# Patient Record
Sex: Male | Born: 1935 | Race: White | Hispanic: No | Marital: Married | Smoking: Never smoker
Health system: Southern US, Community
[De-identification: ages and names within clinical notes are randomized; demographics above are authoritative.]

## PROBLEM LIST (undated history)

## (undated) DIAGNOSIS — F329 Major depressive disorder, single episode, unspecified: Secondary | ICD-10-CM

## (undated) DIAGNOSIS — R81 Glycosuria: Secondary | ICD-10-CM

## (undated) DIAGNOSIS — G250 Essential tremor: Secondary | ICD-10-CM

## (undated) DIAGNOSIS — R413 Other amnesia: Secondary | ICD-10-CM

## (undated) DIAGNOSIS — I619 Nontraumatic intracerebral hemorrhage, unspecified: Secondary | ICD-10-CM

## (undated) DIAGNOSIS — F32A Depression, unspecified: Secondary | ICD-10-CM

## (undated) DIAGNOSIS — M5416 Radiculopathy, lumbar region: Secondary | ICD-10-CM

## (undated) DIAGNOSIS — R8290 Unspecified abnormal findings in urine: Secondary | ICD-10-CM

## (undated) DIAGNOSIS — R269 Unspecified abnormalities of gait and mobility: Secondary | ICD-10-CM

## (undated) DIAGNOSIS — M1990 Unspecified osteoarthritis, unspecified site: Secondary | ICD-10-CM

## (undated) DIAGNOSIS — F0390 Unspecified dementia without behavioral disturbance: Secondary | ICD-10-CM

## (undated) DIAGNOSIS — I739 Peripheral vascular disease, unspecified: Secondary | ICD-10-CM

## (undated) DIAGNOSIS — E785 Hyperlipidemia, unspecified: Secondary | ICD-10-CM

## (undated) DIAGNOSIS — H4920 Sixth [abducent] nerve palsy, unspecified eye: Secondary | ICD-10-CM

## (undated) DIAGNOSIS — H532 Diplopia: Secondary | ICD-10-CM

## (undated) DIAGNOSIS — M47812 Spondylosis without myelopathy or radiculopathy, cervical region: Secondary | ICD-10-CM

## (undated) DIAGNOSIS — E119 Type 2 diabetes mellitus without complications: Secondary | ICD-10-CM

## (undated) DIAGNOSIS — I10 Essential (primary) hypertension: Secondary | ICD-10-CM

## (undated) DIAGNOSIS — G459 Transient cerebral ischemic attack, unspecified: Secondary | ICD-10-CM

## (undated) DIAGNOSIS — H9190 Unspecified hearing loss, unspecified ear: Secondary | ICD-10-CM

## (undated) DIAGNOSIS — K219 Gastro-esophageal reflux disease without esophagitis: Secondary | ICD-10-CM

## (undated) DIAGNOSIS — M47816 Spondylosis without myelopathy or radiculopathy, lumbar region: Secondary | ICD-10-CM

## (undated) DIAGNOSIS — R296 Repeated falls: Secondary | ICD-10-CM

## (undated) DIAGNOSIS — E0590 Thyrotoxicosis, unspecified without thyrotoxic crisis or storm: Secondary | ICD-10-CM

## (undated) HISTORY — DX: Nontraumatic intracerebral hemorrhage, unspecified: I61.9

## (undated) HISTORY — DX: Unspecified abnormalities of gait and mobility: R26.9

## (undated) HISTORY — DX: Thyrotoxicosis, unspecified without thyrotoxic crisis or storm: E05.90

## (undated) HISTORY — DX: Radiculopathy, lumbar region: M54.16

## (undated) HISTORY — DX: Peripheral vascular disease, unspecified: I73.9

## (undated) HISTORY — DX: Diplopia: H53.2

## (undated) HISTORY — DX: Hyperlipidemia, unspecified: E78.5

## (undated) HISTORY — PX: APPENDECTOMY: SHX54

## (undated) HISTORY — DX: Transient cerebral ischemic attack, unspecified: G45.9

## (undated) HISTORY — DX: Depression, unspecified: F32.A

## (undated) HISTORY — DX: Unspecified osteoarthritis, unspecified site: M19.90

## (undated) HISTORY — DX: Gastro-esophageal reflux disease without esophagitis: K21.9

## (undated) HISTORY — DX: Sixth (abducent) nerve palsy, unspecified eye: H49.20

## (undated) HISTORY — DX: Other amnesia: R41.3

## (undated) HISTORY — DX: Spondylosis without myelopathy or radiculopathy, cervical region: M47.812

## (undated) HISTORY — DX: Major depressive disorder, single episode, unspecified: F32.9

## (undated) HISTORY — DX: Essential tremor: G25.0

## (undated) HISTORY — DX: Glycosuria: R81

## (undated) HISTORY — PX: OTHER SURGICAL HISTORY: SHX169

## (undated) HISTORY — DX: Repeated falls: R29.6

## (undated) HISTORY — PX: KNEE ARTHROSCOPY: SUR90

## (undated) HISTORY — DX: Essential (primary) hypertension: I10

## (undated) HISTORY — DX: Spondylosis without myelopathy or radiculopathy, lumbar region: M47.816

## (undated) HISTORY — DX: Unspecified hearing loss, unspecified ear: H91.90

## (undated) HISTORY — PX: TONSILLECTOMY: SHX5217

## (undated) HISTORY — DX: Unspecified abnormal findings in urine: R82.90

## (undated) NOTE — *Deleted (*Deleted)
Pt sent from SNF to have foley placed. 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

---
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
## 1998-06-09 ENCOUNTER — Ambulatory Visit (HOSPITAL_COMMUNITY): Admission: RE | Admit: 1998-06-09 | Discharge: 1998-06-09 | Payer: Self-pay | Admitting: Neurology

## 1998-07-20 ENCOUNTER — Ambulatory Visit (HOSPITAL_COMMUNITY): Admission: RE | Admit: 1998-07-20 | Discharge: 1998-07-20 | Payer: Self-pay | Admitting: Neurosurgery

## 1998-07-20 ENCOUNTER — Encounter: Payer: Self-pay | Admitting: Neurology

## 1998-12-30 ENCOUNTER — Ambulatory Visit (HOSPITAL_COMMUNITY): Admission: RE | Admit: 1998-12-30 | Discharge: 1998-12-30 | Payer: Self-pay | Admitting: Gastroenterology

## 2000-02-02 ENCOUNTER — Ambulatory Visit (HOSPITAL_COMMUNITY): Admission: RE | Admit: 2000-02-02 | Discharge: 2000-02-02 | Payer: Self-pay | Admitting: Neurology

## 2000-02-02 ENCOUNTER — Encounter: Payer: Self-pay | Admitting: Neurology

## 2000-06-13 ENCOUNTER — Encounter: Payer: Self-pay | Admitting: Neurology

## 2000-06-13 ENCOUNTER — Ambulatory Visit (HOSPITAL_COMMUNITY): Admission: RE | Admit: 2000-06-13 | Discharge: 2000-06-13 | Payer: Self-pay | Admitting: Neurology

## 2000-06-28 ENCOUNTER — Encounter: Admission: RE | Admit: 2000-06-28 | Discharge: 2000-07-12 | Payer: Self-pay | Admitting: Neurology

## 2000-07-16 ENCOUNTER — Encounter: Admission: RE | Admit: 2000-07-16 | Discharge: 2000-10-14 | Payer: Self-pay | Admitting: Neurology

## 2000-10-17 ENCOUNTER — Encounter: Admission: RE | Admit: 2000-10-17 | Discharge: 2000-12-10 | Payer: Self-pay | Admitting: Neurology

## 2000-11-05 ENCOUNTER — Encounter: Admission: RE | Admit: 2000-11-05 | Discharge: 2000-12-10 | Payer: Self-pay | Admitting: Neurology

## 2000-12-19 ENCOUNTER — Other Ambulatory Visit: Admission: RE | Admit: 2000-12-19 | Discharge: 2000-12-19 | Payer: Self-pay | Admitting: Urology

## 2001-10-30 ENCOUNTER — Encounter: Payer: Self-pay | Admitting: Internal Medicine

## 2001-10-30 ENCOUNTER — Encounter: Admission: RE | Admit: 2001-10-30 | Discharge: 2001-10-30 | Payer: Self-pay | Admitting: Internal Medicine

## 2001-11-28 ENCOUNTER — Observation Stay (HOSPITAL_COMMUNITY): Admission: RE | Admit: 2001-11-28 | Discharge: 2001-11-29 | Payer: Self-pay | Admitting: Surgery

## 2002-09-23 ENCOUNTER — Ambulatory Visit (HOSPITAL_COMMUNITY): Admission: RE | Admit: 2002-09-23 | Discharge: 2002-09-23 | Payer: Self-pay | Admitting: Neurology

## 2002-09-23 ENCOUNTER — Encounter: Payer: Self-pay | Admitting: Neurology

## 2006-02-14 ENCOUNTER — Encounter: Payer: Self-pay | Admitting: Neurology

## 2006-10-16 HISTORY — PX: GALLBLADDER SURGERY: SHX652

## 2006-11-21 ENCOUNTER — Ambulatory Visit: Payer: Self-pay | Admitting: Cardiology

## 2006-11-21 ENCOUNTER — Inpatient Hospital Stay (HOSPITAL_COMMUNITY): Admission: EM | Admit: 2006-11-21 | Discharge: 2006-11-23 | Payer: Self-pay | Admitting: Emergency Medicine

## 2006-11-21 ENCOUNTER — Ambulatory Visit: Payer: Self-pay | Admitting: *Deleted

## 2007-01-15 ENCOUNTER — Inpatient Hospital Stay (HOSPITAL_COMMUNITY): Admission: EM | Admit: 2007-01-15 | Discharge: 2007-01-16 | Payer: Self-pay | Admitting: Emergency Medicine

## 2013-04-06 ENCOUNTER — Other Ambulatory Visit: Payer: Self-pay

## 2013-04-06 MED ORDER — MIRTAZAPINE 30 MG PO TABS: 30.0000 mg | ORAL_TABLET | Freq: Every day | ORAL | Status: DC

## 2013-04-06 NOTE — Telephone Encounter (Signed)
Former [facility] patient assigned to Dr Athar.  

## 2013-04-25 ENCOUNTER — Telehealth: Payer: Self-pay | Admitting: Neurology

## 2013-04-29 ENCOUNTER — Ambulatory Visit: Payer: Self-pay | Admitting: Neurology

## 2013-05-16 ENCOUNTER — Ambulatory Visit: Payer: Self-pay | Admitting: Neurology

## 2013-08-06 ENCOUNTER — Other Ambulatory Visit: Payer: Self-pay | Admitting: Dermatology

## 2013-11-13 ENCOUNTER — Telehealth: Payer: Self-pay | Admitting: Neurology

## 2013-11-13 NOTE — Telephone Encounter (Signed)
i answered the patient questions

## 2013-11-24 ENCOUNTER — Encounter: Payer: Self-pay | Admitting: Neurology

## 2013-11-27 ENCOUNTER — Encounter: Payer: Self-pay | Admitting: Neurology

## 2013-11-27 ENCOUNTER — Ambulatory Visit (INDEPENDENT_AMBULATORY_CARE_PROVIDER_SITE_OTHER): Payer: Medicare Other | Admitting: Neurology

## 2013-11-27 VITALS — BP 187/77 | HR 71 | Wt 180.0 lb

## 2013-11-27 DIAGNOSIS — R269 Unspecified abnormalities of gait and mobility: Secondary | ICD-10-CM | POA: Insufficient documentation

## 2013-11-27 MED ORDER — CLOPIDOGREL BISULFATE 75 MG PO TABS: 75.0000 mg | ORAL_TABLET | Freq: Every day | ORAL | Status: DC

## 2013-11-27 MED ORDER — MIRTAZAPINE 30 MG PO TABS: 30.0000 mg | ORAL_TABLET | Freq: Every day | ORAL | Status: DC

## 2013-11-27 NOTE — Progress Notes (Signed)
Reason for visit: Gait disorder  Kenneth Stark is an 80 y.o. male  History of present illness:  Kenneth Stark is an 80 year old right-handed white male with a history of cerebrovascular disease, history of TIA, gait disorder, cervical and lumbosacral spondylosis. The patient has done quite well over the last year, last seen by Dr. Erling Cruz. The patient is on Plavix, and he takes mirtazapine at night. The patient has been given some prednisone, cyclobenzaprine, and diclofenac to take if his neck or back flares up, but he has not had to use these medications in over 2 or 3 years. The patient has had some knee arthritis, and he walks with a cane. The patient feels at times that he might veer to the left side. The patient has not had any significant issues with balance until 6 weeks ago when he was carrying a large box with both hands, and then fell backwards. The patient fortunately did not sustain significant injury. The patient returns to this office for an evaluation.  Past Medical History  Diagnosis Date  . Sixth nerve palsy     last  left brain 11/2006  02/1998 08/2002  . Cervical spondylosis   . Degenerative joint disease (DJD) of lumbar spine   . Small vessel disease   . Essential tremor   . Arthritis   . Diplopia   . Abnormality of gait   . Hearing difficulty     hearing aids  . Hypertension   . Dyslipidemia     Past Surgical History  Procedure Laterality Date  . Knee arthroscopy Left     Dr. Hart Robinsons 2002  . Knee injections Right     Dr. Adriana Mccallum    Family History  Problem Relation Age of Onset  . Stroke Mother   . Stroke Father   . Heart disease Father   . Dementia Brother   . Renal Disease Brother   . Renal Disease Daughter     Social history:  reports that he has never smoked. He has never used smokeless tobacco. He reports that he drinks alcohol. He reports that he does not use illicit drugs.   No Known Allergies  Medications:  No current outpatient  prescriptions on file prior to visit.   No current facility-administered medications on file prior to visit.    ROS:  Out of a complete 14 system review of symptoms, the patient complains only of the following symptoms, and all other reviewed systems are negative.  Neck pain, hearing loss Headache Low back pain, walking difficulties Daytime drowsiness, snoring  Blood pressure 187/77, pulse 71, weight 180 lb (81.647 kg).  Physical Exam  General: The patient is alert and cooperative at the time of the examination.  Skin: No significant peripheral edema is noted.   Neurologic Exam  Mental status: The patient is oriented x 3.  Cranial nerves: Facial symmetry is present. Speech is normal, no aphasia or dysarthria is noted. Extraocular movements are full. Visual fields are full.  Motor: The patient has good strength in all 4 extremities.  Sensory examination: Soft touch sensation is symmetric on the face, arms, and legs.  Coordination: The patient has good finger-nose-finger and heel-to-shin bilaterally.  Gait and station: The patient has a slightly wide-based, unsteady gait. The patient uses a cane for ambulation. Tandem gait is unsteady. Romberg is negative. No drift is seen.  Reflexes: Deep tendon reflexes are symmetric.   Assessment/Plan:  1. Gait disorder  2. Cerebrovascular disease  3.  Lumbosacral and cervical spondylosis  The patient will continue on the Plavix and mirtazapine, and prescriptions were given for this. The patient will need to continue to use his cane. The patient will followup in one year. The patient will contact our office if needed.  Jill Alexanders MD 11/27/2013 7:37 PM  [facility] [address] [place], [place] [address]  Phone [phone] Fax [phone]

## 2013-11-27 NOTE — Patient Instructions (Signed)

## 2014-05-14 ENCOUNTER — Telehealth: Payer: Self-pay | Admitting: *Deleted

## 2014-05-14 NOTE — Telephone Encounter (Signed)
Patient returning call, rescheduled appointment to be with [facility]. Patient verbalized understanding.

## 2014-05-14 NOTE — Telephone Encounter (Signed)
Called patient to r/s appointment time on 11/27/14 with NP CM, patient should be r/s with NP MM due to NP CM has never seen patient, left message for patient to return the call to r/s appointment.

## 2014-09-02 ENCOUNTER — Encounter: Payer: Self-pay | Admitting: Neurology

## 2014-09-08 ENCOUNTER — Encounter: Payer: Self-pay | Admitting: Neurology

## 2014-09-22 ENCOUNTER — Other Ambulatory Visit: Payer: Self-pay | Admitting: Internal Medicine

## 2014-09-22 DIAGNOSIS — R634 Abnormal weight loss: Secondary | ICD-10-CM

## 2014-09-22 DIAGNOSIS — R413 Other amnesia: Secondary | ICD-10-CM

## 2014-09-30 ENCOUNTER — Ambulatory Visit
Admission: RE | Admit: 2014-09-30 | Discharge: 2014-09-30 | Disposition: A | Discharge status: Home / Self Care | Payer: Medicare Other | Source: Ambulatory Visit | Attending: Internal Medicine | Admitting: Internal Medicine

## 2014-09-30 DIAGNOSIS — R634 Abnormal weight loss: Secondary | ICD-10-CM

## 2014-09-30 DIAGNOSIS — R413 Other amnesia: Secondary | ICD-10-CM

## 2014-09-30 MED ORDER — GADOBENATE DIMEGLUMINE 529 MG/ML IV SOLN
15.0000 mL | Freq: Once | INTRAVENOUS | Status: AC | PRN
  Administered 2014-09-30: 15 mL via INTRAVENOUS

## 2014-11-27 ENCOUNTER — Ambulatory Visit: Payer: Medicare Other | Admitting: Nurse Practitioner

## 2014-11-27 ENCOUNTER — Ambulatory Visit: Payer: Medicare Other | Admitting: Adult Health

## 2014-11-28 ENCOUNTER — Other Ambulatory Visit: Payer: Self-pay | Admitting: Neurology

## 2014-12-19 ENCOUNTER — Other Ambulatory Visit: Payer: Self-pay | Admitting: Neurology

## 2014-12-23 NOTE — Telephone Encounter (Signed)
The clinic was supposed to be doing refills, however this one did not get completed by them.

## 2015-03-18 LAB — LIPID PANEL
CHOLESTEROL: 148 mg/dL (ref 0–200)
HDL: 48 mg/dL (ref 35–70)
LDL CALC: 76 mg/dL
TRIGLYCERIDES: 121 mg/dL (ref 40–160)

## 2015-03-18 LAB — BASIC METABOLIC PANEL
BUN: 21 mg/dL (ref 4–21)
Creatinine: 0.8 mg/dL (ref 0.6–1.3)
Glucose: 122 mg/dL
Potassium: 3.6 mmol/L (ref 3.4–5.3)
Sodium: 142 mmol/L (ref 137–147)

## 2015-03-18 LAB — HEPATIC FUNCTION PANEL
ALK PHOS: 47 U/L (ref 25–125)
ALT: 20 U/L (ref 10–40)
AST: 22 U/L (ref 14–40)
BILIRUBIN, TOTAL: 0.6 mg/dL

## 2015-03-18 LAB — CBC AND DIFFERENTIAL
HCT: 41 % (ref 41–53)
Hemoglobin: 13.6 g/dL (ref 13.5–17.5)
PLATELETS: 325 10*3/uL (ref 150–399)
WBC: 5.8 10*3/mL

## 2015-03-18 LAB — HEMOGLOBIN A1C: Hemoglobin A1C: 6.4

## 2015-03-18 LAB — TSH: TSH: 2.23 u[IU]/mL (ref 0.41–5.90)

## 2015-03-26 LAB — IFOBT (OCCULT BLOOD): IMMUNOLOGICAL FECAL OCCULT BLOOD TEST: NEGATIVE

## 2015-08-11 NOTE — Telephone Encounter (Signed)
Error

## 2015-12-27 LAB — BASIC METABOLIC PANEL
BUN: 22 mg/dL — AB (ref 4–21)
CREATININE: 1 mg/dL (ref 0.6–1.3)
GLUCOSE: 132 mg/dL
POTASSIUM: 3.4 mmol/L (ref 3.4–5.3)
SODIUM: 137 mmol/L (ref 137–147)

## 2015-12-27 LAB — CBC AND DIFFERENTIAL
HCT: 39 % — AB (ref 41–53)
HEMOGLOBIN: 13.1 g/dL — AB (ref 13.5–17.5)
Platelets: 228 10*3/uL (ref 150–399)
WBC: 7 10^3/mL

## 2016-03-10 LAB — HEPATIC FUNCTION PANEL
ALT: 22 U/L (ref 10–40)
AST: 18 U/L (ref 14–40)
Alkaline Phosphatase: 65 U/L (ref 25–125)
BILIRUBIN, TOTAL: 0.6 mg/dL

## 2016-03-10 LAB — TSH: TSH: 1.57 u[IU]/mL (ref 0.41–5.90)

## 2016-03-10 LAB — BASIC METABOLIC PANEL
BUN: 20 mg/dL (ref 4–21)
Creatinine: 0.9 mg/dL (ref 0.6–1.3)
GLUCOSE: 124 mg/dL
POTASSIUM: 3.5 mmol/L (ref 3.4–5.3)
SODIUM: 142 mmol/L (ref 137–147)

## 2016-03-10 LAB — LIPID PANEL
Cholesterol: 124 mg/dL (ref 0–200)
HDL: 45 mg/dL (ref 35–70)
LDL CALC: 64 mg/dL
Triglycerides: 76 mg/dL (ref 40–160)

## 2016-03-10 LAB — CBC AND DIFFERENTIAL
HEMATOCRIT: 40 % — AB (ref 41–53)
Hemoglobin: 13.3 g/dL — AB (ref 13.5–17.5)
Platelets: 325 10*3/uL (ref 150–399)
WBC: 6.6 10*3/mL

## 2016-03-10 LAB — HEMOGLOBIN A1C: Hemoglobin A1C: 6.8

## 2016-03-22 LAB — IFOBT (OCCULT BLOOD): IFOBT: NEGATIVE

## 2016-04-14 ENCOUNTER — Other Ambulatory Visit: Payer: Self-pay

## 2016-04-14 NOTE — Telephone Encounter (Signed)
erogeneous encounter

## 2016-09-01 ENCOUNTER — Emergency Department (HOSPITAL_COMMUNITY): Payer: Medicare Other

## 2016-09-01 ENCOUNTER — Inpatient Hospital Stay (HOSPITAL_COMMUNITY)
Admission: EM | Admit: 2016-09-01 | Discharge: 2016-09-03 | DRG: 087 | Disposition: A | Discharge status: Skilled Nursing Facility | Payer: Medicare Other | Attending: Internal Medicine | Admitting: Internal Medicine

## 2016-09-01 ENCOUNTER — Encounter (HOSPITAL_COMMUNITY): Payer: Self-pay | Admitting: *Deleted

## 2016-09-01 DIAGNOSIS — M5136 Other intervertebral disc degeneration, lumbar region: Secondary | ICD-10-CM | POA: Diagnosis present

## 2016-09-01 DIAGNOSIS — Z7902 Long term (current) use of antithrombotics/antiplatelets: Secondary | ICD-10-CM

## 2016-09-01 DIAGNOSIS — I679 Cerebrovascular disease, unspecified: Secondary | ICD-10-CM | POA: Diagnosis not present

## 2016-09-01 DIAGNOSIS — E785 Hyperlipidemia, unspecified: Secondary | ICD-10-CM | POA: Diagnosis present

## 2016-09-01 DIAGNOSIS — E876 Hypokalemia: Secondary | ICD-10-CM | POA: Diagnosis present

## 2016-09-01 DIAGNOSIS — S065X9A Traumatic subdural hemorrhage with loss of consciousness of unspecified duration, initial encounter: Secondary | ICD-10-CM

## 2016-09-01 DIAGNOSIS — M47812 Spondylosis without myelopathy or radiculopathy, cervical region: Secondary | ICD-10-CM | POA: Diagnosis present

## 2016-09-01 DIAGNOSIS — S01322A Laceration with foreign body of left ear, initial encounter: Secondary | ICD-10-CM | POA: Diagnosis not present

## 2016-09-01 DIAGNOSIS — W010XXA Fall on same level from slipping, tripping and stumbling without subsequent striking against object, initial encounter: Secondary | ICD-10-CM | POA: Diagnosis present

## 2016-09-01 DIAGNOSIS — I6200 Nontraumatic subdural hemorrhage, unspecified: Secondary | ICD-10-CM | POA: Diagnosis not present

## 2016-09-01 DIAGNOSIS — M1990 Unspecified osteoarthritis, unspecified site: Secondary | ICD-10-CM | POA: Diagnosis not present

## 2016-09-01 DIAGNOSIS — G250 Essential tremor: Secondary | ICD-10-CM | POA: Diagnosis present

## 2016-09-01 DIAGNOSIS — H9190 Unspecified hearing loss, unspecified ear: Secondary | ICD-10-CM | POA: Diagnosis present

## 2016-09-01 DIAGNOSIS — D649 Anemia, unspecified: Secondary | ICD-10-CM | POA: Diagnosis present

## 2016-09-01 DIAGNOSIS — H4920 Sixth [abducent] nerve palsy, unspecified eye: Secondary | ICD-10-CM | POA: Diagnosis present

## 2016-09-01 DIAGNOSIS — Z7982 Long term (current) use of aspirin: Secondary | ICD-10-CM

## 2016-09-01 DIAGNOSIS — Z841 Family history of disorders of kidney and ureter: Secondary | ICD-10-CM

## 2016-09-01 DIAGNOSIS — I609 Nontraumatic subarachnoid hemorrhage, unspecified: Secondary | ICD-10-CM

## 2016-09-01 DIAGNOSIS — S065X0A Traumatic subdural hemorrhage without loss of consciousness, initial encounter: Principal | ICD-10-CM | POA: Diagnosis present

## 2016-09-01 DIAGNOSIS — S01312A Laceration without foreign body of left ear, initial encounter: Secondary | ICD-10-CM | POA: Diagnosis not present

## 2016-09-01 DIAGNOSIS — Z823 Family history of stroke: Secondary | ICD-10-CM

## 2016-09-01 DIAGNOSIS — F0390 Unspecified dementia without behavioral disturbance: Secondary | ICD-10-CM | POA: Diagnosis present

## 2016-09-01 DIAGNOSIS — I2510 Atherosclerotic heart disease of native coronary artery without angina pectoris: Secondary | ICD-10-CM | POA: Diagnosis present

## 2016-09-01 DIAGNOSIS — S066X0A Traumatic subarachnoid hemorrhage without loss of consciousness, initial encounter: Secondary | ICD-10-CM | POA: Diagnosis present

## 2016-09-01 DIAGNOSIS — S065XAA Traumatic subdural hemorrhage with loss of consciousness status unknown, initial encounter: Secondary | ICD-10-CM | POA: Diagnosis present

## 2016-09-01 DIAGNOSIS — I619 Nontraumatic intracerebral hemorrhage, unspecified: Secondary | ICD-10-CM

## 2016-09-01 DIAGNOSIS — Z23 Encounter for immunization: Secondary | ICD-10-CM

## 2016-09-01 DIAGNOSIS — Z8249 Family history of ischemic heart disease and other diseases of the circulatory system: Secondary | ICD-10-CM

## 2016-09-01 DIAGNOSIS — M47816 Spondylosis without myelopathy or radiculopathy, lumbar region: Secondary | ICD-10-CM | POA: Insufficient documentation

## 2016-09-01 DIAGNOSIS — Z8673 Personal history of transient ischemic attack (TIA), and cerebral infarction without residual deficits: Secondary | ICD-10-CM

## 2016-09-01 DIAGNOSIS — I10 Essential (primary) hypertension: Secondary | ICD-10-CM | POA: Diagnosis present

## 2016-09-01 HISTORY — DX: Nontraumatic intracerebral hemorrhage, unspecified: I61.9

## 2016-09-01 LAB — CBC WITH DIFFERENTIAL/PLATELET
Basophils Absolute: 0 10*3/uL (ref 0.0–0.1)
Basophils Relative: 0 %
EOS PCT: 0 %
Eosinophils Absolute: 0 10*3/uL (ref 0.0–0.7)
HCT: 35.8 % — ABNORMAL LOW (ref 39.0–52.0)
Hemoglobin: 12 g/dL — ABNORMAL LOW (ref 13.0–17.0)
LYMPHS ABS: 1 10*3/uL (ref 0.7–4.0)
Lymphocytes Relative: 6 %
MCH: 30.8 pg (ref 26.0–34.0)
MCHC: 33.5 g/dL (ref 30.0–36.0)
MCV: 92 fL (ref 78.0–100.0)
MONO ABS: 1.2 10*3/uL — AB (ref 0.1–1.0)
Monocytes Relative: 7 %
Neutro Abs: 15.2 10*3/uL — ABNORMAL HIGH (ref 1.7–7.7)
Neutrophils Relative %: 87 %
PLATELETS: 220 10*3/uL (ref 150–400)
RBC: 3.89 MIL/uL — AB (ref 4.22–5.81)
RDW: 14.5 % (ref 11.5–15.5)
WBC: 17.4 10*3/uL — AB (ref 4.0–10.5)

## 2016-09-01 LAB — CBC
HCT: 29.5 % — ABNORMAL LOW (ref 39.0–52.0)
Hemoglobin: 9.9 g/dL — ABNORMAL LOW (ref 13.0–17.0)
MCH: 30.7 pg (ref 26.0–34.0)
MCHC: 33.6 g/dL (ref 30.0–36.0)
MCV: 91.3 fL (ref 78.0–100.0)
PLATELETS: 247 10*3/uL (ref 150–400)
RBC: 3.23 MIL/uL — ABNORMAL LOW (ref 4.22–5.81)
RDW: 14.3 % (ref 11.5–15.5)
WBC: 9.7 10*3/uL (ref 4.0–10.5)

## 2016-09-01 LAB — BASIC METABOLIC PANEL
Anion gap: 10 (ref 5–15)
BUN: 21 mg/dL — AB (ref 6–20)
CALCIUM: 9.7 mg/dL (ref 8.9–10.3)
CO2: 24 mmol/L (ref 22–32)
CREATININE: 0.94 mg/dL (ref 0.61–1.24)
Chloride: 104 mmol/L (ref 101–111)
GFR calc Af Amer: >60 mL/min (ref >60)
GFR calc non Af Amer: >60 mL/min (ref >60)
Glucose, Bld: 210 mg/dL — ABNORMAL HIGH (ref 65–99)
Potassium: 3.1 mmol/L — ABNORMAL LOW (ref 3.5–5.1)
SODIUM: 138 mmol/L (ref 135–145)

## 2016-09-01 LAB — PROTIME-INR
INR: 1.07
Prothrombin Time: 14 seconds (ref 11.4–15.2)

## 2016-09-01 LAB — MRSA PCR SCREENING: MRSA by PCR: NEGATIVE

## 2016-09-01 LAB — APTT: APTT: 26 s (ref 24–36)

## 2016-09-01 MED ORDER — OMEGA-3-ACID ETHYL ESTERS 1 G PO CAPS
1.0000 g | ORAL_CAPSULE | Freq: Two times a day (BID) | ORAL | Status: DC
  Administered 2016-09-01 – 2016-09-03 (×4): 1 g via ORAL
  Filled 2016-09-01 (×4): qty 1

## 2016-09-01 MED ORDER — ONDANSETRON HCL 4 MG PO TABS: 4.0000 mg | ORAL_TABLET | Freq: Four times a day (QID) | ORAL | Status: DC | PRN

## 2016-09-01 MED ORDER — HYDROCHLOROTHIAZIDE 25 MG PO TABS
25.0000 mg | ORAL_TABLET | Freq: Every day | ORAL | Status: DC
  Administered 2016-09-02 – 2016-09-03 (×2): 25 mg via ORAL
  Filled 2016-09-01 (×2): qty 1

## 2016-09-01 MED ORDER — ONDANSETRON HCL 4 MG/2ML IJ SOLN: 4.0000 mg | Freq: Four times a day (QID) | INTRAMUSCULAR | Status: DC | PRN

## 2016-09-01 MED ORDER — MEMANTINE HCL-DONEPEZIL HCL ER 28-10 MG PO CP24: 1.0000 | ORAL_CAPSULE | Freq: Every evening | ORAL | Status: DC

## 2016-09-01 MED ORDER — AMLODIPINE BESYLATE 10 MG PO TABS
10.0000 mg | ORAL_TABLET | Freq: Every day | ORAL | Status: DC
  Administered 2016-09-02 – 2016-09-03 (×2): 10 mg via ORAL
  Filled 2016-09-01 (×2): qty 1

## 2016-09-01 MED ORDER — SAW PALMETTO (SERENOA REPENS) 500 MG PO CAPS: 500.0000 mg | ORAL_CAPSULE | Freq: Every day | ORAL | Status: DC

## 2016-09-01 MED ORDER — ESCITALOPRAM OXALATE 10 MG PO TABS
10.0000 mg | ORAL_TABLET | Freq: Every day | ORAL | Status: DC
  Administered 2016-09-01 – 2016-09-03 (×3): 10 mg via ORAL
  Filled 2016-09-01 (×3): qty 1

## 2016-09-01 MED ORDER — DONEPEZIL HCL 10 MG PO TABS
10.0000 mg | ORAL_TABLET | Freq: Every day | ORAL | Status: DC
  Administered 2016-09-01 – 2016-09-02 (×2): 10 mg via ORAL
  Filled 2016-09-01 (×2): qty 1

## 2016-09-01 MED ORDER — POTASSIUM CHLORIDE ER 10 MEQ PO TBCR
10.0000 meq | EXTENDED_RELEASE_TABLET | Freq: Every day | ORAL | Status: DC
  Administered 2016-09-02 – 2016-09-03 (×2): 10 meq via ORAL
  Filled 2016-09-01 (×4): qty 1

## 2016-09-01 MED ORDER — POLYVINYL ALCOHOL 1.4 % OP SOLN
1.0000 [drp] | OPHTHALMIC | Status: DC | PRN
  Filled 2016-09-01: qty 15

## 2016-09-01 MED ORDER — SODIUM CHLORIDE 0.9 % IV SOLN
10.0000 mL/h | Freq: Once | INTRAVENOUS | Status: AC
  Administered 2016-09-01: 10 mL/h via INTRAVENOUS

## 2016-09-01 MED ORDER — ACETAMINOPHEN 650 MG RE SUPP
650.0000 mg | Freq: Four times a day (QID) | RECTAL | Status: DC | PRN
Start: 2016-09-01 — End: 2016-09-03

## 2016-09-01 MED ORDER — OMEGA 3 1000 MG PO CAPS: 1.0000 | ORAL_CAPSULE | Freq: Two times a day (BID) | ORAL | Status: DC

## 2016-09-01 MED ORDER — SODIUM CHLORIDE 0.9 % IV SOLN: Freq: Once | INTRAVENOUS | Status: DC

## 2016-09-01 MED ORDER — EYE LUBRICANT OP OINT: 1.0000 [drp] | TOPICAL_OINTMENT | Freq: Every day | OPHTHALMIC | Status: DC

## 2016-09-01 MED ORDER — MEMANTINE HCL ER 28 MG PO CP24
28.0000 mg | ORAL_CAPSULE | Freq: Every day | ORAL | Status: DC
  Administered 2016-09-01 – 2016-09-02 (×2): 28 mg via ORAL
  Filled 2016-09-01 (×2): qty 1

## 2016-09-01 MED ORDER — TETANUS-DIPHTH-ACELL PERTUSSIS 5-2.5-18.5 LF-MCG/0.5 IM SUSP
0.5000 mL | Freq: Once | INTRAMUSCULAR | Status: AC
  Administered 2016-09-01: 0.5 mL via INTRAMUSCULAR
  Filled 2016-09-01: qty 0.5

## 2016-09-01 MED ORDER — POLYETHYLENE GLYCOL 3350 17 G PO PACK
17.0000 g | PACK | Freq: Every day | ORAL | Status: DC | PRN
  Administered 2016-09-03: 17 g via ORAL
  Filled 2016-09-01: qty 1

## 2016-09-01 MED ORDER — IBUPROFEN 200 MG PO TABS
600.0000 mg | ORAL_TABLET | Freq: Four times a day (QID) | ORAL | Status: DC | PRN
  Administered 2016-09-03: 600 mg via ORAL
  Filled 2016-09-01: qty 3

## 2016-09-01 MED ORDER — ACETAMINOPHEN 325 MG PO TABS
650.0000 mg | ORAL_TABLET | Freq: Four times a day (QID) | ORAL | Status: DC | PRN
  Administered 2016-09-02 – 2016-09-03 (×2): 650 mg via ORAL
  Filled 2016-09-01 (×2): qty 2

## 2016-09-01 MED ORDER — IRBESARTAN 300 MG PO TABS
300.0000 mg | ORAL_TABLET | Freq: Every day | ORAL | Status: DC
  Administered 2016-09-02 – 2016-09-03 (×2): 300 mg via ORAL
  Filled 2016-09-01 (×2): qty 1

## 2016-09-01 MED ORDER — TRIAMCINOLONE ACETONIDE 55 MCG/ACT NA AERO
2.0000 | INHALATION_SPRAY | Freq: Every day | NASAL | Status: DC
  Administered 2016-09-01 – 2016-09-02 (×2): 2 via NASAL
  Filled 2016-09-01: qty 21.6

## 2016-09-01 MED ORDER — PRAVASTATIN SODIUM 20 MG PO TABS
20.0000 mg | ORAL_TABLET | Freq: Every day | ORAL | Status: DC
  Administered 2016-09-01 – 2016-09-02 (×2): 20 mg via ORAL
  Filled 2016-09-01 (×2): qty 1

## 2016-09-01 MED ORDER — MIRTAZAPINE 15 MG PO TABS
30.0000 mg | ORAL_TABLET | Freq: Every day | ORAL | Status: DC
  Administered 2016-09-01 – 2016-09-02 (×2): 30 mg via ORAL
  Filled 2016-09-01 (×2): qty 2

## 2016-09-01 MED ORDER — LIDOCAINE-EPINEPHRINE 1 %-1:100000 IJ SOLN
20.0000 mL | Freq: Once | INTRAMUSCULAR | Status: DC
  Filled 2016-09-01: qty 1

## 2016-09-01 MED ORDER — TAMSULOSIN HCL 0.4 MG PO CAPS
0.4000 mg | ORAL_CAPSULE | Freq: Every day | ORAL | Status: DC
  Administered 2016-09-02: 0.4 mg via ORAL
  Filled 2016-09-01: qty 1

## 2016-09-01 NOTE — Consult Note (Signed)
Discussed case and reviewed films. Unlikely to require surgery Admit to hospitalist, preferably stepdown unit Give two units of platelets Repeat CT in AM Full note to follow

## 2016-09-01 NOTE — ED Provider Notes (Signed)
[name] DEPT Provider Note   CSN: [id number] Arrival date & time: 09/01/16  1012     History   Chief Complaint Chief Complaint  Patient presents with  . Fall    HPI Kenneth Stark is a 82 y.o. male.  The history is provided by the patient and medical records. No language interpreter was used.  Fall  Pertinent negatives include no abdominal pain, no headaches and no shortness of breath.   Kenneth Stark is a 82 y.o. male  with a PMH of 6th nerve palsy, HTN, HLD, CAD on Plavix who presents to the Emergency Department for evaluation of fall which occurred just prior to arrival. Patient states that he was going to get into his car when he tripped and fell, hitting the left aspect of his head. No loss of consciousness. Complaining of left ear pain and mild neck pain. Denies headache, back pain, abdominal pain, upper or lower extremity pain, numbness or weakness. + wound to left ear which was wrapped by prior to arrival. No medications prior to arrival for symptoms. Unsure of tetanus status.  Past Medical History:  Diagnosis Date  . Abnormality of gait   . Arthritis   . Cervical spondylosis   . Degenerative joint disease (DJD) of lumbar spine   . Diplopia   . Dyslipidemia   . Essential tremor   . Hearing difficulty    hearing aids  . Hypertension   . 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 Small vessel disease     Patient Active Problem List   Diagnosis Date Noted  . Subdural hematoma ([place]) 09/01/2016  . Complex laceration of left ear 09/01/2016  . Cerebrovascular disease 09/01/2016  . History of TIA (transient ischemic attack) 09/01/2016  . Essential hypertension   . Sixth nerve palsy   . Cervical spondylosis   . Degenerative joint disease (DJD) of lumbar spine   . Arthritis   . Dyslipidemia   . Essential tremor   . Abnormality of gait 11/27/2013    Past Surgical History:  Procedure Laterality Date  . KNEE ARTHROSCOPY Left     Dr. Hart Robinsons 2002  . knee injections Right    Dr. Adriana Mccallum       Home Medications    Prior to Admission medications   Medication Sig Start Date End Date Taking? Authorizing Provider  amLODipine (NORVASC) 10 MG tablet Take 10 mg by mouth daily.   Yes Historical Provider, MD  Artificial Tear Ointment (EYE LUBRICANT OP) Place 1 drop into both eyes daily.   Yes Historical Provider, MD  Ascorbic Acid (VITAMIN C) 1000 MG tablet Take 1,000 mg by mouth 3 (three) times daily.    Yes Historical Provider, MD  aspirin 81 MG tablet Take 81 mg by mouth daily.   Yes Historical Provider, MD  Calcium Carbonate-Vitamin D (CALTRATE 600+D PO) Take 600 mg by mouth 2 (two) times daily.    Yes Historical Provider, MD  clopidogrel (PLAVIX) 75 MG tablet TAKE ONE TABLET BY MOUTH ONCE DAILY Patient taking differently: TAKE 75 MG BY MOUTH ONCE DAILY 12/23/14  Yes [name], MD  Co-Enzyme Q-10 100 MG CAPS Take 100 mg by mouth daily.   Yes Historical Provider, MD  escitalopram (LEXAPRO) 10 MG tablet Take 10 mg by mouth daily.   Yes Historical Provider, MD  hydrochlorothiazide (HYDRODIURIL) 25 MG tablet Take 25 mg by mouth daily.   Yes Historical Provider, MD  Lutein-Zeaxanthin 25-5 MG CAPS Take 1 capsule by mouth daily.   Yes Historical Provider, MD  Memantine HCl-Donepezil HCl (NAMZARIC) 28-10 MG CP24 Take 1 capsule by mouth every evening.   Yes Historical Provider, MD  mirtazapine (REMERON) 30 MG tablet TAKE ONE TABLET AT BEDTIME Patient taking differently: TAKE 30 MG BY MOUTH AT BEDTIME 12/23/14  Yes [name], MD  Misc Natural Products (GLUCOSAMINE CHOND COMPLEX/MSM PO) Take 1 tablet by mouth 3 (three) times daily.    Yes Historical Provider, MD  Multiple Vitamin (MULTIVITAMIN) tablet Take 1 tablet by mouth daily.   Yes Historical Provider, MD  Omega-3 Fatty Acids (OMEGA 3 PO) Take 1 capsule by mouth 2 (two) times daily.    Yes Historical Provider, MD  potassium chloride (K-DUR) 10 MEQ tablet  Take 10 mEq by mouth daily. 11/05/13  Yes Historical Provider, MD  pravastatin (PRAVACHOL) 20 MG tablet Take 20 mg by mouth daily. 11/05/13  Yes Historical Provider, MD  RAPAFLO 8 MG CAPS capsule Take 8 mg by mouth daily. 11/05/13  Yes Historical Provider, MD  saw palmetto 500 MG capsule Take 500 mg by mouth daily.   Yes Historical Provider, MD  telmisartan (MICARDIS) 80 MG tablet Take 80 mg by mouth daily. 11/05/13  Yes Historical Provider, MD  triamcinolone (NASACORT) 55 MCG/ACT AERO nasal inhaler Place 2 sprays into the nose at bedtime.   Yes Historical Provider, MD    Family History Family History  Problem Relation Age of Onset  . Stroke Mother   . Stroke Father   . Heart disease Father   . Dementia Brother   . Renal Disease Brother   . Renal Disease Daughter     Social History Social History  Substance Use Topics  . Smoking status: Never Smoker  . Smokeless tobacco: Never Used  . Alcohol use Yes     Allergies   Patient has no known allergies.   Review of Systems Review of Systems  Constitutional: Negative for fever.  HENT: Negative for trouble swallowing.   Eyes: Negative for visual disturbance.  Respiratory: Negative for shortness of breath.   Cardiovascular: Negative.   Gastrointestinal: Negative for abdominal pain, nausea and vomiting.  Genitourinary: Negative for dysuria.  Musculoskeletal: Positive for neck pain. Negative for arthralgias.  Skin: Positive for wound.  Neurological: Negative for syncope and headaches.     Physical Exam Updated Vital Signs BP 134/74   Pulse 75   Temp 98.4 F (36.9 C) (Oral)   Resp 14   SpO2 98%   Physical Exam  Constitutional: He is oriented to person, place, and time. He appears well-developed and well-nourished. No distress.  HENT:  Head: Normocephalic.  Laceration to left ear: see image below.   Neck:  Mild tenderness to palpation at midline.  Cardiovascular: Normal rate, regular rhythm and normal heart sounds.   No  murmur heard. Pulmonary/Chest: Effort normal and breath sounds normal. No respiratory distress.  Abdominal: Soft. He exhibits no distension. There is no tenderness.  Musculoskeletal:  5/5 muscle strength in all four extremities including grip strength. No tenderness to palpation of upper or lower extremities. No T or L spine tenderness.  Neurological: He is alert and oriented to person, place, and time.  Speech clear and goal oriented. Hard of hearing, but otherwise CN 2-12 grossly intact. All four extremities are neurovascularly intact.  Skin: Skin is warm and dry.  Nursing note and vitals reviewed.       ED Treatments / Results  Labs (  all labs ordered are listed, but only abnormal results are displayed) Labs Reviewed  BASIC METABOLIC PANEL - Abnormal; Notable for the following:       Result Value   Potassium 3.1 (*)    Glucose, Bld 210 (*)    BUN 21 (*)    All other components within normal limits  CBC WITH DIFFERENTIAL/PLATELET - Abnormal; Notable for the following:    WBC 17.4 (*)    RBC 3.89 (*)    Hemoglobin 12.0 (*)    HCT 35.8 (*)    Neutro Abs 15.2 (*)    Monocytes Absolute 1.2 (*)    All other components within normal limits  PROTIME-INR  APTT  TYPE AND SCREEN  PREPARE PLATELET PHERESIS  ABO/RH    EKG  EKG Interpretation None       Radiology Ct Head Wo Contrast  Result Date: 09/01/2016 CLINICAL DATA:  Fall, complex left ear laceration EXAM: CT HEAD WITHOUT CONTRAST CT CERVICAL SPINE WITHOUT CONTRAST TECHNIQUE: Multidetector CT imaging of the head and cervical spine was performed following the standard protocol without intravenous contrast. Multiplanar CT image reconstructions of the cervical spine were also generated. COMPARISON:  MRI brain dated 09/30/2014 FINDINGS: CT HEAD FINDINGS Brain: Subdural hematoma along the left frontal convexity measuring up to 8 mm in thickness (series 2/image 21). Underlying subarachnoid hemorrhage along the left  frontoparietal region and extending into the sylvian fissure (series 2/image 16). Associated subdural/subarachnoid hemorrhage extending along the anterior left temporal lobe in the middle cranial fossa (series 2/image 10). Mild subdural hematoma along the left tentorium (series 2/image 16). No evidence of acute infarction, hydrocephalus, or mass lesion/mass effect. No intraventricular hemorrhage. Basal cisterns remain patent. Global cortical atrophy.  Secondary ventricular prominence. Mild subcortical white matter and periventricular small vessel ischemic changes. Intracranial atherosclerosis. Vascular: No hyperdense vessel or unexpected calcification. Skull: Normal. Negative for fracture or focal lesion. Sinuses/Orbits: The visualized paranasal sinuses are essentially clear. The mastoid air cells are unopacified. Other: Soft tissue laceration involving the left ear (series 2/ image 10). Overlying dressing. CT CERVICAL SPINE FINDINGS Alignment: Normal. Skull base and vertebrae: No acute fracture. No primary bone lesion or focal pathologic process. Soft tissues and spinal canal: No prevertebral fluid or swelling. No visible canal hematoma. Disc levels: Mild degenerative changes of the mid cervical spine. Spinal canal remains patent. Upper chest: Visualized lung apices are clear. Other: Visualized thyroid is unremarkable. IMPRESSION: Left frontal subdural hematoma measuring up to 8 mm. Additional subdural/subarachnoid hemorrhage on the left, as described above. No midline shift. Basal cisterns remain patent. No intraventricular hemorrhage. No evidence of traumatic injury to the cervical spine. Critical Value/emergent results were called by telephone at the time of interpretation on 09/01/2016 at 12:03 pm to [facility], who verbally acknowledged these results. Electronically Signed   By: Julian Hy M.D.   On: 09/01/2016 12:04   Ct Cervical Spine Wo Contrast  Result Date: 09/01/2016 CLINICAL DATA:  Fall,  complex left ear laceration EXAM: CT HEAD WITHOUT CONTRAST CT CERVICAL SPINE WITHOUT CONTRAST TECHNIQUE: Multidetector CT imaging of the head and cervical spine was performed following the standard protocol without intravenous contrast. Multiplanar CT image reconstructions of the cervical spine were also generated. COMPARISON:  MRI brain dated 09/30/2014 FINDINGS: CT HEAD FINDINGS Brain: Subdural hematoma along the left frontal convexity measuring up to 8 mm in thickness (series 2/image 21). Underlying subarachnoid hemorrhage along the left frontoparietal region and extending into the sylvian fissure (series 2/image 16). Associated subdural/subarachnoid hemorrhage extending  along the anterior left temporal lobe in the middle cranial fossa (series 2/image 10). Mild subdural hematoma along the left tentorium (series 2/image 16). No evidence of acute infarction, hydrocephalus, or mass lesion/mass effect. No intraventricular hemorrhage. Basal cisterns remain patent. Global cortical atrophy.  Secondary ventricular prominence. Mild subcortical white matter and periventricular small vessel ischemic changes. Intracranial atherosclerosis. Vascular: No hyperdense vessel or unexpected calcification. Skull: Normal. Negative for fracture or focal lesion. Sinuses/Orbits: The visualized paranasal sinuses are essentially clear. The mastoid air cells are unopacified. Other: Soft tissue laceration involving the left ear (series 2/ image 10). Overlying dressing. CT CERVICAL SPINE FINDINGS Alignment: Normal. Skull base and vertebrae: No acute fracture. No primary bone lesion or focal pathologic process. Soft tissues and spinal canal: No prevertebral fluid or swelling. No visible canal hematoma. Disc levels: Mild degenerative changes of the mid cervical spine. Spinal canal remains patent. Upper chest: Visualized lung apices are clear. Other: Visualized thyroid is unremarkable. IMPRESSION: Left frontal subdural hematoma measuring up to 8  mm. Additional subdural/subarachnoid hemorrhage on the left, as described above. No midline shift. Basal cisterns remain patent. No intraventricular hemorrhage. No evidence of traumatic injury to the cervical spine. Critical Value/emergent results were called by telephone at the time of interpretation on 09/01/2016 at 12:03 pm to [facility], who verbally acknowledged these results. Electronically Signed   By: Julian Hy M.D.   On: 09/01/2016 12:04    Procedures Procedures (including critical care time)  CRITICAL CARE Performed by: Ozella Almond Ward   Total critical care time: 35 minutes  Critical care time was exclusive of separately billable procedures and treating other patients.  Critical care was necessary to treat or prevent imminent or life-threatening deterioration.  Critical care was time spent personally by me on the following activities: development of treatment plan with patient and/or surrogate as well as nursing, discussions with consultants, evaluation of patient's response to treatment, examination of patient, obtaining history from patient or surrogate, ordering and performing treatments and interventions, ordering and review of laboratory studies, ordering and review of radiographic studies, pulse oximetry and re-evaluation of patient's condition.   Medications Ordered in ED Medications  0.9 %  sodium chloride infusion ( Intravenous Not Given 09/01/16 1337)  lidocaine-EPINEPHrine (XYLOCAINE W/EPI) 1 %-1:100000 (with pres) injection 20 mL (not administered)  Tdap (BOOSTRIX) injection 0.5 mL (0.5 mLs Intramuscular Given 09/01/16 1122)  0.9 %  sodium chloride infusion (10 mL/hr Intravenous New Bag/Given 09/01/16 1337)     Initial Impression / Assessment and Plan / ED Course  I have reviewed the triage vital signs and the nursing notes.  Pertinent labs & imaging results that were available during my care of the patient were reviewed by me and considered in my  medical decision making (see chart for details).  Clinical Course    MATVEY SANTILLANA is a 82 y.o. male who presents to ED for mechanical fall leading to head injury. On exam, patient with no focal neuro deficits. Does have an extensive ear laceration. Pressure dressing applied and will consult with ENT. Tetanus updated. CT head and c-spine ordered given patient on blood thinners.   Discussed ear laceration with ENT, Dr. Benjamine Mola, who will evaluate patient when he is finished in the operating room.   Ear repaired at bedside by ENT.   CT cervical spine with no acute abnormalities.   CT head: IMPRESSION: Left frontal subdural hematoma measuring up to 8 mm. Additional subdural/subarachnoid hemorrhage on the left, as described above. No midline shift.  Basal cisterns remain patent. No intraventricular hemorrhage.  Consulted neurosurgery, Dr. Cyndy Freeze. Surgical intervention unlikely. Admit to medicine for overnight observation and repeat head CT in the morning.   Consulted medicine who will admit.   Patient seen by and discussed with Dr. Wilson Singer who agrees with treatment plan.    Final Clinical Impressions(s) / ED Diagnoses   Final diagnoses:  Subdural hematoma (HCC)  Subarachnoid bleed (HCC)  Complex laceration of left ear, initial encounter    New Prescriptions New Prescriptions   No medications on file     [place], PA-C 09/01/16 1742    Virgel Manifold, MD 09/11/16 1131

## 2016-09-01 NOTE — ED Triage Notes (Signed)
Pt arrives from [facility] after having a fall attempting to get in his car this morning. Pt hit his head, no loc, is on blood thinners.

## 2016-09-01 NOTE — ED Notes (Signed)
ENT at bedside

## 2016-09-01 NOTE — ED Provider Notes (Addendum)
Medical screening examination/treatment/procedure(s) were conducted as a shared visit with non-physician practitioner(s) and myself.  I personally evaluated the patient during the encounter.   EKG Interpretation None      89yM s/p fall. Tripped while taking out trash. Complex L ear laceration. Through helix, antihelix and into cymba. Small arteriole bleed. Controllable with pressure. Laceration is beyond my comfort level in closing. Denies HA or neck pain, but distracting injury and is on plavix. Will CT head/cervical spine. ENT consultation. NPO. Update tetanus.     Imaging with [facility]. Neuro exam nonfocal. No neuro complaints. Discussed with Dr Cyndy Freeze, neurosurgery. Request platelet transfusion with plavix usage. Will follow-in consultation.   CRITICAL CARE Performed by: Virgel Manifold Total critical care time: 35 minutes Critical care time was exclusive of separately billable procedures and treating other patients. Critical care was necessary to treat or prevent imminent or life-threatening deterioration. Critical care was time spent personally by me on the following activities: development of treatment plan with patient and/or surrogate as well as nursing, discussions with consultants, evaluation of patient's response to treatment, examination of patient, obtaining history from patient or surrogate, ordering and performing treatments and interventions, ordering and review of laboratory studies, ordering and review of radiographic studies, pulse oximetry and re-evaluation of patient's condition.     Virgel Manifold, MD 09/01/16 1341

## 2016-09-01 NOTE — Consult Note (Signed)
Reason for Consult: Complex left ear lacerations Referring Physician: Virgel Manifold, MD  HPI:  Kenneth Stark is an 82 y.o. male who presents to the ER today after a fall. Patient states that he was going to get into his car when he tripped and fell, hitting the left aspect of his head. No loss of consciousness. He has a large laceration on his left ear, with profuse bleeding. He also c/o left ear pain. Denies headache, back pain, abdominal pain, upper or lower extremity pain, numbness or weakness. He has a PMH of 6th nerve palsy, HTN, HLD, and CAD on Plavix.  Past Medical History:  Diagnosis Date  . Abnormality of gait   . Arthritis   . Cervical spondylosis   . Degenerative joint disease (DJD) of lumbar spine   . Diplopia   . Dyslipidemia   . Essential tremor   . Hearing difficulty    hearing aids  . Hypertension   . 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 Small vessel disease     Past Surgical History:  Procedure Laterality Date  . KNEE ARTHROSCOPY Left    Dr. Hart Robinsons 2002  . knee injections Right    Dr. Adriana Mccallum    Family History  Problem Relation Age of Onset  . Stroke Mother   . Stroke Father   . Heart disease Father   . Dementia Brother   . Renal Disease Brother   . Renal Disease Daughter     Social History:  reports that he has never smoked. He has never used smokeless tobacco. He reports that he drinks alcohol. He reports that he does not use drugs.  Allergies: No Known Allergies  Prior to Admission medications   Medication Sig Start Date End Date Taking? Authorizing Provider  amLODipine (NORVASC) 10 MG tablet Take 10 mg by mouth daily.   Yes Historical Provider, MD  Artificial Tear Ointment (EYE LUBRICANT OP) Place 1 drop into both eyes daily.   Yes Historical Provider, MD  Ascorbic Acid (VITAMIN C) 1000 MG tablet Take 1,000 mg by mouth 3 (three) times daily.    Yes Historical Provider, MD  aspirin 81 MG tablet Take 81 mg  by mouth daily.   Yes Historical Provider, MD  Calcium Carbonate-Vitamin D (CALTRATE 600+D PO) Take 600 mg by mouth 2 (two) times daily.    Yes Historical Provider, MD  clopidogrel (PLAVIX) 75 MG tablet TAKE ONE TABLET BY MOUTH ONCE DAILY Patient taking differently: TAKE 75 MG BY MOUTH ONCE DAILY 12/23/14  Yes [name], MD  Co-Enzyme Q-10 100 MG CAPS Take 100 mg by mouth daily.   Yes Historical Provider, MD  escitalopram (LEXAPRO) 10 MG tablet Take 10 mg by mouth daily.   Yes Historical Provider, MD  hydrochlorothiazide (HYDRODIURIL) 25 MG tablet Take 25 mg by mouth daily.   Yes Historical Provider, MD  Lutein-Zeaxanthin 25-5 MG CAPS Take 1 capsule by mouth daily.   Yes Historical Provider, MD  Memantine HCl-Donepezil HCl (NAMZARIC) 28-10 MG CP24 Take 1 capsule by mouth every evening.   Yes Historical Provider, MD  mirtazapine (REMERON) 30 MG tablet TAKE ONE TABLET AT BEDTIME Patient taking differently: TAKE 30 MG BY MOUTH AT BEDTIME 12/23/14  Yes [name], MD  Misc Natural Products (GLUCOSAMINE CHOND COMPLEX/MSM PO) Take 1 tablet by mouth 3 (three) times daily.    Yes Historical Provider, MD  Multiple Vitamin (MULTIVITAMIN) tablet Take 1 tablet  by mouth daily.   Yes Historical Provider, MD  Omega-3 Fatty Acids (OMEGA 3 PO) Take 1 capsule by mouth 2 (two) times daily.    Yes Historical Provider, MD  potassium chloride (K-DUR) 10 MEQ tablet Take 10 mEq by mouth daily. 11/05/13  Yes Historical Provider, MD  pravastatin (PRAVACHOL) 20 MG tablet Take 20 mg by mouth daily. 11/05/13  Yes Historical Provider, MD  RAPAFLO 8 MG CAPS capsule Take 8 mg by mouth daily. 11/05/13  Yes Historical Provider, MD  saw palmetto 500 MG capsule Take 500 mg by mouth daily.   Yes Historical Provider, MD  telmisartan (MICARDIS) 80 MG tablet Take 80 mg by mouth daily. 11/05/13  Yes Historical Provider, MD  triamcinolone (NASACORT) 55 MCG/ACT AERO nasal inhaler Place 2 sprays into the nose at bedtime.   Yes  Historical Provider, MD    Medications:  I have reviewed the patient's current medications. Scheduled: . lidocaine-EPINEPHrine  20 mL Other Once   PRN:  Results for orders placed or performed during the hospital encounter of 09/01/16 (from the past 48 hour(s))  Basic metabolic panel     Status: Abnormal   Collection Time: 09/01/16 12:01 PM  Result Value Ref Range   Sodium 138 135 - 145 mmol/L   Potassium 3.1 (L) 3.5 - 5.1 mmol/L   Chloride 104 101 - 111 mmol/L   CO2 24 22 - 32 mmol/L   Glucose, Bld 210 (H) 65 - 99 mg/dL   BUN 21 (H) 6 - 20 mg/dL   Creatinine, Ser 0.94 0.61 - 1.24 mg/dL   Calcium 9.7 8.9 - 10.3 mg/dL   GFR calc non Af Amer >60 >60 mL/min   GFR calc Af Amer >60 >60 mL/min    Comment: (NOTE) The eGFR has been calculated using the CKD EPI equation. This calculation has not been validated in all clinical situations. eGFR's persistently <60 mL/min signify possible Chronic Kidney Disease.    Anion gap 10 5 - 15  CBC with Differential     Status: Abnormal   Collection Time: 09/01/16 12:01 PM  Result Value Ref Range   WBC 17.4 (H) 4.0 - 10.5 K/uL   RBC 3.89 (L) 4.22 - 5.81 MIL/uL   Hemoglobin 12.0 (L) 13.0 - 17.0 g/dL   HCT 35.8 (L) 39.0 - 52.0 %   MCV 92.0 78.0 - 100.0 fL   MCH 30.8 26.0 - 34.0 pg   MCHC 33.5 30.0 - 36.0 g/dL   RDW 14.5 11.5 - 15.5 %   Platelets 220 150 - 400 K/uL   Neutrophils Relative % 87 %   Lymphocytes Relative 6 %   Monocytes Relative 7 %   Eosinophils Relative 0 %   Basophils Relative 0 %   Neutro Abs 15.2 (H) 1.7 - 7.7 K/uL   Lymphs Abs 1.0 0.7 - 4.0 K/uL   Monocytes Absolute 1.2 (H) 0.1 - 1.0 K/uL   Eosinophils Absolute 0.0 0.0 - 0.7 K/uL   Basophils Absolute 0.0 0.0 - 0.1 K/uL   Smear Review MORPHOLOGY UNREMARKABLE   Protime-INR     Status: None   Collection Time: 09/01/16 12:01 PM  Result Value Ref Range   Prothrombin Time 14.0 11.4 - 15.2 seconds   INR 1.07   APTT     Status: None   Collection Time: 09/01/16 12:01 PM    Result Value Ref Range   aPTT 26 24 - 36 seconds  ABO/Rh     Status: None   Collection Time: 09/01/16  12:01 PM  Result Value Ref Range   ABO/RH(D) O NEG   Prepare Pheresed Platelets     Status: None (Preliminary result)   Collection Time: 09/01/16 12:44 PM  Result Value Ref Range   Unit Number [id number]    Blood Component Type PLTP LR2 PAS    Unit division 00    Status of Unit ISSUED    Transfusion Status OK TO TRANSFUSE   Type and screen [place]     Status: None   Collection Time: 09/01/16  1:10 PM  Result Value Ref Range   ABO/RH(D) O NEG    Antibody Screen NEG    Sample Expiration 09/04/2016     Ct Head Wo Contrast  Result Date: 09/01/2016 CLINICAL DATA:  Fall, complex left ear laceration EXAM: CT HEAD WITHOUT CONTRAST CT CERVICAL SPINE WITHOUT CONTRAST TECHNIQUE: Multidetector CT imaging of the head and cervical spine was performed following the standard protocol without intravenous contrast. Multiplanar CT image reconstructions of the cervical spine were also generated. COMPARISON:  MRI brain dated 09/30/2014 FINDINGS: CT HEAD FINDINGS Brain: Subdural hematoma along the left frontal convexity measuring up to 8 mm in thickness (series 2/image 21). Underlying subarachnoid hemorrhage along the left frontoparietal region and extending into the sylvian fissure (series 2/image 16). Associated subdural/subarachnoid hemorrhage extending along the anterior left temporal lobe in the middle cranial fossa (series 2/image 10). Mild subdural hematoma along the left tentorium (series 2/image 16). No evidence of acute infarction, hydrocephalus, or mass lesion/mass effect. No intraventricular hemorrhage. Basal cisterns remain patent. Global cortical atrophy.  Secondary ventricular prominence. Mild subcortical white matter and periventricular small vessel ischemic changes. Intracranial atherosclerosis. Vascular: No hyperdense vessel or unexpected calcification. Skull: Normal.  Negative for fracture or focal lesion. Sinuses/Orbits: The visualized paranasal sinuses are essentially clear. The mastoid air cells are unopacified. Other: Soft tissue laceration involving the left ear (series 2/ image 10). Overlying dressing. CT CERVICAL SPINE FINDINGS Alignment: Normal. Skull base and vertebrae: No acute fracture. No primary bone lesion or focal pathologic process. Soft tissues and spinal canal: No prevertebral fluid or swelling. No visible canal hematoma. Disc levels: Mild degenerative changes of the mid cervical spine. Spinal canal remains patent. Upper chest: Visualized lung apices are clear. Other: Visualized thyroid is unremarkable. IMPRESSION: Left frontal subdural hematoma measuring up to 8 mm. Additional subdural/subarachnoid hemorrhage on the left, as described above. No midline shift. Basal cisterns remain patent. No intraventricular hemorrhage. No evidence of traumatic injury to the cervical spine. Critical Value/emergent results were called by telephone at the time of interpretation on 09/01/2016 at 12:03 pm to [facility], who verbally acknowledged these results. Electronically Signed   By: Julian Hy M.D.   On: 09/01/2016 12:04   Ct Cervical Spine Wo Contrast  Result Date: 09/01/2016 CLINICAL DATA:  Fall, complex left ear laceration EXAM: CT HEAD WITHOUT CONTRAST CT CERVICAL SPINE WITHOUT CONTRAST TECHNIQUE: Multidetector CT imaging of the head and cervical spine was performed following the standard protocol without intravenous contrast. Multiplanar CT image reconstructions of the cervical spine were also generated. COMPARISON:  MRI brain dated 09/30/2014 FINDINGS: CT HEAD FINDINGS Brain: Subdural hematoma along the left frontal convexity measuring up to 8 mm in thickness (series 2/image 21). Underlying subarachnoid hemorrhage along the left frontoparietal region and extending into the sylvian fissure (series 2/image 16). Associated subdural/subarachnoid hemorrhage  extending along the anterior left temporal lobe in the middle cranial fossa (series 2/image 10). Mild subdural hematoma along the left tentorium (series 2/image  16). No evidence of acute infarction, hydrocephalus, or mass lesion/mass effect. No intraventricular hemorrhage. Basal cisterns remain patent. Global cortical atrophy.  Secondary ventricular prominence. Mild subcortical white matter and periventricular small vessel ischemic changes. Intracranial atherosclerosis. Vascular: No hyperdense vessel or unexpected calcification. Skull: Normal. Negative for fracture or focal lesion. Sinuses/Orbits: The visualized paranasal sinuses are essentially clear. The mastoid air cells are unopacified. Other: Soft tissue laceration involving the left ear (series 2/ image 10). Overlying dressing. CT CERVICAL SPINE FINDINGS Alignment: Normal. Skull base and vertebrae: No acute fracture. No primary bone lesion or focal pathologic process. Soft tissues and spinal canal: No prevertebral fluid or swelling. No visible canal hematoma. Disc levels: Mild degenerative changes of the mid cervical spine. Spinal canal remains patent. Upper chest: Visualized lung apices are clear. Other: Visualized thyroid is unremarkable. IMPRESSION: Left frontal subdural hematoma measuring up to 8 mm. Additional subdural/subarachnoid hemorrhage on the left, as described above. No midline shift. Basal cisterns remain patent. No intraventricular hemorrhage. No evidence of traumatic injury to the cervical spine. Critical Value/emergent results were called by telephone at the time of interpretation on 09/01/2016 at 12:03 pm to [facility], who verbally acknowledged these results. Electronically Signed   By: Julian Hy M.D.   On: 09/01/2016 12:04   Review of Systems  Constitutional: Negative for fever.  HENT: Negative for trouble swallowing.   Eyes: Negative for visual disturbance.  Respiratory: Negative for shortness of breath.   Cardiovascular:  Negative.   Gastrointestinal: Negative for abdominal pain, nausea and vomiting.  Genitourinary: Negative for dysuria.  Musculoskeletal: Positive for neck pain. Negative for arthralgias.  Skin: Positive for wound.  Neurological: Negative for syncope and headaches.   Blood pressure 134/74, pulse 75, temperature 98.4 F (36.9 C), temperature source Oral, resp. rate 14, SpO2 98 %. Physical Exam  Constitutional: He is oriented to person, place, and time. He appears well-developed and well-nourished. No distress.  Head: Normocephalic.  Ears: Large laceration to left ear: see image below.   Nose: Normal mucosa. No bleeding. Mouth: Normal mucosa. No bleeding or injury. Neck: Mild tenderness to palpation at midline. No LAD or mass. Cardiovascular: Normal rate, regular rhythm and normal heart sounds.   Pulmonary/Chest: Effort normal and breath sounds normal. No respiratory distress.  Abdominal: Soft. He exhibits no distension. There is no tenderness.  Musculoskeletal:  5/5 muscle strength in all four extremities including grip strength. No tenderness to palpation of upper or lower extremities. No T or L spine tenderness.  Neurological: He is alert and oriented to person, place, and time.  Speech clear and goal oriented. Hard of hearing, but otherwise CN 2-12 grossly intact. All four extremities are neurovascularly intact.  Skin: Skin is warm and dry.  Nursing note and vitals reviewed.    Procedure: Complex repair of left ear laceration (7cm) Anesthesia: Local anesthesia with 1% lidocaine with 1:100,000 epinephrine Description: The patient is placed supine on the hospital table. The left ear is prepped and draped in a sterile fashion.  After adequate local anesthesia is achieved, the laceration site is carefully debrided.  Extensive soft tissue undermining is performed to release the skin tension. The laceration is closed in layers with interrupted sutures. Both the skin and the cartilage are  reapproximated  The patient tolerated the procedure well.    Assessment/Plan: Complex through-and-through left ear laceration (7cm). Repaired in the ER. Avoid further trauma to the left ear. Will remove sutures in 7-10 days.  Devontre Siedschlag,SUI W 09/01/2016, 5:54 PM

## 2016-09-01 NOTE — Progress Notes (Signed)
PHARMACIST - PHYSICIAN ORDER COMMUNICATION  CONCERNING: P&T Medication Policy on Herbal Medications  DESCRIPTION:  This patient's order for:  Saw Palmetto  has been noted.  This product(s) is classified as an "herbal" or natural product. Due to a lack of definitive safety studies or FDA approval, nonstandard manufacturing practices, plus the potential risk of unknown drug-drug interactions while on inpatient medications, the Pharmacy and Therapeutics Committee does not permit the use of "herbal" or natural products of this type within [facility].   ACTION TAKEN: The pharmacy department is unable to verify this order at this time and your patient has been informed of this safety policy. Please reevaluate patient's clinical condition at discharge and address if the herbal or natural product(s) should be resumed at that time.  Thank you for allowing pharmacy to be a part of this patient's care.  Alycia Rossetti, PharmD, BCPS Clinical Pharmacist Pager: [phone] 09/01/2016 8:41 PM

## 2016-09-01 NOTE — H&P (Signed)
History and Physical  Kenneth Stark [id number] DOB: Jul 01, 1925 DOA: 09/01/2016  Referring physician: Pearlie Oyster, PA-C, ED provider PCP: Geoffery Lyons, MD  Outpatient Specialists:   Dr. Jannifer Franklin (neurology)  Chief Complaint: Fall  HPI: Kenneth Stark is a 82 y.o. male with a history of cerebrovascular disease on Plavix, TIA, degenerative joint disease, hypertension, essential tremor, arthritis, hyperlipidemia. Patient presents to the emergency department following a fall shortly prior to arrival in the emergency department. Patient hit the left aspect of his head and sustained a left ear laceration. Denies loss of consciousness, back pain, abdominal pain, extremity pain, numbness, or weakness. Does have pain to his left ear and mild neck pain.  Emergency Department Course: Patient had a CT of the head and neck which showed a 95mm left frontal subdural hematoma, left mild subdural hematoma along the left tentorium, with underlying subarachnoid hemorrhage. Neurosurgery consulted on the patient, who ruled that patient should be managed medically. Neurosurgery did ask that the patient be transfused 2 units of plate limits due to the patient being on Plavix. ENT was consulted for the laceration repair. Patient has remained stable neurologically and hemodynamically.  Review of Systems:   Pt denies any fevers, chills, nausea, vomiting, diarrhea, constipation, abdominal pain, shortness of breath, dyspnea on exertion, orthopnea, cough, wheezing, palpitations, headache, vision changes, lightheadedness, dizziness, melena, rectal bleeding.  Review of systems are otherwise negative  Past Medical History:  Diagnosis Date  . Abnormality of gait   . Arthritis   . Cervical spondylosis   . Degenerative joint disease (DJD) of lumbar spine   . Diplopia   . Dyslipidemia   . Essential tremor   . Hearing difficulty    hearing aids  . Hypertension   . 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 Small vessel disease    Past Surgical History:  Procedure Laterality Date  . KNEE ARTHROSCOPY Left    Dr. Hart Robinsons 2002  . knee injections Right    Dr. Adriana Mccallum   Social History:  reports that he has never smoked. He has never used smokeless tobacco. He reports that he drinks alcohol. He reports that he does not use drugs. Patient lives at home  No Known Allergies  Family History  Problem Relation Age of Onset  . Stroke Mother   . Stroke Father   . Heart disease Father   . Dementia Brother   . Renal Disease Brother   . Renal Disease Daughter      Prior to Admission medications   Medication Sig Start Date End Date Taking? Authorizing Provider  amLODipine (NORVASC) 10 MG tablet Take 10 mg by mouth daily.   Yes Historical Provider, MD  Artificial Tear Ointment (EYE LUBRICANT OP) Place 1 drop into both eyes daily.   Yes Historical Provider, MD  Ascorbic Acid (VITAMIN C) 1000 MG tablet Take 1,000 mg by mouth 3 (three) times daily.    Yes Historical Provider, MD  aspirin 81 MG tablet Take 81 mg by mouth daily.   Yes Historical Provider, MD  Calcium Carbonate-Vitamin D (CALTRATE 600+D PO) Take 600 mg by mouth 2 (two) times daily.    Yes Historical Provider, MD  clopidogrel (PLAVIX) 75 MG tablet TAKE ONE TABLET BY MOUTH ONCE DAILY Patient taking differently: TAKE 75 MG BY MOUTH ONCE DAILY 12/23/14  Yes [name], MD  Co-Enzyme Q-10 100 MG CAPS Take 100 mg by mouth daily.   Yes  Historical Provider, MD  escitalopram (LEXAPRO) 10 MG tablet Take 10 mg by mouth daily.   Yes Historical Provider, MD  hydrochlorothiazide (HYDRODIURIL) 25 MG tablet Take 25 mg by mouth daily.   Yes Historical Provider, MD  Lutein-Zeaxanthin 25-5 MG CAPS Take 1 capsule by mouth daily.   Yes Historical Provider, MD  Memantine HCl-Donepezil HCl (NAMZARIC) 28-10 MG CP24 Take 1 capsule by mouth every evening.   Yes Historical Provider, MD  mirtazapine (REMERON) 30 MG tablet TAKE  ONE TABLET AT BEDTIME Patient taking differently: TAKE 30 MG BY MOUTH AT BEDTIME 12/23/14  Yes [name], MD  Misc Natural Products (GLUCOSAMINE CHOND COMPLEX/MSM PO) Take 1 tablet by mouth 3 (three) times daily.    Yes Historical Provider, MD  Multiple Vitamin (MULTIVITAMIN) tablet Take 1 tablet by mouth daily.   Yes Historical Provider, MD  Omega-3 Fatty Acids (OMEGA 3 PO) Take 1 capsule by mouth 2 (two) times daily.    Yes Historical Provider, MD  potassium chloride (K-DUR) 10 MEQ tablet Take 10 mEq by mouth daily. 11/05/13  Yes Historical Provider, MD  pravastatin (PRAVACHOL) 20 MG tablet Take 20 mg by mouth daily. 11/05/13  Yes Historical Provider, MD  RAPAFLO 8 MG CAPS capsule Take 8 mg by mouth daily. 11/05/13  Yes Historical Provider, MD  saw palmetto 500 MG capsule Take 500 mg by mouth daily.   Yes Historical Provider, MD  telmisartan (MICARDIS) 80 MG tablet Take 80 mg by mouth daily. 11/05/13  Yes Historical Provider, MD  triamcinolone (NASACORT) 55 MCG/ACT AERO nasal inhaler Place 2 sprays into the nose at bedtime.   Yes Historical Provider, MD    Physical Exam: BP 141/58   Pulse 69   Temp 98.4 F (36.9 C) (Oral)   Resp 14   SpO2 97%   General: Elderly Caucasian gentleman. Awake and alert and oriented x3. No acute cardiopulmonary distress.  HEENT: Normocephalic atraumatic.  Right ear normal in appearance.  Left ear status post repair. Pupils equal, round, reactive to light. Extraocular muscles are intact. Sclerae anicteric and noninjected.  Moist mucosal membranes. No mucosal lesions.  Neck: Neck supple without lymphadenopathy. No carotid bruits. No masses palpated.  Cardiovascular: Regular rate with normal S1-S2 sounds. No murmurs, rubs, gallops auscultated. No JVD.  Respiratory: Good respiratory effort with no wheezes, rales, rhonchi. Lungs clear to auscultation bilaterally.  No accessory muscle use. Abdomen: Soft, nontender, nondistended. Active bowel sounds. No masses or  hepatosplenomegaly  Skin: No rashes, lesions, or ulcerations.  Dry, warm to touch. 2+ dorsalis pedis and radial pulses. Musculoskeletal: No calf or leg pain. All major joints not erythematous nontender.  No upper or lower joint deformation.  Good ROM.  No contractures  Psychiatric: Intact judgment and insight. Pleasant and cooperative. Neurologic: No focal neurological deficits. Strength is 5/5 and symmetric in upper and lower extremities.  Cranial nerves II through XII are grossly intact.           Labs on Admission: I have personally reviewed following labs and imaging studies  CBC:  Recent Labs Lab 09/01/16 1201  WBC 17.4*  NEUTROABS 15.2*  HGB 12.0*  HCT 35.8*  MCV 92.0  PLT [id number]   Basic Metabolic Panel:  Recent Labs Lab 09/01/16 1201  NA 138  K 3.1*  CL 104  CO2 24  GLUCOSE 210*  BUN 21*  CREATININE 0.94  CALCIUM 9.7   GFR: CrCl cannot be calculated (Unknown ideal weight.). Liver Function Tests: No results for input(s): AST, ALT,  ALKPHOS, BILITOT, PROT, ALBUMIN in the last 168 hours. No results for input(s): LIPASE, AMYLASE in the last 168 hours. No results for input(s): AMMONIA in the last 168 hours. Coagulation Profile:  Recent Labs Lab 09/01/16 1201  INR 1.07   Cardiac Enzymes: No results for input(s): CKTOTAL, CKMB, CKMBINDEX, TROPONINI in the last 168 hours. BNP (last 3 results) No results for input(s): PROBNP in the last 8760 hours. HbA1C: No results for input(s): HGBA1C in the last 72 hours. CBG: No results for input(s): GLUCAP in the last 168 hours. Lipid Profile: No results for input(s): CHOL, HDL, LDLCALC, TRIG, CHOLHDL, LDLDIRECT in the last 72 hours. Thyroid Function Tests: No results for input(s): TSH, T4TOTAL, FREET4, T3FREE, THYROIDAB in the last 72 hours. Anemia Panel: No results for input(s): VITAMINB12, FOLATE, FERRITIN, TIBC, IRON, RETICCTPCT in the last 72 hours. Urine analysis: No results found for: COLORURINE, APPEARANCEUR,  LABSPEC, PHURINE, GLUCOSEU, HGBUR, BILIRUBINUR, KETONESUR, PROTEINUR, UROBILINOGEN, NITRITE, LEUKOCYTESUR Sepsis Labs: @LABRCNTIP (procalcitonin:4,lacticidven:4) )No results found for this or any previous visit (from the past 240 hour(s)).   Radiological Exams on Admission: Ct Head Wo Contrast  Result Date: 09/01/2016 CLINICAL DATA:  Fall, complex left ear laceration EXAM: CT HEAD WITHOUT CONTRAST CT CERVICAL SPINE WITHOUT CONTRAST TECHNIQUE: Multidetector CT imaging of the head and cervical spine was performed following the standard protocol without intravenous contrast. Multiplanar CT image reconstructions of the cervical spine were also generated. COMPARISON:  MRI brain dated 09/30/2014 FINDINGS: CT HEAD FINDINGS Brain: Subdural hematoma along the left frontal convexity measuring up to 8 mm in thickness (series 2/image 21). Underlying subarachnoid hemorrhage along the left frontoparietal region and extending into the sylvian fissure (series 2/image 16). Associated subdural/subarachnoid hemorrhage extending along the anterior left temporal lobe in the middle cranial fossa (series 2/image 10). Mild subdural hematoma along the left tentorium (series 2/image 16). No evidence of acute infarction, hydrocephalus, or mass lesion/mass effect. No intraventricular hemorrhage. Basal cisterns remain patent. Global cortical atrophy.  Secondary ventricular prominence. Mild subcortical white matter and periventricular small vessel ischemic changes. Intracranial atherosclerosis. Vascular: No hyperdense vessel or unexpected calcification. Skull: Normal. Negative for fracture or focal lesion. Sinuses/Orbits: The visualized paranasal sinuses are essentially clear. The mastoid air cells are unopacified. Other: Soft tissue laceration involving the left ear (series 2/ image 10). Overlying dressing. CT CERVICAL SPINE FINDINGS Alignment: Normal. Skull base and vertebrae: No acute fracture. No primary bone lesion or focal  pathologic process. Soft tissues and spinal canal: No prevertebral fluid or swelling. No visible canal hematoma. Disc levels: Mild degenerative changes of the mid cervical spine. Spinal canal remains patent. Upper chest: Visualized lung apices are clear. Other: Visualized thyroid is unremarkable. IMPRESSION: Left frontal subdural hematoma measuring up to 8 mm. Additional subdural/subarachnoid hemorrhage on the left, as described above. No midline shift. Basal cisterns remain patent. No intraventricular hemorrhage. No evidence of traumatic injury to the cervical spine. Critical Value/emergent results were called by telephone at the time of interpretation on 09/01/2016 at 12:03 pm to [facility], who verbally acknowledged these results. Electronically Signed   By: Julian Hy M.D.   On: 09/01/2016 12:04   Ct Cervical Spine Wo Contrast  Result Date: 09/01/2016 CLINICAL DATA:  Fall, complex left ear laceration EXAM: CT HEAD WITHOUT CONTRAST CT CERVICAL SPINE WITHOUT CONTRAST TECHNIQUE: Multidetector CT imaging of the head and cervical spine was performed following the standard protocol without intravenous contrast. Multiplanar CT image reconstructions of the cervical spine were also generated. COMPARISON:  MRI brain dated 09/30/2014 FINDINGS:  CT HEAD FINDINGS Brain: Subdural hematoma along the left frontal convexity measuring up to 8 mm in thickness (series 2/image 21). Underlying subarachnoid hemorrhage along the left frontoparietal region and extending into the sylvian fissure (series 2/image 16). Associated subdural/subarachnoid hemorrhage extending along the anterior left temporal lobe in the middle cranial fossa (series 2/image 10). Mild subdural hematoma along the left tentorium (series 2/image 16). No evidence of acute infarction, hydrocephalus, or mass lesion/mass effect. No intraventricular hemorrhage. Basal cisterns remain patent. Global cortical atrophy.  Secondary ventricular prominence. Mild  subcortical white matter and periventricular small vessel ischemic changes. Intracranial atherosclerosis. Vascular: No hyperdense vessel or unexpected calcification. Skull: Normal. Negative for fracture or focal lesion. Sinuses/Orbits: The visualized paranasal sinuses are essentially clear. The mastoid air cells are unopacified. Other: Soft tissue laceration involving the left ear (series 2/ image 10). Overlying dressing. CT CERVICAL SPINE FINDINGS Alignment: Normal. Skull base and vertebrae: No acute fracture. No primary bone lesion or focal pathologic process. Soft tissues and spinal canal: No prevertebral fluid or swelling. No visible canal hematoma. Disc levels: Mild degenerative changes of the mid cervical spine. Spinal canal remains patent. Upper chest: Visualized lung apices are clear. Other: Visualized thyroid is unremarkable. IMPRESSION: Left frontal subdural hematoma measuring up to 8 mm. Additional subdural/subarachnoid hemorrhage on the left, as described above. No midline shift. Basal cisterns remain patent. No intraventricular hemorrhage. No evidence of traumatic injury to the cervical spine. Critical Value/emergent results were called by telephone at the time of interpretation on 09/01/2016 at 12:03 pm to [facility], who verbally acknowledged these results. Electronically Signed   By: Julian Hy M.D.   On: 09/01/2016 12:04    Assessment/Plan: Principal Problem:   Subdural hematoma (HCC) Active Problems:   Complex laceration of left ear   Essential hypertension   Cerebrovascular disease   History of TIA (transient ischemic attack)    This patient was discussed with the ED physician, including pertinent vitals, physical exam findings, labs, and imaging.  We also discussed care given by the ED provider.  #1 subdural hematoma  Observation in stepdown on telemetry  2 units of platelets  Hold Plavix  CBC tonight and tomorrow morning  CT and the morning  Appreciate  neurosurgery following this patient with [place] #2 complex laceration of left ear  Status post repair by ENT #3 essential hypertension   continue concurrent regimen #4 cerebrovascular disease #5 history of TIA  Hold Plavix for now   DVT prophylaxis: SCDs Consultants: Neurosurgery and ENT Code Status: Full Family Communication: None  Disposition Plan: Observation with likely discharge tomorrow   Truett Mainland, DO [facility] Pager [phone]  If 7PM-7AM, please contact night-coverage [email or web address] Password [email or web address]

## 2016-09-01 NOTE — Consult Note (Signed)
CC:  Chief Complaint  Patient presents with  . Fall    HPI: Kenneth Stark is a 82 y.o. male s/p ground level fall with head injury.  He did not lose consciousness.  He has minimal headache.  Has left ear pain and laceration.  Denies new focal weakness or numbness.  PMH: Past Medical History:  Diagnosis Date  . Abnormality of gait   . Arthritis   . Cervical spondylosis   . Degenerative joint disease (DJD) of lumbar spine   . Diplopia   . Dyslipidemia   . Essential tremor   . Hearing difficulty    hearing aids  . Hypertension   . 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 Small vessel disease     PSH: Past Surgical History:  Procedure Laterality Date  . KNEE ARTHROSCOPY Left    Dr. Hart Robinsons 2002  . knee injections Right    Dr. Adriana Mccallum    SH: Social History  Substance Use Topics  . Smoking status: Never Smoker  . Smokeless tobacco: Never Used  . Alcohol use Yes    MEDS: Prior to Admission medications   Medication Sig Start Date End Date Taking? Authorizing Provider  amLODipine (NORVASC) 10 MG tablet Take 10 mg by mouth daily.   Yes Historical Provider, MD  Artificial Tear Ointment (EYE LUBRICANT OP) Place 1 drop into both eyes daily.   Yes Historical Provider, MD  Ascorbic Acid (VITAMIN C) 1000 MG tablet Take 1,000 mg by mouth 3 (three) times daily.    Yes Historical Provider, MD  aspirin 81 MG tablet Take 81 mg by mouth daily.   Yes Historical Provider, MD  Calcium Carbonate-Vitamin D (CALTRATE 600+D PO) Take 600 mg by mouth 2 (two) times daily.    Yes Historical Provider, MD  clopidogrel (PLAVIX) 75 MG tablet TAKE ONE TABLET BY MOUTH ONCE DAILY Patient taking differently: TAKE 75 MG BY MOUTH ONCE DAILY 12/23/14  Yes [name], MD  Co-Enzyme Q-10 100 MG CAPS Take 100 mg by mouth daily.   Yes Historical Provider, MD  escitalopram (LEXAPRO) 10 MG tablet Take 10 mg by mouth daily.   Yes Historical Provider, MD   hydrochlorothiazide (HYDRODIURIL) 25 MG tablet Take 25 mg by mouth daily.   Yes Historical Provider, MD  Lutein-Zeaxanthin 25-5 MG CAPS Take 1 capsule by mouth daily.   Yes Historical Provider, MD  Memantine HCl-Donepezil HCl (NAMZARIC) 28-10 MG CP24 Take 1 capsule by mouth every evening.   Yes Historical Provider, MD  mirtazapine (REMERON) 30 MG tablet TAKE ONE TABLET AT BEDTIME Patient taking differently: TAKE 30 MG BY MOUTH AT BEDTIME 12/23/14  Yes [name], MD  Misc Natural Products (GLUCOSAMINE CHOND COMPLEX/MSM PO) Take 1 tablet by mouth 3 (three) times daily.    Yes Historical Provider, MD  Multiple Vitamin (MULTIVITAMIN) tablet Take 1 tablet by mouth daily.   Yes Historical Provider, MD  Omega-3 Fatty Acids (OMEGA 3 PO) Take 1 capsule by mouth 2 (two) times daily.    Yes Historical Provider, MD  potassium chloride (K-DUR) 10 MEQ tablet Take 10 mEq by mouth daily. 11/05/13  Yes Historical Provider, MD  pravastatin (PRAVACHOL) 20 MG tablet Take 20 mg by mouth daily. 11/05/13  Yes Historical Provider, MD  RAPAFLO 8 MG CAPS capsule Take 8 mg by mouth daily. 11/05/13  Yes Historical Provider, MD  saw palmetto 500 MG capsule Take 500 mg by mouth daily.  Yes Historical Provider, MD  telmisartan (MICARDIS) 80 MG tablet Take 80 mg by mouth daily. 11/05/13  Yes Historical Provider, MD  triamcinolone (NASACORT) 55 MCG/ACT AERO nasal inhaler Place 2 sprays into the nose at bedtime.   Yes Historical Provider, MD    ALLERGY: No Known Allergies  ROS: Review of Systems  Constitutional: Negative.   HENT: Positive for ear pain.   Eyes: Negative.   Respiratory: Negative.   Cardiovascular: Negative.   Gastrointestinal: Negative.   Genitourinary: Negative.   Musculoskeletal: Negative.   Skin: Negative.   Neurological: Positive for headaches.  Endo/Heme/Allergies: Bruises/bleeds easily.  All other systems reviewed and are negative.   NEUROLOGIC EXAM: Awake, alert, oriented Memory and  concentration grossly intact Speech fluent, appropriate CN grossly intact Motor exam: Upper Extremities Deltoid Bicep Tricep Grip  Right 5/5 5/5 5/5 5/5  Left 5/5 5/5 5/5 5/5   Lower Extremity IP Quad PF DF EHL  Right 5/5 5/5 5/5 5/5 5/5  Left 5/5 5/5 5/5 5/5 5/5   Sensation grossly intact to LT  IMAGING: Small left subdural hematoma without mass effect.  Left sylvian and convexity Subarachnoid hemorrhage.  IMPRESSION: - 82 y.o. male with mild TBI, SDH and SAH.  Neuro baseline.  PLAN: - 2units platelets - Observe overnight - Repeat CT in AM

## 2016-09-02 ENCOUNTER — Observation Stay (HOSPITAL_COMMUNITY): Payer: Medicare Other

## 2016-09-02 DIAGNOSIS — Z8673 Personal history of transient ischemic attack (TIA), and cerebral infarction without residual deficits: Secondary | ICD-10-CM

## 2016-09-02 DIAGNOSIS — Z8249 Family history of ischemic heart disease and other diseases of the circulatory system: Secondary | ICD-10-CM | POA: Diagnosis not present

## 2016-09-02 DIAGNOSIS — I10 Essential (primary) hypertension: Secondary | ICD-10-CM

## 2016-09-02 DIAGNOSIS — Z823 Family history of stroke: Secondary | ICD-10-CM | POA: Diagnosis not present

## 2016-09-02 DIAGNOSIS — Z7902 Long term (current) use of antithrombotics/antiplatelets: Secondary | ICD-10-CM | POA: Diagnosis not present

## 2016-09-02 DIAGNOSIS — H4920 Sixth [abducent] nerve palsy, unspecified eye: Secondary | ICD-10-CM | POA: Diagnosis present

## 2016-09-02 DIAGNOSIS — I679 Cerebrovascular disease, unspecified: Secondary | ICD-10-CM | POA: Diagnosis not present

## 2016-09-02 DIAGNOSIS — M1990 Unspecified osteoarthritis, unspecified site: Secondary | ICD-10-CM | POA: Diagnosis present

## 2016-09-02 DIAGNOSIS — Z841 Family history of disorders of kidney and ureter: Secondary | ICD-10-CM | POA: Diagnosis not present

## 2016-09-02 DIAGNOSIS — E785 Hyperlipidemia, unspecified: Secondary | ICD-10-CM | POA: Diagnosis present

## 2016-09-02 DIAGNOSIS — S01312A Laceration without foreign body of left ear, initial encounter: Secondary | ICD-10-CM

## 2016-09-02 DIAGNOSIS — F0390 Unspecified dementia without behavioral disturbance: Secondary | ICD-10-CM | POA: Diagnosis present

## 2016-09-02 DIAGNOSIS — Z23 Encounter for immunization: Secondary | ICD-10-CM | POA: Diagnosis present

## 2016-09-02 DIAGNOSIS — G250 Essential tremor: Secondary | ICD-10-CM | POA: Diagnosis present

## 2016-09-02 DIAGNOSIS — S065X0A Traumatic subdural hemorrhage without loss of consciousness, initial encounter: Secondary | ICD-10-CM | POA: Diagnosis present

## 2016-09-02 DIAGNOSIS — I6200 Nontraumatic subdural hemorrhage, unspecified: Secondary | ICD-10-CM | POA: Diagnosis not present

## 2016-09-02 DIAGNOSIS — H9190 Unspecified hearing loss, unspecified ear: Secondary | ICD-10-CM | POA: Diagnosis present

## 2016-09-02 DIAGNOSIS — M5136 Other intervertebral disc degeneration, lumbar region: Secondary | ICD-10-CM | POA: Diagnosis present

## 2016-09-02 DIAGNOSIS — W010XXA Fall on same level from slipping, tripping and stumbling without subsequent striking against object, initial encounter: Secondary | ICD-10-CM | POA: Diagnosis not present

## 2016-09-02 DIAGNOSIS — M47812 Spondylosis without myelopathy or radiculopathy, cervical region: Secondary | ICD-10-CM | POA: Diagnosis present

## 2016-09-02 DIAGNOSIS — S066X0A Traumatic subarachnoid hemorrhage without loss of consciousness, initial encounter: Secondary | ICD-10-CM | POA: Diagnosis present

## 2016-09-02 DIAGNOSIS — Z7982 Long term (current) use of aspirin: Secondary | ICD-10-CM | POA: Diagnosis not present

## 2016-09-02 DIAGNOSIS — I2510 Atherosclerotic heart disease of native coronary artery without angina pectoris: Secondary | ICD-10-CM | POA: Diagnosis present

## 2016-09-02 DIAGNOSIS — D649 Anemia, unspecified: Secondary | ICD-10-CM | POA: Diagnosis present

## 2016-09-02 DIAGNOSIS — E876 Hypokalemia: Secondary | ICD-10-CM | POA: Diagnosis present

## 2016-09-02 LAB — PREPARE PLATELET PHERESIS: Unit division: 0

## 2016-09-02 LAB — TYPE AND SCREEN
ABO/RH(D): O NEG
Antibody Screen: NEGATIVE

## 2016-09-02 LAB — ABO/RH: ABO/RH(D): O NEG

## 2016-09-02 LAB — CBC
HEMATOCRIT: 30.1 % — AB (ref 39.0–52.0)
HEMOGLOBIN: 9.9 g/dL — AB (ref 13.0–17.0)
MCH: 30.3 pg (ref 26.0–34.0)
MCHC: 32.9 g/dL (ref 30.0–36.0)
MCV: 92 fL (ref 78.0–100.0)
Platelets: 253 10*3/uL (ref 150–400)
RBC: 3.27 MIL/uL — ABNORMAL LOW (ref 4.22–5.81)
RDW: 14.4 % (ref 11.5–15.5)
WBC: 10.1 10*3/uL (ref 4.0–10.5)

## 2016-09-02 MED ORDER — POTASSIUM CHLORIDE CRYS ER 20 MEQ PO TBCR
40.0000 meq | EXTENDED_RELEASE_TABLET | Freq: Once | ORAL | Status: AC
  Administered 2016-09-02: 40 meq via ORAL
  Filled 2016-09-02: qty 2

## 2016-09-02 NOTE — Progress Notes (Addendum)
PROGRESS NOTE    Kenneth Stark  [id number] DOB: 1925/08/14 DOA: 09/01/2016 PCP: Geoffery Lyons, MD   Chief Complaint  Patient presents with  . Fall    Brief Narrative:  HPI on 09/01/2016 by Dr. Loma Boston Kenneth Stark is a 82 y.o. male with a history of cerebrovascular disease on Plavix, TIA, degenerative joint disease, hypertension, essential tremor, arthritis, hyperlipidemia. Patient presents to the emergency department following a fall shortly prior to arrival in the emergency department. Patient hit the left aspect of his head and sustained a left ear laceration. Denies loss of consciousness, back pain, abdominal pain, extremity pain, numbness, or weakness. Does have pain to his left ear and mild neck pain. Assessment & Plan   Subdural hematoma -Secondary to fall -CT head upon admission showed left subdural hematoma, 8 mm -Repeat head CT on 09/02/2016 showed unchanged appearance of hematoma, no midline shift -Patient given 2 units of platelets -Neurosurgery, Dr. Cyndy Freeze, consulted and appreciated recommended holding Plavix for an additional week . Will need follow up in 4 weeks with follow up head CT -PT and OT consulted, PT recommended SNF  Laceration of the left ear -ENT, Dr. Benjamine Mola, consulted and appreciated. -Status post repair, follow-up within 7-10 days for suture removal  Anemia -Hemoglobin dropped from 12 upon admission to 9.9 -Continue to monitor CBC Essential hypertension  History of TIA -[name] currently held  Hypokalemia -Continue to replace and monitor BMP  ?Dementia -Continue Aricept, Namenda  Essential hypertension -Continue amlodipine, HCTZ, Avapro  DVT Prophylaxis  SCDs  Code Status: Full  Family Communication: None at bedside  Disposition Plan: Observation  Consultants Neurosurgery ENT  Procedures  None  Antibiotics   Anti-infectives    None      Subjective:   Kenneth Stark seen and examined today.  Patient  states he still feels weak but wishes to go home. Currently denies any chest pain, shortness breath, abdominal pain, nausea or vomiting, diarrhea constipation.    Objective:   Vitals:   09/01/16 2345 09/02/16 0323 09/02/16 0720 09/02/16 1100  BP: (!) 129/50  (!) 146/51 (!) 135/55  Pulse: 76  69 66  Resp: 16  17 (!) 22  Temp: 99 F (37.2 C) 99.7 F (37.6 C) 98.4 F (36.9 C) 98.5 F (36.9 C)  TempSrc: Oral Oral Oral Oral  SpO2: 96%  96% 97%  Weight:      Height:        Intake/Output Summary (Last 24 hours) at 09/02/16 1139 Last data filed at 09/02/16 0720  Gross per 24 hour  Intake              240 ml  Output              350 ml  Net             -110 ml   Filed Weights   09/01/16 2045  Weight: 79.4 kg (175 lb 0.7 oz)    Exam  General: Well developed, well nourished, NAD, appears stated age  HEENT: NCAT, PERRLA, EOMI, Anicteic Sclera, mucous membranes moist. Left ear status post repair, sutures in place  Cardiovascular: S1 S2 auscultated, 1/6 SEM, RRR  Respiratory: Clear to auscultation bilaterally with equal chest rise  Abdomen: Soft, nontender, nondistended, + bowel sounds  Extremities: warm dry without cyanosis clubbing or edema  Neuro: AAOx3, nonfocal, generalized weakness  Skin: Without rashes exudates or nodules, multiple bruises  Psych: Normal affect and demeanor with intact judgement and insight  Data Reviewed: I have personally reviewed following labs and imaging studies  CBC:  Recent Labs Lab 09/01/16 1201 09/01/16 2058 09/02/16 0414  WBC 17.4* 9.7 10.1  NEUTROABS 15.2*  --   --   HGB 12.0* 9.9* 9.9*  HCT 35.8* 29.5* 30.1*  MCV 92.0 91.3 92.0  PLT 220 [id number]   Basic Metabolic Panel:  Recent Labs Lab 09/01/16 1201  NA 138  K 3.1*  CL 104  CO2 24  GLUCOSE 210*  BUN 21*  CREATININE 0.94  CALCIUM 9.7   GFR: Estimated Creatinine Clearance: 52.6 mL/min (by C-G formula based on SCr of 0.94 mg/dL). Liver Function Tests: No results  for input(s): AST, ALT, ALKPHOS, BILITOT, PROT, ALBUMIN in the last 168 hours. No results for input(s): LIPASE, AMYLASE in the last 168 hours. No results for input(s): AMMONIA in the last 168 hours. Coagulation Profile:  Recent Labs Lab 09/01/16 1201  INR 1.07   Cardiac Enzymes: No results for input(s): CKTOTAL, CKMB, CKMBINDEX, TROPONINI in the last 168 hours. BNP (last 3 results) No results for input(s): PROBNP in the last 8760 hours. HbA1C: No results for input(s): HGBA1C in the last 72 hours. CBG: No results for input(s): GLUCAP in the last 168 hours. Lipid Profile: No results for input(s): CHOL, HDL, LDLCALC, TRIG, CHOLHDL, LDLDIRECT in the last 72 hours. Thyroid Function Tests: No results for input(s): TSH, T4TOTAL, FREET4, T3FREE, THYROIDAB in the last 72 hours. Anemia Panel: No results for input(s): VITAMINB12, FOLATE, FERRITIN, TIBC, IRON, RETICCTPCT in the last 72 hours. Urine analysis: No results found for: COLORURINE, APPEARANCEUR, LABSPEC, PHURINE, GLUCOSEU, HGBUR, BILIRUBINUR, KETONESUR, PROTEINUR, UROBILINOGEN, NITRITE, LEUKOCYTESUR Sepsis Labs: @LABRCNTIP (procalcitonin:4,lacticidven:4)  ) Recent Results (from the past 240 hour(s))  MRSA PCR Screening     Status: None   Collection Time: 09/01/16  8:58 PM  Result Value Ref Range Status   MRSA by PCR NEGATIVE NEGATIVE Final    Comment:        The GeneXpert MRSA Assay (FDA approved for NASAL specimens only), is one component of a comprehensive MRSA colonization surveillance program. It is not intended to diagnose MRSA infection nor to guide or monitor treatment for MRSA infections.       Radiology Studies: Ct Head Wo Contrast  Result Date: 09/02/2016 CLINICAL DATA:  Subdural hemorrhage after fall EXAM: CT HEAD WITHOUT CONTRAST TECHNIQUE: Contiguous axial images were obtained from the base of the skull through the vertex without intravenous contrast. COMPARISON:  Head CT 09/01/2016 FINDINGS: Brain:  Subdural and subarachnoid blood over the left convexity is unchanged, with the subdural component measuring 7 mm in greatest thickness. There is persistent blood layering along the left tentorial leaflet. There is now intraventricular extension of hemorrhage without hydrocephalus. No new mass effect or midline shift. Basal cisterns remain patent. There is no new area of hemorrhage. Vascular: No hyperdense vessel or unexpected calcification. Skull: Normal visualized skull base and calvarium. Decreased left scalp soft tissue swelling. Sinuses/Orbits: No sinus fluid levels or advanced mucosal thickening. No mastoid effusion. Normal orbits. IMPRESSION: 1. Unchanged appearance of left convexity subdural and subarachnoid blood. 2. Development of intraventricular extension of hemorrhage with blood layering in the occipital horns of the lateral ventricles. No hydrocephalus. 3. No new area of hemorrhage, new mass effect or midline shift. Electronically Signed   By: Ulyses Jarred M.D.   On: 09/02/2016 05:57   Ct Head Wo Contrast  Result Date: 09/01/2016 CLINICAL DATA:  Fall, complex left ear laceration EXAM: CT HEAD WITHOUT CONTRAST CT  CERVICAL SPINE WITHOUT CONTRAST TECHNIQUE: Multidetector CT imaging of the head and cervical spine was performed following the standard protocol without intravenous contrast. Multiplanar CT image reconstructions of the cervical spine were also generated. COMPARISON:  MRI brain dated 09/30/2014 FINDINGS: CT HEAD FINDINGS Brain: Subdural hematoma along the left frontal convexity measuring up to 8 mm in thickness (series 2/image 21). Underlying subarachnoid hemorrhage along the left frontoparietal region and extending into the sylvian fissure (series 2/image 16). Associated subdural/subarachnoid hemorrhage extending along the anterior left temporal lobe in the middle cranial fossa (series 2/image 10). Mild subdural hematoma along the left tentorium (series 2/image 16). No evidence of acute  infarction, hydrocephalus, or mass lesion/mass effect. No intraventricular hemorrhage. Basal cisterns remain patent. Global cortical atrophy.  Secondary ventricular prominence. Mild subcortical white matter and periventricular small vessel ischemic changes. Intracranial atherosclerosis. Vascular: No hyperdense vessel or unexpected calcification. Skull: Normal. Negative for fracture or focal lesion. Sinuses/Orbits: The visualized paranasal sinuses are essentially clear. The mastoid air cells are unopacified. Other: Soft tissue laceration involving the left ear (series 2/ image 10). Overlying dressing. CT CERVICAL SPINE FINDINGS Alignment: Normal. Skull base and vertebrae: No acute fracture. No primary bone lesion or focal pathologic process. Soft tissues and spinal canal: No prevertebral fluid or swelling. No visible canal hematoma. Disc levels: Mild degenerative changes of the mid cervical spine. Spinal canal remains patent. Upper chest: Visualized lung apices are clear. Other: Visualized thyroid is unremarkable. IMPRESSION: Left frontal subdural hematoma measuring up to 8 mm. Additional subdural/subarachnoid hemorrhage on the left, as described above. No midline shift. Basal cisterns remain patent. No intraventricular hemorrhage. No evidence of traumatic injury to the cervical spine. Critical Value/emergent results were called by telephone at the time of interpretation on 09/01/2016 at 12:03 pm to [facility], who verbally acknowledged these results. Electronically Signed   By: Julian Hy M.D.   On: 09/01/2016 12:04   Ct Cervical Spine Wo Contrast  Result Date: 09/01/2016 CLINICAL DATA:  Fall, complex left ear laceration EXAM: CT HEAD WITHOUT CONTRAST CT CERVICAL SPINE WITHOUT CONTRAST TECHNIQUE: Multidetector CT imaging of the head and cervical spine was performed following the standard protocol without intravenous contrast. Multiplanar CT image reconstructions of the cervical spine were also generated.  COMPARISON:  MRI brain dated 09/30/2014 FINDINGS: CT HEAD FINDINGS Brain: Subdural hematoma along the left frontal convexity measuring up to 8 mm in thickness (series 2/image 21). Underlying subarachnoid hemorrhage along the left frontoparietal region and extending into the sylvian fissure (series 2/image 16). Associated subdural/subarachnoid hemorrhage extending along the anterior left temporal lobe in the middle cranial fossa (series 2/image 10). Mild subdural hematoma along the left tentorium (series 2/image 16). No evidence of acute infarction, hydrocephalus, or mass lesion/mass effect. No intraventricular hemorrhage. Basal cisterns remain patent. Global cortical atrophy.  Secondary ventricular prominence. Mild subcortical white matter and periventricular small vessel ischemic changes. Intracranial atherosclerosis. Vascular: No hyperdense vessel or unexpected calcification. Skull: Normal. Negative for fracture or focal lesion. Sinuses/Orbits: The visualized paranasal sinuses are essentially clear. The mastoid air cells are unopacified. Other: Soft tissue laceration involving the left ear (series 2/ image 10). Overlying dressing. CT CERVICAL SPINE FINDINGS Alignment: Normal. Skull base and vertebrae: No acute fracture. No primary bone lesion or focal pathologic process. Soft tissues and spinal canal: No prevertebral fluid or swelling. No visible canal hematoma. Disc levels: Mild degenerative changes of the mid cervical spine. Spinal canal remains patent. Upper chest: Visualized lung apices are clear. Other: Visualized thyroid is unremarkable. IMPRESSION: Left  frontal subdural hematoma measuring up to 8 mm. Additional subdural/subarachnoid hemorrhage on the left, as described above. No midline shift. Basal cisterns remain patent. No intraventricular hemorrhage. No evidence of traumatic injury to the cervical spine. Critical Value/emergent results were called by telephone at the time of interpretation on 09/01/2016  at 12:03 pm to [facility], who verbally acknowledged these results. Electronically Signed   By: Julian Hy M.D.   On: 09/01/2016 12:04     Scheduled Meds: . sodium chloride   Intravenous Once  . amLODipine  10 mg Oral Daily  . donepezil  10 mg Oral QHS  . escitalopram  10 mg Oral Daily  . hydrochlorothiazide  25 mg Oral Daily  . irbesartan  300 mg Oral Daily  . memantine  28 mg Oral QHS  . mirtazapine  30 mg Oral QHS  . omega-3 acid ethyl esters  1 g Oral BID  . potassium chloride  10 mEq Oral Daily  . pravastatin  20 mg Oral QHS  . tamsulosin  0.4 mg Oral QPC supper  . triamcinolone  2 spray Nasal QHS   Continuous Infusions:   LOS: 0 days   Time Spent in minutes   30 minutes  Ellison Rieth D.O. on 09/02/2016 at 11:39 AM  Between 7am to 7pm - Pager - [phone]  After 7pm go to [email or web address] - password [email or web address]  And look for the night coverage person covering for me after hours  [facility] Office  [phone]

## 2016-09-02 NOTE — Progress Notes (Signed)
No acute overnight events Awake and alert Oriented Moves all extremities well, no focal weakness CT shows stable subdural and subarachnoid hemorrhage Ok for D/c from my perspective Resume antiplatelet agents in one week Follow up with me in 4 weeks with a head CT

## 2016-09-02 NOTE — Evaluation (Signed)
Physical Therapy Evaluation Patient Details Name: Kenneth Stark MRN: [id number] DOB: 1924-10-25 Today's Date: 09/02/2016   History of Present Illness  Patient is a 82 y/o male with hx of HTN, essential tremor, dyslipidemia presents s/p fall and found to have left SDH and SAH managing conservatively.  Clinical Impression  Patient presents with pain, generalized weakness, deconditioning, impaired balance, speech difficulties and impaired mobility s/p above. 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 Pt very unsteady on feet and requires hands on assist for transfers and safety. HIGH FALL RISK. Pt not safe to return home without support. If pt declines SNF, will need to maximize [place], OT, aide. Would benefit from SNF to maximize independence and mobility prior to return home. Will follow.     Follow Up Recommendations SNF    Equipment Recommendations  3in1 (PT)    Recommendations for Other Services OT consult     Precautions / Restrictions Precautions Precautions: Fall Restrictions Weight Bearing Restrictions: No      Mobility  Bed Mobility Overal bed mobility: Needs Assistance Bed Mobility: Supine to Sit     Supine to sit: Mod assist;HOB elevated     General bed mobility comments: Assist to elevate trunk and scoot bottom to EOB despite cues for technique.  Transfers Overall transfer level: Needs assistance Equipment used: Rolling walker (2 wheeled) Transfers: Sit to/from Stand Sit to Stand: Mod assist         General transfer comment: Assist to boost to standing with cues for hand placement/technique. Transferred to chair post ambulation bout.  Ambulation/Gait Ambulation/Gait assistance: Min assist Ambulation Distance (Feet): 50 Feet Assistive device: Rolling walker (2 wheeled) Gait Pattern/deviations: Step-through pattern;Decreased stride length;Decreased step length - right;Decreased step length - left;Shuffle;Trunk  flexed;Narrow base of support Gait velocity: decreased   General Gait Details: Slow, unsteady shuffling like gait pattern with Min A for balance, varying speeds noted. Fatigues. VSS.  Stairs            Wheelchair Mobility    Modified Rankin (Stroke Patients Only)       Balance Overall balance assessment: Needs assistance;History of Falls Sitting-balance support: Feet supported;No upper extremity supported Sitting balance-Leahy Scale: Fair     Standing balance support: During functional activity Standing balance-Leahy Scale: Poor Standing balance comment: Reliant on BUEs for support in standing.                             Pertinent Vitals/Pain Pain Assessment: Faces Faces Pain Scale: Hurts a little bit Pain Location: headache Pain Descriptors / Indicators: Headache Pain Intervention(s): Monitored during session;Repositioned;Limited activity within patient's tolerance    Home Living Family/patient expects to be discharged to:: Private residence Living Arrangements: Spouse/significant other Available Help at Discharge: Family (spouse helps care for wife) Type of Home: House Home Access: Level entry     Home Layout: One level Home Equipment: Environmental consultant - 2 wheels;Cane - single point      Prior Function Level of Independence: Independent with assistive device(s)         Comments: Pt uses [facility] PTA, drives. helps care for wife. Reports some falls at home.     Hand Dominance        Extremity/Trunk Assessment   Upper Extremity Assessment: Defer to OT evaluation           Lower Extremity Assessment: Generalized weakness         Communication  Communication: HOH  Cognition Arousal/Alertness: Awake/alert Behavior During Therapy: WFL for tasks assessed/performed Overall Cognitive Status: Impaired/Different from baseline Area of Impairment: Safety/judgement;Problem solving         Safety/Judgement: Decreased awareness of  safety;Decreased awareness of deficits   Problem Solving: Decreased initiation;Slow processing;Difficulty sequencing;Requires verbal cues General Comments: Reports he feels weak and wobbly in his legs but still wants to return home. Some speech difficulties noted- word finding and mixing up words. MD and RN notified.    General Comments      Exercises     Assessment/Plan    PT Assessment Patient needs continued PT services  PT Problem List Decreased strength;Decreased mobility;Decreased safety awareness;Decreased activity tolerance;Decreased balance;Pain;Decreased cognition;Decreased skin integrity          PT Treatment Interventions DME instruction;Therapeutic activities;Gait training;Therapeutic exercise;Patient/family education;Balance training;Functional mobility training    PT Goals (Current goals can be found in the Care Plan section)  Acute Rehab PT Goals Patient Stated Goal: to go home PT Goal Formulation: With patient Time For Goal Achievement: 09/16/16 Potential to Achieve Goals: Fair    Frequency Min 3X/week   Barriers to discharge Decreased caregiver support cares for wife at home and does not have support    Co-evaluation               End of Session Equipment Utilized During Treatment: Gait belt Activity Tolerance: Patient limited by fatigue Patient left: in chair;with call bell/phone within reach;with chair alarm set Nurse Communication: Mobility status    Functional Assessment Tool Used: clinical judgment Functional Limitation: Mobility: Walking and moving around Mobility: Walking and Moving Around Current Status [phone]): At least 40 percent but less than 60 percent impaired, limited or restricted Mobility: Walking and Moving Around Goal Status [phone]): At least 20 percent but less than 40 percent impaired, limited or restricted    Time: 0751-0815 PT Time Calculation (min) (ACUTE ONLY): 24 min   Charges:   PT Evaluation $PT Eval Moderate  Complexity: 1 Procedure PT Treatments $Gait Training: 8-22 mins   PT G Codes:   PT G-Codes **NOT FOR INPATIENT CLASS** Functional Assessment Tool Used: clinical judgment Functional Limitation: Mobility: Walking and moving around Mobility: Walking and Moving Around Current Status [phone]): At least 40 percent but less than 60 percent impaired, limited or restricted Mobility: Walking and Moving Around Goal Status [phone]): At least 20 percent but less than 40 percent impaired, limited or restricted    Kenneth Stark A Stein Windhorst 09/02/2016, 8:43 AM  Kenneth Stark, PT, DPT [phone]

## 2016-09-03 DIAGNOSIS — E876 Hypokalemia: Secondary | ICD-10-CM

## 2016-09-03 LAB — CBC
HCT: 31.8 % — ABNORMAL LOW (ref 39.0–52.0)
Hemoglobin: 10.7 g/dL — ABNORMAL LOW (ref 13.0–17.0)
MCH: 30.9 pg (ref 26.0–34.0)
MCHC: 33.6 g/dL (ref 30.0–36.0)
MCV: 91.9 fL (ref 78.0–100.0)
PLATELETS: 216 10*3/uL (ref 150–400)
RBC: 3.46 MIL/uL — ABNORMAL LOW (ref 4.22–5.81)
RDW: 14.3 % (ref 11.5–15.5)
WBC: 12.7 10*3/uL — AB (ref 4.0–10.5)

## 2016-09-03 LAB — MAGNESIUM: Magnesium: 1.9 mg/dL (ref 1.7–2.4)

## 2016-09-03 LAB — BASIC METABOLIC PANEL
Anion gap: 9 (ref 5–15)
Anion gap: 8 (ref 5–15)
BUN: 19 mg/dL (ref 6–20)
BUN: 19 mg/dL (ref 6–20)
CALCIUM: 9.2 mg/dL (ref 8.9–10.3)
CALCIUM: 8.9 mg/dL (ref 8.9–10.3)
CHLORIDE: 103 mmol/L (ref 101–111)
CO2: 26 mmol/L (ref 22–32)
CO2: 27 mmol/L (ref 22–32)
CREATININE: 1.01 mg/dL (ref 0.61–1.24)
CREATININE: 0.89 mg/dL (ref 0.61–1.24)
Chloride: 103 mmol/L (ref 101–111)
GFR calc Af Amer: >60 mL/min (ref >60)
GFR calc Af Amer: >60 mL/min (ref >60)
GFR calc non Af Amer: >60 mL/min (ref >60)
GFR calc non Af Amer: >60 mL/min (ref >60)
GLUCOSE: 234 mg/dL — AB (ref 65–99)
Glucose, Bld: 195 mg/dL — ABNORMAL HIGH (ref 65–99)
Potassium: 2.8 mmol/L — ABNORMAL LOW (ref 3.5–5.1)
Potassium: 3.3 mmol/L — ABNORMAL LOW (ref 3.5–5.1)
SODIUM: 138 mmol/L (ref 135–145)
Sodium: 138 mmol/L (ref 135–145)

## 2016-09-03 MED ORDER — ACETAMINOPHEN 325 MG PO TABS: 650.0000 mg | ORAL_TABLET | Freq: Four times a day (QID) | ORAL | Status: DC | PRN

## 2016-09-03 MED ORDER — POTASSIUM CHLORIDE CRYS ER 20 MEQ PO TBCR
40.0000 meq | EXTENDED_RELEASE_TABLET | Freq: Two times a day (BID) | ORAL | Status: DC
  Administered 2016-09-03: 40 meq via ORAL
  Filled 2016-09-03: qty 2

## 2016-09-03 NOTE — Discharge Instructions (Signed)
Subdural Hematoma A subdural hematoma is a collection of blood between the brain and its tough outermost membrane covering (the dura). Blood clots that form in this area push down on the brain and cause irritation. A subdural hematoma may cause parts of the brain to stop working and eventually cause death.  CAUSES A subdural hematoma is caused by bleeding from a ruptured blood vessel (hemorrhage). The bleeding results from trauma to the head, such as from a fall or motor vehicle accident. There are two types of subdural hemorrhages:  Acute. This type develops shortly after a serious blow to the head and causes blood to collect very quickly. If not diagnosed and treated promptly, severe brain injury or death can occur.  Chronic. This is when bleeding develops more slowly, over weeks or months. RISK FACTORS People at risk for subdural hematoma include older persons, infants, and alcoholics. SYMPTOMS An acute subdural hemorrhage develops over minutes to hours. Symptoms can include:  Temporary loss of consciousness.  Weakness of arms or legs on one side of the body.  Changes in vision or speech.  A severe headache.  Seizures.  Nausea and vomiting.  Increased sleepiness. A chronic subdural hemorrhage develops over weeks to months. Symptoms may develop slowly and produce less noticeable problems or changes. Symptoms include:  A mild headache.  A change in personality.  Loss of balance or difficulty walking.  Weakness, numbness, or tingling in the arms or legs.  Nausea or vomiting.  Memory loss.  Double vision.  Increased sleepiness. DIAGNOSIS Your health care provider will perform a thorough physical and neurological exam. A CT scan or MRI may also be done. If there is blood on the scan, its color will help your health care provider determine how long the hemorrhage has been there. TREATMENT If the cause is an acute subdural hemorrhage, immediate treatment is needed. In many  cases an emergency surgery is performed to drain accumulated blood or to remove the blood clot. Sometimes steroid or diuretic medicines or controlled breathing through a ventilator is needed to decrease pressure in the brain. This is especially true if there is any swelling of the brain. If the cause is a chronic subdural hemorrhage, treatment depends on a variety of factors. Sometimes no treatment is needed. If the subdural hematoma is small and causes minimal or no symptoms, you may be treated with bed rest, medicines, and observation. If the hemorrhage is large or if you have neurological symptoms, an emergency surgery is usually needed to remove the blood clot. People who develop a subdural hemorrhage are at risk of developing seizures, even after the subdural hematoma has been treated. You may be prescribed an anti-seizure (anticonvulsant) medicine for a year or longer. HOME CARE INSTRUCTIONS  Only take medicines as directed by your health care provider.  Rest if directed by your health care provider.  Keep all follow-up appointments with your health care provider.  If you play a contact sport such as football, hockey or soccer and you experienced a significant head injury, allow enough time for healing (up to 15 days) before you start playing again. A repeated injury that occurs during this fragile repair period is likely to result in hemorrhage. This is called the second impact syndrome. SEEK IMMEDIATE MEDICAL CARE IF:  You fall or experience minor trauma to your head and you are taking blood thinners. If you are on any blood thinners even a very small injury can cause a subdural hematoma. You should not hesitate to seek   medical attention regardless of how minor you think your symptoms are.  You experience a head injury and have:  Drowsiness or a decrease in alertness.  Confusion or forgetfulness.  Slurred speech.  Irrational or aggressive behavior.  Numbness or paralysis in any part  of the body.  A feeling of being sick to your stomach (nauseous) or you throw up (vomit).  Difficulty walking or poor coordination.  Double vision.  Seizures.  A bleeding disorder.  A history of heavy alcohol use.  Clear fluid draining from your nose or ears.  Personality changes.  Difficulty thinking.  Worsening symptoms. MAKE SURE YOU:  Understand these instructions.  Will watch your condition.  Will get help right away if you are not doing well or get worse. FOR MORE INFORMATION National Institute of Neurological Disorders and Stroke: www.ninds.nih.gov American Association of Neurological Surgeons: www.neurosurgerytoday.org American Academy of Neurology (AAN): www.aan.com Brain Injury Association of America: www.biausa.org This information is not intended to replace advice given to you by your health care provider. Make sure you discuss any questions you have with your health care provider. Document Released: 08/19/2004 Document Revised: 07/23/2013 Document Reviewed: 04/04/2013 Elsevier Interactive Patient Education  2017 [employer]vier Inc[employer]  

## 2016-09-03 NOTE — Progress Notes (Signed)
PROGRESS NOTE    Kenneth Stark  [id number] DOB: January 29, 1925 DOA: 09/01/2016 PCP: Geoffery Lyons, MD   Chief Complaint  Patient presents with  . Fall    Brief Narrative:  HPI on 09/01/2016 by Dr. Loma Boston Kenneth Stark is a 82 y.o. male with a history of cerebrovascular disease on Plavix, TIA, degenerative joint disease, hypertension, essential tremor, arthritis, hyperlipidemia. Patient presents to the emergency department following a fall shortly prior to arrival in the emergency department. Patient hit the left aspect of his head and sustained a left ear laceration. Denies loss of consciousness, back pain, abdominal pain, extremity pain, numbness, or weakness. Does have pain to his left ear and mild neck pain. Assessment & Plan   Subdural hematoma -Secondary to fall -CT head upon admission showed left subdural hematoma, 8 mm -Repeat head CT on 09/02/2016 showed unchanged appearance of hematoma, no midline shift -Patient given 2 units of platelets -Neurosurgery, Dr. Cyndy Freeze, consulted and appreciated recommended holding Plavix for an additional week . Will need follow up in 4 weeks with follow up head CT -PT and OT consulted, PT recommended SNF  Laceration of the left ear -ENT, Dr. Benjamine Mola, consulted and appreciated. -Status post repair, follow-up within 7-10 days for suture removal  Anemia -Hemoglobin dropped from 12 upon admission to 10.7 -Continue to monitor CBC  History of TIA -[name] currently held  Hypokalemia -Continue to replace and monitor BMP -Will check magnesium level  ?Dementia -Continue Aricept, Namenda  Essential hypertension -Continue amlodipine, HCTZ, Avapro  DVT Prophylaxis  SCDs  Code Status: Full  Family Communication: None at bedside  Disposition Plan: Admitted. Plan for D/c to [facility]   Consultants Neurosurgery ENT  Procedures  None  Antibiotics   Anti-infectives    None      Subjective:   Kenneth Stark seen and  examined today.  Patient states he still feels weak.  Understands that he may need rehab. Would like some tylenol for his headache and heat for his neck pain. Currently denies any chest pain, shortness breath, abdominal pain, nausea or vomiting, diarrhea constipation.    Objective:   Vitals:   09/02/16 1945 09/03/16 0018 09/03/16 0319 09/03/16 0830  BP: (!) 131/95 (!) 142/64 (!) 134/55 134/60  Pulse: 71 69 70 69  Resp: (!) 25 17 20 15   Temp: 98.3 F (36.8 C) 98.4 F (36.9 C) 98.4 F (36.9 C) 97.3 F (36.3 C)  TempSrc: Oral Oral Oral Oral  SpO2: 93% 93% 99% 94%  Weight:      Height:        Intake/Output Summary (Last 24 hours) at 09/03/16 1116 Last data filed at 09/03/16 1000  Gross per 24 hour  Intake              240 ml  Output              700 ml  Net             -460 ml   Filed Weights   09/01/16 2045  Weight: 79.4 kg (175 lb 0.7 oz)    Exam  General: Well developed, well nourished, no apparent distress  HEENT: NCAT, mucous membranes moist. Left ear status post repair, sutures in place  Cardiovascular: S1 S2 auscultated, 1/6 SEM, RRR  Respiratory: Clear to auscultation bilaterally with equal chest rise  Abdomen: Soft, nontender, nondistended, + bowel sounds  Extremities: warm dry without cyanosis clubbing or edema  Neuro: AAOx3, nonfocal, generalized weakness  Skin: Without rashes  exudates or nodules, multiple bruises  Psych: Normal affect and demeanor    Data Reviewed: I have personally reviewed following labs and imaging studies  CBC:  Recent Labs Lab 09/01/16 1201 09/01/16 2058 09/02/16 0414 09/03/16 0235  WBC 17.4* 9.7 10.1 12.7*  NEUTROABS 15.2*  --   --   --   HGB 12.0* 9.9* 9.9* 10.7*  HCT 35.8* 29.5* 30.1* 31.8*  MCV 92.0 91.3 92.0 91.9  PLT 220 247 253 [id number]   Basic Metabolic Panel:  Recent Labs Lab 09/01/16 1201 09/03/16 0235  NA 138 138  K 3.1* 2.8*  CL 104 103  CO2 24 26  GLUCOSE 210* 195*  BUN 21* 19  CREATININE 0.94 1.01   CALCIUM 9.7 9.2  MG  --  1.9   GFR: Estimated Creatinine Clearance: 48.9 mL/min (by C-G formula based on SCr of 1.01 mg/dL). Liver Function Tests: No results for input(s): AST, ALT, ALKPHOS, BILITOT, PROT, ALBUMIN in the last 168 hours. No results for input(s): LIPASE, AMYLASE in the last 168 hours. No results for input(s): AMMONIA in the last 168 hours. Coagulation Profile:  Recent Labs Lab 09/01/16 1201  INR 1.07   Cardiac Enzymes: No results for input(s): CKTOTAL, CKMB, CKMBINDEX, TROPONINI in the last 168 hours. BNP (last 3 results) No results for input(s): PROBNP in the last 8760 hours. HbA1C: No results for input(s): HGBA1C in the last 72 hours. CBG: No results for input(s): GLUCAP in the last 168 hours. Lipid Profile: No results for input(s): CHOL, HDL, LDLCALC, TRIG, CHOLHDL, LDLDIRECT in the last 72 hours. Thyroid Function Tests: No results for input(s): TSH, T4TOTAL, FREET4, T3FREE, THYROIDAB in the last 72 hours. Anemia Panel: No results for input(s): VITAMINB12, FOLATE, FERRITIN, TIBC, IRON, RETICCTPCT in the last 72 hours. Urine analysis: No results found for: COLORURINE, APPEARANCEUR, LABSPEC, [place], GLUCOSEU, HGBUR, BILIRUBINUR, KETONESUR, PROTEINUR, UROBILINOGEN, NITRITE, LEUKOCYTESUR Sepsis Labs: @LABRCNTIP (procalcitonin:4,lacticidven:4)  ) Recent Results (from the past 240 hour(s))  MRSA PCR Screening     Status: None   Collection Time: 09/01/16  8:58 PM  Result Value Ref Range Status   MRSA by PCR NEGATIVE NEGATIVE Final    Comment:        The GeneXpert MRSA Assay (FDA approved for NASAL specimens only), is one component of a comprehensive MRSA colonization surveillance program. It is not intended to diagnose MRSA infection nor to guide or monitor treatment for MRSA infections.       Radiology Studies: Ct Head Wo Contrast  Result Date: 09/02/2016 CLINICAL DATA:  Subdural hemorrhage after fall EXAM: CT HEAD WITHOUT CONTRAST TECHNIQUE:  Contiguous axial images were obtained from the base of the skull through the vertex without intravenous contrast. COMPARISON:  Head CT 09/01/2016 FINDINGS: Brain: Subdural and subarachnoid blood over the left convexity is unchanged, with the subdural component measuring 7 mm in greatest thickness. There is persistent blood layering along the left tentorial leaflet. There is now intraventricular extension of hemorrhage without hydrocephalus. No new mass effect or midline shift. Basal cisterns remain patent. There is no new area of hemorrhage. Vascular: No hyperdense vessel or unexpected calcification. Skull: Normal visualized skull base and calvarium. Decreased left scalp soft tissue swelling. Sinuses/Orbits: No sinus fluid levels or advanced mucosal thickening. No mastoid effusion. Normal orbits. IMPRESSION: 1. Unchanged appearance of left convexity subdural and subarachnoid blood. 2. Development of intraventricular extension of hemorrhage with blood layering in the occipital horns of the lateral ventricles. No hydrocephalus. 3. No new area of hemorrhage, new mass effect or  midline shift. Electronically Signed   By: Ulyses Jarred M.D.   On: 09/02/2016 05:57   Ct Head Wo Contrast  Result Date: 09/01/2016 CLINICAL DATA:  Fall, complex left ear laceration EXAM: CT HEAD WITHOUT CONTRAST CT CERVICAL SPINE WITHOUT CONTRAST TECHNIQUE: Multidetector CT imaging of the head and cervical spine was performed following the standard protocol without intravenous contrast. Multiplanar CT image reconstructions of the cervical spine were also generated. COMPARISON:  MRI brain dated 09/30/2014 FINDINGS: CT HEAD FINDINGS Brain: Subdural hematoma along the left frontal convexity measuring up to 8 mm in thickness (series 2/image 21). Underlying subarachnoid hemorrhage along the left frontoparietal region and extending into the sylvian fissure (series 2/image 16). Associated subdural/subarachnoid hemorrhage extending along the  anterior left temporal lobe in the middle cranial fossa (series 2/image 10). Mild subdural hematoma along the left tentorium (series 2/image 16). No evidence of acute infarction, hydrocephalus, or mass lesion/mass effect. No intraventricular hemorrhage. Basal cisterns remain patent. Global cortical atrophy.  Secondary ventricular prominence. Mild subcortical white matter and periventricular small vessel ischemic changes. Intracranial atherosclerosis. Vascular: No hyperdense vessel or unexpected calcification. Skull: Normal. Negative for fracture or focal lesion. Sinuses/Orbits: The visualized paranasal sinuses are essentially clear. The mastoid air cells are unopacified. Other: Soft tissue laceration involving the left ear (series 2/ image 10). Overlying dressing. CT CERVICAL SPINE FINDINGS Alignment: Normal. Skull base and vertebrae: No acute fracture. No primary bone lesion or focal pathologic process. Soft tissues and spinal canal: No prevertebral fluid or swelling. No visible canal hematoma. Disc levels: Mild degenerative changes of the mid cervical spine. Spinal canal remains patent. Upper chest: Visualized lung apices are clear. Other: Visualized thyroid is unremarkable. IMPRESSION: Left frontal subdural hematoma measuring up to 8 mm. Additional subdural/subarachnoid hemorrhage on the left, as described above. No midline shift. Basal cisterns remain patent. No intraventricular hemorrhage. No evidence of traumatic injury to the cervical spine. Critical Value/emergent results were called by telephone at the time of interpretation on 09/01/2016 at 12:03 pm to [facility], who verbally acknowledged these results. Electronically Signed   By: Julian Hy M.D.   On: 09/01/2016 12:04   Ct Cervical Spine Wo Contrast  Result Date: 09/01/2016 CLINICAL DATA:  Fall, complex left ear laceration EXAM: CT HEAD WITHOUT CONTRAST CT CERVICAL SPINE WITHOUT CONTRAST TECHNIQUE: Multidetector CT imaging of the head and  cervical spine was performed following the standard protocol without intravenous contrast. Multiplanar CT image reconstructions of the cervical spine were also generated. COMPARISON:  MRI brain dated 09/30/2014 FINDINGS: CT HEAD FINDINGS Brain: Subdural hematoma along the left frontal convexity measuring up to 8 mm in thickness (series 2/image 21). Underlying subarachnoid hemorrhage along the left frontoparietal region and extending into the sylvian fissure (series 2/image 16). Associated subdural/subarachnoid hemorrhage extending along the anterior left temporal lobe in the middle cranial fossa (series 2/image 10). Mild subdural hematoma along the left tentorium (series 2/image 16). No evidence of acute infarction, hydrocephalus, or mass lesion/mass effect. No intraventricular hemorrhage. Basal cisterns remain patent. Global cortical atrophy.  Secondary ventricular prominence. Mild subcortical white matter and periventricular small vessel ischemic changes. Intracranial atherosclerosis. Vascular: No hyperdense vessel or unexpected calcification. Skull: Normal. Negative for fracture or focal lesion. Sinuses/Orbits: The visualized paranasal sinuses are essentially clear. The mastoid air cells are unopacified. Other: Soft tissue laceration involving the left ear (series 2/ image 10). Overlying dressing. CT CERVICAL SPINE FINDINGS Alignment: Normal. Skull base and vertebrae: No acute fracture. No primary bone lesion or focal pathologic process. Soft tissues  and spinal canal: No prevertebral fluid or swelling. No visible canal hematoma. Disc levels: Mild degenerative changes of the mid cervical spine. Spinal canal remains patent. Upper chest: Visualized lung apices are clear. Other: Visualized thyroid is unremarkable. IMPRESSION: Left frontal subdural hematoma measuring up to 8 mm. Additional subdural/subarachnoid hemorrhage on the left, as described above. No midline shift. Basal cisterns remain patent. No  intraventricular hemorrhage. No evidence of traumatic injury to the cervical spine. Critical Value/emergent results were called by telephone at the time of interpretation on 09/01/2016 at 12:03 pm to [facility], who verbally acknowledged these results. Electronically Signed   By: Julian Hy M.D.   On: 09/01/2016 12:04     Scheduled Meds: . sodium chloride   Intravenous Once  . amLODipine  10 mg Oral Daily  . donepezil  10 mg Oral QHS  . escitalopram  10 mg Oral Daily  . hydrochlorothiazide  25 mg Oral Daily  . irbesartan  300 mg Oral Daily  . memantine  28 mg Oral QHS  . mirtazapine  30 mg Oral QHS  . omega-3 acid ethyl esters  1 g Oral BID  . potassium chloride  10 mEq Oral Daily  . potassium chloride  40 mEq Oral BID  . pravastatin  20 mg Oral QHS  . tamsulosin  0.4 mg Oral QPC supper  . triamcinolone  2 spray Nasal QHS   Continuous Infusions:   LOS: 1 day   Time Spent in minutes   30 minutes  Navarre Diana D.O. on 09/03/2016 at 11:16 AM  Between 7am to 7pm - Pager - [phone]  After 7pm go to [email or web address] - password [email or web address]  And look for the night coverage person covering for me after hours  [facility] Office  [phone]

## 2016-09-03 NOTE — Discharge Summary (Signed)
Physician Discharge Summary  Kenneth Stark [id number] DOB: Mar 01, 1977 DOA: 09/01/2016  PCP: Geoffery Lyons, MD  Admit date: 09/01/2016 Discharge date: 09/03/2016  Time spent: 45 minutes  Recommendations for Outpatient Follow-up:  Patient will be discharged to [facility] skilled nursing facility.  Patient will need to follow up with primary care provider within one week of discharge.  Restart plavix and aspirin in one week. Follow up with Dr. Cyndy Freeze in 4 weeks and have repeat CT head.  Follow up with Dr. Benjamine Mola, ENT, in 7-10 days for suture removal.  Patient should continue medications as prescribed.  Patient should follow a heart healthy diet.   Discharge Diagnoses:  Principal Problem:   Subdural hematoma (HCC) Active Problems:   Complex laceration of left ear   Essential hypertension   Cerebrovascular disease   History of TIA (transient ischemic attack)   Discharge Condition: Stable  Diet recommendation: heart healthy  Filed Weights   09/01/16 2045  Weight: 79.4 kg (175 lb 0.7 oz)    History of present illness:  on 09/01/2016 by Dr. Loralee Pacas Collinsis a 82 y.o.malewith a history of cerebrovascular disease on Plavix, TIA, degenerative joint disease, hypertension, essential tremor, arthritis, hyperlipidemia. Patient presents to the emergency department following a fall shortly prior to arrival in the emergency department. Patient hit the left aspect of his head and sustained a left ear laceration. Denies loss of consciousness, back pain, abdominal pain, extremity pain, numbness, or weakness. Does have pain to his left ear and mild neck pain.  Hospital Course:  Subdural hematoma -Secondary to fall -CT head upon admission showed left subdural hematoma, 8 mm -Repeat head CT on 09/02/2016 showed unchanged appearance of hematoma, no midline shift -Patient given 2 units of platelets -Neurosurgery, Dr. Cyndy Freeze, consulted and appreciated recommended holding  Plavix for an additional week . Will need follow up in 4 weeks with follow up head CT -PT and OT consulted, PT recommended SNF  Laceration of the left ear -ENT, Dr. Benjamine Mola, consulted and appreciated. -Status post repair, follow-up within 7-10 days for suture removal  Anemia -Hemoglobin dropped from 12 upon admission to 10.7 -Continue to monitor CBC  History of TIA -[name] currently held  Hypokalemia -Continue to replace and monitor BMP -Will check magnesium level  ?Dementia -Continue Aricept, Namenda  Essential hypertension -Continue amlodipine, HCTZ, Avapro  Consultants Neurosurgery ENT  Procedures  None  Discharge Exam: Vitals:   09/03/16 0830 09/03/16 1100  BP: 134/60 (!) 132/52  Pulse: 69 73  Resp: 15 20  Temp: 97.3 F (36.3 C) 97.9 F (36.6 C)   Currently denies any chest pain, shortness breath, abdominal pain, nausea or vomiting, diarrhea constipation.    Exam  General: Well developed, well nourished, no apparent distress  HEENT: NCAT, mucous membranes moist. Left ear status post repair, sutures in place  Cardiovascular: S1 S2 auscultated, 1/6 SEM, RRR  Respiratory: Clear to auscultation bilaterally with equal chest rise  Abdomen: Soft, nontender, nondistended, + bowel sounds  Extremities: warm dry without cyanosis clubbing or edema  Neuro: AAOx3, nonfocal, generalized weakness  Skin: Without rashes exudates or nodules, multiple bruises  Psych: Normal affect and demeanor  Discharge Instructions Discharge Instructions    Discharge instructions    Complete by:  As directed    Patient will be discharged to skilled nursing facility.  Patient will need to follow up with primary care provider within one week of discharge.  Restart plavix in one week. Follow up with Dr. Cyndy Freeze in 4 weeks  and have repeat CT head.  Follow up with Dr. Benjamine Mola, ENT, in 7-10 days for suture removal.  Patient should continue medications as prescribed.  Patient should follow a  heart healthy diet.     Current Discharge Medication List    START taking these medications   Details  acetaminophen (TYLENOL) 325 MG tablet Take 2 tablets (650 mg total) by mouth every 6 (six) hours as needed for mild pain (or Fever >/= 101).      CONTINUE these medications which have NOT CHANGED   Details  amLODipine (NORVASC) 10 MG tablet Take 10 mg by mouth daily.    Artificial Tear Ointment (EYE LUBRICANT OP) Place 1 drop into both eyes daily.    Ascorbic Acid (VITAMIN C) 1000 MG tablet Take 1,000 mg by mouth 3 (three) times daily.     Calcium Carbonate-Vitamin D (CALTRATE 600+D PO) Take 600 mg by mouth 2 (two) times daily.     Co-Enzyme Q-10 100 MG CAPS Take 100 mg by mouth daily.    escitalopram (LEXAPRO) 10 MG tablet Take 10 mg by mouth daily.    hydrochlorothiazide (HYDRODIURIL) 25 MG tablet Take 25 mg by mouth daily.    Lutein-Zeaxanthin 25-5 MG CAPS Take 1 capsule by mouth daily.    Memantine HCl-Donepezil HCl (NAMZARIC) 28-10 MG CP24 Take 1 capsule by mouth every evening.    mirtazapine (REMERON) 30 MG tablet TAKE ONE TABLET AT BEDTIME Qty: 15 tablet, Refills: 0    Misc Natural Products (GLUCOSAMINE CHOND COMPLEX/MSM PO) Take 1 tablet by mouth 3 (three) times daily.     Multiple Vitamin (MULTIVITAMIN) tablet Take 1 tablet by mouth daily.    Omega-3 Fatty Acids (OMEGA 3 PO) Take 1 capsule by mouth 2 (two) times daily.     potassium chloride (K-DUR) 10 MEQ tablet Take 10 mEq by mouth daily.    pravastatin (PRAVACHOL) 20 MG tablet Take 20 mg by mouth daily.    RAPAFLO 8 MG CAPS capsule Take 8 mg by mouth daily.    saw palmetto 500 MG capsule Take 500 mg by mouth daily.    telmisartan (MICARDIS) 80 MG tablet Take 80 mg by mouth daily.    triamcinolone (NASACORT) 55 MCG/ACT AERO nasal inhaler Place 2 sprays into the nose at bedtime.      STOP taking these medications     aspirin 81 MG tablet      clopidogrel (PLAVIX) 75 MG tablet        No Known  Allergies Follow-up Information    Kevan Ny Ditty, MD Follow up in 4 week(s).   Specialty:  Neurosurgery Why:  Call on Monday 09/04/16 to make appointment and referral for CT scan Contact information: [address] [place] [address] [phone]        Ascencion Dike, MD. Schedule an appointment as soon as possible for a visit.   Specialty:  Otolaryngology Why:  In 7-10 days for suture removal Contact information: [address] [place] [address] [phone]        ARONSON,RICHARD A, MD. Schedule an appointment as soon as possible for a visit in 1 week(s).   Specialty:  Internal Medicine Why:  Hospital follow up Contact information: [address] [place] [address] [phone]            The results of significant diagnostics from this hospitalization (including imaging, microbiology, ancillary and laboratory) are listed below for reference.    Significant Diagnostic Studies: Ct  Head Wo Contrast  Result Date: 09/02/2016 CLINICAL DATA:  Subdural hemorrhage after fall EXAM: CT HEAD WITHOUT CONTRAST TECHNIQUE: Contiguous axial images were obtained from the base of the skull through the vertex without intravenous contrast. COMPARISON:  Head CT 09/01/2016 FINDINGS: Brain: Subdural and subarachnoid blood over the left convexity is unchanged, with the subdural component measuring 7 mm in greatest thickness. There is persistent blood layering along the left tentorial leaflet. There is now intraventricular extension of hemorrhage without hydrocephalus. No new mass effect or midline shift. Basal cisterns remain patent. There is no new area of hemorrhage. Vascular: No hyperdense vessel or unexpected calcification. Skull: Normal visualized skull base and calvarium. Decreased left scalp soft tissue swelling. Sinuses/Orbits: No sinus fluid levels or advanced mucosal thickening. No mastoid effusion. Normal orbits. IMPRESSION: 1. Unchanged appearance of  left convexity subdural and subarachnoid blood. 2. Development of intraventricular extension of hemorrhage with blood layering in the occipital horns of the lateral ventricles. No hydrocephalus. 3. No new area of hemorrhage, new mass effect or midline shift. Electronically Signed   By: Ulyses Jarred M.D.   On: 09/02/2016 05:57   Ct Head Wo Contrast  Result Date: 09/01/2016 CLINICAL DATA:  Fall, complex left ear laceration EXAM: CT HEAD WITHOUT CONTRAST CT CERVICAL SPINE WITHOUT CONTRAST TECHNIQUE: Multidetector CT imaging of the head and cervical spine was performed following the standard protocol without intravenous contrast. Multiplanar CT image reconstructions of the cervical spine were also generated. COMPARISON:  MRI brain dated 09/30/2014 FINDINGS: CT HEAD FINDINGS Brain: Subdural hematoma along the left frontal convexity measuring up to 8 mm in thickness (series 2/image 21). Underlying subarachnoid hemorrhage along the left frontoparietal region and extending into the sylvian fissure (series 2/image 16). Associated subdural/subarachnoid hemorrhage extending along the anterior left temporal lobe in the middle cranial fossa (series 2/image 10). Mild subdural hematoma along the left tentorium (series 2/image 16). No evidence of acute infarction, hydrocephalus, or mass lesion/mass effect. No intraventricular hemorrhage. Basal cisterns remain patent. Global cortical atrophy.  Secondary ventricular prominence. Mild subcortical white matter and periventricular small vessel ischemic changes. Intracranial atherosclerosis. Vascular: No hyperdense vessel or unexpected calcification. Skull: Normal. Negative for fracture or focal lesion. Sinuses/Orbits: The visualized paranasal sinuses are essentially clear. The mastoid air cells are unopacified. Other: Soft tissue laceration involving the left ear (series 2/ image 10). Overlying dressing. CT CERVICAL SPINE FINDINGS Alignment: Normal. Skull base and vertebrae: No  acute fracture. No primary bone lesion or focal pathologic process. Soft tissues and spinal canal: No prevertebral fluid or swelling. No visible canal hematoma. Disc levels: Mild degenerative changes of the mid cervical spine. Spinal canal remains patent. Upper chest: Visualized lung apices are clear. Other: Visualized thyroid is unremarkable. IMPRESSION: Left frontal subdural hematoma measuring up to 8 mm. Additional subdural/subarachnoid hemorrhage on the left, as described above. No midline shift. Basal cisterns remain patent. No intraventricular hemorrhage. No evidence of traumatic injury to the cervical spine. Critical Value/emergent results were called by telephone at the time of interpretation on 09/01/2016 at 12:03 pm to [facility], who verbally acknowledged these results. Electronically Signed   By: Julian Hy M.D.   On: 09/01/2016 12:04   Ct Cervical Spine Wo Contrast  Result Date: 09/01/2016 CLINICAL DATA:  Fall, complex left ear laceration EXAM: CT HEAD WITHOUT CONTRAST CT CERVICAL SPINE WITHOUT CONTRAST TECHNIQUE: Multidetector CT imaging of the head and cervical spine was performed following the standard protocol without intravenous contrast. Multiplanar CT image reconstructions of the cervical spine were also  generated. COMPARISON:  MRI brain dated 09/30/2014 FINDINGS: CT HEAD FINDINGS Brain: Subdural hematoma along the left frontal convexity measuring up to 8 mm in thickness (series 2/image 21). Underlying subarachnoid hemorrhage along the left frontoparietal region and extending into the sylvian fissure (series 2/image 16). Associated subdural/subarachnoid hemorrhage extending along the anterior left temporal lobe in the middle cranial fossa (series 2/image 10). Mild subdural hematoma along the left tentorium (series 2/image 16). No evidence of acute infarction, hydrocephalus, or mass lesion/mass effect. No intraventricular hemorrhage. Basal cisterns remain patent. Global cortical  atrophy.  Secondary ventricular prominence. Mild subcortical white matter and periventricular small vessel ischemic changes. Intracranial atherosclerosis. Vascular: No hyperdense vessel or unexpected calcification. Skull: Normal. Negative for fracture or focal lesion. Sinuses/Orbits: The visualized paranasal sinuses are essentially clear. The mastoid air cells are unopacified. Other: Soft tissue laceration involving the left ear (series 2/ image 10). Overlying dressing. CT CERVICAL SPINE FINDINGS Alignment: Normal. Skull base and vertebrae: No acute fracture. No primary bone lesion or focal pathologic process. Soft tissues and spinal canal: No prevertebral fluid or swelling. No visible canal hematoma. Disc levels: Mild degenerative changes of the mid cervical spine. Spinal canal remains patent. Upper chest: Visualized lung apices are clear. Other: Visualized thyroid is unremarkable. IMPRESSION: Left frontal subdural hematoma measuring up to 8 mm. Additional subdural/subarachnoid hemorrhage on the left, as described above. No midline shift. Basal cisterns remain patent. No intraventricular hemorrhage. No evidence of traumatic injury to the cervical spine. Critical Value/emergent results were called by telephone at the time of interpretation on 09/01/2016 at 12:03 pm to [facility], who verbally acknowledged these results. Electronically Signed   By: Julian Hy M.D.   On: 09/01/2016 12:04    Microbiology: Recent Results (from the past 240 hour(s))  MRSA PCR Screening     Status: None   Collection Time: 09/01/16  8:58 PM  Result Value Ref Range Status   MRSA by PCR NEGATIVE NEGATIVE Final    Comment:        The GeneXpert MRSA Assay (FDA approved for NASAL specimens only), is one component of a comprehensive MRSA colonization surveillance program. It is not intended to diagnose MRSA infection nor to guide or monitor treatment for MRSA infections.      Labs: Basic Metabolic Panel:  Recent  Labs Lab 09/01/16 1201 09/03/16 0235  NA 138 138  K 3.1* 2.8*  CL 104 103  CO2 24 26  GLUCOSE 210* 195*  BUN 21* 19  CREATININE 0.94 1.01  CALCIUM 9.7 9.2  MG  --  1.9   Liver Function Tests: No results for input(s): AST, ALT, ALKPHOS, BILITOT, PROT, ALBUMIN in the last 168 hours. No results for input(s): LIPASE, AMYLASE in the last 168 hours. No results for input(s): AMMONIA in the last 168 hours. CBC:  Recent Labs Lab 09/01/16 1201 09/01/16 2058 09/02/16 0414 09/03/16 0235  WBC 17.4* 9.7 10.1 12.7*  NEUTROABS 15.2*  --   --   --   HGB 12.0* 9.9* 9.9* 10.7*  HCT 35.8* 29.5* 30.1* 31.8*  MCV 92.0 91.3 92.0 91.9  PLT 220 247 253 216   Cardiac Enzymes: No results for input(s): CKTOTAL, CKMB, CKMBINDEX, TROPONINI in the last 168 hours. BNP: BNP (last 3 results) No results for input(s): BNP in the last 8760 hours.  ProBNP (last 3 results) No results for input(s): PROBNP in the last 8760 hours.  CBG: No results for input(s): GLUCAP in the last 168 hours.     Signed:  Cristal Ford  [facility] 09/03/2016, 1:13 PM

## 2016-09-03 NOTE — NC FL2 (Addendum)
[place] LEVEL OF CARE SCREENING TOOL     IDENTIFICATION  Patient Name: Kenneth Stark Birthdate: 1925-03-10 Sex: male Admission Date (Current Location): 09/01/2016  [facility] and [place] Number:  Herbalist and Address:  [facility] [place], [place] [address] [place], [place] [address]      Provider Number: [id number]  Attending Physician Name and Address:  Cristal Ford, DO  Relative Name and Phone Number:       Current Level of Care: Hospital Recommended Level of Care: [place] Prior Approval Number:    Date Approved/Denied:   PASRR Number:  [id number]  Discharge Plan: SNF    Current Diagnoses: Patient Active Problem List   Diagnosis Date Noted  . Subdural hematoma ([place]) 09/01/2016  . Complex laceration of left ear 09/01/2016  . Cerebrovascular disease 09/01/2016  . History of TIA (transient ischemic attack) 09/01/2016  . Essential hypertension   . Sixth nerve palsy   . Cervical spondylosis   . Degenerative joint disease (DJD) of lumbar spine   . Arthritis   . Dyslipidemia   . Essential tremor   . Abnormality of gait 11/27/2013    Orientation RESPIRATION BLADDER Height & Weight     Self, Situation, Place  Normal Continent Weight: 175 lb 0.7 oz (79.4 kg) Height:  5\' 8"  (172.7 cm)  BEHAVIORAL SYMPTOMS/MOOD NEUROLOGICAL BOWEL NUTRITION STATUS   (NONE )  (NONE ) Continent Diet (Heart Healthy )  AMBULATORY STATUS COMMUNICATION OF NEEDS Skin   Limited Assist Verbally Skin abrasions (L. Ear )                       Personal Care Assistance Level of Assistance  Bathing, Feeding, Dressing Bathing Assistance: Limited assistance Feeding assistance: Limited assistance Dressing Assistance: Limited assistance     Functional Limitations Info  Speech, Hearing, Sight Sight Info: Adequate Hearing Info: Adequate Speech Info: Adequate    SPECIAL CARE FACTORS FREQUENCY  PT (By licensed PT)     PT  Frequency: 3              Contractures      Additional Factors Info  Code Status, Allergies Code Status Info: FULL CODE  Allergies Info: N/A           Current Medications (09/03/2016):  This is the current hospital active medication list Current Facility-Administered Medications  Medication Dose Route Frequency Provider Last Rate Last Dose  . 0.9 %  sodium chloride infusion   Intravenous Once Virgel Manifold, MD      . acetaminophen (TYLENOL) tablet 650 mg  650 mg Oral Q6H PRN Tanna Savoy Stinson, DO   650 mg at 09/02/16 1458   Or  . acetaminophen (TYLENOL) suppository 650 mg  650 mg Rectal Q6H PRN Tanna Savoy Stinson, DO      . amLODipine (NORVASC) tablet 10 mg  10 mg Oral Daily Tanna Savoy Stinson, DO   10 mg at 09/03/16 0947  . donepezil (ARICEPT) tablet 10 mg  10 mg Oral QHS Tanna Savoy Stinson, DO   10 mg at 09/02/16 2220  . escitalopram (LEXAPRO) tablet 10 mg  10 mg Oral Daily Tanna Savoy Stinson, DO   10 mg at 09/03/16 0946  . hydrochlorothiazide (HYDRODIURIL) tablet 25 mg  25 mg Oral Daily Tanna Savoy Stinson, DO   25 mg at 09/03/16 0946  . ibuprofen (ADVIL,MOTRIN) tablet 600 mg  600 mg Oral Q6H PRN Truett Mainland, DO  600 mg at 09/03/16 0946  . irbesartan (AVAPRO) tablet 300 mg  300 mg Oral Daily Tanna Savoy Stinson, DO   300 mg at 09/03/16 0947  . memantine (NAMENDA XR) 24 hr capsule 28 mg  28 mg Oral QHS Tanna Savoy Stinson, DO   28 mg at 09/02/16 2220  . mirtazapine (REMERON) tablet 30 mg  30 mg Oral QHS Tanna Savoy Stinson, DO   30 mg at 09/02/16 2220  . omega-3 acid ethyl esters (LOVAZA) capsule 1 g  1 g Oral BID Tanna Savoy Stinson, DO   1 g at 09/03/16 [id number]  . ondansetron (ZOFRAN) tablet 4 mg  4 mg Oral Q6H PRN Tanna Savoy Stinson, DO       Or  . ondansetron [facility]) injection 4 mg  4 mg Intravenous Q6H PRN Tanna Savoy Stinson, DO      . polyethylene glycol (MIRALAX / GLYCOLAX) packet 17 g  17 g Oral Daily PRN Tanna Savoy Stinson, DO   17 g at 09/03/16 0948  . polyvinyl alcohol (LIQUIFILM TEARS) 1.4 % ophthalmic  solution 1 drop  1 drop Both Eyes PRN Tanna Savoy Stinson, DO      . potassium chloride (K-DUR) CR tablet 10 mEq  10 mEq Oral Daily Tanna Savoy Stinson, DO   10 mEq at 09/03/16 0947  . potassium chloride SA (K-DUR,KLOR-CON) CR tablet 40 mEq  40 mEq Oral BID Maryann Mikhail, DO   40 mEq at 09/03/16 0947  . pravastatin (PRAVACHOL) tablet 20 mg  20 mg Oral QHS Tanna Savoy Stinson, DO   20 mg at 09/02/16 2220  . tamsulosin (FLOMAX) capsule 0.4 mg  0.4 mg Oral QPC supper Tanna Savoy Stinson, DO   0.4 mg at 09/02/16 1753  . triamcinolone (NASACORT) nasal inhaler 2 spray  2 spray Nasal QHS Truett Mainland, DO   2 spray at 09/02/16 2220     Discharge Medications: Please see discharge summary for a list of discharge medications.  Relevant Imaging Results:  Relevant Lab Results:   Additional Information SSN [id number]  Glendon Axe A

## 2016-09-03 NOTE — Clinical Social Work Placement (Signed)
Patient admitted from SNF, [facility] and will return to SNF for STR. Patient and family agreeable to SNF placement.   Medical Social Worker facilitated patient discharge including contacting patient family and facility to confirm patient discharge plans.  Clinical information faxed to facility and family agreeable with plan.  MSW arranged ambulance transport via [facility] to [facility].  RN to call report prior to discharge.  Medical Social Worker will sign off for now as social work intervention is no longer needed. Please consult [place] again if new need arises.  CLINICAL SOCIAL WORK PLACEMENT  NOTE  Date:  09/03/2016  Patient Details  Name: Kenneth Stark MRN: [id number] Date of Birth: 1925-01-04  Clinical Social Work is seeking post-discharge placement for this patient at the [place] level of care (*CSW will initial, date and re-position this form in  chart as items are completed):  Yes   Patient/family provided with [place] Work Department's list of facilities offering this level of care within the geographic area requested by the patient (or if unable, by the patient's family).  Yes   Patient/family informed of their freedom to choose among providers that offer the needed level of care, that participate in Medicare, Medicaid or managed care program needed by the patient, have an available bed and are willing to accept the patient.  Yes   Patient/family informed of [place]'s ownership interest in [facility] and [facility], as well as of the fact that they are under no obligation to receive care at these facilities.  PASRR submitted to EDS on 09/03/16     PASRR number received on 09/03/16     Existing PASRR number confirmed on       FL2 transmitted to all facilities in geographic area requested by pt/family on 09/03/16     FL2 transmitted to all facilities within larger geographic area on       Patient informed that his/her  managed care company has contracts with or will negotiate with certain facilities, including the following:        Yes   Patient/family informed of bed offers received.  Patient chooses bed at  ([facility] )     Physician recommends and patient chooses bed at      Patient to be transferred to  ([facility] ) on 09/03/16.  Patient to be transferred to facility by  Corey Harold )     Patient family notified on 09/03/16 of transfer.  Name of family member notified:   (Pt's dtr, Mickel Baas and spouse, [name] )     PHYSICIAN Please prepare priority discharge summary, including medications, Please sign FL2     Additional Comment:    _______________________________________________ Glendon Axe A 09/03/2016, 3:04 PM

## 2016-09-04 LAB — BASIC METABOLIC PANEL
BUN: 22 mg/dL — AB (ref 4–21)
Creatinine: 0.9 mg/dL (ref 0.6–1.3)
Glucose: 181 mg/dL
Potassium: 3.6 mmol/L (ref 3.4–5.3)
Sodium: 141 mmol/L (ref 137–147)

## 2016-09-04 LAB — CBC AND DIFFERENTIAL
HCT: 29 % — AB (ref 41–53)
Hemoglobin: 10.1 g/dL — AB (ref 13.5–17.5)
Platelets: 231 10*3/uL (ref 150–399)
WBC: 9.9 10^3/mL

## 2016-09-05 ENCOUNTER — Non-Acute Institutional Stay (SKILLED_NURSING_FACILITY): Payer: Medicare Other | Admitting: Internal Medicine

## 2016-09-05 ENCOUNTER — Encounter: Payer: Self-pay | Admitting: Internal Medicine

## 2016-09-05 DIAGNOSIS — I10 Essential (primary) hypertension: Secondary | ICD-10-CM

## 2016-09-05 DIAGNOSIS — E876 Hypokalemia: Secondary | ICD-10-CM | POA: Diagnosis not present

## 2016-09-05 DIAGNOSIS — R410 Disorientation, unspecified: Secondary | ICD-10-CM

## 2016-09-05 DIAGNOSIS — S01312A Laceration without foreign body of left ear, initial encounter: Secondary | ICD-10-CM

## 2016-09-05 DIAGNOSIS — F0150 Vascular dementia without behavioral disturbance: Secondary | ICD-10-CM

## 2016-09-05 DIAGNOSIS — I6200 Nontraumatic subdural hemorrhage, unspecified: Secondary | ICD-10-CM | POA: Diagnosis not present

## 2016-09-05 DIAGNOSIS — F05 Delirium due to known physiological condition: Secondary | ICD-10-CM | POA: Diagnosis not present

## 2016-09-05 DIAGNOSIS — I679 Cerebrovascular disease, unspecified: Secondary | ICD-10-CM | POA: Diagnosis not present

## 2016-09-05 DIAGNOSIS — S065X9A Traumatic subdural hemorrhage with loss of consciousness of unspecified duration, initial encounter: Secondary | ICD-10-CM

## 2016-09-05 DIAGNOSIS — G250 Essential tremor: Secondary | ICD-10-CM

## 2016-09-05 DIAGNOSIS — S065XAA Traumatic subdural hemorrhage with loss of consciousness status unknown, initial encounter: Secondary | ICD-10-CM

## 2016-09-05 NOTE — Progress Notes (Signed)
Patient ID: Kenneth Stark, male   DOB: 1925-05-15, 82 y.o.   MRN: [id number]  Provider:  Rexene Edison. Mariea Clonts, D.O., C.M.D. Location:  [place] Room Number: [place] of Service:  SNF (31)  PCP: Geoffery Lyons, MD Patient Care Team: Burnard Bunting, MD as PCP - General (Internal Medicine)  Extended Emergency Contact Information Primary Emergency Contact: Grunert,[name] Address: [id number]          [place] [address] [place] Phone: [phone] Relation: Spouse Secondary Emergency Contact: [name] [place] Mobile Phone: [phone] Relation: Daughter  Code Status: full code Goals of Care: Advanced Directive information Advanced Directives 09/05/2016  Does Patient Have a Medical Advance Directive? Yes  Type of Paramedic of [place];Living will  Does patient want to make changes to medical advance directive? No - Patient declined  Copy of [place] in Chart? Yes  Would patient like information on creating a medical advance directive? No - Patient declined   Chief Complaint  Patient presents with  . New Admit To SNF    Rehab Admission    HPI: Patient is a 82 y.o. male with h/o dementia (likely vascular with known cerebrovascular disease) on aricept and namenda (his wife reported he was still going to work and actively participating), htn, prior TIA, DJD, essential tremor, HL seen today for admission to [facility] rehab s/p hospitalization for fall 11/17-11/19/17.  He was walking in from the car at home in the [place] area, fell striking his left side of his head and lacerating his left ear.  His wife found him and called 911.  He was transported to the ED where he underwent CT head that showed an 65mm left subdural hematoma which remained stable thru 11/18 w/o midline shift.  Dr. Benjamine Mola from ENT sutured his left ear laceration and he is to f/u with him in  7-10 days for suture removal per d/c summary.    He was transfused 2 units of platelets.  He became anemic with hgb dropping from 12 to 10.7 which requires monitoring.  Dr.  Cyndy Freeze recommended holding plavix for an additional week after discharge (was on this due to prior TIA), then f/u in 4 wks for repeat CT head.  PT and OT were consulted and recommended SNF for rehab.  He had hypokalemia and bmp and mag were being monitored.    Staff reported extreme confusion here.  He was trying to get out of the bed when not able to safely ambulate independently.  His coordination was poor and he had difficulty feeding himself--movements were very slow during my visit.  His wife was in the room and asking him to demonstrate his ability to pick up the shrimp with a fork which appeared to be very difficult for him.  He spoke minimally to me. He answered some yes and no questions, but not always appropriately.  Affect was flat and he was drowsy and fell asleep multiple times while trying to eat his small lunch. This was reported to be the same has he'd been since arrival 11/19 here.  He was able to follow some commands, but instructions had to be repeated several times.    Past Medical History:  Diagnosis Date  . Abnormality of gait   . Arthritis   . Cervical spondylosis   . Degenerative joint disease (DJD) of lumbar spine   . Diplopia   . Dyslipidemia   . Essential tremor   .  Hearing difficulty    hearing aids  . Hypertension   . 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 Small vessel disease    Past Surgical History:  Procedure Laterality Date  . KNEE ARTHROSCOPY Left    Dr. Hart Robinsons 2002  . knee injections Right    Dr. Adriana Mccallum    reports that he has never smoked. He has never used smokeless tobacco. He reports that he drinks alcohol. He reports that he does not use drugs. Social History   Social History  . Marital status: Married    Spouse name: N/A  . Number of  children: 4  . Years of education: N/A   Occupational History  . Retired     [employer]   Social History Main Topics  . Smoking status: Never Smoker  . Smokeless tobacco: Never Used  . Alcohol use Yes  . Drug use: No  . Sexual activity: Not on file   Other Topics Concern  . Not on file   Social History Narrative  . No narrative on file    Functional Status Survey:  presently dependent in all adls--needing help even to feed himself and cuing When his wife reports he was going to work some days per week (but on aricept and namenda when doing this?)  Family History  Problem Relation Age of Onset  . Stroke Mother   . Stroke Father   . Heart disease Father   . Dementia Brother   . Renal Disease Brother   . Renal Disease Daughter     Health Maintenance  Topic Date Due  . ZOSTAVAX  10/06/1987  . PNA vac Low Risk Adult (1 of 2 - PCV13) 10/05/1992  . INFLUENZA VACCINE  05/16/2016  . TETANUS/TDAP  09/01/2026    No Known Allergies    Medication List       Accurate as of 09/05/16 10:00 AM. Always use your most recent med list.          acetaminophen 325 MG tablet Commonly known as:  TYLENOL Take 2 tablets (650 mg total) by mouth every 6 (six) hours as needed for mild pain (or Fever >/= 101).   amLODipine 10 MG tablet Commonly known as:  NORVASC Take 10 mg by mouth daily.   aspirin 81 MG tablet RESTART IN ONE WEEK FROM 09/04/16   CALTRATE 600+D PO Take 600 mg by mouth 3 (three) times daily.   clopidogrel 75 MG tablet Commonly known as:  PLAVIX RESTART IN ONE WEEK FROM 09/04/16   Co-Enzyme Q-10 100 MG Caps Take 100 mg by mouth daily.   escitalopram 10 MG tablet Commonly known as:  LEXAPRO Take 10 mg by mouth daily.   EYE LUBRICANT OP Place 1 drop into both eyes daily.   GLUCOSAMINE CHOND COMPLEX/MSM PO Take 1 tablet by mouth 3 (three) times daily.   hydrochlorothiazide 25 MG tablet Commonly known as:  HYDRODIURIL Take 25 mg by mouth  daily.   Lutein-Zeaxanthin 25-5 MG Caps Take 1 capsule by mouth daily.   mirtazapine 30 MG tablet Commonly known as:  REMERON Take 30 mg by mouth at bedtime.   multivitamin tablet Take 1 tablet by mouth daily.   NAMZARIC 28-10 MG Cp24 Generic drug:  Memantine HCl-Donepezil HCl Take 1 capsule by mouth every evening.   OMEGA 3 PO Take 1 capsule by mouth 2 (two) times daily.   polyethylene glycol packet Commonly known as:  MIRALAX / GLYCOLAX Take 17 g by mouth daily. With 8oz of water   potassium chloride 10 MEQ tablet Commonly known as:  K-DUR Take 10 mEq by mouth daily.   pravastatin 20 MG tablet Commonly known as:  PRAVACHOL Take 20 mg by mouth daily.   RAPAFLO 8 MG Caps capsule Generic drug:  silodosin Take 8 mg by mouth daily.   saw palmetto 500 MG capsule Take 500 mg by mouth daily.   telmisartan 80 MG tablet Commonly known as:  MICARDIS Take 80 mg by mouth daily.   triamcinolone 55 MCG/ACT Aero nasal inhaler Commonly known as:  NASACORT Place 2 sprays into the nose at bedtime.   vitamin C 1000 MG tablet Take 1,000 mg by mouth 3 (three) times daily.       Review of Systems  Unable to perform ROS: Mental status change (also minimally verbal at present, lethargic, and has baseline dementia)  see hpi  Vitals:   09/05/16 0945  BP: 139/61  Pulse: 75  Resp: 19  Temp: 99.9 F (37.7 C)  TempSrc: Oral  SpO2: 92%  Weight: 175 lb (79.4 kg)   Body mass index is 26.61 kg/m. Physical Exam  Constitutional: He appears well-developed and well-nourished. No distress.  Sitting up in wheelchair nodding off with food in front of him  HENT:  Right Ear: External ear normal.  Mouth/Throat: Oropharynx is clear and moist. No oropharyngeal exudate.  Left ear remains ecchymotic, swollen and tender, sutures in place  Eyes: Conjunctivae and EOM are normal. Pupils are equal, round, and reactive to light.  Neck: Neck supple. No JVD present.  Cardiovascular: Normal  rate, regular rhythm and intact distal pulses.   Murmur heard. Pulmonary/Chest: Effort normal and breath sounds normal. No respiratory distress.  Abdominal: Soft. Bowel sounds are normal. He exhibits no distension and no mass. There is no tenderness. There is no rebound and no guarding.  Musculoskeletal:  Was able to move all 4 extremities equally; action tremor noted  Lymphadenopathy:    He has no cervical adenopathy.  Neurological: Coordination abnormal.  Falling asleep repeatedly during visit (see hpi); seems to have some dysarthria and perhaps an aphasia (speech so minimal it's hard to tell today)  Skin: Skin is warm.  Psychiatric:  Flat affect    Labs reviewed: Basic Metabolic Panel:  Recent Labs  09/01/16 1201 09/03/16 0235 09/03/16 1230 09/04/16 0124  NA 138 138 138 141  K 3.1* 2.8* 3.3* 3.6  CL 104 103 103  --   CO2 24 26 27   --   GLUCOSE 210* 195* 234*  --   BUN 21* 19 19 22*  CREATININE 0.94 1.01 0.89 0.9  CALCIUM 9.7 9.2 8.9  --   MG  --  1.9  --   --    Liver Function Tests: No results for input(s): AST, ALT, ALKPHOS, BILITOT, PROT, ALBUMIN in the last 8760 hours. No results for input(s): LIPASE, AMYLASE in the last 8760 hours. No results for input(s): AMMONIA in the last 8760 hours. CBC:  Recent Labs  09/01/16 1201 09/01/16 2058 09/02/16 0414 09/03/16 0235 09/04/16 0124  WBC 17.4* 9.7 10.1 12.7* 9.9  NEUTROABS 15.2*  --   --   --   --   HGB 12.0* 9.9* 9.9* 10.7* 10.1*  HCT 35.8* 29.5* 30.1* 31.8* 29*  MCV 92.0 91.3 92.0 91.9  --   PLT 220 247 253 216 231   Cardiac Enzymes: No results for input(s): CKTOTAL, CKMB, CKMBINDEX, TROPONINI in  the last 8760 hours. BNP: Invalid input(s): POCBNP No results found for: HGBA1C No results found for: TSH No results found for: VITAMINB12 No results found for: FOLATE No results found for: IRON, TIBC, FERRITIN  Imaging and Procedures obtained prior to SNF admission: Ct Head Wo Contrast  Result Date:  09/02/2016 CLINICAL DATA:  Subdural hemorrhage after fall EXAM: CT HEAD WITHOUT CONTRAST TECHNIQUE: Contiguous axial images were obtained from the base of the skull through the vertex without intravenous contrast. COMPARISON:  Head CT 09/01/2016 FINDINGS: Brain: Subdural and subarachnoid blood over the left convexity is unchanged, with the subdural component measuring 7 mm in greatest thickness. There is persistent blood layering along the left tentorial leaflet. There is now intraventricular extension of hemorrhage without hydrocephalus. No new mass effect or midline shift. Basal cisterns remain patent. There is no new area of hemorrhage. Vascular: No hyperdense vessel or unexpected calcification. Skull: Normal visualized skull base and calvarium. Decreased left scalp soft tissue swelling. Sinuses/Orbits: No sinus fluid levels or advanced mucosal thickening. No mastoid effusion. Normal orbits. IMPRESSION: 1. Unchanged appearance of left convexity subdural and subarachnoid blood. 2. Development of intraventricular extension of hemorrhage with blood layering in the occipital horns of the lateral ventricles. No hydrocephalus. 3. No new area of hemorrhage, new mass effect or midline shift. Electronically Signed   By: Ulyses Jarred M.D.   On: 09/02/2016 05:57   Ct Head Wo Contrast  Result Date: 09/01/2016 CLINICAL DATA:  Fall, complex left ear laceration EXAM: CT HEAD WITHOUT CONTRAST CT CERVICAL SPINE WITHOUT CONTRAST TECHNIQUE: Multidetector CT imaging of the head and cervical spine was performed following the standard protocol without intravenous contrast. Multiplanar CT image reconstructions of the cervical spine were also generated. COMPARISON:  MRI brain dated 09/30/2014 FINDINGS: CT HEAD FINDINGS Brain: Subdural hematoma along the left frontal convexity measuring up to 8 mm in thickness (series 2/image 21). Underlying subarachnoid hemorrhage along the left frontoparietal region and extending into the  sylvian fissure (series 2/image 16). Associated subdural/subarachnoid hemorrhage extending along the anterior left temporal lobe in the middle cranial fossa (series 2/image 10). Mild subdural hematoma along the left tentorium (series 2/image 16). No evidence of acute infarction, hydrocephalus, or mass lesion/mass effect. No intraventricular hemorrhage. Basal cisterns remain patent. Global cortical atrophy.  Secondary ventricular prominence. Mild subcortical white matter and periventricular small vessel ischemic changes. Intracranial atherosclerosis. Vascular: No hyperdense vessel or unexpected calcification. Skull: Normal. Negative for fracture or focal lesion. Sinuses/Orbits: The visualized paranasal sinuses are essentially clear. The mastoid air cells are unopacified. Other: Soft tissue laceration involving the left ear (series 2/ image 10). Overlying dressing. CT CERVICAL SPINE FINDINGS Alignment: Normal. Skull base and vertebrae: No acute fracture. No primary bone lesion or focal pathologic process. Soft tissues and spinal canal: No prevertebral fluid or swelling. No visible canal hematoma. Disc levels: Mild degenerative changes of the mid cervical spine. Spinal canal remains patent. Upper chest: Visualized lung apices are clear. Other: Visualized thyroid is unremarkable. IMPRESSION: Left frontal subdural hematoma measuring up to 8 mm. Additional subdural/subarachnoid hemorrhage on the left, as described above. No midline shift. Basal cisterns remain patent. No intraventricular hemorrhage. No evidence of traumatic injury to the cervical spine. Critical Value/emergent results were called by telephone at the time of interpretation on 09/01/2016 at 12:03 pm to [facility], who verbally acknowledged these results. Electronically Signed   By: Julian Hy M.D.   On: 09/01/2016 12:04   Ct Cervical Spine Wo Contrast  Result Date: 09/01/2016 CLINICAL DATA:  Fall, complex left ear laceration EXAM: CT HEAD  WITHOUT CONTRAST CT CERVICAL SPINE WITHOUT CONTRAST TECHNIQUE: Multidetector CT imaging of the head and cervical spine was performed following the standard protocol without intravenous contrast. Multiplanar CT image reconstructions of the cervical spine were also generated. COMPARISON:  MRI brain dated 09/30/2014 FINDINGS: CT HEAD FINDINGS Brain: Subdural hematoma along the left frontal convexity measuring up to 8 mm in thickness (series 2/image 21). Underlying subarachnoid hemorrhage along the left frontoparietal region and extending into the sylvian fissure (series 2/image 16). Associated subdural/subarachnoid hemorrhage extending along the anterior left temporal lobe in the middle cranial fossa (series 2/image 10). Mild subdural hematoma along the left tentorium (series 2/image 16). No evidence of acute infarction, hydrocephalus, or mass lesion/mass effect. No intraventricular hemorrhage. Basal cisterns remain patent. Global cortical atrophy.  Secondary ventricular prominence. Mild subcortical white matter and periventricular small vessel ischemic changes. Intracranial atherosclerosis. Vascular: No hyperdense vessel or unexpected calcification. Skull: Normal. Negative for fracture or focal lesion. Sinuses/Orbits: The visualized paranasal sinuses are essentially clear. The mastoid air cells are unopacified. Other: Soft tissue laceration involving the left ear (series 2/ image 10). Overlying dressing. CT CERVICAL SPINE FINDINGS Alignment: Normal. Skull base and vertebrae: No acute fracture. No primary bone lesion or focal pathologic process. Soft tissues and spinal canal: No prevertebral fluid or swelling. No visible canal hematoma. Disc levels: Mild degenerative changes of the mid cervical spine. Spinal canal remains patent. Upper chest: Visualized lung apices are clear. Other: Visualized thyroid is unremarkable. IMPRESSION: Left frontal subdural hematoma measuring up to 8 mm. Additional subdural/subarachnoid  hemorrhage on the left, as described above. No midline shift. Basal cisterns remain patent. No intraventricular hemorrhage. No evidence of traumatic injury to the cervical spine. Critical Value/emergent results were called by telephone at the time of interpretation on 09/01/2016 at 12:03 pm to [facility], who verbally acknowledged these results. Electronically Signed   By: Julian Hy M.D.   On: 09/01/2016 12:04    Assessment/Plan 1. Subdural hematoma (HCC) -seems this has significantly diminished his functional status -his wife reports he was going to work some before this (not clear how active he was at work with two dementia meds needed) -currently would not be able to do MMSE in this state and can hardly feed himself -cont to monitor po intake to ensure he does not need fluids  2. Cerebrovascular disease -with prior TIA and apparently some strokes causing dementia -cont secondary prevention strategies  3. Vascular dementia without behavioral disturbance -due to cerebrovascular disease -note he must have already had moderate dementia considering he was on both aricept and namenda prior to the fall and SDH  4. Essential hypertension -bp satisfactory today, cont to monitor and keep meds same as above  5. Complex laceration of left ear, initial encounter -s/p repair by ENT--d/c says to f/u with them for suture removal (Dr. Benjamine Mola)  6. Essential tremor -listed in admission diagnoses and noted on exam  7. Subacute delirium -by report, this has been pt's state since arrival from the hospital so suspect due to multiple factors including recent subdural, baseline vascular dementia with prior mini strokes, environmental changes, electrolyte abnormalities, limited po intake due to confusion/lethargy, med changes, etc. -monitor - anemia stable at 10.1/29.2, creatinine wnl at 0.86 and lytes normalized, has hyperglycemia with glucose 181, but not clear if fasting at that time  8.  Hypokalemia -resolved, K now 3.6  9. Hypomagnesemia -was repleted and now normal  Family/ staff Communication: discussed with wife  and nursing staff who reported this has been his condition since arriving from the hospital  Labs/tests ordered:  F/u cbc, bmp, mg already done

## 2016-09-07 ENCOUNTER — Emergency Department (HOSPITAL_COMMUNITY): Payer: Medicare Other

## 2016-09-07 ENCOUNTER — Inpatient Hospital Stay (HOSPITAL_COMMUNITY)
Admission: EM | Admit: 2016-09-07 | Discharge: 2016-09-15 | DRG: 085 | Disposition: A | Discharge status: Skilled Nursing Facility | Payer: Medicare Other | Attending: Internal Medicine | Admitting: Internal Medicine

## 2016-09-07 ENCOUNTER — Encounter (HOSPITAL_COMMUNITY): Payer: Self-pay | Admitting: *Deleted

## 2016-09-07 DIAGNOSIS — Z823 Family history of stroke: Secondary | ICD-10-CM | POA: Diagnosis not present

## 2016-09-07 DIAGNOSIS — J9601 Acute respiratory failure with hypoxia: Secondary | ICD-10-CM | POA: Diagnosis present

## 2016-09-07 DIAGNOSIS — Z8249 Family history of ischemic heart disease and other diseases of the circulatory system: Secondary | ICD-10-CM

## 2016-09-07 DIAGNOSIS — E876 Hypokalemia: Secondary | ICD-10-CM | POA: Diagnosis present

## 2016-09-07 DIAGNOSIS — M47812 Spondylosis without myelopathy or radiculopathy, cervical region: Secondary | ICD-10-CM | POA: Diagnosis present

## 2016-09-07 DIAGNOSIS — G250 Essential tremor: Secondary | ICD-10-CM | POA: Diagnosis present

## 2016-09-07 DIAGNOSIS — E1165 Type 2 diabetes mellitus with hyperglycemia: Secondary | ICD-10-CM | POA: Diagnosis present

## 2016-09-07 DIAGNOSIS — G4700 Insomnia, unspecified: Secondary | ICD-10-CM | POA: Diagnosis present

## 2016-09-07 DIAGNOSIS — Z8673 Personal history of transient ischemic attack (TIA), and cerebral infarction without residual deficits: Secondary | ICD-10-CM | POA: Diagnosis not present

## 2016-09-07 DIAGNOSIS — R4182 Altered mental status, unspecified: Secondary | ICD-10-CM | POA: Diagnosis present

## 2016-09-07 DIAGNOSIS — S065X9A Traumatic subdural hemorrhage with loss of consciousness of unspecified duration, initial encounter: Secondary | ICD-10-CM

## 2016-09-07 DIAGNOSIS — I10 Essential (primary) hypertension: Secondary | ICD-10-CM | POA: Diagnosis present

## 2016-09-07 DIAGNOSIS — Z79899 Other long term (current) drug therapy: Secondary | ICD-10-CM

## 2016-09-07 DIAGNOSIS — Z7902 Long term (current) use of antithrombotics/antiplatelets: Secondary | ICD-10-CM

## 2016-09-07 DIAGNOSIS — R740 Nonspecific elevation of levels of transaminase and lactic acid dehydrogenase [LDH]: Secondary | ICD-10-CM

## 2016-09-07 DIAGNOSIS — W1830XA Fall on same level, unspecified, initial encounter: Secondary | ICD-10-CM | POA: Diagnosis present

## 2016-09-07 DIAGNOSIS — M1990 Unspecified osteoarthritis, unspecified site: Secondary | ICD-10-CM | POA: Diagnosis present

## 2016-09-07 DIAGNOSIS — E1151 Type 2 diabetes mellitus with diabetic peripheral angiopathy without gangrene: Secondary | ICD-10-CM | POA: Diagnosis present

## 2016-09-07 DIAGNOSIS — I609 Nontraumatic subarachnoid hemorrhage, unspecified: Secondary | ICD-10-CM | POA: Diagnosis not present

## 2016-09-07 DIAGNOSIS — S065XAA Traumatic subdural hemorrhage with loss of consciousness status unknown, initial encounter: Secondary | ICD-10-CM

## 2016-09-07 DIAGNOSIS — R739 Hyperglycemia, unspecified: Secondary | ICD-10-CM | POA: Diagnosis present

## 2016-09-07 DIAGNOSIS — R0902 Hypoxemia: Secondary | ICD-10-CM

## 2016-09-07 DIAGNOSIS — I6200 Nontraumatic subdural hemorrhage, unspecified: Secondary | ICD-10-CM | POA: Diagnosis not present

## 2016-09-07 DIAGNOSIS — R410 Disorientation, unspecified: Secondary | ICD-10-CM | POA: Diagnosis not present

## 2016-09-07 DIAGNOSIS — S066X0A Traumatic subarachnoid hemorrhage without loss of consciousness, initial encounter: Secondary | ICD-10-CM | POA: Diagnosis present

## 2016-09-07 DIAGNOSIS — N400 Enlarged prostate without lower urinary tract symptoms: Secondary | ICD-10-CM | POA: Diagnosis present

## 2016-09-07 DIAGNOSIS — G9341 Metabolic encephalopathy: Secondary | ICD-10-CM | POA: Diagnosis present

## 2016-09-07 DIAGNOSIS — R7401 Elevation of levels of liver transaminase levels: Secondary | ICD-10-CM | POA: Diagnosis present

## 2016-09-07 DIAGNOSIS — F329 Major depressive disorder, single episode, unspecified: Secondary | ICD-10-CM | POA: Diagnosis present

## 2016-09-07 DIAGNOSIS — E785 Hyperlipidemia, unspecified: Secondary | ICD-10-CM | POA: Diagnosis present

## 2016-09-07 DIAGNOSIS — M5136 Other intervertebral disc degeneration, lumbar region: Secondary | ICD-10-CM | POA: Diagnosis present

## 2016-09-07 DIAGNOSIS — D72829 Elevated white blood cell count, unspecified: Secondary | ICD-10-CM | POA: Diagnosis present

## 2016-09-07 DIAGNOSIS — F0390 Unspecified dementia without behavioral disturbance: Secondary | ICD-10-CM | POA: Diagnosis present

## 2016-09-07 DIAGNOSIS — D649 Anemia, unspecified: Secondary | ICD-10-CM | POA: Diagnosis present

## 2016-09-07 DIAGNOSIS — H4920 Sixth [abducent] nerve palsy, unspecified eye: Secondary | ICD-10-CM | POA: Diagnosis present

## 2016-09-07 DIAGNOSIS — R443 Hallucinations, unspecified: Secondary | ICD-10-CM | POA: Diagnosis not present

## 2016-09-07 DIAGNOSIS — R471 Dysarthria and anarthria: Secondary | ICD-10-CM | POA: Diagnosis present

## 2016-09-07 DIAGNOSIS — J9811 Atelectasis: Secondary | ICD-10-CM | POA: Diagnosis present

## 2016-09-07 DIAGNOSIS — Z7982 Long term (current) use of aspirin: Secondary | ICD-10-CM | POA: Diagnosis not present

## 2016-09-07 DIAGNOSIS — E860 Dehydration: Secondary | ICD-10-CM | POA: Diagnosis present

## 2016-09-07 DIAGNOSIS — Z841 Family history of disorders of kidney and ureter: Secondary | ICD-10-CM

## 2016-09-07 DIAGNOSIS — G9340 Encephalopathy, unspecified: Secondary | ICD-10-CM | POA: Diagnosis not present

## 2016-09-07 DIAGNOSIS — Z820 Family history of epilepsy and other diseases of the nervous system: Secondary | ICD-10-CM

## 2016-09-07 LAB — COMPREHENSIVE METABOLIC PANEL
ALBUMIN: 2.9 g/dL — AB (ref 3.5–5.0)
ALK PHOS: 51 U/L (ref 38–126)
ALT: 53 U/L (ref 17–63)
AST: 42 U/L — AB (ref 15–41)
Anion gap: 10 (ref 5–15)
BILIRUBIN TOTAL: 0.5 mg/dL (ref 0.3–1.2)
BUN: 29 mg/dL — AB (ref 6–20)
CALCIUM: 9.7 mg/dL (ref 8.9–10.3)
CO2: 26 mmol/L (ref 22–32)
CREATININE: 1.14 mg/dL (ref 0.61–1.24)
Chloride: 98 mmol/L — ABNORMAL LOW (ref 101–111)
GFR calc Af Amer: >60 mL/min (ref >60)
GFR calc non Af Amer: 55 mL/min — ABNORMAL LOW (ref >60)
GLUCOSE: 221 mg/dL — AB (ref 65–99)
Potassium: 2.7 mmol/L — CL (ref 3.5–5.1)
Sodium: 134 mmol/L — ABNORMAL LOW (ref 135–145)
TOTAL PROTEIN: 5.7 g/dL — AB (ref 6.5–8.1)

## 2016-09-07 LAB — URINALYSIS, ROUTINE W REFLEX MICROSCOPIC
Bilirubin Urine: NEGATIVE
GLUCOSE, UA: NEGATIVE mg/dL
HGB URINE DIPSTICK: NEGATIVE
KETONES UR: NEGATIVE mg/dL
LEUKOCYTES UA: NEGATIVE
Nitrite: NEGATIVE
PROTEIN: NEGATIVE mg/dL
Specific Gravity, Urine: 1.021 (ref 1.005–1.030)
pH: 5.5 (ref 5.0–8.0)

## 2016-09-07 LAB — MAGNESIUM: Magnesium: 1.8 mg/dL (ref 1.7–2.4)

## 2016-09-07 LAB — CBC WITH DIFFERENTIAL/PLATELET
BASOS ABS: 0 10*3/uL (ref 0.0–0.1)
BASOS PCT: 0 %
EOS ABS: 0.1 10*3/uL (ref 0.0–0.7)
EOS PCT: 1 %
HCT: 27.4 % — ABNORMAL LOW (ref 39.0–52.0)
Hemoglobin: 9.4 g/dL — ABNORMAL LOW (ref 13.0–17.0)
LYMPHS PCT: 15 %
Lymphs Abs: 1.9 10*3/uL (ref 0.7–4.0)
MCH: 31.1 pg (ref 26.0–34.0)
MCHC: 34.3 g/dL (ref 30.0–36.0)
MCV: 90.7 fL (ref 78.0–100.0)
MONO ABS: 1.3 10*3/uL — AB (ref 0.1–1.0)
Monocytes Relative: 11 %
Neutro Abs: 8.9 10*3/uL — ABNORMAL HIGH (ref 1.7–7.7)
Neutrophils Relative %: 73 %
PLATELETS: 255 10*3/uL (ref 150–400)
RBC: 3.02 MIL/uL — ABNORMAL LOW (ref 4.22–5.81)
RDW: 13.8 % (ref 11.5–15.5)
WBC: 12.1 10*3/uL — ABNORMAL HIGH (ref 4.0–10.5)

## 2016-09-07 LAB — INFLUENZA PANEL BY PCR (TYPE A & B)
INFLBPCR: NEGATIVE
Influenza A By PCR: NEGATIVE

## 2016-09-07 LAB — LACTIC ACID, PLASMA: LACTIC ACID, VENOUS: 0.8 mmol/L (ref 0.5–1.9)

## 2016-09-07 LAB — PHOSPHORUS: PHOSPHORUS: 3.2 mg/dL (ref 2.5–4.6)

## 2016-09-07 LAB — PROCALCITONIN

## 2016-09-07 LAB — GLUCOSE, CAPILLARY: GLUCOSE-CAPILLARY: 160 mg/dL — AB (ref 65–99)

## 2016-09-07 MED ORDER — HYDRALAZINE HCL 20 MG/ML IJ SOLN: 10.0000 mg | Freq: Four times a day (QID) | INTRAMUSCULAR | Status: DC | PRN

## 2016-09-07 MED ORDER — ACETAMINOPHEN 325 MG PO TABS
650.0000 mg | ORAL_TABLET | Freq: Once | ORAL | Status: AC
  Administered 2016-09-07: 650 mg via ORAL
  Filled 2016-09-07: qty 2

## 2016-09-07 MED ORDER — ACETAMINOPHEN 325 MG PO TABS
650.0000 mg | ORAL_TABLET | Freq: Four times a day (QID) | ORAL | Status: DC | PRN
  Administered 2016-09-08 – 2016-09-15 (×12): 650 mg via ORAL
  Filled 2016-09-07 (×12): qty 2

## 2016-09-07 MED ORDER — SODIUM CHLORIDE 0.9% FLUSH
3.0000 mL | Freq: Two times a day (BID) | INTRAVENOUS | Status: DC
  Administered 2016-09-08 – 2016-09-15 (×10): 3 mL via INTRAVENOUS

## 2016-09-07 MED ORDER — SODIUM CHLORIDE 0.9 % IV SOLN
30.0000 meq | Freq: Once | INTRAVENOUS | Status: AC
  Administered 2016-09-07: 30 meq via INTRAVENOUS
  Filled 2016-09-07: qty 15

## 2016-09-07 MED ORDER — POTASSIUM CHLORIDE IN NACL 20-0.9 MEQ/L-% IV SOLN
INTRAVENOUS | Status: DC
  Administered 2016-09-07: 18:00:00 via INTRAVENOUS
  Administered 2016-09-08: 125 mL/h via INTRAVENOUS
  Filled 2016-09-07 (×3): qty 1000

## 2016-09-07 MED ORDER — DEXTROSE 5 % IV SOLN
1.0000 g | INTRAVENOUS | Status: DC
  Administered 2016-09-08: 1 g via INTRAVENOUS
  Filled 2016-09-07 (×3): qty 1

## 2016-09-07 MED ORDER — SODIUM CHLORIDE 0.9 % IV BOLUS (SEPSIS)
500.0000 mL | Freq: Once | INTRAVENOUS | Status: AC
  Administered 2016-09-07: 500 mL via INTRAVENOUS

## 2016-09-07 MED ORDER — DIPHENHYDRAMINE HCL 12.5 MG/5ML PO ELIX
12.5000 mg | ORAL_SOLUTION | Freq: Once | ORAL | Status: AC | PRN
  Administered 2016-09-08: 12.5 mg via ORAL
  Filled 2016-09-07: qty 5

## 2016-09-07 MED ORDER — ACETAMINOPHEN 650 MG RE SUPP: 650.0000 mg | Freq: Four times a day (QID) | RECTAL | Status: DC | PRN

## 2016-09-07 MED ORDER — VANCOMYCIN HCL IN DEXTROSE 1-5 GM/200ML-% IV SOLN
1000.0000 mg | INTRAVENOUS | Status: DC
Start: 2016-09-07 — End: 2016-09-08
  Administered 2016-09-08: 1000 mg via INTRAVENOUS
  Filled 2016-09-07 (×2): qty 200

## 2016-09-07 MED ORDER — INSULIN ASPART 100 UNIT/ML ~~LOC~~ SOLN
0.0000 [IU] | SUBCUTANEOUS | Status: DC
  Administered 2016-09-07: 2 [IU] via SUBCUTANEOUS
  Administered 2016-09-08 (×3): 1 [IU] via SUBCUTANEOUS
  Administered 2016-09-08: 2 [IU] via SUBCUTANEOUS

## 2016-09-07 NOTE — Progress Notes (Deleted)
Pharmacy Antibiotic Note  Kenneth Stark is a 82 y.o. male admitted on 09/07/2016 with pneumonia.  Pharmacy has been consulted for vanc dosing.  82 yo who was admitted for AMS. He was recently at [employer] Apparently, he has been running low fevers. He has unclear leukocytosis with a possible infiltrate. Vanc and cefepime have been ordered to r/o PNA.   Plan:  Vanc 1g IV q24 Level as needed     Temp (24hrs), Avg:98.2 F (36.8 C), Min:97.9 F (36.6 C), Max:98.3 F (36.8 C)   Recent Labs Lab 09/01/16 1201 09/01/16 2058 09/02/16 0414 09/03/16 0235 09/03/16 1230 09/04/16 0124 09/07/16 1300  WBC 17.4* 9.7 10.1 12.7*  --  9.9 12.1*  CREATININE 0.94  --   --  1.01 0.89 0.9 1.14    Estimated Creatinine Clearance: 43.3 mL/min (by C-G formula based on SCr of 1.14 mg/dL).    No Known Allergies  Antimicrobials this admission: 11/23 vanc >>  11/23 cefepime >>   Dose adjustments this admission:   Microbiology results: 11/23 BCx:  11/23 Sputum:  MRSA PCR:   Kenneth Stark, PharmD, BCPS, AAHIVP, CPP Infectious Disease Pharmacist Pager: [phone] 09/07/2016 5:03 PM

## 2016-09-07 NOTE — H&P (Signed)
History and Physical    Kenneth Stark [id number] DOB: [birth year] DOA: 09/07/2016   PCP: Geoffery Lyons, MD   Patient coming from/Resides with: Prior to last hospitalization at private residence-currently at [facility] for rehabilitation  Admission status: Inpatient/stepdown -medically necessary to stay a minimum 2 midnights to rule out impending and/or unexpected changes in physiologic status that may differ from initial evaluation performed in the ER and/or at time of admission. Presents with altered mentation with etiology not clear at time of admission: Either from evolution of known subdural/subarachnoid hemorrhage versus infectious etiology with associated dehydration. Patient will require frequent neurological checks, IV fluids, swallowing evaluation prior to resumption of diet, potential for IV antibiotic therapies and follow-up of infectious workup initiated in the ER and at time of admission.  Chief Complaint: Altered mental status  HPI: Kenneth Stark is a 82 y.o. male with medical history significant for TIAs on Plavix, hypertension, anemia, dyslipidemia and essential tremor. Patient was recently discharged to [place] skilled nurse facility on 11/19 after an admission for subdural hematoma/subarachnoid hemorrhage after a fall while taking Plavix. Although patient had not returned to baseline mentation according to his wife his speech and motor activity had improved and was able to participate fully with physical therapy and was able to feed himself. Since discharge patient has had poor oral intake and by Wednesday she noticed a significant decline in his functional abilities including inability to feed himself. He was more sleepy and had been running low-grade fevers. Patient apparently has a chronic cough which has not worsened. She has not noticed him choking or coughing while eating. He has not had any nausea vomiting or diarrhea.  ED Course:  Vital Signs: BP 118/75  (BP Location: Right Arm)   Pulse 61   Temp 97.9 F (36.6 C) (Oral)   Resp 15   SpO2 91%  CT head without contrast: Evolving subdural and subarachnoid blood products left greater than right. Increased prominence of intraventricular hemorrhage likely reflecting layering blood from previous hemorrhage. No acute hemorrhage. Left side mass effect without midline shift with mass effect apparently being new when compared to previous CTs during previous admission PCXR: Mild atelectasis in left base with a small left effusion not excluded by radiologist Lab data: Sodium 134, potassium 2.7, chloride 98, BUN 29, creatinine 1.14, glucose 221, anion gap 10, magnesium 1.8, albumin 2.9, AST 42, total bilirubin 0.5, white count 12,100, neutrophils 73%, neutrophils 8.9%, hemoglobin 9.4, platelets 255,000 Medications and treatments: Normal saline bolus 500 mL 1, 30 mEq potassium IV 1, Tylenol 650 mg by mouth 1  Review of Systems:  In addition to the HPI above,  No myalgias or other constitutional symptoms No Headache, changes with Vision or hearing, new weakness, tingling, numbness in any extremity, dizziness, more lethargic than prior to discharge with some difficulty in expressing self but seems to be more related to lethargy as opposed to true dysarthria No gross or observed problems swallowing food or Liquids, indigestion/reflux, no observed choking or coughing while eating, abdominal pain with or after eating No Chest pain, Cough or Shortness of Breath, palpitations, orthopnea or DOE No Abdominal pain, N/V, melena,hematochezia, dark tarry stools, constipation No dysuria, malodorous urine, hematuria or flank pain No new skin rashes, lesions, masses or bruises, No new joint pains, aches, swelling or redness No recent unintentional weight gain or loss No polyuria, polydypsia or polyphagia   Past Medical History:  Diagnosis Date  . Abnormality of gait   . Arthritis   . Cervical  spondylosis   .  Degenerative joint disease (DJD) of lumbar spine   . Diplopia   . Dyslipidemia   . Essential tremor   . Hearing difficulty    hearing aids  . Hypertension   . 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 Small vessel disease     Past Surgical History:  Procedure Laterality Date  . KNEE ARTHROSCOPY Left    Dr. Hart Robinsons 2002  . knee injections Right    Dr. Adriana Mccallum    Social History   Social History  . Marital status: Married    Spouse name: N/A  . Number of children: 4  . Years of education: N/A   Occupational History  . Retired     [employer]   Social History Main Topics  . Smoking status: Never Smoker  . Smokeless tobacco: Never Used  . Alcohol use Yes  . Drug use: No  . Sexual activity: Not on file   Other Topics Concern  . Not on file   Social History Narrative  . No narrative on file    Mobility: [place]-recent discharge to skilled nursing facility for continued rehabilitative therapies   No Known Allergies  Family History  Problem Relation Age of Onset  . Stroke Mother   . Stroke Father   . Heart disease Father   . Dementia Brother   . Renal Disease Brother   . Renal Disease Daughter      Prior to Admission medications   Medication Sig Start Date End Date Taking? Authorizing Provider  acetaminophen (TYLENOL) 325 MG tablet Take 2 tablets (650 mg total) by mouth every 6 (six) hours as needed for mild pain (or Fever >/= 101). 09/03/16   Maryann Mikhail, DO  amLODipine (NORVASC) 10 MG tablet Take 10 mg by mouth daily.    Historical Provider, MD  Artificial Tear Ointment (EYE LUBRICANT OP) Place 1 drop into both eyes daily.    Historical Provider, MD  Ascorbic Acid (VITAMIN C) 1000 MG tablet Take 1,000 mg by mouth 3 (three) times daily.     Historical Provider, MD  aspirin 81 MG tablet RESTART IN ONE WEEK FROM 09/04/16    Historical Provider, MD  Calcium Carbonate-Vitamin D (CALTRATE 600+D PO) Take 600 mg by  mouth 3 (three) times daily.     Historical Provider, MD  clopidogrel (PLAVIX) 75 MG tablet RESTART IN ONE WEEK FROM 09/04/16 08/12/16   Historical Provider, MD  Co-Enzyme Q-10 100 MG CAPS Take 100 mg by mouth daily.    Historical Provider, MD  escitalopram (LEXAPRO) 10 MG tablet Take 10 mg by mouth daily.    Historical Provider, MD  hydrochlorothiazide (HYDRODIURIL) 25 MG tablet Take 25 mg by mouth daily.    Historical Provider, MD  Lutein-Zeaxanthin 25-5 MG CAPS Take 1 capsule by mouth daily.    Historical Provider, MD  Memantine HCl-Donepezil HCl (NAMZARIC) 28-10 MG CP24 Take 1 capsule by mouth every evening.    Historical Provider, MD  mirtazapine (REMERON) 30 MG tablet Take 30 mg by mouth at bedtime.    Historical Provider, MD  Misc Natural Products (GLUCOSAMINE CHOND COMPLEX/MSM PO) Take 1 tablet by mouth 3 (three) times daily.     Historical Provider, MD  Multiple Vitamin (MULTIVITAMIN) tablet Take 1 tablet by mouth daily.    Historical Provider, MD  Omega-3 Fatty Acids (OMEGA 3 PO) Take 1 capsule by mouth 2 (two) times daily.  Historical Provider, MD  polyethylene glycol (MIRALAX / GLYCOLAX) packet Take 17 g by mouth daily. With 8oz of water    Historical Provider, MD  potassium chloride (K-DUR) 10 MEQ tablet Take 10 mEq by mouth daily. 11/05/13   Historical Provider, MD  pravastatin (PRAVACHOL) 20 MG tablet Take 20 mg by mouth daily. 11/05/13   Historical Provider, MD  RAPAFLO 8 MG CAPS capsule Take 8 mg by mouth daily. 11/05/13   Historical Provider, MD  saw palmetto 500 MG capsule Take 500 mg by mouth daily.    Historical Provider, MD  telmisartan (MICARDIS) 80 MG tablet Take 80 mg by mouth daily. 11/05/13   Historical Provider, MD  triamcinolone (NASACORT) 55 MCG/ACT AERO nasal inhaler Place 2 sprays into the nose at bedtime.    Historical Provider, MD    Physical Exam: Vitals:   09/07/16 1315 09/07/16 1345 09/07/16 1400 09/07/16 1406  BP:   118/75 118/75  Pulse:  78 66 61  Resp:   15 20 15   Temp: 98.3 F (36.8 C)   97.9 F (36.6 C)  TempSrc: Oral   Oral  SpO2:  95% 97% 91%      Constitutional: NAD, calm, comfortable-Sleepy Eyes: PERRL, lids and conjunctivae normal ENMT: Mucous membranes are dry and sticky. Posterior pharynx clear of any exudate or lesions.Normal dentition.  Neck: normal, supple, no masses, no thyromegaly Respiratory: clear to auscultation bilaterally somewhat diminished in bases, no wheezing, no crackles. Normal respiratory effort. No accessory muscle use. When patient alert and awake O2 sats on room air 90-92% and when sleeping O2 sats dropped to as low as 88% although no definitive change in depth of respiratory volume observed Cardiovascular: Regular rate and rhythm, no murmurs / rubs / gallops. No extremity edema. 2+ pedal pulses. No carotid bruits.  Abdomen: no tenderness, no masses palpated. No hepatosplenomegaly. Bowel sounds positive.  Musculoskeletal: no clubbing / cyanosis. No joint deformity upper and lower extremities. Good ROM, no contractures. Normal muscle tone.  Skin: no rashes, lesions, ulcers. No induration Neurologic: CN 2-12 grossly intact. Sensation intact, DTR normal. Strength 4/5 x all 4 extremities with upper extremity strength the strongest at 5/5. Gait and ambulation were not assessed Psychiatric: Awakens to voice and tactile stimulation and oriented x name only. Sleepy and lethargic although follows commands easily once awakened   Labs on Admission: I have personally reviewed following labs and imaging studies  CBC:  Recent Labs Lab 09/01/16 1201 09/01/16 2058 09/02/16 0414 09/03/16 0235 09/04/16 0124 09/07/16 1300  WBC 17.4* 9.7 10.1 12.7* 9.9 12.1*  NEUTROABS 15.2*  --   --   --   --  8.9*  HGB 12.0* 9.9* 9.9* 10.7* 10.1* 9.4*  HCT 35.8* 29.5* 30.1* 31.8* 29* 27.4*  MCV 92.0 91.3 92.0 91.9  --  90.7  PLT 220 247 253 216 231 [id number]   Basic Metabolic Panel:  Recent Labs Lab 09/01/16 1201 09/03/16 0235  09/03/16 1230 09/04/16 0124 09/07/16 1300 09/07/16 1402  NA 138 138 138 141 134*  --   K 3.1* 2.8* 3.3* 3.6 2.7*  --   CL 104 103 103  --  98*  --   CO2 24 26 27   --  26  --   GLUCOSE 210* 195* 234*  --  221*  --   BUN 21* 19 19 22* 29*  --   CREATININE 0.94 1.01 0.89 0.9 1.14  --   CALCIUM 9.7 9.2 8.9  --  9.7  --  MG  --  1.9  --   --   --  1.8   GFR: Estimated Creatinine Clearance: 43.3 mL/min (by C-G formula based on SCr of 1.14 mg/dL). Liver Function Tests:  Recent Labs Lab 09/07/16 1300  AST 42*  ALT 53  ALKPHOS 51  BILITOT 0.5  PROT 5.7*  ALBUMIN 2.9*   No results for input(s): LIPASE, AMYLASE in the last 168 hours. No results for input(s): AMMONIA in the last 168 hours. Coagulation Profile:  Recent Labs Lab 09/01/16 1201  INR 1.07   Cardiac Enzymes: No results for input(s): CKTOTAL, CKMB, CKMBINDEX, TROPONINI in the last 168 hours. BNP (last 3 results) No results for input(s): PROBNP in the last 8760 hours. HbA1C: No results for input(s): HGBA1C in the last 72 hours. CBG: No results for input(s): GLUCAP in the last 168 hours. Lipid Profile: No results for input(s): CHOL, HDL, LDLCALC, TRIG, CHOLHDL, LDLDIRECT in the last 72 hours. Thyroid Function Tests: No results for input(s): TSH, T4TOTAL, FREET4, T3FREE, THYROIDAB in the last 72 hours. Anemia Panel: No results for input(s): VITAMINB12, FOLATE, FERRITIN, TIBC, IRON, RETICCTPCT in the last 72 hours. Urine analysis:    Component Value Date/Time   COLORURINE YELLOW 09/07/2016 1350   APPEARANCEUR CLEAR 09/07/2016 1350   LABSPEC 1.021 09/07/2016 1350   PHURINE 5.5 09/07/2016 1350   GLUCOSEU NEGATIVE 09/07/2016 1350   HGBUR NEGATIVE 09/07/2016 1350   BILIRUBINUR NEGATIVE 09/07/2016 1350   KETONESUR NEGATIVE 09/07/2016 1350   PROTEINUR NEGATIVE 09/07/2016 1350   NITRITE NEGATIVE 09/07/2016 1350   LEUKOCYTESUR NEGATIVE 09/07/2016 1350   Sepsis  Labs: @LABRCNTIP (procalcitonin:4,lacticidven:4) ) Recent Results (from the past 240 hour(s))  MRSA PCR Screening     Status: None   Collection Time: 09/01/16  8:58 PM  Result Value Ref Range Status   MRSA by PCR NEGATIVE NEGATIVE Final    Comment:        The GeneXpert MRSA Assay (FDA approved for NASAL specimens only), is one component of a comprehensive MRSA colonization surveillance program. It is not intended to diagnose MRSA infection nor to guide or monitor treatment for MRSA infections.      Radiological Exams on Admission: Ct Head Wo Contrast  Result Date: 09/07/2016 CLINICAL DATA:  Altered mental status.  Increased lethargy. EXAM: CT HEAD WITHOUT CONTRAST TECHNIQUE: Contiguous axial images were obtained from the base of the skull through the vertex without intravenous contrast. COMPARISON:  CT head without contrast 09/02/2016 and 09/01/2016. FINDINGS: Brain: There is expected evolution of left subdural and subarachnoid hemorrhage. The size of the subdural component has not changed significantly. There is mixed density compatible with evolution of blood products. Additional subarachnoid and subdural blood layers along the tentorium, left greater than right. Intraventricular hemorrhage is more prominent than on the prior exam. Minimal left to right midline shift is present. There is some local mass effect on the left with crowding of the sulci and partial effacement of the left lateral ventricle. No definite new areas of hemorrhage are present. There is slight dilation of the right lateral ventricle without hydrocephalus. Vascular: Extensive atherosclerotic calcifications are present within the cavernous internal carotid arteries. There is no hyperdense vessel. Skull: The calvarium is intact. Sinuses/Orbits: The paranasal sinuses and mastoid air cells are clear. IMPRESSION: 1. Evolving subdural and subarachnoid blood products, left greater than right. 2. Increased prominence of  intraventricular hemorrhage, likely reflecting layering blood from the previous hemorrhage. 3. No definite acute hemorrhage. 4. Left-sided mass effect without significant midline shift. Electronically  Signed   By: San Morelle M.D.   On: 09/07/2016 13:13   Dg Chest Port 1 View  Result Date: 09/07/2016 CLINICAL DATA:  Fever.  Acute mental status change. EXAM: PORTABLE CHEST 1 VIEW COMPARISON:  January 15, 2007 FINDINGS: The heart, hila, and mediastinum are unchanged. No pneumothorax. Mild atelectasis in the left base. A small left effusion is not excluded. IMPRESSION: Mild atelectasis in the left base. A small left effusion is not excluded. Electronically Signed   By: Dorise Bullion III M.D   On: 09/07/2016 14:22    EKG: (Independently reviewed) sinus rhythm with underlying right bundle branch block, ventricular rate 61 bpm, QTC 444 ms-no old EKGs for comparison  Assessment/Plan Principal Problem:   Altered mental status -Etiology unclear but differential includes: Evolution of SDH/SAH vs acute dehydration vs infection -Treat underlying causes -Close monitoring in the stepdown unit with frequent neurological checks -NPO until can pass swallowing evaluation -begin with RN bedside test while in ER  -Patient apparently has a degree of dementia noting preadmission medication of memantine and donezepil plus HS Remeron -Also takes  Active Problems:   Subdural hematoma/Subarachnoid hemorrhage -EDP consulted neurosurgery and asked them to review the CT scans -Neurosurgeon on call reported no significant issues with patient's current CT scan that would explain his altered mentation-expected changes only -Frequent neurological checks as above -Continue to hold Plavix for now; at time of discharge on 11/19 recommendation was for 1 additional week to hold and then resume    Leukocytosis -Etiology unclear but suspect related to infection although dehydration could be culprit -Urinalysis  unremarkable -Possible early left infiltrate left lung -Chek Procalcitonin and serum lactate -Obtain blood cultures -Follow trends; wife reported fevers at nursing facility but has remained afebrile in ER    Acute respiratory failure with hypoxia  -?? left infiltrate in patient with demonstrated hypoxemia while in ER -Hypoxia could also be from hypoventilation in setting of altered mentation -Empiric antibiotics to cover for possible aspiration pneumonitis as well as HCAP -Supportive care:  oxygen prn saturations less than 92% -If hypoxemia persists recommend repeating chest x-ray after hydration to see if infiltrate "fluffs out"    Essential hypertension -Moderate control -On thiazide diuretic at home which will be held in setting of dehydration/recent poor oral intake -BUN elevated but creatinine within normal limits so likely can continue preadmission telmisartan as well as Norvasc once patient has passed a swallowing evaluation -prn IV hydralazine    Acute hyperglycemia -CBGs greater than 200 with normal anion gap -No known diagnosis of diabetes -Follow CBGs every 4 hours while npo provide SSI -Hemoglobin A1c    Dehydration -BUN elevated at 29 with creatinine within baseline of between 0.9 and 1.1 -IV fluids at 125 mL per hour -Has pseudo-hyponatremia secondary to hyperglycemia and associated hypokalemia so we'll put potassium in maintenance fluids    Transaminitis -Nonobstructive pattern likely from dehydration -Repeat LFTs prior to discharge; sooner if develops abdominal pain    Dyslipidemia -Resume statin and omega-3 fatty acids once diet resumed    Essential tremor -No worsening of the symptoms reported by wife    History of TIA (transient ischemic attack) -Preadmission medications included statin and baby aspirin    Anemia -Hemoglobin stable between baseline of 9 and 10      DVT prophylaxis: SCD Code Status: Full  Family Communication: Wife at  bedside Disposition Plan: Anticipate discharge back to skilled nursing facility once medically stable Consults called: Telephone consultation by EDP with neurosurgeon on  call to discuss recent CT scan    Emillia Weatherly L. ANP-BC [facility] Pager [phone]   If 7PM-7AM, please contact night-coverage [email or web address] Password [email or web address]  09/07/2016, 4:16 PM

## 2016-09-07 NOTE — ED Provider Notes (Signed)
[facility] DEPT Provider Note   CSN: [id number] Arrival date & time: 09/07/16  1211     History   Chief Complaint Chief Complaint  Patient presents with  . Altered Mental Status    HPI Kenneth Stark is a 82 y.o. male.  Level V Caveat: AMS   Kenneth Stark is a 82 y.o. Male who presents to the ED from [place] via EMS with his wife and son who report a gradual decline in his mental status over the past two days. Last week the patient was admitted to the hospital after a fall with subarachnoid hemorrhage and is of dural hematoma. Patient was discharged back to assisted living facility and patient's wife reports that over the past 2 days she seen a gradual decline in his mental status. She reports the patient developed fevers on Tuesday. The report a maximum temperature of 100.9 yesterday. Nursing facility has been providing Tylenol. Family is unsure of the last dose. Family reports the patient is been more confused and more sleepy than usual over the past 2 days. Family denies coughing, vomiting, diarrhea, changes to his urination or rashes.   The history is provided by medical records, a caregiver and a relative. No language interpreter was used.  Altered Mental Status      Past Medical History:  Diagnosis Date  . Abnormality of gait   . Arthritis   . Cervical spondylosis   . Degenerative joint disease (DJD) of lumbar spine   . Diplopia   . Dyslipidemia   . Essential tremor   . Hearing difficulty    hearing aids  . Hypertension   . 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 Small vessel disease     Patient Active Problem List   Diagnosis Date Noted  . Altered mental status 09/07/2016  . Subarachnoid hemorrhage ([place]) 09/07/2016  . Acute hyperglycemia 09/07/2016  . Dehydration 09/07/2016  . Transaminitis 09/07/2016  . Anemia 09/07/2016  . Leukocytosis 09/07/2016  . Acute respiratory failure with hypoxia ([place]) 09/07/2016  .  Subdural hematoma ([place]) 09/01/2016  . Complex laceration of left ear 09/01/2016  . Cerebrovascular disease 09/01/2016  . History of TIA (transient ischemic attack) 09/01/2016  . Essential hypertension   . Sixth nerve palsy   . Cervical spondylosis   . Degenerative joint disease (DJD) of lumbar spine   . Arthritis   . Dyslipidemia   . Essential tremor   . Abnormality of gait 11/27/2013    Past Surgical History:  Procedure Laterality Date  . KNEE ARTHROSCOPY Left    Dr. Hart Robinsons 2002  . knee injections Right    Dr. Adriana Mccallum       Home Medications    Prior to Admission medications   Medication Sig Start Date End Date Taking? Authorizing Provider  acetaminophen (TYLENOL) 325 MG tablet Take 2 tablets (650 mg total) by mouth every 6 (six) hours as needed for mild pain (or Fever >/= 101). 09/03/16  Yes Maryann Mikhail, DO  amLODipine (NORVASC) 10 MG tablet Take 10 mg by mouth daily.   Yes Historical Provider, MD  Artificial Tear Ointment (EYE LUBRICANT OP) Place 1 drop into both eyes daily.   Yes Historical Provider, MD  Ascorbic Acid (VITAMIN C) 1000 MG tablet Take 1,000 mg by mouth 3 (three) times daily.    Yes Historical Provider, MD  Calcium Carbonate-Vitamin D (CALTRATE 600+D PO) Take 600 mg by mouth 3 (three) times daily.  Yes Historical Provider, MD  Co-Enzyme Q-10 100 MG CAPS Take 100 mg by mouth daily.   Yes Historical Provider, MD  escitalopram (LEXAPRO) 10 MG tablet Take 10 mg by mouth daily.   Yes Historical Provider, MD  hydrochlorothiazide (HYDRODIURIL) 25 MG tablet Take 25 mg by mouth daily.   Yes Historical Provider, MD  Lutein-Zeaxanthin 25-5 MG CAPS Take 1 capsule by mouth daily.   Yes Historical Provider, MD  Memantine HCl-Donepezil HCl (NAMZARIC) 28-10 MG CP24 Take 1 capsule by mouth every evening.   Yes Historical Provider, MD  mirtazapine (REMERON) 30 MG tablet Take 30 mg by mouth at bedtime.   Yes Historical Provider, MD  Misc Natural Products  (GLUCOSAMINE CHOND COMPLEX/MSM PO) Take 1 tablet by mouth 3 (three) times daily.    Yes Historical Provider, MD  Multiple Vitamin (MULTIVITAMIN) tablet Take 1 tablet by mouth daily.   Yes Historical Provider, MD  Omega-3 Fatty Acids (OMEGA 3 PO) Take 1 capsule by mouth 2 (two) times daily.    Yes Historical Provider, MD  polyethylene glycol (MIRALAX / GLYCOLAX) packet Take 17 g by mouth daily. With 8oz of water   Yes Historical Provider, MD  potassium chloride (K-DUR) 10 MEQ tablet Take 10 mEq by mouth daily. 11/05/13  Yes Historical Provider, MD  pravastatin (PRAVACHOL) 20 MG tablet Take 20 mg by mouth daily. 11/05/13  Yes Historical Provider, MD  RAPAFLO 8 MG CAPS capsule Take 8 mg by mouth daily. 11/05/13  Yes Historical Provider, MD  saw palmetto 500 MG capsule Take 500 mg by mouth daily.   Yes Historical Provider, MD  telmisartan (MICARDIS) 80 MG tablet Take 80 mg by mouth daily. 11/05/13  Yes Historical Provider, MD  triamcinolone (NASACORT) 55 MCG/ACT AERO nasal inhaler Place 2 sprays into the nose at bedtime.   Yes Historical Provider, MD  aspirin 81 MG tablet RESTART IN ONE WEEK FROM 09/04/16    Historical Provider, MD  clopidogrel (PLAVIX) 75 MG tablet Take 75 mg by mouth daily. RESTART IN ONE WEEK FROM 09/04/16 08/12/16   Historical Provider, MD    Family History Family History  Problem Relation Age of Onset  . Stroke Mother   . Stroke Father   . Heart disease Father   . Dementia Brother   . Renal Disease Brother   . Renal Disease Daughter     Social History Social History  Substance Use Topics  . Smoking status: Never Smoker  . Smokeless tobacco: Never Used  . Alcohol use Yes     Allergies   Patient has no known allergies.   Review of Systems Review of Systems  Unable to perform ROS: Mental status change  Constitutional: Positive for fever.  Gastrointestinal: Negative for diarrhea and vomiting.  Skin: Negative for rash.     Physical Exam Updated Vital Signs BP  118/75 (BP Location: Right Arm)   Pulse 61   Temp 97.9 F (36.6 C) (Oral)   Resp 15   SpO2 91%   Physical Exam  Constitutional: He appears well-developed and well-nourished. No distress.  HENT:  Head: Normocephalic and atraumatic.  Right Ear: External ear normal.  Mucous membranes slightly dry. Old laceration that has been repaired with stitches to his left ear noted. No new signs of head injury.  Eyes: Conjunctivae are normal. Pupils are equal, round, and reactive to light. Right eye exhibits no discharge. Left eye exhibits no discharge.  Neck: Neck supple.  Cardiovascular: Normal rate, regular rhythm, normal heart sounds and intact  distal pulses.   Pulmonary/Chest: Effort normal and breath sounds normal. No respiratory distress. He has no wheezes. He has no rales.  Lungs clear to auscultation bilaterally.  Abdominal: Soft. There is no tenderness.  Abdomen is soft and nontender to palpation.  Musculoskeletal: He exhibits no edema or tenderness.  Lymphadenopathy:    He has no cervical adenopathy.  Neurological: He is alert. Coordination normal.  Patient is asleep when I enter the room. He will awaken with light stimuli. He is able to tell me his name. He is able to follow most commands. Good grip strengths bilaterally. He is able to lift his arms and his legs in the air without evidence of drift or focal weakness. EOMs are intact.  Skin: Skin is warm and dry. Capillary refill takes less than 2 seconds. No rash noted. He is not diaphoretic. No erythema. No pallor.  Psychiatric: He has a normal mood and affect. His behavior is normal.  Nursing note and vitals reviewed.    ED Treatments / Results  Labs (all labs ordered are listed, but only abnormal results are displayed) Labs Reviewed  COMPREHENSIVE METABOLIC PANEL - Abnormal; Notable for the following:       Result Value   Sodium 134 (*)    Potassium 2.7 (*)    Chloride 98 (*)    Glucose, Bld 221 (*)    BUN 29 (*)    Total  Protein 5.7 (*)    Albumin 2.9 (*)    AST 42 (*)    GFR calc non Af Amer 55 (*)    All other components within normal limits  CBC WITH DIFFERENTIAL/PLATELET - Abnormal; Notable for the following:    WBC 12.1 (*)    RBC 3.02 (*)    Hemoglobin 9.4 (*)    HCT 27.4 (*)    Neutro Abs 8.9 (*)    Monocytes Absolute 1.3 (*)    All other components within normal limits  CULTURE, BLOOD (ROUTINE X 2)  CULTURE, BLOOD (ROUTINE X 2)  CULTURE, EXPECTORATED SPUTUM-ASSESSMENT  GRAM STAIN  URINALYSIS, ROUTINE W REFLEX MICROSCOPIC (NOT AT [facility])  MAGNESIUM  HEMOGLOBIN A1C  PROCALCITONIN  LACTIC ACID, PLASMA  STREP PNEUMONIAE URINARY ANTIGEN  INFLUENZA PANEL BY PCR (TYPE A & B, H1N1)    EKG  EKG Interpretation  Date/Time:  Thursday September 07 2016 14:15:00 EST Ventricular Rate:  61 PR Interval:    QRS Duration: 167 QT Interval:  440 QTC Calculation: 444 R Axis:   -65 Text Interpretation:  Sinus rhythm RBBB and LAFB No significant change since last tracing Confirmed by KNOTT MD, DANIEL [phone]) on 09/07/2016 3:33:10 PM       Radiology Ct Head Wo Contrast  Result Date: 09/07/2016 CLINICAL DATA:  Altered mental status.  Increased lethargy. EXAM: CT HEAD WITHOUT CONTRAST TECHNIQUE: Contiguous axial images were obtained from the base of the skull through the vertex without intravenous contrast. COMPARISON:  CT head without contrast 09/02/2016 and 09/01/2016. FINDINGS: Brain: There is expected evolution of left subdural and subarachnoid hemorrhage. The size of the subdural component has not changed significantly. There is mixed density compatible with evolution of blood products. Additional subarachnoid and subdural blood layers along the tentorium, left greater than right. Intraventricular hemorrhage is more prominent than on the prior exam. Minimal left to right midline shift is present. There is some local mass effect on the left with crowding of the sulci and partial effacement of the left  lateral ventricle. No definite new areas of  hemorrhage are present. There is slight dilation of the right lateral ventricle without hydrocephalus. Vascular: Extensive atherosclerotic calcifications are present within the cavernous internal carotid arteries. There is no hyperdense vessel. Skull: The calvarium is intact. Sinuses/Orbits: The paranasal sinuses and mastoid air cells are clear. IMPRESSION: 1. Evolving subdural and subarachnoid blood products, left greater than right. 2. Increased prominence of intraventricular hemorrhage, likely reflecting layering blood from the previous hemorrhage. 3. No definite acute hemorrhage. 4. Left-sided mass effect without significant midline shift. Electronically Signed   By: San Morelle M.D.   On: 09/07/2016 13:13   Dg Chest Port 1 View  Result Date: 09/07/2016 CLINICAL DATA:  Fever.  Acute mental status change. EXAM: PORTABLE CHEST 1 VIEW COMPARISON:  January 15, 2007 FINDINGS: The heart, hila, and mediastinum are unchanged. No pneumothorax. Mild atelectasis in the left base. A small left effusion is not excluded. IMPRESSION: Mild atelectasis in the left base. A small left effusion is not excluded. Electronically Signed   By: Dorise Bullion III M.D   On: 09/07/2016 14:22    Procedures Procedures (including critical care time)  Medications Ordered in ED Medications  potassium chloride 30 mEq in sodium chloride 0.9 % 265 mL (KCL MULTIRUN) IVPB (30 mEq Intravenous Given 09/07/16 1425)  0.9 % NaCl with KCl 20 mEq/ L  infusion (not administered)  insulin aspart (novoLOG) injection 0-9 Units (not administered)  hydrALAZINE (APRESOLINE) injection 10 mg (not administered)  ceFEPIme (MAXIPIME) 1 g in dextrose 5 % 50 mL IVPB (not administered)  sodium chloride 0.9 % bolus 500 mL (500 mLs Intravenous New Bag/Given 09/07/16 1346)  acetaminophen (TYLENOL) tablet 650 mg (650 mg Oral Given 09/07/16 1430)     Initial Impression / Assessment and Plan / ED Course    I have reviewed the triage vital signs and the nursing notes.  Pertinent labs & imaging results that were available during my care of the patient were reviewed by me and considered in my medical decision making (see chart for details).  Clinical Course    This is a 82 y.o. Male who presents to the ED from [place] via EMS with his wife and son who report a gradual decline in his mental status over the past two days. Last week the patient was admitted to the hospital after a fall with subarachnoid hemorrhage and is of dural hematoma. Patient was discharged back to assisted living facility and patient's wife reports that over the past 2 days she seen a gradual decline in his mental status. She reports the patient developed fevers on Tuesday. The report a maximum temperature of 100.9 yesterday. Nursing facility has been providing Tylenol. On exam the patient is afebrile nontoxic appearing. He does appear slightly dry. He awakens with light stimuli and can tell me his name. He follows most commands. I see no evidence of any focal weakness. Abdomen is soft and nontender to palpation. Head CT here today shows evolving subdural and subarachnoid blood products. No definite acute hemorrhage. There is left-sided mass effect without significant midline shift. Overall, reassuring. No evidence of new hemorrhage. Urinalysis without signs of infection. CBC is normal for white count of 12,000. CMP is unremarkable for potassium of 2.7. EKG without significant changes from his last tracing. Patient started on IV potassium. Patient was given fluid bolus. Magnesium is within normal limits. Chest x-ray shows mild atelectasis in the left base. Small left effusion is not excluded.  Reevaluation patient is still slightly altered. Wife at bedside reports he has slight  improvement. As patient is altered and his hypokalemia will admit to medicine. Family agrees with plan for admission.  I consulted with hospitalist Ebony Hail who  accepted the patient for admission. She asks that I talk to neurosurgery about his new head CT.   I consulted with neurosurgeon Dr. Saintclair Halsted who looked at his CT scans. He does not see any need for surgical intervention. I advised hospitalist of this.    This patient was discussed with Dr. Colin Mulders who agrees with assessment and plan.  Final Clinical Impressions(s) / ED Diagnoses   Final diagnoses:  Altered mental status, unspecified altered mental status type    New Prescriptions New Prescriptions   No medications on file         Waynetta Pean, PA-C 09/07/16 [place], MD 09/14/16 1511

## 2016-09-07 NOTE — ED Triage Notes (Signed)
Per EMS pt has had increased lethargy, headache, slurred speech, and confusion for 2 days. Pt has had recent head bleed. Pt is from nursing home and takes plavix. Pt has left sided drift in arm and leg.

## 2016-09-07 NOTE — Progress Notes (Signed)
Pharmacy Antibiotic Note  Kenneth Stark is a 82 y.o. male admitted on 09/07/2016 with altered mental status. CXR shows possible early left infiltrate. Pharmacy has been consulted for vancomycin dosing. Renal function wnl.  Vancomycin trough goal 15-20  Plan: 1) Vancomycin 1g IV q24 2) Adjust cefepime to 1g IV q24 3) Follow renal function, cultures, LOT, level if needed     Temp (24hrs), Avg:98.2 F (36.8 C), Min:97.9 F (36.6 C), Max:98.3 F (36.8 C)   Recent Labs Lab 09/01/16 1201 09/01/16 2058 09/02/16 0414 09/03/16 0235 09/03/16 1230 09/04/16 0124 09/07/16 1300  WBC 17.4* 9.7 10.1 12.7*  --  9.9 12.1*  CREATININE 0.94  --   --  1.01 0.89 0.9 1.14    Estimated Creatinine Clearance: 43.3 mL/min (by C-G formula based on SCr of 1.14 mg/dL).    No Known Allergies  Antimicrobials this admission: 11/23 Vancomycin >> 11/23 Cefepime >>  Dose adjustments this admission: N/a  Microbiology results: 11/23 blood >>  Thank you for allowing pharmacy to be a part of this patient's care.  Deboraha Sprang 09/07/2016 4:53 PM

## 2016-09-08 ENCOUNTER — Inpatient Hospital Stay (HOSPITAL_COMMUNITY): Payer: Medicare Other

## 2016-09-08 DIAGNOSIS — G9340 Encephalopathy, unspecified: Secondary | ICD-10-CM

## 2016-09-08 DIAGNOSIS — E860 Dehydration: Secondary | ICD-10-CM

## 2016-09-08 DIAGNOSIS — E876 Hypokalemia: Secondary | ICD-10-CM

## 2016-09-08 LAB — CBC
HCT: 27 % — ABNORMAL LOW (ref 39.0–52.0)
HEMOGLOBIN: 9.1 g/dL — AB (ref 13.0–17.0)
MCH: 30.7 pg (ref 26.0–34.0)
MCHC: 33.7 g/dL (ref 30.0–36.0)
MCV: 91.2 fL (ref 78.0–100.0)
Platelets: 235 10*3/uL (ref 150–400)
RBC: 2.96 MIL/uL — AB (ref 4.22–5.81)
RDW: 14.1 % (ref 11.5–15.5)
WBC: 11.2 10*3/uL — ABNORMAL HIGH (ref 4.0–10.5)

## 2016-09-08 LAB — COMPREHENSIVE METABOLIC PANEL
ALK PHOS: 44 U/L (ref 38–126)
ALT: 53 U/L (ref 17–63)
ANION GAP: 9 (ref 5–15)
AST: 40 U/L (ref 15–41)
Albumin: 2.6 g/dL — ABNORMAL LOW (ref 3.5–5.0)
BUN: 20 mg/dL (ref 6–20)
CALCIUM: 9 mg/dL (ref 8.9–10.3)
CHLORIDE: 104 mmol/L (ref 101–111)
CO2: 26 mmol/L (ref 22–32)
Creatinine, Ser: 0.92 mg/dL (ref 0.61–1.24)
GFR calc non Af Amer: >60 mL/min (ref >60)
Glucose, Bld: 140 mg/dL — ABNORMAL HIGH (ref 65–99)
Potassium: 3.2 mmol/L — ABNORMAL LOW (ref 3.5–5.1)
SODIUM: 139 mmol/L (ref 135–145)
Total Bilirubin: 0.5 mg/dL (ref 0.3–1.2)
Total Protein: 5.3 g/dL — ABNORMAL LOW (ref 6.5–8.1)

## 2016-09-08 LAB — GLUCOSE, CAPILLARY
GLUCOSE-CAPILLARY: 143 mg/dL — AB (ref 65–99)
GLUCOSE-CAPILLARY: 189 mg/dL — AB (ref 65–99)
GLUCOSE-CAPILLARY: 256 mg/dL — AB (ref 65–99)
Glucose-Capillary: 124 mg/dL — ABNORMAL HIGH (ref 65–99)
Glucose-Capillary: 169 mg/dL — ABNORMAL HIGH (ref 65–99)

## 2016-09-08 LAB — HEMOGLOBIN A1C
HEMOGLOBIN A1C: 6.8 % — AB (ref 4.8–5.6)
Mean Plasma Glucose: 148 mg/dL

## 2016-09-08 LAB — STREP PNEUMONIAE URINARY ANTIGEN: Strep Pneumo Urinary Antigen: NEGATIVE

## 2016-09-08 MED ORDER — POLYVINYL ALCOHOL 1.4 % OP SOLN
1.0000 [drp] | OPHTHALMIC | Status: DC | PRN
  Filled 2016-09-08: qty 15

## 2016-09-08 MED ORDER — ZOLPIDEM TARTRATE 5 MG PO TABS
5.0000 mg | ORAL_TABLET | Freq: Every evening | ORAL | Status: DC | PRN
  Administered 2016-09-08: 5 mg via ORAL
  Filled 2016-09-08: qty 1

## 2016-09-08 MED ORDER — TAMSULOSIN HCL 0.4 MG PO CAPS
0.4000 mg | ORAL_CAPSULE | Freq: Every day | ORAL | Status: DC
  Administered 2016-09-08 – 2016-09-15 (×8): 0.4 mg via ORAL
  Filled 2016-09-08 (×8): qty 1

## 2016-09-08 MED ORDER — INSULIN ASPART 100 UNIT/ML ~~LOC~~ SOLN
0.0000 [IU] | Freq: Three times a day (TID) | SUBCUTANEOUS | Status: DC
  Administered 2016-09-08: 2 [IU] via SUBCUTANEOUS
  Administered 2016-09-09: 5 [IU] via SUBCUTANEOUS
  Administered 2016-09-09: 2 [IU] via SUBCUTANEOUS
  Administered 2016-09-09 – 2016-09-10 (×4): 3 [IU] via SUBCUTANEOUS
  Administered 2016-09-11: 2 [IU] via SUBCUTANEOUS
  Administered 2016-09-11 – 2016-09-13 (×5): 1 [IU] via SUBCUTANEOUS
  Administered 2016-09-13 – 2016-09-14 (×2): 2 [IU] via SUBCUTANEOUS
  Administered 2016-09-14: 1 [IU] via SUBCUTANEOUS
  Administered 2016-09-15: 2 [IU] via SUBCUTANEOUS
  Administered 2016-09-15: 1 [IU] via SUBCUTANEOUS

## 2016-09-08 MED ORDER — DEXAMETHASONE 4 MG PO TABS
4.0000 mg | ORAL_TABLET | Freq: Three times a day (TID) | ORAL | Status: DC
  Administered 2016-09-08 – 2016-09-10 (×6): 4 mg via ORAL
  Filled 2016-09-08 (×6): qty 1

## 2016-09-08 MED ORDER — INSULIN ASPART 100 UNIT/ML ~~LOC~~ SOLN
0.0000 [IU] | Freq: Every day | SUBCUTANEOUS | Status: DC
  Administered 2016-09-08: 3 [IU] via SUBCUTANEOUS
  Administered 2016-09-09 – 2016-09-10 (×2): 2 [IU] via SUBCUTANEOUS

## 2016-09-08 MED ORDER — POTASSIUM CHLORIDE CRYS ER 20 MEQ PO TBCR
40.0000 meq | EXTENDED_RELEASE_TABLET | Freq: Once | ORAL | Status: AC
  Administered 2016-09-08: 40 meq via ORAL
  Filled 2016-09-08: qty 2

## 2016-09-08 MED ORDER — LEVETIRACETAM 500 MG PO TABS
500.0000 mg | ORAL_TABLET | Freq: Two times a day (BID) | ORAL | Status: DC
  Administered 2016-09-08 – 2016-09-12 (×10): 500 mg via ORAL
  Filled 2016-09-08 (×11): qty 1

## 2016-09-08 MED ORDER — POTASSIUM CHLORIDE IN NACL 20-0.9 MEQ/L-% IV SOLN
INTRAVENOUS | Status: AC
  Administered 2016-09-08: 23:00:00 via INTRAVENOUS
  Filled 2016-09-08: qty 1000

## 2016-09-08 MED ORDER — TRIAMCINOLONE ACETONIDE 55 MCG/ACT NA AERO
2.0000 | INHALATION_SPRAY | Freq: Every day | NASAL | Status: DC
  Administered 2016-09-08 – 2016-09-14 (×7): 2 via NASAL
  Filled 2016-09-08 (×2): qty 21.6

## 2016-09-08 MED ORDER — PRAVASTATIN SODIUM 20 MG PO TABS
20.0000 mg | ORAL_TABLET | Freq: Every day | ORAL | Status: DC
  Administered 2016-09-08 – 2016-09-15 (×8): 20 mg via ORAL
  Filled 2016-09-08 (×8): qty 1

## 2016-09-08 NOTE — Progress Notes (Addendum)
PROGRESS NOTE  Kenneth Stark  [id number] DOB: 20-Jan-1925  DOA: 09/07/2016 PCP: Geoffery Lyons, MD   Brief Narrative:  82 year old male with PMH of CVA (Plavix held during recent admission), TIA, degenerative joint disease, HTN, essential tremor, arthritis, HLD, hospitalized 09/01/16-09/03/16 for left subdural hematoma/SAH and left ear laceration status post fall and head trauma. Neurosurgery had consulted and managed conservatively. Left ear laceration was sutured. He was discharged to [facility]. He seemed to gradually improve over the first couple of days with improving motor skills, clarity of speech and perception. However 2 days prior to this admission, he did not sleep well at night, progressively became confused, restless and was brought back to ED for concerns of worsening subdural hematoma. CT head in the ED did not show worsening. This was confirmed by discussing with neurosurgery. Patient admitted for further management. Neurosurgery consulted.   Assessment & Plan:   Principal Problem:   Altered mental status Active Problems:   Subdural hematoma (HCC)   Essential hypertension   Dyslipidemia   Essential tremor   History of TIA (transient ischemic attack)   Subarachnoid hemorrhage (HCC)   Acute hyperglycemia   Dehydration   Transaminitis   Anemia   Leukocytosis   Acute respiratory failure with hypoxia (HCC)   1.  Acute encephalopathy: Exact etiology not clear. However this may be multifactorial related to recent head trauma and subdural hematoma/SAH, disruption in sleep cycle, complicating underlying cognitive impairment/dementia, low index of suspicion for infection in the absence of signs and symptoms, seizures. Discussed with Dr. Saintclair Halsted, Neurosurgery and his input appreciated. SDH/SAH resolving and stable without significant mass effect. He has started patient on Keppra 500 MG twice a day and recommends low-dose steroids (will initiate). Check EEG. As per  discussion with spouse, mental status is improved. Monitor closely. Patient passed bedside RN swallow screen and diet initiated. Hold Aricept, Lexapro, Remeron for now. 2. Recent subdural hematoma/subarachnoid hemorrhage: Discussion as per problem #1. Stable or improving. Neurosurgery input appreciated. 3. Hypokalemia: Better. Continue to replace and follow. Magnesium 1.8. 4. Anemia: Stable. Follow CBC. 5. Type II DM: Hemoglobin A1c: 6.8. SSI. Monitor closely while he is on steroids. This is a new diagnosis for him. 6. Leukocytosis: Mild, 12.1. Could be stress response. Low index of suspicion for infection. Urine microscopy unremarkable. Chest x-ray shows left basilar atelectasis. Empirically started on IV cefepime and vancomycin on admission. Lactate 0.8. Pro-calcitonin <0.10. Repeat chest x-ray. If unremarkable, consider stopping all antibiotics and monitor clinically. 7. Acute respiratory failure with hypoxia:? Related to atelectasis. Repeat chest x-ray. Continue oxygen supplementation. Continue antibiotics for now. Incentive spirometry as able. 8. Essential hypertension: Mildly uncontrolled. HCTZ, amlodipine and ARB temporarily held. Continue when necessary IV hydralazine. 9. Dehydration: Improved. Reduce and continue gentle IV fluids for additional 24 hours. 10. Dyslipidemia: Continue home medications i.e. statins and omega-3 fatty acids. 11. Essential tremor: None noticed today. 12. History of TIA: Antiplatelets currently on hold due to recent SDH/SAH. Decision to be made regarding resumption. 13. History of dementia: Acute medical illness including recent head injury and bleed probably complicating situation. 14.  Status post recent fall, head injury and left flank bruising 15. Status post repair of left ear laceration: Supposed to follow up with ENT for suture removal.  DVT prophylaxis: SCDs Code Status: Full Family Communication: Discussed in detail with spouse at bedside Disposition  Plan: DC back to SNF when medically improved.   Consultants:   Neurosurgery  Procedures:   None  Antimicrobials:   Cefepime  11/23>  Vancomycin 11/23>   Subjective: Slightly somnolent and confused this morning. Oriented to self and partly to place. Denied complaints. As per RN, confused overnight, did not sleep much, upon insistence by family he received Benadryl 12.5 MG 1 at midnight. As per spouse, mental status is definitely better than on admission: More alert with periods of clarity where as he was very somnolent yesterday.   Objective:  Vitals:   09/07/16 2347 09/08/16 0251 09/08/16 0720 09/08/16 1100  BP: (!) 147/64 (!) 143/65 (!) 151/61 (!) 159/61  Pulse: 70 63 67 81  Resp: (!) 24 20 16  (!) 27  Temp: 98.1 F (36.7 C) 99 F (37.2 C) 98.3 F (36.8 C) 98.3 F (36.8 C)  TempSrc: Oral Axillary Oral Oral  SpO2: 99% 99% 97% 96%  Weight:  82.1 kg (181 lb)    Height:        Intake/Output Summary (Last 24 hours) at 09/08/16 1343 Last data filed at 09/08/16 0920  Gross per 24 hour  Intake           1877.5 ml  Output              575 ml  Net           1302.5 ml   Filed Weights   09/07/16 1819 09/08/16 0251  Weight: 83.1 kg (183 lb 3.2 oz) 82.1 kg (181 lb)    Examination:  General exam: Pleasant elderly male lying comfortably propped up in bed. Patient was examined with 2 RNs this morning.  Respiratory system: poor inspiratory effort. Diminished breath sounds in the bases but otherwise clear to auscultation. Respiratory effort normal. Cardiovascular system: S1 & S2 heard, RRR. No JVD, murmurs, rubs, gallops or clicks. No pedal edema. . Telemetry: Sinus rhythm with BBB morphology. Gastrointestinal system: Abdomen is nondistended, soft and nontender. No organomegaly or masses felt. Normal bowel sounds heard. condom cath  Central nervous system: Slightly somnolent but easily arousable this morning, oriented to self and place. Follows some simple instructions. No focal  deficits.  Extremities: Symmetric 5 x 5 power. Skin: areas of extensive bruising over left lower flank, left hip and over left ear-all of these appear subacute. Please see pictures below.  Psychiatry:  unable to assess secondary to mental status changes.        Data Reviewed: I have personally reviewed following labs and imaging studies  CBC:  Recent Labs Lab 09/01/16 2058 09/02/16 0414 09/03/16 0235 09/04/16 0124 09/07/16 1300 09/08/16 0603  WBC 9.7 10.1 12.7* 9.9 12.1* 11.2*  NEUTROABS  --   --   --   --  8.9*  --   HGB 9.9* 9.9* 10.7* 10.1* 9.4* 9.1*  HCT 29.5* 30.1* 31.8* 29* 27.4* 27.0*  MCV 91.3 92.0 91.9  --  90.7 91.2  PLT 247 253 216 231 255 [id number]   Basic Metabolic Panel:  Recent Labs Lab 09/03/16 0235 09/03/16 1230 09/04/16 0124 09/07/16 1300 09/07/16 1402 09/07/16 1844 09/08/16 0603  NA 138 138 141 134*  --   --  139  K 2.8* 3.3* 3.6 2.7*  --   --  3.2*  CL 103 103  --  98*  --   --  104  CO2 26 27  --  26  --   --  26  GLUCOSE 195* 234*  --  221*  --   --  140*  BUN 19 19 22* 29*  --   --  20  CREATININE 1.01 0.89 0.9  1.14  --   --  0.92  CALCIUM 9.2 8.9  --  9.7  --   --  9.0  MG 1.9  --   --   --  1.8  --   --   PHOS  --   --   --   --   --  3.2  --    GFR: Estimated Creatinine Clearance: 58 mL/min (by C-G formula based on SCr of 0.92 mg/dL). Liver Function Tests:  Recent Labs Lab 09/07/16 1300 09/08/16 0603  AST 42* 40  ALT 53 53  ALKPHOS 51 44  BILITOT 0.5 0.5  PROT 5.7* 5.3*  ALBUMIN 2.9* 2.6*   No results for input(s): LIPASE, AMYLASE in the last 168 hours. No results for input(s): AMMONIA in the last 168 hours. Coagulation Profile: No results for input(s): INR, PROTIME in the last 168 hours. Cardiac Enzymes: No results for input(s): CKTOTAL, CKMB, CKMBINDEX, TROPONINI in the last 168 hours. BNP (last 3 results) No results for input(s): PROBNP in the last 8760 hours. HbA1C:  Recent Labs  09/07/16 1647  HGBA1C 6.8*    CBG:  Recent Labs Lab 09/07/16 1948 09/08/16 0250 09/08/16 0857 09/08/16 1140  GLUCAP 160* 143* 124* 189*   Lipid Profile: No results for input(s): CHOL, HDL, LDLCALC, TRIG, CHOLHDL, LDLDIRECT in the last 72 hours. Thyroid Function Tests: No results for input(s): TSH, T4TOTAL, FREET4, T3FREE, THYROIDAB in the last 72 hours. Anemia Panel: No results for input(s): VITAMINB12, FOLATE, FERRITIN, TIBC, IRON, RETICCTPCT in the last 72 hours.  Sepsis Labs:  Recent Labs Lab 09/07/16 1646 09/07/16 1647  PROCALCITON <0.10  --   LATICACIDVEN  --  0.8    Recent Results (from the past 240 hour(s))  MRSA PCR Screening     Status: None   Collection Time: 09/01/16  8:58 PM  Result Value Ref Range Status   MRSA by PCR NEGATIVE NEGATIVE Final    Comment:        The GeneXpert MRSA Assay (FDA approved for NASAL specimens only), is one component of a comprehensive MRSA colonization surveillance program. It is not intended to diagnose MRSA infection nor to guide or monitor treatment for MRSA infections.          Radiology Studies: Ct Head Wo Contrast  Result Date: 09/07/2016 CLINICAL DATA:  Altered mental status.  Increased lethargy. EXAM: CT HEAD WITHOUT CONTRAST TECHNIQUE: Contiguous axial images were obtained from the base of the skull through the vertex without intravenous contrast. COMPARISON:  CT head without contrast 09/02/2016 and 09/01/2016. FINDINGS: Brain: There is expected evolution of left subdural and subarachnoid hemorrhage. The size of the subdural component has not changed significantly. There is mixed density compatible with evolution of blood products. Additional subarachnoid and subdural blood layers along the tentorium, left greater than right. Intraventricular hemorrhage is more prominent than on the prior exam. Minimal left to right midline shift is present. There is some local mass effect on the left with crowding of the sulci and partial effacement of the  left lateral ventricle. No definite new areas of hemorrhage are present. There is slight dilation of the right lateral ventricle without hydrocephalus. Vascular: Extensive atherosclerotic calcifications are present within the cavernous internal carotid arteries. There is no hyperdense vessel. Skull: The calvarium is intact. Sinuses/Orbits: The paranasal sinuses and mastoid air cells are clear. IMPRESSION: 1. Evolving subdural and subarachnoid blood products, left greater than right. 2. Increased prominence of intraventricular hemorrhage, likely reflecting layering blood from the previous  hemorrhage. 3. No definite acute hemorrhage. 4. Left-sided mass effect without significant midline shift. Electronically Signed   By: San Morelle M.D.   On: 09/07/2016 13:13   Dg Chest Port 1 View  Result Date: 09/07/2016 CLINICAL DATA:  Fever.  Acute mental status change. EXAM: PORTABLE CHEST 1 VIEW COMPARISON:  January 15, 2007 FINDINGS: The heart, hila, and mediastinum are unchanged. No pneumothorax. Mild atelectasis in the left base. A small left effusion is not excluded. IMPRESSION: Mild atelectasis in the left base. A small left effusion is not excluded. Electronically Signed   By: Dorise Bullion III M.D   On: 09/07/2016 14:22        Scheduled Meds: . ceFEPime (MAXIPIME) IV  1 g Intravenous Q24H  . insulin aspart  0-9 Units Subcutaneous Q4H  . levETIRAcetam  500 mg Oral BID  . sodium chloride flush  3 mL Intravenous Q12H  . vancomycin  1,000 mg Intravenous Q24H   Continuous Infusions: . 0.9 % NaCl with KCl 20 mEq / L 125 mL/hr (09/08/16 0920)     LOS: 1 day      [facility], MD [facility] Pager [phone]  If 7PM-7AM, please contact night-coverage [email or web address] Password [email or web address] 09/08/2016, 1:43 PM

## 2016-09-08 NOTE — Progress Notes (Signed)
EEG Completed; Results Pending  

## 2016-09-08 NOTE — Progress Notes (Signed)
Patient able to take 4 sips of water through a straw without difficulty during swallowing and without cough or moist voice afterwards.

## 2016-09-08 NOTE — Progress Notes (Signed)
Patient's son, "Kenneth Stark", stated he thinks his father is having a headache.  Patient unable to state if he was in pain or not.  Acetaminophen given.

## 2016-09-08 NOTE — Consult Note (Signed)
Reason for Consult: Encephalopathy close head injury Referring Physician: [facility]  AMADOU KATZENSTEIN is an 82 y.o. male.  HPI: Patient is a 82 year old gentleman who is admitted last week after a fall and was diagnosed with a subdural ulcer max of brachial hemorrhage and patient was observed for couple days was transferred back to [facility] was doing well up through Tuesday but then started experiencing a decline on Wednesday and patient came back to the ER last night with altered mental status were fine difficulties and confusion. The wife reports he significantly improved today.  Past Medical History:  Diagnosis Date  . Abnormality of gait   . Arthritis   . Cervical spondylosis   . Degenerative joint disease (DJD) of lumbar spine   . Diplopia   . Dyslipidemia   . Essential tremor   . Hearing difficulty    hearing aids  . Hypertension   . 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 Small vessel disease     Past Surgical History:  Procedure Laterality Date  . KNEE ARTHROSCOPY Left    Dr. Hart Robinsons 2002  . knee injections Right    Dr. Adriana Mccallum    Family History  Problem Relation Age of Onset  . Stroke Mother   . Stroke Father   . Heart disease Father   . Dementia Brother   . Renal Disease Brother   . Renal Disease Daughter     Social History:  reports that he has never smoked. He has never used smokeless tobacco. He reports that he drinks alcohol. He reports that he does not use drugs.  Allergies: No Known Allergies  Medications: I have reviewed the patient's current medications.  Results for orders placed or performed during the hospital encounter of 09/07/16 (from the past 48 hour(s))  Comprehensive metabolic panel     Status: Abnormal   Collection Time: 09/07/16  1:00 PM  Result Value Ref Range   Sodium 134 (L) 135 - 145 mmol/L   Potassium 2.7 (LL) 3.5 - 5.1 mmol/L    Comment: CRITICAL RESULT CALLED TO, READ BACK BY AND  VERIFIED WITH: N.SIMMONS,RN 09/07/16 _0  BY V.WILKINS    Chloride 98 (L) 101 - 111 mmol/L   CO2 26 22 - 32 mmol/L   Glucose, Bld 221 (H) 65 - 99 mg/dL   BUN 29 (H) 6 - 20 mg/dL   Creatinine, Ser 1.14 0.61 - 1.24 mg/dL   Calcium 9.7 8.9 - 10.3 mg/dL   Total Protein 5.7 (L) 6.5 - 8.1 g/dL   Albumin 2.9 (L) 3.5 - 5.0 g/dL   AST 42 (H) 15 - 41 U/L   ALT 53 17 - 63 U/L   Alkaline Phosphatase 51 38 - 126 U/L   Total Bilirubin 0.5 0.3 - 1.2 mg/dL   GFR calc non Af Amer 55 (L) >60 mL/min   GFR calc Af Amer >60 >60 mL/min    Comment: (NOTE) The eGFR has been calculated using the CKD EPI equation. This calculation has not been validated in all clinical situations. eGFR's persistently <60 mL/min signify possible Chronic Kidney Disease.    Anion gap 10 5 - 15  CBC with Differential     Status: Abnormal   Collection Time: 09/07/16  1:00 PM  Result Value Ref Range   WBC 12.1 (H) 4.0 - 10.5 K/uL   RBC 3.02 (L) 4.22 - 5.81 MIL/uL   Hemoglobin 9.4 (L) 13.0 - 17.0  g/dL   HCT 27.4 (L) 39.0 - 52.0 %   MCV 90.7 78.0 - 100.0 fL   MCH 31.1 26.0 - 34.0 pg   MCHC 34.3 30.0 - 36.0 g/dL   RDW 13.8 11.5 - 15.5 %   Platelets 255 150 - 400 K/uL   Neutrophils Relative % 73 %   Neutro Abs 8.9 (H) 1.7 - 7.7 K/uL   Lymphocytes Relative 15 %   Lymphs Abs 1.9 0.7 - 4.0 K/uL   Monocytes Relative 11 %   Monocytes Absolute 1.3 (H) 0.1 - 1.0 K/uL   Eosinophils Relative 1 %   Eosinophils Absolute 0.1 0.0 - 0.7 K/uL   Basophils Relative 0 %   Basophils Absolute 0.0 0.0 - 0.1 K/uL  Urinalysis, Routine w reflex microscopic     Status: None   Collection Time: 09/07/16  1:50 PM  Result Value Ref Range   Color, Urine YELLOW YELLOW   APPearance CLEAR CLEAR   Specific Gravity, Urine 1.021 1.005 - 1.030   pH 5.5 5.0 - 8.0   Glucose, UA NEGATIVE NEGATIVE mg/dL   Hgb urine dipstick NEGATIVE NEGATIVE   Bilirubin Urine NEGATIVE NEGATIVE   Ketones, ur NEGATIVE NEGATIVE mg/dL   Protein, ur NEGATIVE NEGATIVE mg/dL    Nitrite NEGATIVE NEGATIVE   Leukocytes, UA NEGATIVE NEGATIVE    Comment: MICROSCOPIC NOT DONE ON URINES WITH NEGATIVE PROTEIN, BLOOD, LEUKOCYTES, NITRITE, OR GLUCOSE <1000 mg/dL.  Magnesium     Status: None   Collection Time: 09/07/16  2:02 PM  Result Value Ref Range   Magnesium 1.8 1.7 - 2.4 mg/dL  Influenza panel by PCR (type A & B, H1N1)     Status: None   Collection Time: 09/07/16  4:30 PM  Result Value Ref Range   Influenza A By PCR NEGATIVE NEGATIVE   Influenza B By PCR NEGATIVE NEGATIVE    Comment: (NOTE) The Xpert Xpress Flu assay is intended as an aid in the diagnosis of  influenza and should not be used as a sole basis for treatment.  This  assay is FDA approved for nasopharyngeal swab specimens only. Nasal  washings and aspirates are unacceptable for Xpert Xpress Flu testing.   Procalcitonin - Baseline     Status: None   Collection Time: 09/07/16  4:46 PM  Result Value Ref Range   Procalcitonin <0.10 ng/mL    Comment:        Interpretation: PCT (Procalcitonin) <= 0.5 ng/mL: Systemic infection (sepsis) is not likely. Local bacterial infection is possible. (NOTE)         ICU PCT Algorithm               Non ICU PCT Algorithm    ----------------------------     ------------------------------         PCT < 0.25 ng/mL                 PCT < 0.1 ng/mL     Stopping of antibiotics            Stopping of antibiotics       strongly encouraged.               strongly encouraged.    ----------------------------     ------------------------------       PCT level decrease by               PCT < 0.25 ng/mL       >= 80% from peak PCT  OR PCT 0.25 - 0.5 ng/mL          Stopping of antibiotics                                             encouraged.     Stopping of antibiotics           encouraged.    ----------------------------     ------------------------------       PCT level decrease by              PCT >= 0.25 ng/mL       < 80% from peak PCT        AND PCT >= 0.5 ng/mL             Continuin g antibiotics                                              encouraged.       Continuing antibiotics            encouraged.    ----------------------------     ------------------------------     PCT level increase compared          PCT > 0.5 ng/mL         with peak PCT AND          PCT >= 0.5 ng/mL             Escalation of antibiotics                                          strongly encouraged.      Escalation of antibiotics        strongly encouraged.   Lactic acid, plasma     Status: None   Collection Time: 09/07/16  4:47 PM  Result Value Ref Range   Lactic Acid, Venous 0.8 0.5 - 1.9 mmol/L  Phosphorus     Status: None   Collection Time: 09/07/16  6:44 PM  Result Value Ref Range   Phosphorus 3.2 2.5 - 4.6 mg/dL  Glucose, capillary     Status: Abnormal   Collection Time: 09/07/16  7:48 PM  Result Value Ref Range   Glucose-Capillary 160 (H) 65 - 99 mg/dL   Comment 1 Notify RN   Glucose, capillary     Status: Abnormal   Collection Time: 09/08/16  2:50 AM  Result Value Ref Range   Glucose-Capillary 143 (H) 65 - 99 mg/dL   Comment 1 Notify RN   Comprehensive metabolic panel     Status: Abnormal   Collection Time: 09/08/16  6:03 AM  Result Value Ref Range   Sodium 139 135 - 145 mmol/L   Potassium 3.2 (L) 3.5 - 5.1 mmol/L   Chloride 104 101 - 111 mmol/L   CO2 26 22 - 32 mmol/L   Glucose, Bld 140 (H) 65 - 99 mg/dL   BUN 20 6 - 20 mg/dL   Creatinine, Ser 0.92 0.61 - 1.24 mg/dL   Calcium 9.0 8.9 - 10.3 mg/dL   Total Protein 5.3 (L) 6.5 - 8.1 g/dL   Albumin 2.6 (L) 3.5 - 5.0 g/dL  AST 40 15 - 41 U/L   ALT 53 17 - 63 U/L   Alkaline Phosphatase 44 38 - 126 U/L   Total Bilirubin 0.5 0.3 - 1.2 mg/dL   GFR calc non Af Amer >60 >60 mL/min   GFR calc Af Amer >60 >60 mL/min    Comment: (NOTE) The eGFR has been calculated using the CKD EPI equation. This calculation has not been validated in all clinical situations. eGFR's persistently <60 mL/min signify  possible Chronic Kidney Disease.    Anion gap 9 5 - 15  CBC     Status: Abnormal   Collection Time: 09/08/16  6:03 AM  Result Value Ref Range   WBC 11.2 (H) 4.0 - 10.5 K/uL   RBC 2.96 (L) 4.22 - 5.81 MIL/uL   Hemoglobin 9.1 (L) 13.0 - 17.0 g/dL   HCT 27.0 (L) 39.0 - 52.0 %   MCV 91.2 78.0 - 100.0 fL   MCH 30.7 26.0 - 34.0 pg   MCHC 33.7 30.0 - 36.0 g/dL   RDW 14.1 11.5 - 15.5 %   Platelets 235 150 - 400 K/uL  Glucose, capillary     Status: Abnormal   Collection Time: 09/08/16  8:57 AM  Result Value Ref Range   Glucose-Capillary 124 (H) 65 - 99 mg/dL   Comment 1 Notify RN     Ct Head Wo Contrast  Result Date: 09/07/2016 CLINICAL DATA:  Altered mental status.  Increased lethargy. EXAM: CT HEAD WITHOUT CONTRAST TECHNIQUE: Contiguous axial images were obtained from the base of the skull through the vertex without intravenous contrast. COMPARISON:  CT head without contrast 09/02/2016 and 09/01/2016. FINDINGS: Brain: There is expected evolution of left subdural and subarachnoid hemorrhage. The size of the subdural component has not changed significantly. There is mixed density compatible with evolution of blood products. Additional subarachnoid and subdural blood layers along the tentorium, left greater than right. Intraventricular hemorrhage is more prominent than on the prior exam. Minimal left to right midline shift is present. There is some local mass effect on the left with crowding of the sulci and partial effacement of the left lateral ventricle. No definite new areas of hemorrhage are present. There is slight dilation of the right lateral ventricle without hydrocephalus. Vascular: Extensive atherosclerotic calcifications are present within the cavernous internal carotid arteries. There is no hyperdense vessel. Skull: The calvarium is intact. Sinuses/Orbits: The paranasal sinuses and mastoid air cells are clear. IMPRESSION: 1. Evolving subdural and subarachnoid blood products, left greater  than right. 2. Increased prominence of intraventricular hemorrhage, likely reflecting layering blood from the previous hemorrhage. 3. No definite acute hemorrhage. 4. Left-sided mass effect without significant midline shift. Electronically Signed   By: San Morelle M.D.   On: 09/07/2016 13:13   Dg Chest Port 1 View  Result Date: 09/07/2016 CLINICAL DATA:  Fever.  Acute mental status change. EXAM: PORTABLE CHEST 1 VIEW COMPARISON:  January 15, 2007 FINDINGS: The heart, hila, and mediastinum are unchanged. No pneumothorax. Mild atelectasis in the left base. A small left effusion is not excluded. IMPRESSION: Mild atelectasis in the left base. A small left effusion is not excluded. Electronically Signed   By: Dorise Bullion III M.D   On: 09/07/2016 14:22    Review of Systems  Unable to perform ROS: Mental status change   Blood pressure (!) 151/61, pulse 67, temperature 98.3 F (36.8 C), temperature source Oral, resp. rate 16, height _0  (1.727 m), weight 82.1 kg (181 lb), SpO2 97 %.  Physical Exam  Neurological: He is alert. He is disoriented. GCS eye subscore is 4. GCS verbal subscore is 5. GCS motor subscore is 6.  She is awake and alert pupils are equal X occlusor intact cranial nerves appear to be otherwise intact strength is 5 of 5 upper and lower extremities with no pronator drift. She certainly is confused rambling perseverating about various things unrelated to his current problem and this is displaying some word finding difficulties consistent with a Broca"s dysphasia    Assessment/Plan: 82 year old gentleman with resolving subdural fluid collection resolving and applicable and subarachnoid hemorrhage a little bit more settling in the occipital horns lateral ventricles. No significant mass effect. Certainly mental status changes and some word finding difficulty may represent some local swelling or possibly even seizure activity on the left side of his brain side and I think that as  long as there is no confrontation with start the patient on low-dose steroid at 4 mg 3 times a day of Decadron and also probably put him on Keppra 500 mg twice a day.  Broughton Eppinger P 09/08/2016, 10:40 AM

## 2016-09-08 NOTE — Progress Notes (Signed)
Patient's wife in room.  She stated her husband is "more alert now than yesterday".  She also stated that her husband had some memory problems and 2 - 3 years ago was started on meds, with a significant improvement until the fall and brain bleed.  Since the head injury he has had restlessness, confusion, and inability to sleep, which increased this week on Wednesday and Thursday.  The RN at the assisted living recommended transfer to the hospital for evaluation to determine if that situation was "stable".    She is under the impression that his orientation and difficulty expressing himself along with the restlessness, is about the same as earlier this week.

## 2016-09-08 NOTE — Progress Notes (Signed)
Patient's wife stated the patient's mother, brother, and maternal aunt had Alzheimer's dementia.  She also stated the patient has had difficulty sleeping since Sunday (last weekend) except for "sleeping well on Monday".  Dr. Algis Liming updated on all information.

## 2016-09-09 LAB — BASIC METABOLIC PANEL
ANION GAP: 9 (ref 5–15)
BUN: 21 mg/dL — AB (ref 6–20)
CALCIUM: 8.8 mg/dL — AB (ref 8.9–10.3)
CO2: 24 mmol/L (ref 22–32)
Chloride: 104 mmol/L (ref 101–111)
Creatinine, Ser: 0.97 mg/dL (ref 0.61–1.24)
GFR calc Af Amer: >60 mL/min (ref >60)
GLUCOSE: 191 mg/dL — AB (ref 65–99)
POTASSIUM: 3.9 mmol/L (ref 3.5–5.1)
Sodium: 137 mmol/L (ref 135–145)

## 2016-09-09 LAB — CBC
HEMATOCRIT: 29.1 % — AB (ref 39.0–52.0)
Hemoglobin: 9.9 g/dL — ABNORMAL LOW (ref 13.0–17.0)
MCH: 30.7 pg (ref 26.0–34.0)
MCHC: 34 g/dL (ref 30.0–36.0)
MCV: 90.4 fL (ref 78.0–100.0)
PLATELETS: 297 10*3/uL (ref 150–400)
RBC: 3.22 MIL/uL — ABNORMAL LOW (ref 4.22–5.81)
RDW: 13.7 % (ref 11.5–15.5)
WBC: 10.9 10*3/uL — AB (ref 4.0–10.5)

## 2016-09-09 LAB — GLUCOSE, CAPILLARY
GLUCOSE-CAPILLARY: 214 mg/dL — AB (ref 65–99)
GLUCOSE-CAPILLARY: 186 mg/dL — AB (ref 65–99)
GLUCOSE-CAPILLARY: 238 mg/dL — AB (ref 65–99)
Glucose-Capillary: 191 mg/dL — ABNORMAL HIGH (ref 65–99)
Glucose-Capillary: 234 mg/dL — ABNORMAL HIGH (ref 65–99)
Glucose-Capillary: 246 mg/dL — ABNORMAL HIGH (ref 65–99)
Glucose-Capillary: 273 mg/dL — ABNORMAL HIGH (ref 65–99)

## 2016-09-09 LAB — PROCALCITONIN: Procalcitonin: <0.1 ng/mL

## 2016-09-09 LAB — AMMONIA: AMMONIA: 19 umol/L (ref 9–35)

## 2016-09-09 MED ORDER — MIRTAZAPINE 15 MG PO TABS: 7.5000 mg | ORAL_TABLET | Freq: Every evening | ORAL | Status: DC | PRN

## 2016-09-09 MED ORDER — MIRTAZAPINE 15 MG PO TABS
7.5000 mg | ORAL_TABLET | Freq: Every day | ORAL | Status: DC
  Administered 2016-09-09 – 2016-09-14 (×6): 7.5 mg via ORAL
  Filled 2016-09-09 (×5): qty 1

## 2016-09-09 MED ORDER — POTASSIUM CHLORIDE IN NACL 20-0.9 MEQ/L-% IV SOLN
INTRAVENOUS | Status: DC
  Administered 2016-09-10 – 2016-09-11 (×2): via INTRAVENOUS
  Filled 2016-09-09 (×2): qty 1000

## 2016-09-09 MED ORDER — AMLODIPINE BESYLATE 5 MG PO TABS
5.0000 mg | ORAL_TABLET | Freq: Every day | ORAL | Status: DC
  Administered 2016-09-09 – 2016-09-15 (×7): 5 mg via ORAL
  Filled 2016-09-09 (×7): qty 1

## 2016-09-09 MED ORDER — INSULIN GLARGINE 100 UNIT/ML ~~LOC~~ SOLN
5.0000 [IU] | SUBCUTANEOUS | Status: DC
  Administered 2016-09-09: 5 [IU] via SUBCUTANEOUS
  Filled 2016-09-09 (×2): qty 0.05

## 2016-09-09 NOTE — Progress Notes (Addendum)
PROGRESS NOTE  Kenneth Stark  [id number] DOB: [birth year]  DOA: 09/07/2016 PCP: Geoffery Lyons, MD   Brief Narrative:  82 year old male with PMH of CVA (Plavix held during recent admission), TIA, degenerative joint disease, HTN, essential tremor, arthritis, HLD, hospitalized 09/01/16-09/03/16 for left subdural hematoma/SAH and left ear laceration status post fall and head trauma. Neurosurgery had consulted and managed conservatively. Left ear laceration was sutured. He was discharged to [facility]. He seemed to gradually improve over the first couple of days with improving motor skills, clarity of speech and perception. However 2 days prior to this admission, he did not sleep well at night, progressively became confused, restless and was brought back to ED for concerns of worsening subdural hematoma. CT head in the ED did not show worsening. This was confirmed by discussing with neurosurgery. Patient admitted for further management. Neurosurgery consulted.   Assessment & Plan:   Principal Problem:   Altered mental status Active Problems:   Subdural hematoma (HCC)   Essential hypertension   Dyslipidemia   Essential tremor   History of TIA (transient ischemic attack)   Subarachnoid hemorrhage (HCC)   Acute hyperglycemia   Dehydration   Transaminitis   Anemia   Leukocytosis   Acute respiratory failure with hypoxia (HCC)   1.  Acute encephalopathy: Exact etiology not clear. However this may be multifactorial related to recent head trauma and subdural hematoma/SAH, disruption in sleep cycle, complicating underlying cognitive impairment/dementia, low index of suspicion for infection in the absence of signs and symptoms (antibiotics discontinued 11/24) or seizures. Neurosurgery input appreciated. SDH/SAH resolving and stable without significant mass effect. Started patient on Keppra 500 MG twice a day and low-dose steroids. EEG-results pending. Holding Aricept, Lexapro, Remeron  for now. No significant change in mental status compared to yesterday. Discussed with RN and patient's spouse to attempt to maintain sleep hygiene (keep blinds up during the day, mobilize as able, decreased stimulation/interruption overnight). Monitor closely and if no significant improvement by tomorrow then consider further evaluation/? neurology consultation. 2. Recent subdural hematoma/subarachnoid hemorrhage: Discussion as per problem #1. Stable or improving. Neurosurgery input appreciated. 3. Hypokalemia: replaced. Magnesium 1.8. 4. Anemia: Stable. Follow CBC periodically.. 5. Type II DM: Hemoglobin A1c: 6.8. SSI. Monitor closely while he is on steroids. This is a new diagnosis for him. Added low-dose Lantus. 6. Leukocytosis: Mild, 12.1. Could be stress response. Low index of suspicion for infection. Urine microscopy unremarkable. Chest x-ray shows left basilar atelectasis. Empirically started IV cefepime and vancomycin were discontinued. Lactate 0.8. Pro-calcitonin <0.10. Repeat chest x-ray shows bibasilar atelectasis. Blood cultures 2: Negative to date. 7. Acute respiratory failure with hypoxia:? Related to atelectasis. Continue oxygen supplementation. Incentive spirometry as able. Wean off of oxygen as tolerated. 8. Essential hypertension: Mildly uncontrolled. HCTZ, and ARB temporarily held. Continue when necessary IV hydralazine. Resumed amlodipine at 5 MG daily. 9. Dehydration: Improved. Due to inconsistent and poor oral intake, continue gentle IV fluids. 10. Dyslipidemia: Continue home medications i.e. statins and omega-3 fatty acids. 11. Essential tremor: None noticed today. 12. History of TIA: Antiplatelets currently on hold due to recent SDH/SAH. Decision to be made regarding resumption. 13. History of dementia: Acute medical illness including recent head injury and bleed probably complicating situation. 14.  Status post recent fall, head injury and left flank bruising 15. Status post  repair of left ear laceration: Supposed to follow up with ENT for suture removal early next week. Please call Dr.Teoh, ENT on 11/27 so that he can remove sutures in the  hospital. 16. Insomnia: Start Remeron 7.5 MG at bedtime (patient was on 30 mg at bedtime PTA for depression). DC Ambien.  DVT prophylaxis: SCDs Code Status: Full Family Communication: Discussed in detail with spouse at bedside Disposition Plan: DC back to SNF when medically improved. Not medically stable for discharge.   Consultants:   Neurosurgery  Procedures:   None  Antimicrobials:   Cefepime 11/23> 11/24  Vancomycin 11/23> 11/24   Subjective: Patient unable to provide history secondary to altered mental status. History obtained from RN and spouse at bedside. He apparently did not sleep much at all last night and was a fidgety. Received Ativan at midnight without significant response. Somnolent earlier this morning but more alert now and fidgety-keeps pulling at telemetry, she eats, talking incomprehensibly.  Objective:  Vitals:   09/08/16 1953 09/08/16 2319 09/09/16 0337 09/09/16 0700  BP: (!) 142/66 (!) 123/94 (!) 149/79 (!) 153/62  Pulse: 72 75 77 73  Resp: (!) 23 15  (!) 23  Temp: 98.2 F (36.8 C) 98.6 F (37 C) 98.5 F (36.9 C) 97.9 F (36.6 C)  TempSrc: Oral Oral Oral Oral  SpO2: 95% 96% 97% 98%  Weight:   85.1 kg (187 lb 9.8 oz)   Height:        Intake/Output Summary (Last 24 hours) at 09/09/16 1125 Last data filed at 09/09/16 0700  Gross per 24 hour  Intake           953.33 ml  Output              925 ml  Net            28.33 ml   Filed Weights   09/07/16 1819 09/08/16 0251 09/09/16 0337  Weight: 83.1 kg (183 lb 3.2 oz) 82.1 kg (181 lb) 85.1 kg (187 lb 9.8 oz)    Examination:  General exam: Pleasant elderly male lying comfortably propped up in bed.  Respiratory system: poor inspiratory effort. Diminished breath sounds in the bases but otherwise clear to auscultation. Respiratory  effort normal. Cardiovascular system: S1 & S2 heard, RRR. No JVD, murmurs, rubs, gallops or clicks. No pedal edema. . Telemetry: Sinus rhythm with BBB morphology. Gastrointestinal system: Abdomen is nondistended, soft and nontender. No organomegaly or masses felt. Normal bowel sounds heard. Condom cath  Central nervous system: Slightly somnolent but easily arousable this morning, and not oriented. Follows some simple instructions. No focal deficits.  Extremities: Symmetric 5 x 5 power. Skin: areas of extensive bruising over left lower flank, left hip and over left ear-all of these appear subacute. Please see pictures below.  Psychiatry:  unable to assess secondary to mental status changes.        Data Reviewed: I have personally reviewed following labs and imaging studies  CBC:  Recent Labs Lab 09/03/16 0235 09/04/16 0124 09/07/16 1300 09/08/16 0603 09/09/16 0548  WBC 12.7* 9.9 12.1* 11.2* 10.9*  NEUTROABS  --   --  8.9*  --   --   HGB 10.7* 10.1* 9.4* 9.1* 9.9*  HCT 31.8* 29* 27.4* 27.0* 29.1*  MCV 91.9  --  90.7 91.2 90.4  PLT 216 231 255 235 [id number]   Basic Metabolic Panel:  Recent Labs Lab 09/03/16 0235 09/03/16 1230 09/04/16 0124 09/07/16 1300 09/07/16 1402 09/07/16 1844 09/08/16 0603 09/09/16 0548  NA 138 138 141 134*  --   --  139 137  K 2.8* 3.3* 3.6 2.7*  --   --  3.2* 3.9  CL 103 103  --  98*  --   --  104 104  CO2 26 27  --  26  --   --  26 24  GLUCOSE 195* 234*  --  221*  --   --  140* 191*  BUN 19 19 22* 29*  --   --  20 21*  CREATININE 1.01 0.89 0.9 1.14  --   --  0.92 0.97  CALCIUM 9.2 8.9  --  9.7  --   --  9.0 8.8*  MG 1.9  --   --   --  1.8  --   --   --   PHOS  --   --   --   --   --  3.2  --   --    GFR: Estimated Creatinine Clearance: 55.9 mL/min (by C-G formula based on SCr of 0.97 mg/dL). Liver Function Tests:  Recent Labs Lab 09/07/16 1300 09/08/16 0603  AST 42* 40  ALT 53 53  ALKPHOS 51 44  BILITOT 0.5 0.5  PROT 5.7* 5.3*    ALBUMIN 2.9* 2.6*   No results for input(s): LIPASE, AMYLASE in the last 168 hours. No results for input(s): AMMONIA in the last 168 hours. Coagulation Profile: No results for input(s): INR, PROTIME in the last 168 hours. Cardiac Enzymes: No results for input(s): CKTOTAL, CKMB, CKMBINDEX, TROPONINI in the last 168 hours. BNP (last 3 results) No results for input(s): PROBNP in the last 8760 hours. HbA1C:  Recent Labs  09/07/16 1647  HGBA1C 6.8*   CBG:  Recent Labs Lab 09/08/16 1956 09/08/16 2318 09/09/16 0339 09/09/16 0747 09/09/16 1107  GLUCAP 256* 214* 191* 186* 234*   Lipid Profile: No results for input(s): CHOL, HDL, LDLCALC, TRIG, CHOLHDL, LDLDIRECT in the last 72 hours. Thyroid Function Tests: No results for input(s): TSH, T4TOTAL, FREET4, T3FREE, THYROIDAB in the last 72 hours. Anemia Panel: No results for input(s): VITAMINB12, FOLATE, FERRITIN, TIBC, IRON, RETICCTPCT in the last 72 hours.  Sepsis Labs:  Recent Labs Lab 09/07/16 1646 09/07/16 1647 09/09/16 0548  PROCALCITON <0.10  --  <0.10  LATICACIDVEN  --  0.8  --     Recent Results (from the past 240 hour(s))  MRSA PCR Screening     Status: None   Collection Time: 09/01/16  8:58 PM  Result Value Ref Range Status   MRSA by PCR NEGATIVE NEGATIVE Final    Comment:        The GeneXpert MRSA Assay (FDA approved for NASAL specimens only), is one component of a comprehensive MRSA colonization surveillance program. It is not intended to diagnose MRSA infection nor to guide or monitor treatment for MRSA infections.   Culture, blood (Routine X 2) w Reflex to ID Panel     Status: None (Preliminary result)   Collection Time: 09/07/16  1:00 PM  Result Value Ref Range Status   Specimen Description BLOOD RIGHT ANTECUBITAL  Final   Special Requests BOTTLES DRAWN AEROBIC AND ANAEROBIC 5CC  Final   Culture NO GROWTH < 24 HOURS  Final   Report Status PENDING  Incomplete  Culture, blood (Routine X 2) w  Reflex to ID Panel     Status: None (Preliminary result)   Collection Time: 09/07/16  4:45 PM  Result Value Ref Range Status   Specimen Description BLOOD LEFT ANTECUBITAL  Final   Special Requests BOTTLES DRAWN AEROBIC AND ANAEROBIC 5CC  Final   Culture NO GROWTH < 24 HOURS  Final   Report Status PENDING  Incomplete  Radiology Studies: Ct Head Wo Contrast  Result Date: 09/07/2016 CLINICAL DATA:  Altered mental status.  Increased lethargy. EXAM: CT HEAD WITHOUT CONTRAST TECHNIQUE: Contiguous axial images were obtained from the base of the skull through the vertex without intravenous contrast. COMPARISON:  CT head without contrast 09/02/2016 and 09/01/2016. FINDINGS: Brain: There is expected evolution of left subdural and subarachnoid hemorrhage. The size of the subdural component has not changed significantly. There is mixed density compatible with evolution of blood products. Additional subarachnoid and subdural blood layers along the tentorium, left greater than right. Intraventricular hemorrhage is more prominent than on the prior exam. Minimal left to right midline shift is present. There is some local mass effect on the left with crowding of the sulci and partial effacement of the left lateral ventricle. No definite new areas of hemorrhage are present. There is slight dilation of the right lateral ventricle without hydrocephalus. Vascular: Extensive atherosclerotic calcifications are present within the cavernous internal carotid arteries. There is no hyperdense vessel. Skull: The calvarium is intact. Sinuses/Orbits: The paranasal sinuses and mastoid air cells are clear. IMPRESSION: 1. Evolving subdural and subarachnoid blood products, left greater than right. 2. Increased prominence of intraventricular hemorrhage, likely reflecting layering blood from the previous hemorrhage. 3. No definite acute hemorrhage. 4. Left-sided mass effect without significant midline shift. Electronically  Signed   By: San Morelle M.D.   On: 09/07/2016 13:13   Dg Chest Port 1 View  Result Date: 09/08/2016 CLINICAL DATA:  Hypoxia, history hypertension, small vessel disease, essential tremor EXAM: PORTABLE CHEST 1 VIEW COMPARISON:  Portable exam 1440 hours compared to 09/07/2016 FINDINGS: Upper normal size of cardiac silhouette. Mediastinal contours and pulmonary vascularity normal. Atherosclerotic calcifications aorta. Bibasilar atelectasis. Lungs otherwise clear. No pleural effusion or pneumothorax. Old healed RIGHT rib fractures noted. IMPRESSION: Bibasilar atelectasis. Electronically Signed   By: Lavonia Dana M.D.   On: 09/08/2016 14:47   Dg Chest Port 1 View  Result Date: 09/07/2016 CLINICAL DATA:  Fever.  Acute mental status change. EXAM: PORTABLE CHEST 1 VIEW COMPARISON:  January 15, 2007 FINDINGS: The heart, hila, and mediastinum are unchanged. No pneumothorax. Mild atelectasis in the left base. A small left effusion is not excluded. IMPRESSION: Mild atelectasis in the left base. A small left effusion is not excluded. Electronically Signed   By: Dorise Bullion III M.D   On: 09/07/2016 14:22        Scheduled Meds: . dexamethasone  4 mg Oral Q8H  . insulin aspart  0-5 Units Subcutaneous QHS  . insulin aspart  0-9 Units Subcutaneous TID WC  . levETIRAcetam  500 mg Oral BID  . pravastatin  20 mg Oral q1800  . sodium chloride flush  3 mL Intravenous Q12H  . tamsulosin  0.4 mg Oral Daily  . triamcinolone  2 spray Nasal QHS   Continuous Infusions:    LOS: 2 days      [facility], MD [facility] Pager [phone]  If 7PM-7AM, please contact night-coverage [email or web address] Password [facility] 09/09/2016, 11:25 AM

## 2016-09-09 NOTE — Evaluation (Signed)
Physical Therapy Evaluation Patient Details Name: Kenneth Stark MRN: [id number] DOB: [birth year] Today's Date: 09/09/2016   History of Present Illness  Kenneth Mongiello Collinsis a 82 y.o.malewith medical history significant for TIAs on Plavix, hypertension, anemia, dyslipidemia and essential tremor. Patient was recently discharged to [facility] on 11/19 after an admission for subdural hematoma/subarachnoid hemorrhage after a fall while taking Plavix.   Now, pt presents with altered mentation of unclear etiology.  Clinical Impression  Pt admitted with/for AMS.  Pt is at a 2 person assist level at mod to max assist.  Pt currently limited functionally due to the problems listed. ( See problems list.)   Pt will benefit from PT to maximize function and safety in order to get ready for next venue listed below.     Follow Up Recommendations SNF    Equipment Recommendations       Recommendations for Other Services       Precautions / Restrictions Precautions Precautions: Fall Restrictions Weight Bearing Restrictions: No      Mobility  Bed Mobility Overal bed mobility: Needs Assistance Bed Mobility: Supine to Sit     Supine to sit: Mod assist;HOB elevated     General bed mobility comments: cues to help initiate, truncal assist and support to give pt time to react.  Transfers Overall transfer level: Needs assistance Equipment used: None;1 person hand held assist;2 person hand held assist;Rolling walker (2 wheeled) Transfers: Sit to/from [facility] Sit to Stand: Mod assist;Max assist;+2 physical assistance Stand pivot transfers: Mod assist;Max assist;+2 physical assistance       General transfer comment: variable assist needs based on how fearful pt is at the time.  Ambulation/Gait             General Gait Details: not appropriate today  Stairs            Wheelchair Mobility    Modified Rankin (Stroke Patients  Only)       Balance Overall balance assessment: Needs assistance Sitting-balance support: Single extremity supported;Bilateral upper extremity supported Sitting balance-Leahy Scale: Poor (poor in general, fair at best when pt finally relaxes)     Standing balance support: Bilateral upper extremity supported Standing balance-Leahy Scale: Poor Standing balance comment: reliant on external support                             Pertinent Vitals/Pain Pain Assessment: Faces Faces Pain Scale: Hurts a little bit Pain Location: vague Pain Descriptors / Indicators: Grimacing Pain Intervention(s): Monitored during session;Repositioned    Home Living Family/patient expects to be discharged to:: Private residence Living Arrangements: Spouse/significant other Available Help at Discharge: Family Type of Home: House Home Access: Level entry     Home Layout: One level Home Equipment: Environmental consultant - 2 wheels;Cane - single point      Prior Function Level of Independence: Independent with assistive device(s)         Comments: Pt uses [facility] PTA, drives. helps care for wife. Reports some falls at home.     Hand Dominance        Extremity/Trunk Assessment   Upper Extremity Assessment: Defer to OT evaluation           Lower Extremity Assessment: Generalized weakness         Communication   Communication: HOH  Cognition Arousal/Alertness: Awake/alert Behavior During Therapy: WFL for tasks assessed/performed Overall Cognitive Status: Impaired/Different from baseline Area of Impairment: Safety/judgement;Problem  solving;Orientation;Attention;Following commands;Awareness Orientation Level: Disoriented to;Place;Time;Situation Current Attention Level: Focused Memory: Decreased short-term memory Following Commands: Follows one step commands with increased time;Follows one step commands inconsistently Safety/Judgement: Decreased awareness of safety;Decreased awareness of  deficits Awareness: Intellectual Problem Solving: Slow processing      General Comments General comments (skin integrity, edema, etc.): vitals WFL and stable    Exercises     Assessment/Plan    PT Assessment Patient needs continued PT services  PT Problem List Decreased strength;Decreased activity tolerance;Decreased balance;Decreased mobility;Decreased coordination          PT Treatment Interventions DME instruction;Therapeutic activities;Gait training;Therapeutic exercise;Patient/family education;Balance training;Functional mobility training    PT Goals (Current goals can be found in the Care Plan section)  Acute Rehab PT Goals Patient Stated Goal: pt unable PT Goal Formulation: Patient unable to participate in goal setting Time For Goal Achievement: 09/23/16 Potential to Achieve Goals: Fair    Frequency Min 3X/week   Barriers to discharge        Co-evaluation               End of Session   Activity Tolerance: Patient limited by fatigue;Other (comment) (limited by mentation and fears also) Patient left: in chair;with call bell/phone within reach;with chair alarm set;with family/visitor present Nurse Communication: Mobility status         Time: [id number] PT Time Calculation (min) (ACUTE ONLY): 49 min   Charges:   PT Evaluation $PT Eval Moderate Complexity: 1 Procedure PT Treatments $Therapeutic Activity: 23-37 mins   PT G CodesTessie Fass Slayden Stark 09/09/2016, 5:02 PM 09/09/2016  Kenneth Stark, [place] [phone]  (pager)

## 2016-09-10 ENCOUNTER — Inpatient Hospital Stay (HOSPITAL_COMMUNITY): Payer: Medicare Other

## 2016-09-10 LAB — BLOOD GAS, ARTERIAL
Acid-base deficit: 1.5 mmol/L (ref 0.0–2.0)
Bicarbonate: 21.5 mmol/L (ref 20.0–28.0)
Drawn by: 33176
FIO2: 21
O2 Saturation: 93.8 %
PCO2 ART: 28 mmHg — AB (ref 32.0–48.0)
PH ART: 7.494 — AB (ref 7.350–7.450)
PO2 ART: 68.2 mmHg — AB (ref 83.0–108.0)
Patient temperature: 97.7

## 2016-09-10 LAB — GLUCOSE, CAPILLARY
GLUCOSE-CAPILLARY: 203 mg/dL — AB (ref 65–99)
GLUCOSE-CAPILLARY: 221 mg/dL — AB (ref 65–99)
GLUCOSE-CAPILLARY: 228 mg/dL — AB (ref 65–99)
Glucose-Capillary: 226 mg/dL — ABNORMAL HIGH (ref 65–99)

## 2016-09-10 MED ORDER — INSULIN GLARGINE 100 UNIT/ML ~~LOC~~ SOLN
10.0000 [IU] | Freq: Every day | SUBCUTANEOUS | Status: DC
  Administered 2016-09-10: 10 [IU] via SUBCUTANEOUS
  Filled 2016-09-10 (×2): qty 0.1

## 2016-09-10 NOTE — Procedures (Signed)
History: 82 year old male with history of subdural hematoma presented with altered mental status and cognitive decline   Technique: This is a 21 channel 2 hour scalp video-EEG performed at the bedside with bipolar and monopolar montages arranged in accordance to the international 10/20 system of electrode placement. One channel was dedicated to EKG recording.    Description of the recording Posterior background is slow between 6-8 Hz symmetrical EEG comprises of low amplitude generalized delta and theta slowing Sleep was not obtained Epileptiform features were not seen during this recording  Impression EEG is abnormal and findings are consistent with generalized nonspecific cerebral dysfunction. Epileptiform features were not seen during this recording

## 2016-09-10 NOTE — Clinical Social Work Note (Signed)
Clinical Social Work Assessment  Patient Details  Name: Kenneth Stark MRN: [id number] Date of Birth: September 23, 1925  Date of referral:  09/10/16               Reason for consult:  Discharge Planning                Permission sought to share information with:  Family Supports, Facility Sport and exercise psychologist, Case Manager Permission granted to share information::  Yes, Verbal Permission Granted  Name::      Lorin Glass )  Agency::   ([facility] )  Relationship::   (Spouse )  Contact Information:   [phone])  Housing/Transportation Living arrangements for the past 2 months:  [place] of Information:  Spouse Patient Interpreter Needed:  None Criminal Activity/Legal Involvement Pertinent to Current Situation/Hospitalization:  No - Comment as needed Significant Relationships:  Adult Children, Spouse Lives with:  Spouse Do you feel safe going back to the place where you live?  Yes Need for family participation in patient care:  Yes (Comment)  Care giving concerns:  Patient from [facility], however was discharged on Sunday, 11/19 to [facility] for STR and readmitted to [facility] on Thurs, 11/23.    Social Worker assessment / plan: MSW spoke with patient's wife, [name] in reference to pt's return to SNF, [facility] at discharge. Patient's wife is agreeable to patient's return and is currently awaiting neurosurgery follow up. No further concerns reported at this time. MSW will continue to follow pt and pt's family for continued support and to facilitate pt's discharge needs once stable.   Employment status:  Retired Nurse, adult PT Recommendations:  [place] / Referral to community resources:  [place]  Patient/Family's Response to care: Patient disoriented.Patient's wife agreeable to return to SNF. Patient's wife supportive and strongly involved in pt's care. Pt's wife  appreciated call from sw.   Patient/Family's Understanding of and Emotional Response to Diagnosis, Current Treatment, and Prognosis:  Wife knowledgeable of medical/surgical interventions.   Emotional Assessment Appearance:  Appears stated age Attitude/Demeanor/Rapport:  Unable to Assess Affect (typically observed):  Unable to Assess Orientation:  Oriented to Self Alcohol / Substance use:  Not Applicable Psych involvement (Current and /or in the community):  No (Comment)  Discharge Needs  Concerns to be addressed:  Denies Needs/Concerns at this time Readmission within the last 30 days:  No Current discharge risk:  None Barriers to Discharge:  Continued Medical Work up   Glendon Axe A 09/10/2016, 11:16 AM

## 2016-09-10 NOTE — Progress Notes (Signed)
Patient ID: Kenneth Stark, male   DOB: [birth year], 82 y.o.   MRN: [id number] Subjective:  The patient is somnolent but easily arousable. He is in no apparent distress.  Objective: Vital signs in last 24 hours: Temp:  [98.1 F (36.7 C)-98.3 F (36.8 C)] 98.3 F (36.8 C) (11/26 0350) Pulse Rate:  [63-69] 63 (11/26 0800) Resp:  [16-27] 16 (11/26 0800) BP: (123-143)/(51-86) 130/65 (11/26 0350) SpO2:  [94 %-98 %] 94 % (11/26 0800) Weight:  [85.8 kg (189 lb 2.5 oz)] 85.8 kg (189 lb 2.5 oz) (11/26 0350)  Intake/Output from previous day: 11/25 0701 - 11/26 0700 In: 835 [I.V.:835] Out: 450 [Urine:450] Intake/Output this shift: No intake/output data recorded.  Physical exam the patient is Glasgow Coma Scale 12,  E3M6V3. He follows commands and moves all 4 extremities well. He is confused and mumbles.  Lab Results:  Recent Labs  09/08/16 0603 09/09/16 0548  WBC 11.2* 10.9*  HGB 9.1* 9.9*  HCT 27.0* 29.1*  PLT 235 297   BMET  Recent Labs  09/08/16 0603 09/09/16 0548  NA 139 137  K 3.2* 3.9  CL 104 104  CO2 26 24  GLUCOSE 140* 191*  BUN 20 21*  CREATININE 0.92 0.97  CALCIUM 9.0 8.8*    Studies/Results: Dg Chest Port 1 View  Result Date: 09/08/2016 CLINICAL DATA:  Hypoxia, history hypertension, small vessel disease, essential tremor EXAM: PORTABLE CHEST 1 VIEW COMPARISON:  Portable exam 1440 hours compared to 09/07/2016 FINDINGS: Upper normal size of cardiac silhouette. Mediastinal contours and pulmonary vascularity normal. Atherosclerotic calcifications aorta. Bibasilar atelectasis. Lungs otherwise clear. No pleural effusion or pneumothorax. Old healed RIGHT rib fractures noted. IMPRESSION: Bibasilar atelectasis. Electronically Signed   By: Lavonia Dana M.D.   On: 09/08/2016 14:47    Assessment/Plan: Small left subdural hematoma: The patient's neurologic exam is nonfocal. I suspect his confusion is multifactorial and likely secondary to his age, being hospitalized,  etc. We will plan to repeat his head CT to make sure that his subdural hematoma has not significantly enlarged. The last scan demonstrated a small subdural hematoma without significant mass effect.  LOS: 3 days     Annalise Mcdiarmid D 09/10/2016, 9:10 AM

## 2016-09-10 NOTE — NC FL2 (Signed)
[place] LEVEL OF CARE SCREENING TOOL     IDENTIFICATION  Patient Name: Kenneth Stark Birthdate: 10/27/1924 Sex: male Admission Date (Current Location): 09/07/2016  [facility] and [place] Number:  Herbalist and Address:  [facility], [place] [address], [place], [place] [address]      Provider Number: [id number]  Attending Physician Name and Address:  Modena Jansky, MD  Relative Name and Phone Number:       Current Level of Care: Hospital Recommended Level of Care: [place] Prior Approval Number:    Date Approved/Denied:   PASRR Number:  [id number]  Discharge Plan: SNF    Current Diagnoses: Patient Active Problem List   Diagnosis Date Noted  . Altered mental status 09/07/2016  . Subarachnoid hemorrhage ([place]) 09/07/2016  . Acute hyperglycemia 09/07/2016  . Dehydration 09/07/2016  . Transaminitis 09/07/2016  . Anemia 09/07/2016  . Leukocytosis 09/07/2016  . Acute respiratory failure with hypoxia ([place]) 09/07/2016  . Subdural hematoma ([place]) 09/01/2016  . Complex laceration of left ear 09/01/2016  . Cerebrovascular disease 09/01/2016  . History of TIA (transient ischemic attack) 09/01/2016  . Essential hypertension   . Sixth nerve palsy   . Cervical spondylosis   . Degenerative joint disease (DJD) of lumbar spine   . Arthritis   . Dyslipidemia   . Essential tremor   . Abnormality of gait 11/27/2013    Orientation RESPIRATION BLADDER Height & Weight     Self, Place  Normal Incontinent, External catheter Weight: 189 lb 2.5 oz (85.8 kg) Height:  5\' 8"  (172.7 cm)  BEHAVIORAL SYMPTOMS/MOOD NEUROLOGICAL BOWEL NUTRITION STATUS   (none )  (none ) Continent Diet (Heart Healthy/Carb Modified )  AMBULATORY STATUS COMMUNICATION OF NEEDS Skin   Extensive Assist Verbally Skin abrasions (L. Knee Laceration )                       Personal Care Assistance Level of Assistance  Bathing, Feeding,  Dressing Bathing Assistance: Limited assistance Feeding assistance: Limited assistance Dressing Assistance: Limited assistance     Functional Limitations Info  Speech, Hearing, Sight Sight Info: Adequate Hearing Info: Impaired Speech Info: Adequate    SPECIAL CARE FACTORS FREQUENCY  PT (By licensed PT)     PT Frequency: 3              Contractures      Additional Factors Info  Code Status, Allergies Code Status Info: FULL CODE  Allergies Info: N/A           Current Medications (09/10/2016):  This is the current hospital active medication list Current Facility-Administered Medications  Medication Dose Route Frequency Provider Last Rate Last Dose  . 0.9 % NaCl with KCl 20 mEq/ L  infusion   Intravenous Continuous Modena Jansky, MD 50 mL/hr at 09/09/16 2000    . acetaminophen (TYLENOL) tablet 650 mg  650 mg Oral Q6H PRN Samella Parr, NP   650 mg at 09/08/16 1417   Or  . acetaminophen (TYLENOL) suppository 650 mg  650 mg Rectal Q6H PRN Samella Parr, NP      . amLODipine (NORVASC) tablet 5 mg  5 mg Oral Daily Modena Jansky, MD   5 mg at 09/10/16 0857  . dexamethasone (DECADRON) tablet 4 mg  4 mg Oral Q8H Modena Jansky, MD   4 mg at 09/10/16 0631  . hydrALAZINE (APRESOLINE) injection 10 mg  10  mg Intravenous Q6H PRN Samella Parr, NP      . insulin aspart (novoLOG) injection 0-5 Units  0-5 Units Subcutaneous QHS Modena Jansky, MD   2 Units at 09/09/16 2158  . insulin aspart (novoLOG) injection 0-9 Units  0-9 Units Subcutaneous TID WC Modena Jansky, MD   3 Units at 09/10/16 [phone]  . insulin glargine (LANTUS) injection 10 Units  10 Units Subcutaneous Daily Modena Jansky, MD   10 Units at 09/10/16 [phone]  . levETIRAcetam (KEPPRA) tablet 500 mg  500 mg Oral BID Kary Kos, MD   500 mg at 09/10/16 0857  . mirtazapine (REMERON) tablet 7.5 mg  7.5 mg Oral QHS Modena Jansky, MD   7.5 mg at 09/09/16 2157  . mirtazapine (REMERON) tablet 7.5 mg  7.5 mg Oral QHS  PRN Modena Jansky, MD      . polyvinyl alcohol (LIQUIFILM TEARS) 1.4 % ophthalmic solution 1 drop  1 drop Both Eyes PRN Modena Jansky, MD      . pravastatin (PRAVACHOL) tablet 20 mg  20 mg Oral q1800 Modena Jansky, MD   20 mg at 09/09/16 1503  . sodium chloride flush (NS) 0.9 % injection 3 mL  3 mL Intravenous Q12H Samella Parr, NP   3 mL at 09/09/16 2158  . tamsulosin (FLOMAX) capsule 0.4 mg  0.4 mg Oral Daily Modena Jansky, MD   0.4 mg at 09/10/16 0857  . triamcinolone (NASACORT) nasal inhaler 2 spray  2 spray Nasal QHS Modena Jansky, MD   2 spray at 09/09/16 2159     Discharge Medications: Please see discharge summary for a list of discharge medications.  Relevant Imaging Results:  Relevant Lab Results:   Additional Information SSN [id number]  Glendon Axe A

## 2016-09-10 NOTE — Progress Notes (Addendum)
PROGRESS NOTE  CAMMRON ALLSOPP  [id number] DOB: [birth year]  DOA: 09/07/2016 PCP: Geoffery Lyons, MD   Brief Narrative:  82 year old male with PMH of CVA (Plavix held during recent admission), TIA, degenerative joint disease, HTN, essential tremor, arthritis, HLD, hospitalized 09/01/16-09/03/16 for left subdural hematoma/SAH and left ear laceration status post fall and head trauma. Neurosurgery had consulted and managed conservatively. Left ear laceration was sutured. He was discharged to [facility]. He seemed to gradually improve over the first couple of days with improving motor skills, clarity of speech and perception. However 2 days prior to this admission, he did not sleep well at night, progressively became confused, restless and was brought back to ED for concerns of worsening subdural hematoma. CT head in the ED did not show worsening. This was confirmed by discussing with neurosurgery. Patient admitted for further management. Neurosurgery consulted. Patient continues to have no significant improvement in mental status. Repeat head CT 11/26: Stable. Ongoing mental status changes likely secondary to traumatic brain injury,SDH/SAH complicating underlying dementia. If no significant improvement in the next day or so, consider palliative care consultation for goals of care.   Assessment & Plan:   Principal Problem:   Altered mental status Active Problems:   Subdural hematoma (HCC)   Essential hypertension   Dyslipidemia   Essential tremor   History of TIA (transient ischemic attack)   Subarachnoid hemorrhage (HCC)   Acute hyperglycemia   Dehydration   Transaminitis   Anemia   Leukocytosis   Acute respiratory failure with hypoxia (HCC)   1.  Acute encephalopathy: Exact etiology not clear. However this may be multifactorial related to recent head trauma and subdural hematoma/SAH, disruption in sleep cycle, complicating underlying cognitive impairment/dementia, low index of  suspicion for infection in the absence of signs and symptoms (antibiotics discontinued 11/24) or seizures. Neurosurgery input appreciated. SDH/SAH resolving and stable without significant mass effect. Started patient on Keppra 500 MG twice a day and low-dose steroids. EEG-results pending. Holding Aricept, Lexapro for now. Maintain sleep hygiene (keep blinds up during the day, mobilize as able, decreased stimulation/interruption overnight). No significant improvement in mental status since admission. Discussed with neurosurgery on call 11/26, repeated CT head which remained stable without new changes & recommended discontinuing steroids since it has not helped and likely to cause complications and no need seen to do MRI. Agree with neurosurgery that this may be related to traumatic brain injury, SDH/SAH complicating underlying dementia with likelihood of residual deficits. If patient does not make improvement over the next 1 or 2 days, consider palliative care consultation for goals of care. Check ABG to rule out CO2 retention (seems less likely). 2. Recent subdural hematoma/subarachnoid hemorrhage: Discussion as per problem #1. Stable or improving. Neurosurgery input appreciated. 3. Hypokalemia: replaced. Magnesium 1.8. 4. Anemia: Stable. Follow CBC periodically.. 5. Type II DM: Hemoglobin A1c: 6.8. SSI. Monitor closely while he is on steroids. This is a new diagnosis for him. Hyperglycemia with CBGs in the 200s. Added and increased Lantus to 10 units daily. Monitor closely. Since steroids discontinued on 11/26, likely that his CBGs will also improve and we will have to adjust/decrease Lantus as appropriate. 6. Leukocytosis: Mild, 12.1. Could be stress response. Low index of suspicion for infection. Urine microscopy unremarkable. Chest x-ray shows left basilar atelectasis. Empirically started IV cefepime and vancomycin were discontinued. Lactate 0.8. Pro-calcitonin <0.10. Repeat chest x-ray shows bibasilar  atelectasis. Blood cultures 2: Negative to date. 7. Acute respiratory failure with hypoxia:? Related to atelectasis. Continue oxygen supplementation.  Incentive spirometry as able. Wean off of oxygen as tolerated. 8. Essential hypertension: Mildly uncontrolled. HCTZ, and ARB temporarily held. Continue when necessary IV hydralazine. Resumed amlodipine at 5 MG daily. Controlled. 9. Dehydration: Improved. Due to inconsistent and poor oral intake, continue gentle IV fluids. 10. Dyslipidemia: Continue home medications i.e. statins and omega-3 fatty acids. 11. Essential tremor: None noticed today. 12. History of TIA: Antiplatelets currently on hold due to recent SDH/SAH. Decision to be made regarding resumption. 13. History of dementia: Acute medical illness including recent head injury and bleed probably complicating situation. Please see discussion above. 14.  Status post recent fall, head injury and left flank bruising 15. Status post repair of left ear laceration: Supposed to follow up with ENT for suture removal early next week. Please call Dr.Teoh, ENT on 11/27 so that he can remove sutures in the hospital. 16. Insomnia: Start Remeron 7.5 MG at bedtime (patient was on 30 mg at bedtime PTA for depression). DC Ambien. Despite Remeron last night, apparently did not sleep well and was reaching out in the air trying to catch things ? Hallucinations.  DVT prophylaxis: SCDs Code Status: Full Family Communication: None at bedside today. Disposition Plan: Not medically stable for discharge. To be determined.   Consultants:   Neurosurgery  Procedures:   None  Antimicrobials:   Cefepime 11/23> 11/24  Vancomycin 11/23> 11/24   Subjective: Patient seen this morning. Somnolent but easily arousable. Not oriented and does not follow instructions. As per nursing team, did not sleep well last night, Reaching out in the air like he was trying to catch something, possibly  hallucinations.  Objective:  Vitals:   09/09/16 1958 09/10/16 0007 09/10/16 0350 09/10/16 0800  BP: (!) 143/63 123/86 130/65 108/74  Pulse: 69 69 65 63  Resp: 19 20 (!) 27 16  Temp: 98.3 F (36.8 C) 98.1 F (36.7 C) 98.3 F (36.8 C) 97.7 F (36.5 C)  TempSrc: Oral Axillary Oral Axillary  SpO2: 97% 96% 98% 94%  Weight:   85.8 kg (189 lb 2.5 oz)   Height:        Intake/Output Summary (Last 24 hours) at 09/10/16 1111 Last data filed at 09/10/16 0900  Gross per 24 hour  Intake             1035 ml  Output              450 ml  Net              585 ml   Filed Weights   09/08/16 0251 09/09/16 0337 09/10/16 0350  Weight: 82.1 kg (181 lb) 85.1 kg (187 lb 9.8 oz) 85.8 kg (189 lb 2.5 oz)    Examination:  General exam: Pleasant elderly male lying comfortably propped up in bed.  Respiratory system: poor inspiratory effort. Diminished breath sounds in the bases but otherwise clear to auscultation. Respiratory effort normal. Cardiovascular system: S1 & S2 heard, RRR. No JVD, murmurs, rubs, gallops or clicks. No pedal edema. . Telemetry: Sinus rhythm with BBB morphology.18 beat nonsustained SVT on 11/26 at 1:40 AM. Gastrointestinal system: Abdomen is nondistended, soft and nontender. No organomegaly or masses felt. Normal bowel sounds heard. Condom cath  Central nervous system: Slightly somnolent but easily arousable this morning, and not oriented. Does not follow instructions. No focal deficits.  Extremities: Moving all limbs symmetrically. Skin: areas of extensive bruising over left lower flank, left hip and over left ear-all of these appear subacute. Please see pictures below.  Psychiatry:  unable to assess secondary to mental status changes.        Data Reviewed: I have personally reviewed following labs and imaging studies  CBC:  Recent Labs Lab 09/04/16 0124 09/07/16 1300 09/08/16 0603 09/09/16 0548  WBC 9.9 12.1* 11.2* 10.9*  NEUTROABS  --  8.9*  --   --   HGB 10.1*  9.4* 9.1* 9.9*  HCT 29* 27.4* 27.0* 29.1*  MCV  --  90.7 91.2 90.4  PLT 231 255 235 [id number]   Basic Metabolic Panel:  Recent Labs Lab 09/03/16 1230 09/04/16 0124 09/07/16 1300 09/07/16 1402 09/07/16 1844 09/08/16 0603 09/09/16 0548  NA 138 141 134*  --   --  139 137  K 3.3* 3.6 2.7*  --   --  3.2* 3.9  CL 103  --  98*  --   --  104 104  CO2 27  --  26  --   --  26 24  GLUCOSE 234*  --  221*  --   --  140* 191*  BUN 19 22* 29*  --   --  20 21*  CREATININE 0.89 0.9 1.14  --   --  0.92 0.97  CALCIUM 8.9  --  9.7  --   --  9.0 8.8*  MG  --   --   --  1.8  --   --   --   PHOS  --   --   --   --  3.2  --   --    GFR: Estimated Creatinine Clearance: 56.1 mL/min (by C-G formula based on SCr of 0.97 mg/dL). Liver Function Tests:  Recent Labs Lab 09/07/16 1300 09/08/16 0603  AST 42* 40  ALT 53 53  ALKPHOS 51 44  BILITOT 0.5 0.5  PROT 5.7* 5.3*  ALBUMIN 2.9* 2.6*   No results for input(s): LIPASE, AMYLASE in the last 168 hours.  Recent Labs Lab 09/09/16 1223  AMMONIA 19   Coagulation Profile: No results for input(s): INR, PROTIME in the last 168 hours. Cardiac Enzymes: No results for input(s): CKTOTAL, CKMB, CKMBINDEX, TROPONINI in the last 168 hours. BNP (last 3 results) No results for input(s): PROBNP in the last 8760 hours. HbA1C:  Recent Labs  09/07/16 1647  HGBA1C 6.8*   CBG:  Recent Labs Lab 09/09/16 1107 09/09/16 1740 09/09/16 1954 09/09/16 2145 09/10/16 0749  GLUCAP 234* 246* 273* 238* 203*   Lipid Profile: No results for input(s): CHOL, HDL, LDLCALC, TRIG, CHOLHDL, LDLDIRECT in the last 72 hours. Thyroid Function Tests: No results for input(s): TSH, T4TOTAL, FREET4, T3FREE, THYROIDAB in the last 72 hours. Anemia Panel: No results for input(s): VITAMINB12, FOLATE, FERRITIN, TIBC, IRON, RETICCTPCT in the last 72 hours.  Sepsis Labs:  Recent Labs Lab 09/07/16 1646 09/07/16 1647 09/09/16 0548  PROCALCITON <0.10  --  <0.10  LATICACIDVEN  --   0.8  --     Recent Results (from the past 240 hour(s))  MRSA PCR Screening     Status: None   Collection Time: 09/01/16  8:58 PM  Result Value Ref Range Status   MRSA by PCR NEGATIVE NEGATIVE Final    Comment:        The GeneXpert MRSA Assay (FDA approved for NASAL specimens only), is one component of a comprehensive MRSA colonization surveillance program. It is not intended to diagnose MRSA infection nor to guide or monitor treatment for MRSA infections.   Culture, blood (Routine X 2) w Reflex to ID Panel  Status: None (Preliminary result)   Collection Time: 09/07/16  1:00 PM  Result Value Ref Range Status   Specimen Description BLOOD RIGHT ANTECUBITAL  Final   Special Requests BOTTLES DRAWN AEROBIC AND ANAEROBIC 5CC  Final   Culture NO GROWTH 2 DAYS  Final   Report Status PENDING  Incomplete  Culture, blood (Routine X 2) w Reflex to ID Panel     Status: None (Preliminary result)   Collection Time: 09/07/16  4:45 PM  Result Value Ref Range Status   Specimen Description BLOOD LEFT ANTECUBITAL  Final   Special Requests BOTTLES DRAWN AEROBIC AND ANAEROBIC 5CC  Final   Culture NO GROWTH 2 DAYS  Final   Report Status PENDING  Incomplete         Radiology Studies: Ct Head Wo Contrast  Result Date: 09/10/2016 CLINICAL DATA:  82 year old male with intracranial hemorrhage after a fall earlier this month. Initial encounter. EXAM: CT HEAD WITHOUT CONTRAST TECHNIQUE: Contiguous axial images were obtained from the base of the skull through the vertex without intravenous contrast. COMPARISON:  Head CT without contrast 09/07/2016 and earlier. FINDINGS: Brain: Mixed density right subdural hematoma appears stable ranging in thickness from 6-8 mm along much of the left convexity. Small volume hyperdense blood layering on the left tentorium is stable. Suspicion of superimposed left frontal operculum hemorrhagic contusion on series 4, image 40, stable. Low-density right subdural hematoma  or hygroma measures 5 mm in thickness and is stable since 09/07/2016. Trace residual subarachnoid hemorrhage along the left sylvian fissure and posterior right convexity. Small volume layering intraventricular hemorrhage in the occipital horns is stable. No ventriculomegaly. No midline shift. Stable gray-white matter differentiation throughout the brain. No cortically based acute infarct identified. Vascular: Calcified atherosclerosis at the skull base. Skull: No acute osseous abnormality identified. Sinuses/Orbits: Visualized paranasal sinuses and mastoids are stable and well pneumatized. Other: Mild generalized scalp soft tissue edema. Negative orbits soft tissues. IMPRESSION: 1. Mixed density left and low-density right subdural hematomas are stable since 09/07/2016. No midline shift. 2. Stable small volume residual subarachnoid hemorrhage. Possible small associated left frontal operculum hemorrhagic contusion which is stable. 3. Stable small volume intraventricular hemorrhage with no ventriculomegaly. 4. No new intracranial abnormality. Electronically Signed   By: Genevie Ann M.D.   On: 09/10/2016 10:52   Dg Chest Port 1 View  Result Date: 09/08/2016 CLINICAL DATA:  Hypoxia, history hypertension, small vessel disease, essential tremor EXAM: PORTABLE CHEST 1 VIEW COMPARISON:  Portable exam 1440 hours compared to 09/07/2016 FINDINGS: Upper normal size of cardiac silhouette. Mediastinal contours and pulmonary vascularity normal. Atherosclerotic calcifications aorta. Bibasilar atelectasis. Lungs otherwise clear. No pleural effusion or pneumothorax. Old healed RIGHT rib fractures noted. IMPRESSION: Bibasilar atelectasis. Electronically Signed   By: Lavonia Dana M.D.   On: 09/08/2016 14:47        Scheduled Meds: . amLODipine  5 mg Oral Daily  . dexamethasone  4 mg Oral Q8H  . insulin aspart  0-5 Units Subcutaneous QHS  . insulin aspart  0-9 Units Subcutaneous TID WC  . insulin glargine  10 Units  Subcutaneous Daily  . levETIRAcetam  500 mg Oral BID  . mirtazapine  7.5 mg Oral QHS  . pravastatin  20 mg Oral q1800  . sodium chloride flush  3 mL Intravenous Q12H  . tamsulosin  0.4 mg Oral Daily  . triamcinolone  2 spray Nasal QHS   Continuous Infusions: . 0.9 % NaCl with KCl 20 mEq / L 50 mL/hr at 09/09/16  2000     LOS: 3 days      [facility], MD [facility] Pager [phone]  If 7PM-7AM, please contact night-coverage [email or web address] Password [email or web address] 09/10/2016, 11:11 AM

## 2016-09-11 LAB — PROCALCITONIN

## 2016-09-11 LAB — GLUCOSE, CAPILLARY
GLUCOSE-CAPILLARY: 129 mg/dL — AB (ref 65–99)
Glucose-Capillary: 157 mg/dL — ABNORMAL HIGH (ref 65–99)
Glucose-Capillary: 158 mg/dL — ABNORMAL HIGH (ref 65–99)

## 2016-09-11 MED ORDER — LACTATED RINGERS IV SOLN
INTRAVENOUS | Status: DC
  Administered 2016-09-11 – 2016-09-12 (×2): via INTRAVENOUS

## 2016-09-11 NOTE — Care Management Important Message (Signed)
Important Message  Patient Details  Name: Kenneth Stark MRN: [id number] Date of Birth: June 04, 1925   Medicare Important Message Given:  Yes    Jariana Shumard Abena 09/11/2016, 11:42 AM

## 2016-09-11 NOTE — Progress Notes (Signed)
PROGRESS NOTE    Kenneth FISCHETTI  [id number] DOB: 12-Nov-1924 DOA: 09/07/2016 PCP: Geoffery Lyons, MD     Brief Narrative:  82 year old male with PMH of CVA, TIA, degenerative joint disease, HTN, essential tremor, arthritis, HLD, hospitalized 09/01/16-09/03/16 for left subdural hematoma/SAH and left ear laceration status post fall and head trauma. Neurosurgery had consulted and managed conservatively. 2 days prior to this admission, he did not sleep well at night, progressively became confused, restless and was brought back to ED for concerns of worsening subdural hematoma. Neurosurgery evaluation with no worsening subdural. Persistent encephalopathy with delirium features.  Assessment & Plan:   Principal Problem:   Altered mental status Active Problems:   Subdural hematoma (HCC)   Essential hypertension   Dyslipidemia   Essential tremor   History of TIA (transient ischemic attack)   Subarachnoid hemorrhage (HCC)   Acute hyperglycemia   Dehydration   Transaminitis   Anemia   Leukocytosis   Acute respiratory failure with hypoxia (HCC)  1. Metabolic encephalopathy with delirium. Will continue neuro checks, patient follows commands, but not able to communicate with words. Will continue supportive care with IV fluids. Continue keppra and mirtazapine. Physical therapy evaluation and out of bed as tolerated. On remeron at night  2. Hypokalemia. Improved K at 3.9. Na at 137, will continue saline with k supplements.  3. Anemia. Hb at 9.9 stable with no signs of bleeding.   4. T2DM. Capillary glucose 228-129-157.   5. Acute hypoxic respiratory failure. Resolved, oxymetry at 97% on room air, will continue oxymetry monitoring aspiration precautions.   6. Subdural hematoma (chronic). No signs of worsening per imaging, will continue neuro checks and physical therapy evaluation.   7. HTN. Blood pressure 140 to 180. Clinically hemodynamically stable and euvolemic. Will continue  amlodipine.   DVT prophylaxis: scd  Code Status: Full  Family Communication: No family at the bedside  Disposition Plan: SNF   Consultants:   neurosurgery  Procedures:    Antimicrobials:       Subjective: Patient confused, not able to sleep last night until 4 am. No chest pain, no dyspnea, no nausea or vomiting.   Objective: Vitals:   09/10/16 1925 09/10/16 2241 09/11/16 0307 09/11/16 0756  BP: 139/64 (!) 150/65 (!) 152/64 (!) 182/71  Pulse: 61 63 63 (!) 59  Resp: 20 (!) 23 (!) 21 14  Temp: 97.7 F (36.5 C) 97.7 F (36.5 C) 98 F (36.7 C) 98.1 F (36.7 C)  TempSrc: Oral Oral Oral Oral  SpO2: 98% 98% 100% 99%  Weight:   87 kg (191 lb 12.8 oz)   Height:        Intake/Output Summary (Last 24 hours) at 09/11/16 0815 Last data filed at 09/11/16 0756  Gross per 24 hour  Intake              500 ml  Output             1150 ml  Net             -650 ml   Filed Weights   09/09/16 0337 09/10/16 0350 09/11/16 0307  Weight: 85.1 kg (187 lb 9.8 oz) 85.8 kg (189 lb 2.5 oz) 87 kg (191 lb 12.8 oz)    Examination:  General exam: deconditioned, not in pain or dyspnea E ENT. Mild pallor but no icterus, oral mucosa dry.  Respiratory system: decreased breath sounds at bases, no wheezing or rales. Respiratory effort normal. Cardiovascular system: S1 & S2 heard, RRR.  No JVD, murmurs, rubs, gallops or clicks. No pedal edema. Gastrointestinal system: Abdomen is nondistended, soft and nontender. No organomegaly or masses felt. Normal bowel sounds heard. Central nervous system: awake, follows commands, disorientated to place and time. Non focal.  Extremities: Symmetric 5 x 5 power. Skin: No rashes, lesions or ulcers    Data Reviewed: I have personally reviewed following labs and imaging studies  CBC:  Recent Labs Lab 09/07/16 1300 09/08/16 0603 09/09/16 0548  WBC 12.1* 11.2* 10.9*  NEUTROABS 8.9*  --   --   HGB 9.4* 9.1* 9.9*  HCT 27.4* 27.0* 29.1*  MCV 90.7 91.2 90.4    PLT 255 235 [id number]   Basic Metabolic Panel:  Recent Labs Lab 09/07/16 1300 09/07/16 1402 09/07/16 1844 09/08/16 0603 09/09/16 0548  NA 134*  --   --  139 137  K 2.7*  --   --  3.2* 3.9  CL 98*  --   --  104 104  CO2 26  --   --  26 24  GLUCOSE 221*  --   --  140* 191*  BUN 29*  --   --  20 21*  CREATININE 1.14  --   --  0.92 0.97  CALCIUM 9.7  --   --  9.0 8.8*  MG  --  1.8  --   --   --   PHOS  --   --  3.2  --   --    GFR: Estimated Creatinine Clearance: 56.4 mL/min (by C-G formula based on SCr of 0.97 mg/dL). Liver Function Tests:  Recent Labs Lab 09/07/16 1300 09/08/16 0603  AST 42* 40  ALT 53 53  ALKPHOS 51 44  BILITOT 0.5 0.5  PROT 5.7* 5.3*  ALBUMIN 2.9* 2.6*   No results for input(s): LIPASE, AMYLASE in the last 168 hours.  Recent Labs Lab 09/09/16 1223  AMMONIA 19   Coagulation Profile: No results for input(s): INR, PROTIME in the last 168 hours. Cardiac Enzymes: No results for input(s): CKTOTAL, CKMB, CKMBINDEX, TROPONINI in the last 168 hours. BNP (last 3 results) No results for input(s): PROBNP in the last 8760 hours. HbA1C: No results for input(s): HGBA1C in the last 72 hours. CBG:  Recent Labs Lab 09/09/16 2145 09/10/16 0749 09/10/16 1213 09/10/16 1723 09/10/16 2102  GLUCAP 238* 203* 226* 221* 228*   Lipid Profile: No results for input(s): CHOL, HDL, LDLCALC, TRIG, CHOLHDL, LDLDIRECT in the last 72 hours. Thyroid Function Tests: No results for input(s): TSH, T4TOTAL, FREET4, T3FREE, THYROIDAB in the last 72 hours. Anemia Panel: No results for input(s): VITAMINB12, FOLATE, FERRITIN, TIBC, IRON, RETICCTPCT in the last 72 hours. Sepsis Labs:  Recent Labs Lab 09/07/16 1646 09/07/16 1647 09/09/16 0548 09/11/16 0523  PROCALCITON <0.10  --  <0.10 <0.10  LATICACIDVEN  --  0.8  --   --     Recent Results (from the past 240 hour(s))  MRSA PCR Screening     Status: None   Collection Time: 09/01/16  8:58 PM  Result Value Ref Range  Status   MRSA by PCR NEGATIVE NEGATIVE Final    Comment:        The GeneXpert MRSA Assay (FDA approved for NASAL specimens only), is one component of a comprehensive MRSA colonization surveillance program. It is not intended to diagnose MRSA infection nor to guide or monitor treatment for MRSA infections.   Culture, blood (Routine X 2) w Reflex to ID Panel     Status: None (Preliminary  result)   Collection Time: 09/07/16  1:00 PM  Result Value Ref Range Status   Specimen Description BLOOD RIGHT ANTECUBITAL  Final   Special Requests BOTTLES DRAWN AEROBIC AND ANAEROBIC 5CC  Final   Culture NO GROWTH 3 DAYS  Final   Report Status PENDING  Incomplete  Culture, blood (Routine X 2) w Reflex to ID Panel     Status: None (Preliminary result)   Collection Time: 09/07/16  4:45 PM  Result Value Ref Range Status   Specimen Description BLOOD LEFT ANTECUBITAL  Final   Special Requests BOTTLES DRAWN AEROBIC AND ANAEROBIC 5CC  Final   Culture NO GROWTH 3 DAYS  Final   Report Status PENDING  Incomplete         Radiology Studies: Ct Head Wo Contrast  Result Date: 09/10/2016 CLINICAL DATA:  82 year old male with intracranial hemorrhage after a fall earlier this month. Initial encounter. EXAM: CT HEAD WITHOUT CONTRAST TECHNIQUE: Contiguous axial images were obtained from the base of the skull through the vertex without intravenous contrast. COMPARISON:  Head CT without contrast 09/07/2016 and earlier. FINDINGS: Brain: Mixed density right subdural hematoma appears stable ranging in thickness from 6-8 mm along much of the left convexity. Small volume hyperdense blood layering on the left tentorium is stable. Suspicion of superimposed left frontal operculum hemorrhagic contusion on series 4, image 40, stable. Low-density right subdural hematoma or hygroma measures 5 mm in thickness and is stable since 09/07/2016. Trace residual subarachnoid hemorrhage along the left sylvian fissure and posterior right  convexity. Small volume layering intraventricular hemorrhage in the occipital horns is stable. No ventriculomegaly. No midline shift. Stable gray-white matter differentiation throughout the brain. No cortically based acute infarct identified. Vascular: Calcified atherosclerosis at the skull base. Skull: No acute osseous abnormality identified. Sinuses/Orbits: Visualized paranasal sinuses and mastoids are stable and well pneumatized. Other: Mild generalized scalp soft tissue edema. Negative orbits soft tissues. IMPRESSION: 1. Mixed density left and low-density right subdural hematomas are stable since 09/07/2016. No midline shift. 2. Stable small volume residual subarachnoid hemorrhage. Possible small associated left frontal operculum hemorrhagic contusion which is stable. 3. Stable small volume intraventricular hemorrhage with no ventriculomegaly. 4. No new intracranial abnormality. Electronically Signed   By: Genevie Ann M.D.   On: 09/10/2016 10:52        Scheduled Meds: . amLODipine  5 mg Oral Daily  . insulin aspart  0-5 Units Subcutaneous QHS  . insulin aspart  0-9 Units Subcutaneous TID WC  . insulin glargine  10 Units Subcutaneous Daily  . levETIRAcetam  500 mg Oral BID  . mirtazapine  7.5 mg Oral QHS  . pravastatin  20 mg Oral q1800  . sodium chloride flush  3 mL Intravenous Q12H  . tamsulosin  0.4 mg Oral Daily  . triamcinolone  2 spray Nasal QHS   Continuous Infusions: . 0.9 % NaCl with KCl 20 mEq / L 50 mL/hr at 09/10/16 2000     LOS: 4 days       Tawni Millers, MD [facility] Pager [phone]  If 7PM-7AM, please contact night-coverage [email or web address] Password [email or web address] 09/11/2016, 8:15 AM

## 2016-09-11 NOTE — Progress Notes (Signed)
Physical Therapy Treatment Patient Details Name: Kenneth Stark MRN: [id number] DOB: 15-Feb-1925 Today's Date: 09/11/2016    History of Present Illness Kenneth Rabinowitz Collinsis a 82 y.o.malewith medical history significant for TIAs on Plavix, hypertension, anemia, dyslipidemia and essential tremor. Patient was recently discharged to [facility] facility on 11/19 after an admission for subdural hematoma/subarachnoid hemorrhage after a fall while taking Plavix.   Now, pt presents with altered mentation of unclear etiology.    PT Comments    Patient progressing slowly towards PT goals. Continues to have altered mentation but difficult to fully assess due to pt being hard of hearing and not having his hearing aides. Tolerated SPT to chair with Max A of 2 due to fear of falling and resisting therapists. Grimacing in pain and holding head at times. RN notified. Recommend stedy to help pt transfer back to bed. Rn informed. Will continue to follow.   Follow Up Recommendations  SNF     Equipment Recommendations  3in1 (PT)    Recommendations for Other Services       Precautions / Restrictions Precautions Precautions: Fall Restrictions Weight Bearing Restrictions: No    Mobility  Bed Mobility Overal bed mobility: Needs Assistance Bed Mobility: Supine to Sit     Supine to sit: HOB elevated;Max assist     General bed mobility comments: Assist with LEs and to elevate trunk to get to EOB; grimacing in pain.  Transfers Overall transfer level: Needs assistance Equipment used: 2 person hand held assist Transfers: Sit to/from [facility] Sit to Stand: Max assist;+2 physical assistance;From elevated surface Stand pivot transfers: Max assist;+2 physical assistance;From elevated surface       General transfer comment: Pt resisting at times due to fear of falling; stood from EOB x2. SPT using chuck pad and assist of 2.  Ambulation/Gait                  Stairs            Wheelchair Mobility    Modified Rankin (Stroke Patients Only)       Balance Overall balance assessment: Needs assistance Sitting-balance support: Feet supported;Bilateral upper extremity supported Sitting balance-Leahy Scale: Poor Sitting balance - Comments: Initially requiring Mod A for static sitting balance progressing to Min A when not resisting and pushing posteriorly.   Standing balance support: During functional activity;Bilateral upper extremity supported Standing balance-Leahy Scale: Poor                      Cognition Arousal/Alertness: Awake/alert Behavior During Therapy: WFL for tasks assessed/performed Overall Cognitive Status: Impaired/Different from baseline     Current Attention Level: Focused Memory: Decreased short-term memory Following Commands: Follows one step commands with increased time;Follows one step commands inconsistently Safety/Judgement: Decreased awareness of safety;Decreased awareness of deficits Awareness: Intellectual Problem Solving: Slow processing General Comments: Pt does not have hearing aides in so difficult to assess cognition fully. Very fearful of falling. Mumbled speech with word finding difficulties and mixing up words.    Exercises      General Comments General comments (skin integrity, edema, etc.): Son and daughter in law present during session.      Pertinent Vitals/Pain Pain Assessment: Faces Faces Pain Scale: Hurts a little bit Pain Location: grimacing holding head Pain Descriptors / Indicators: Grimacing Pain Intervention(s): Monitored during session;Repositioned;Patient requesting pain meds-RN notified    Home Living  Prior Function            PT Goals (current goals can now be found in the care plan section) Progress towards PT goals: Progressing toward goals    Frequency    Min 2X/week      PT Plan Frequency needs to be updated     Co-evaluation             End of Session Equipment Utilized During Treatment: Gait belt Activity Tolerance: Other (comment) (limited by mentation and fear of falling) Patient left: in chair;with call bell/phone within reach;with chair alarm set;with family/visitor present     Time: [id number] PT Time Calculation (min) (ACUTE ONLY): 30 min  Charges:  $Therapeutic Activity: 23-37 mins                    G Codes:      [place] A Denine Brotz 09/11/2016, 3:27 PM Wray Kearns, [place], DPT [phone]

## 2016-09-11 NOTE — Care Management Note (Signed)
Case Management Note  Patient Details  Name: Kenneth Stark MRN: [id number] Date of Birth: Oct 27, 1924  Subjective/Objective:   Hx of SDH has AMS and cognitive decline, from [facility], plan is for [facility], CSW following.                   Action/Plan:   Expected Discharge Date:                  Expected Discharge Plan:  Skilled Nursing Facility  In-House Referral:  Clinical Social Work  Discharge planning Services  CM Consult  Post Acute Care Choice:    Choice offered to:     DME Arranged:    DME Agency:     HH Arranged:    [place] Agency:     Status of Service:  Completed, signed off  If discussed at [employer], dates discussed:    Additional Comments:  Zenon Mayo, RN 09/11/2016, 11:06 AM

## 2016-09-11 NOTE — Progress Notes (Signed)
Inpatient Diabetes Program Recommendations  AACE/ADA: New Consensus Statement on Inpatient Glycemic Control (2015)  Target Ranges:  Prepandial:   less than 140 mg/dL      Peak postprandial:   less than 180 mg/dL (1-2 hours)      Critically ill patients:  140 - 180 mg/dL  Results for TAI, HENNESS (MRN [id number]) as of 09/11/2016 11:32  Ref. Range 09/10/2016 07:49 09/10/2016 12:13 09/10/2016 17:23 09/10/2016 21:02 09/11/2016 07:58  Glucose-Capillary Latest Ref Range: 65 - 99 mg/dL 203 (H) 226 (H) 221 (H) 228 (H) 129 (H)   Results for ELIZEO, KAHLER (MRN [id number]) as of 09/11/2016 11:32  Ref. Range 09/07/2016 16:47  Hemoglobin A1C Latest Ref Range: 4.8 - 5.6 % 6.8 (H)   Results for EMAD, BROCKETT (MRN [id number]) as of 09/11/2016 11:32  Ref. Range 09/07/2016 13:00  Hemoglobin Latest Ref Range: 13.0 - 17.0 g/dL 9.4 (L)   Review of Glycemic Control  Diabetes history: No Outpatient Diabetes medications: NA Current orders for Inpatient glycemic control: Novolog 0-9 units TID with meals, Novolog 0-5 units QHS  Inpatient Diabetes Program Recommendations: Insulin - Basal: Noted patient was ordered Lantus which is no longer ordered since steroids were discontinued. HgbA1C: A1C 6.8% on 09/07/16; however, noted hemoglobin low (9.4 g/dL) which impacts accuracy of A1C results. Recommend patient follow up with PCP regarding hyperglycemia.   Thanks, Barnie Alderman, RN, MSN, CDE Diabetes Coordinator Inpatient Diabetes Program [phone] (Team Pager from 8am to 5pm)

## 2016-09-12 DIAGNOSIS — R739 Hyperglycemia, unspecified: Secondary | ICD-10-CM

## 2016-09-12 DIAGNOSIS — Z8673 Personal history of transient ischemic attack (TIA), and cerebral infarction without residual deficits: Secondary | ICD-10-CM

## 2016-09-12 LAB — GLUCOSE, CAPILLARY
GLUCOSE-CAPILLARY: 138 mg/dL — AB (ref 65–99)
GLUCOSE-CAPILLARY: 85 mg/dL (ref 65–99)
GLUCOSE-CAPILLARY: 130 mg/dL — AB (ref 65–99)
Glucose-Capillary: 122 mg/dL — ABNORMAL HIGH (ref 65–99)

## 2016-09-12 LAB — CBC WITH DIFFERENTIAL/PLATELET
BASOS ABS: 0 10*3/uL (ref 0.0–0.1)
Basophils Relative: 0 %
EOS PCT: 2 %
Eosinophils Absolute: 0.3 10*3/uL (ref 0.0–0.7)
HCT: 31 % — ABNORMAL LOW (ref 39.0–52.0)
HEMOGLOBIN: 10.4 g/dL — AB (ref 13.0–17.0)
LYMPHS ABS: 2.5 10*3/uL (ref 0.7–4.0)
LYMPHS PCT: 24 %
MCH: 30.8 pg (ref 26.0–34.0)
MCHC: 33.5 g/dL (ref 30.0–36.0)
MCV: 91.7 fL (ref 78.0–100.0)
Monocytes Absolute: 0.9 10*3/uL (ref 0.1–1.0)
Monocytes Relative: 9 %
NEUTROS ABS: 6.8 10*3/uL (ref 1.7–7.7)
NEUTROS PCT: 65 %
PLATELETS: 359 10*3/uL (ref 150–400)
RBC: 3.38 MIL/uL — AB (ref 4.22–5.81)
RDW: 14.5 % (ref 11.5–15.5)
WBC: 10.5 10*3/uL (ref 4.0–10.5)

## 2016-09-12 LAB — CULTURE, BLOOD (ROUTINE X 2)
CULTURE: NO GROWTH
Culture: NO GROWTH

## 2016-09-12 LAB — BASIC METABOLIC PANEL
ANION GAP: 7 (ref 5–15)
BUN: 17 mg/dL (ref 6–20)
CHLORIDE: 104 mmol/L (ref 101–111)
CO2: 25 mmol/L (ref 22–32)
Calcium: 8.6 mg/dL — ABNORMAL LOW (ref 8.9–10.3)
Creatinine, Ser: 0.71 mg/dL (ref 0.61–1.24)
GFR calc Af Amer: >60 mL/min (ref >60)
GLUCOSE: 129 mg/dL — AB (ref 65–99)
POTASSIUM: 3.4 mmol/L — AB (ref 3.5–5.1)
Sodium: 136 mmol/L (ref 135–145)

## 2016-09-12 NOTE — Progress Notes (Signed)
PROGRESS NOTE    Kenneth Stark  [id number] DOB: [birth year] DOA: 09/07/2016 PCP: Geoffery Lyons, MD   Brief Narrative:  82 year old male with PMH of CVA, TIA, degenerative joint disease, HTN, essential tremor, arthritis, HLD, hospitalized 09/01/16-09/03/16 for left subdural hematoma/SAH and left ear laceration status post fall and head trauma. Neurosurgery had consulted and managed conservatively. 2 days prior to this admission, he did not sleep well at night, progressively became confused, restless and was brought back to ED for concerns of worsening subdural hematoma. Neurosurgery evaluation with no worsening subdural. Persistent encephalopathy with delirium features.   Assessment & Plan:   Principal Problem:   Altered mental status Active Problems:   Subdural hematoma (HCC)   Essential hypertension   Dyslipidemia   Essential tremor   History of TIA (transient ischemic attack)   Subarachnoid hemorrhage (HCC)   Acute hyperglycemia   Dehydration   Transaminitis   Anemia   Leukocytosis   Acute respiratory failure with hypoxia (HCC)   1. Metabolic encephalopathy with delirium. Continue neuro checks, patient follows commands and able to communicate with words, his wife is at the bedside. Continue supportive care with IV fluids. Continue keppra and mirtazapine. Physical therapy evaluation and out of bed as tolerated. On remeron at night. Will plan to discharge in am to snf if continue to improve encephalopathy.  2. Hypokalemia.  K at 3.4. Na at 136, will continue saline with k supplements.  3. Anemia. Hb at 10 stable with no signs of bleeding.   4. T2DM. Capillary glucose 122-138-85  5. Acute hypoxic respiratory failure. Resolved, oxymetry at 92% on room air, will continue oxymetry monitoring aspiration precautions.   6. Subdural hematoma (chronic). No signs of worsening per imaging, will continue neuro checks and physical therapy evaluation. Physical therapy  evaluation. Fall precautions.   7. HTN. Blood pressure [id number] systolic. Will continue amlodipine for blood pressure control.   8. Right ear laceration. I spoke with Dr Benjamine Mola, and will plan to remove sutures as out patient in the next 48 hours.   DVT prophylaxis: scd  Code Status: Full  Family Communication: No family at the bedside  Disposition Plan: SNF   Consultants:   neurosurgery    Subjective: Patient more awake and alert, able to respond to simple questions, no chest pain or dyspnea, no nausea or vomiting.   Objective: Vitals:   09/11/16 1837 09/11/16 2124 09/12/16 0150 09/12/16 0549  BP: (!) 148/75 (!) 153/69 (!) 173/78 (!) 149/56  Pulse: 68 65 (!) 105 60  Resp: 20 20 20 18   Temp: 98.7 F (37.1 C) 97.9 F (36.6 C) 97.9 F (36.6 C) 98.5 F (36.9 C)  TempSrc: Oral Oral Oral Oral  SpO2: 97% 95% 96% 100%  Weight:      Height:        Intake/Output Summary (Last 24 hours) at 09/12/16 0905 Last data filed at 09/12/16 0900  Gross per 24 hour  Intake           1262.5 ml  Output             1150 ml  Net            112.5 ml   Filed Weights   09/10/16 0350 09/11/16 0307 09/11/16 1054  Weight: 85.8 kg (189 lb 2.5 oz) 87 kg (191 lb 12.8 oz) 84.8 kg (186 lb 15.2 oz)    Examination:  General exam: deconditioned E ENT: No pallor or icterus, oral mucosa mild dry.  Respiratory system:  Clear to auscultation. Respiratory effort normal. No wheezing, rales or rhonchi.  Cardiovascular system: S1 & S2 heard, RRR. No JVD, murmurs, rubs, gallops or clicks. No pedal edema. Gastrointestinal system: Abdomen is nondistended, soft and nontender. No organomegaly or masses felt. Normal bowel sounds heard. Central nervous system: Alert, follows commands, non focal, orientated to person.  Extremities: Symmetric 5 x 5 power. Skin: No rashes, lesions or ulcers  Data Reviewed: I have personally reviewed following labs and imaging studies  CBC:  Recent Labs Lab 09/07/16 1300  09/08/16 0603 09/09/16 0548 09/12/16 0212  WBC 12.1* 11.2* 10.9* 10.5  NEUTROABS 8.9*  --   --  6.8  HGB 9.4* 9.1* 9.9* 10.4*  HCT 27.4* 27.0* 29.1* 31.0*  MCV 90.7 91.2 90.4 91.7  PLT 255 235 297 [id number]   Basic Metabolic Panel:  Recent Labs Lab 09/07/16 1300 09/07/16 1402 09/07/16 1844 09/08/16 0603 09/09/16 0548 09/12/16 0212  NA 134*  --   --  139 137 136  K 2.7*  --   --  3.2* 3.9 3.4*  CL 98*  --   --  104 104 104  CO2 26  --   --  26 24 25   GLUCOSE 221*  --   --  140* 191* 129*  BUN 29*  --   --  20 21* 17  CREATININE 1.14  --   --  0.92 0.97 0.71  CALCIUM 9.7  --   --  9.0 8.8* 8.6*  MG  --  1.8  --   --   --   --   PHOS  --   --  3.2  --   --   --    GFR: Estimated Creatinine Clearance: 68.3 mL/min (by C-G formula based on SCr of 0.71 mg/dL). Liver Function Tests:  Recent Labs Lab 09/07/16 1300 09/08/16 0603  AST 42* 40  ALT 53 53  ALKPHOS 51 44  BILITOT 0.5 0.5  PROT 5.7* 5.3*  ALBUMIN 2.9* 2.6*   No results for input(s): LIPASE, AMYLASE in the last 168 hours.  Recent Labs Lab 09/09/16 1223  AMMONIA 19   Coagulation Profile: No results for input(s): INR, PROTIME in the last 168 hours. Cardiac Enzymes: No results for input(s): CKTOTAL, CKMB, CKMBINDEX, TROPONINI in the last 168 hours. BNP (last 3 results) No results for input(s): PROBNP in the last 8760 hours. HbA1C: No results for input(s): HGBA1C in the last 72 hours. CBG:  Recent Labs Lab 09/10/16 2102 09/11/16 0758 09/11/16 1624 09/11/16 2128 09/12/16 0638  GLUCAP 228* 129* 157* 158* 122*   Lipid Profile: No results for input(s): CHOL, HDL, LDLCALC, TRIG, CHOLHDL, LDLDIRECT in the last 72 hours. Thyroid Function Tests: No results for input(s): TSH, T4TOTAL, FREET4, T3FREE, THYROIDAB in the last 72 hours. Anemia Panel: No results for input(s): VITAMINB12, FOLATE, FERRITIN, TIBC, IRON, RETICCTPCT in the last 72 hours. Sepsis Labs:  Recent Labs Lab 09/07/16 1646 09/07/16 1647  09/09/16 0548 09/11/16 0523  PROCALCITON <0.10  --  <0.10 <0.10  LATICACIDVEN  --  0.8  --   --     Recent Results (from the past 240 hour(s))  Culture, blood (Routine X 2) w Reflex to ID Panel     Status: None (Preliminary result)   Collection Time: 09/07/16  1:00 PM  Result Value Ref Range Status   Specimen Description BLOOD RIGHT ANTECUBITAL  Final   Special Requests BOTTLES DRAWN AEROBIC AND ANAEROBIC 5CC  Final   Culture NO GROWTH 4 DAYS  Final   Report Status PENDING  Incomplete  Culture, blood (Routine X 2) w Reflex to ID Panel     Status: None (Preliminary result)   Collection Time: 09/07/16  4:45 PM  Result Value Ref Range Status   Specimen Description BLOOD LEFT ANTECUBITAL  Final   Special Requests BOTTLES DRAWN AEROBIC AND ANAEROBIC 5CC  Final   Culture NO GROWTH 4 DAYS  Final   Report Status PENDING  Incomplete         Radiology Studies: Ct Head Wo Contrast  Result Date: 09/10/2016 CLINICAL DATA:  82 year old male with intracranial hemorrhage after a fall earlier this month. Initial encounter. EXAM: CT HEAD WITHOUT CONTRAST TECHNIQUE: Contiguous axial images were obtained from the base of the skull through the vertex without intravenous contrast. COMPARISON:  Head CT without contrast 09/07/2016 and earlier. FINDINGS: Brain: Mixed density right subdural hematoma appears stable ranging in thickness from 6-8 mm along much of the left convexity. Small volume hyperdense blood layering on the left tentorium is stable. Suspicion of superimposed left frontal operculum hemorrhagic contusion on series 4, image 40, stable. Low-density right subdural hematoma or hygroma measures 5 mm in thickness and is stable since 09/07/2016. Trace residual subarachnoid hemorrhage along the left sylvian fissure and posterior right convexity. Small volume layering intraventricular hemorrhage in the occipital horns is stable. No ventriculomegaly. No midline shift. Stable gray-white matter  differentiation throughout the brain. No cortically based acute infarct identified. Vascular: Calcified atherosclerosis at the skull base. Skull: No acute osseous abnormality identified. Sinuses/Orbits: Visualized paranasal sinuses and mastoids are stable and well pneumatized. Other: Mild generalized scalp soft tissue edema. Negative orbits soft tissues. IMPRESSION: 1. Mixed density left and low-density right subdural hematomas are stable since 09/07/2016. No midline shift. 2. Stable small volume residual subarachnoid hemorrhage. Possible small associated left frontal operculum hemorrhagic contusion which is stable. 3. Stable small volume intraventricular hemorrhage with no ventriculomegaly. 4. No new intracranial abnormality. Electronically Signed   By: Genevie Ann M.D.   On: 09/10/2016 10:52        Scheduled Meds: . amLODipine  5 mg Oral Daily  . insulin aspart  0-5 Units Subcutaneous QHS  . insulin aspart  0-9 Units Subcutaneous TID WC  . levETIRAcetam  500 mg Oral BID  . mirtazapine  7.5 mg Oral QHS  . pravastatin  20 mg Oral q1800  . sodium chloride flush  3 mL Intravenous Q12H  . tamsulosin  0.4 mg Oral Daily  . triamcinolone  2 spray Nasal QHS   Continuous Infusions: . lactated ringers 75 mL/hr at 09/12/16 0551     LOS: 5 days       Wayne Brunker Gerome Apley, MD [facility] Pager [phone]  If 7PM-7AM, please contact night-coverage [email or web address] Password [email or web address] 09/12/2016, 9:05 AM

## 2016-09-12 NOTE — Clinical Social Work Note (Signed)
Per MD, pt will likely discharge tomorrow to [facility]. Facility is aware. CSW will continue to follow.   Darden Dates, MSW, LCSW  Clinical Social Worker  [phone]

## 2016-09-13 DIAGNOSIS — R4182 Altered mental status, unspecified: Secondary | ICD-10-CM

## 2016-09-13 DIAGNOSIS — I10 Essential (primary) hypertension: Secondary | ICD-10-CM

## 2016-09-13 DIAGNOSIS — G250 Essential tremor: Secondary | ICD-10-CM

## 2016-09-13 DIAGNOSIS — J9601 Acute respiratory failure with hypoxia: Secondary | ICD-10-CM

## 2016-09-13 LAB — GLUCOSE, CAPILLARY
GLUCOSE-CAPILLARY: 187 mg/dL — AB (ref 65–99)
Glucose-Capillary: 122 mg/dL — ABNORMAL HIGH (ref 65–99)
Glucose-Capillary: 153 mg/dL — ABNORMAL HIGH (ref 65–99)
Glucose-Capillary: 134 mg/dL — ABNORMAL HIGH (ref 65–99)

## 2016-09-13 MED ORDER — DONEPEZIL HCL 10 MG PO TABS
10.0000 mg | ORAL_TABLET | Freq: Every evening | ORAL | Status: DC
  Administered 2016-09-13 – 2016-09-15 (×3): 10 mg via ORAL
  Filled 2016-09-13 (×3): qty 1

## 2016-09-13 MED ORDER — IRBESARTAN 300 MG PO TABS
300.0000 mg | ORAL_TABLET | Freq: Every day | ORAL | Status: DC
  Administered 2016-09-13 – 2016-09-15 (×3): 300 mg via ORAL
  Filled 2016-09-13 (×3): qty 1

## 2016-09-13 MED ORDER — MEMANTINE HCL-DONEPEZIL HCL ER 28-10 MG PO CP24: 1.0000 | ORAL_CAPSULE | Freq: Every evening | ORAL | Status: DC

## 2016-09-13 MED ORDER — ESCITALOPRAM OXALATE 10 MG PO TABS
10.0000 mg | ORAL_TABLET | Freq: Every day | ORAL | Status: DC
  Administered 2016-09-13 – 2016-09-15 (×3): 10 mg via ORAL
  Filled 2016-09-13 (×3): qty 1

## 2016-09-13 MED ORDER — MEMANTINE HCL ER 28 MG PO CP24
28.0000 mg | ORAL_CAPSULE | Freq: Every evening | ORAL | Status: DC
  Administered 2016-09-13 – 2016-09-15 (×3): 28 mg via ORAL
  Filled 2016-09-13 (×3): qty 1

## 2016-09-13 NOTE — Progress Notes (Signed)
PROGRESS NOTE        PATIENT DETAILS Name: Kenneth Stark Age: 82 y.o. Sex: male Date of Birth: [birth year] Admit Date: 09/07/2016 Admitting Physician Waldemar Dickens, MD [id number], MD  Brief Narrative: Patient is a 82 y.o. male with history of mild dementia, recent subdural/subarachnoid hematoma following a mechanical fall admitted for altered mental status.  Subjective: Confused-very drowsy and lethargic this morning. Spouse at bedside-she endorses a waxing and waning dentition over the past few days.  Assessment/Plan: Waxing and waning altered mental status: Suspect delirium-suspect some sedation/drowsiness seen this morning could be coming from [place]. Curb sided neurology-per history of chart no overt seizures, EEG negative-recommendations were to discontinue Keppra.   Recent subdural/subarachnoid hemorrhage: Repeat CT scan earlier this admission demonstrated stability. Neurosurgery following. Antiplatelets remain on hold  Hypokalemia: Replete and recheck  Acute hypoxic respiratory failure: Resolved, suspect this was from atelectasis. Now on room air.  History of TIA: Hold antiplatelets due to recent SDH/SAH  History of essential tremor: Stable at this time-do not see any obvious tremor  Hypertension: Blood pressure uncontrolled, continue amlodipine, add Avapro.  Dementia: Resume Namenda and Aricept.  BPH: Continue Flomax  Dyslipidemia: Continue statin  History of insomnia: Continue Remeron  Status post recent fall with SAH/SDH and left ear laceration: Dr.Arrien-spoke with ENT-Dr. Teoh-recommendations were to remove sutures 48 hours from 11/28.  DVT Prophylaxis: SCD's  Code Status: Full code   Family Communication: None at bedside  Disposition Plan: Remain inpatient- SNF on discharge  Antimicrobial agents: None  Procedures: None  CONSULTS:  Neurosurgery  Time spent: 25- minutes-Greater than 50% of this time  was spent in counseling, explanation of diagnosis, planning of further management, and coordination of care.  MEDICATIONS: Anti-infectives    Start     Dose/Rate Route Frequency Ordered Stop   09/07/16 1700  ceFEPIme (MAXIPIME) 1 g in dextrose 5 % 50 mL IVPB  Status:  Discontinued     1 g 100 mL/hr over 30 Minutes Intravenous Every 24 hours 09/07/16 1629 09/08/16 2100   09/07/16 1700  vancomycin (VANCOCIN) IVPB 1000 mg/200 mL premix  Status:  Discontinued     1,000 mg 200 mL/hr over 60 Minutes Intravenous Every 24 hours 09/07/16 1652 09/08/16 2100      Scheduled Meds: . amLODipine  5 mg Oral Daily  . insulin aspart  0-5 Units Subcutaneous QHS  . insulin aspart  0-9 Units Subcutaneous TID WC  . mirtazapine  7.5 mg Oral QHS  . pravastatin  20 mg Oral q1800  . sodium chloride flush  3 mL Intravenous Q12H  . tamsulosin  0.4 mg Oral Daily  . triamcinolone  2 spray Nasal QHS   Continuous Infusions: PRN Meds:.acetaminophen **OR** acetaminophen, hydrALAZINE, mirtazapine, polyvinyl alcohol   PHYSICAL EXAM: Vital signs: Vitals:   09/12/16 2127 09/13/16 0108 09/13/16 0450 09/13/16 1006  BP: (!) 151/69 (!) 162/72 (!) 155/79 (!) 170/78  Pulse: 70 77 79 81  Resp: 20 18 20 20   Temp: 98.8 F (37.1 C) 98.5 F (36.9 C) 98.3 F (36.8 C) 98.5 F (36.9 C)  TempSrc: Oral Oral Oral Axillary  SpO2: 94% 93% 92% 94%  Weight:   84.3 kg (185 lb 13.6 oz)   Height:       Filed Weights   09/11/16 0307 09/11/16 1054 09/13/16 0450  Weight: 87 kg (191 lb 12.8  oz) 84.8 kg (186 lb 15.2 oz) 84.3 kg (185 lb 13.6 oz)   Body mass index is 27.85 kg/m.   General appearance :Awake but lethargic him a speech slow-mostly mumbling. Eyes:, pupils equally reactive to light and accomodation,no scleral icterus.Pink conjunctiva HEENT: Atraumatic and Normocephalic Neck: supple, no JVD. No cervical lymphadenopathy Resp:Good air entry bilaterally, no added sounds  CVS: S1 S2 regular, no murmurs.  GI: Bowel  sounds present, Non tender and not distended with no gaurding, rigidity or rebound.No organomegaly Extremities: B/L Lower Ext shows no edema, both legs are warm to touch Neurology:  speech clear,Non focal, sensation is grossly intact. Musculoskeletal:gait appears to be normal.No digital cyanosis Skin:No Rash, warm and dry Wounds:N/A  I have personally reviewed following labs and imaging studies  LABORATORY DATA: CBC:  Recent Labs Lab 09/07/16 1300 09/08/16 0603 09/09/16 0548 09/12/16 0212  WBC 12.1* 11.2* 10.9* 10.5  NEUTROABS 8.9*  --   --  6.8  HGB 9.4* 9.1* 9.9* 10.4*  HCT 27.4* 27.0* 29.1* 31.0*  MCV 90.7 91.2 90.4 91.7  PLT 255 235 297 [id number]    Basic Metabolic Panel:  Recent Labs Lab 09/07/16 1300 09/07/16 1402 09/07/16 1844 09/08/16 0603 09/09/16 0548 09/12/16 0212  NA 134*  --   --  139 137 136  K 2.7*  --   --  3.2* 3.9 3.4*  CL 98*  --   --  104 104 104  CO2 26  --   --  26 24 25   GLUCOSE 221*  --   --  140* 191* 129*  BUN 29*  --   --  20 21* 17  CREATININE 1.14  --   --  0.92 0.97 0.71  CALCIUM 9.7  --   --  9.0 8.8* 8.6*  MG  --  1.8  --   --   --   --   PHOS  --   --  3.2  --   --   --     GFR: Estimated Creatinine Clearance: 68.2 mL/min (by C-G formula based on SCr of 0.71 mg/dL).  Liver Function Tests:  Recent Labs Lab 09/07/16 1300 09/08/16 0603  AST 42* 40  ALT 53 53  ALKPHOS 51 44  BILITOT 0.5 0.5  PROT 5.7* 5.3*  ALBUMIN 2.9* 2.6*   No results for input(s): LIPASE, AMYLASE in the last 168 hours.  Recent Labs Lab 09/09/16 1223  AMMONIA 19    Coagulation Profile: No results for input(s): INR, PROTIME in the last 168 hours.  Cardiac Enzymes: No results for input(s): CKTOTAL, CKMB, CKMBINDEX, TROPONINI in the last 168 hours.  BNP (last 3 results) No results for input(s): PROBNP in the last 8760 hours.  HbA1C: No results for input(s): HGBA1C in the last 72 hours.  CBG:  Recent Labs Lab 09/12/16 1123 09/12/16 1620  09/12/16 2125 09/13/16 0622 09/13/16 1127  GLUCAP 138* 85 130* 122* 153*    Lipid Profile: No results for input(s): CHOL, HDL, LDLCALC, TRIG, CHOLHDL, LDLDIRECT in the last 72 hours.  Thyroid Function Tests: No results for input(s): TSH, T4TOTAL, FREET4, T3FREE, THYROIDAB in the last 72 hours.  Anemia Panel: No results for input(s): VITAMINB12, FOLATE, FERRITIN, TIBC, IRON, RETICCTPCT in the last 72 hours.  Urine analysis:    Component Value Date/Time   COLORURINE YELLOW 09/07/2016 1350   APPEARANCEUR CLEAR 09/07/2016 1350   LABSPEC 1.021 09/07/2016 1350   PHURINE 5.5 09/07/2016 1350   GLUCOSEU NEGATIVE 09/07/2016 1350   HGBUR NEGATIVE  09/07/2016 [place] 09/07/2016 1350   KETONESUR NEGATIVE 09/07/2016 1350   PROTEINUR NEGATIVE 09/07/2016 1350   NITRITE NEGATIVE 09/07/2016 1350   LEUKOCYTESUR NEGATIVE 09/07/2016 1350    Sepsis Labs: Lactic Acid, Venous    Component Value Date/Time   LATICACIDVEN 0.8 09/07/2016 1647    MICROBIOLOGY: Recent Results (from the past 240 hour(s))  Culture, blood (Routine X 2) w Reflex to ID Panel     Status: None   Collection Time: 09/07/16  1:00 PM  Result Value Ref Range Status   Specimen Description BLOOD RIGHT ANTECUBITAL  Final   Special Requests BOTTLES DRAWN AEROBIC AND ANAEROBIC 5CC  Final   Culture NO GROWTH 5 DAYS  Final   Report Status 09/12/2016 FINAL  Final  Culture, blood (Routine X 2) w Reflex to ID Panel     Status: None   Collection Time: 09/07/16  4:45 PM  Result Value Ref Range Status   Specimen Description BLOOD LEFT ANTECUBITAL  Final   Special Requests BOTTLES DRAWN AEROBIC AND ANAEROBIC 5CC  Final   Culture NO GROWTH 5 DAYS  Final   Report Status 09/12/2016 FINAL  Final    RADIOLOGY STUDIES/RESULTS: Ct Head Wo Contrast  Result Date: 09/10/2016 CLINICAL DATA:  82 year old male with intracranial hemorrhage after a fall earlier this month. Initial encounter. EXAM: CT HEAD WITHOUT CONTRAST  TECHNIQUE: Contiguous axial images were obtained from the base of the skull through the vertex without intravenous contrast. COMPARISON:  Head CT without contrast 09/07/2016 and earlier. FINDINGS: Brain: Mixed density right subdural hematoma appears stable ranging in thickness from 6-8 mm along much of the left convexity. Small volume hyperdense blood layering on the left tentorium is stable. Suspicion of superimposed left frontal operculum hemorrhagic contusion on series 4, image 40, stable. Low-density right subdural hematoma or hygroma measures 5 mm in thickness and is stable since 09/07/2016. Trace residual subarachnoid hemorrhage along the left sylvian fissure and posterior right convexity. Small volume layering intraventricular hemorrhage in the occipital horns is stable. No ventriculomegaly. No midline shift. Stable gray-white matter differentiation throughout the brain. No cortically based acute infarct identified. Vascular: Calcified atherosclerosis at the skull base. Skull: No acute osseous abnormality identified. Sinuses/Orbits: Visualized paranasal sinuses and mastoids are stable and well pneumatized. Other: Mild generalized scalp soft tissue edema. Negative orbits soft tissues. IMPRESSION: 1. Mixed density left and low-density right subdural hematomas are stable since 09/07/2016. No midline shift. 2. Stable small volume residual subarachnoid hemorrhage. Possible small associated left frontal operculum hemorrhagic contusion which is stable. 3. Stable small volume intraventricular hemorrhage with no ventriculomegaly. 4. No new intracranial abnormality. Electronically Signed   By: Genevie Ann M.D.   On: 09/10/2016 10:52   Ct Head Wo Contrast  Result Date: 09/07/2016 CLINICAL DATA:  Altered mental status.  Increased lethargy. EXAM: CT HEAD WITHOUT CONTRAST TECHNIQUE: Contiguous axial images were obtained from the base of the skull through the vertex without intravenous contrast. COMPARISON:  CT head  without contrast 09/02/2016 and 09/01/2016. FINDINGS: Brain: There is expected evolution of left subdural and subarachnoid hemorrhage. The size of the subdural component has not changed significantly. There is mixed density compatible with evolution of blood products. Additional subarachnoid and subdural blood layers along the tentorium, left greater than right. Intraventricular hemorrhage is more prominent than on the prior exam. Minimal left to right midline shift is present. There is some local mass effect on the left with crowding of the sulci and partial effacement of the left  lateral ventricle. No definite new areas of hemorrhage are present. There is slight dilation of the right lateral ventricle without hydrocephalus. Vascular: Extensive atherosclerotic calcifications are present within the cavernous internal carotid arteries. There is no hyperdense vessel. Skull: The calvarium is intact. Sinuses/Orbits: The paranasal sinuses and mastoid air cells are clear. IMPRESSION: 1. Evolving subdural and subarachnoid blood products, left greater than right. 2. Increased prominence of intraventricular hemorrhage, likely reflecting layering blood from the previous hemorrhage. 3. No definite acute hemorrhage. 4. Left-sided mass effect without significant midline shift. Electronically Signed   By: San Morelle M.D.   On: 09/07/2016 13:13   Ct Head Wo Contrast  Result Date: 09/02/2016 CLINICAL DATA:  Subdural hemorrhage after fall EXAM: CT HEAD WITHOUT CONTRAST TECHNIQUE: Contiguous axial images were obtained from the base of the skull through the vertex without intravenous contrast. COMPARISON:  Head CT 09/01/2016 FINDINGS: Brain: Subdural and subarachnoid blood over the left convexity is unchanged, with the subdural component measuring 7 mm in greatest thickness. There is persistent blood layering along the left tentorial leaflet. There is now intraventricular extension of hemorrhage without hydrocephalus.  No new mass effect or midline shift. Basal cisterns remain patent. There is no new area of hemorrhage. Vascular: No hyperdense vessel or unexpected calcification. Skull: Normal visualized skull base and calvarium. Decreased left scalp soft tissue swelling. Sinuses/Orbits: No sinus fluid levels or advanced mucosal thickening. No mastoid effusion. Normal orbits. IMPRESSION: 1. Unchanged appearance of left convexity subdural and subarachnoid blood. 2. Development of intraventricular extension of hemorrhage with blood layering in the occipital horns of the lateral ventricles. No hydrocephalus. 3. No new area of hemorrhage, new mass effect or midline shift. Electronically Signed   By: Ulyses Jarred M.D.   On: 09/02/2016 05:57   Ct Head Wo Contrast  Result Date: 09/01/2016 CLINICAL DATA:  Fall, complex left ear laceration EXAM: CT HEAD WITHOUT CONTRAST CT CERVICAL SPINE WITHOUT CONTRAST TECHNIQUE: Multidetector CT imaging of the head and cervical spine was performed following the standard protocol without intravenous contrast. Multiplanar CT image reconstructions of the cervical spine were also generated. COMPARISON:  MRI brain dated 09/30/2014 FINDINGS: CT HEAD FINDINGS Brain: Subdural hematoma along the left frontal convexity measuring up to 8 mm in thickness (series 2/image 21). Underlying subarachnoid hemorrhage along the left frontoparietal region and extending into the sylvian fissure (series 2/image 16). Associated subdural/subarachnoid hemorrhage extending along the anterior left temporal lobe in the middle cranial fossa (series 2/image 10). Mild subdural hematoma along the left tentorium (series 2/image 16). No evidence of acute infarction, hydrocephalus, or mass lesion/mass effect. No intraventricular hemorrhage. Basal cisterns remain patent. Global cortical atrophy.  Secondary ventricular prominence. Mild subcortical white matter and periventricular small vessel ischemic changes. Intracranial  atherosclerosis. Vascular: No hyperdense vessel or unexpected calcification. Skull: Normal. Negative for fracture or focal lesion. Sinuses/Orbits: The visualized paranasal sinuses are essentially clear. The mastoid air cells are unopacified. Other: Soft tissue laceration involving the left ear (series 2/ image 10). Overlying dressing. CT CERVICAL SPINE FINDINGS Alignment: Normal. Skull base and vertebrae: No acute fracture. No primary bone lesion or focal pathologic process. Soft tissues and spinal canal: No prevertebral fluid or swelling. No visible canal hematoma. Disc levels: Mild degenerative changes of the mid cervical spine. Spinal canal remains patent. Upper chest: Visualized lung apices are clear. Other: Visualized thyroid is unremarkable. IMPRESSION: Left frontal subdural hematoma measuring up to 8 mm. Additional subdural/subarachnoid hemorrhage on the left, as described above. No midline shift. Basal cisterns remain patent.  No intraventricular hemorrhage. No evidence of traumatic injury to the cervical spine. Critical Value/emergent results were called by telephone at the time of interpretation on 09/01/2016 at 12:03 pm to [facility], who verbally acknowledged these results. Electronically Signed   By: Julian Hy M.D.   On: 09/01/2016 12:04   Ct Cervical Spine Wo Contrast  Result Date: 09/01/2016 CLINICAL DATA:  Fall, complex left ear laceration EXAM: CT HEAD WITHOUT CONTRAST CT CERVICAL SPINE WITHOUT CONTRAST TECHNIQUE: Multidetector CT imaging of the head and cervical spine was performed following the standard protocol without intravenous contrast. Multiplanar CT image reconstructions of the cervical spine were also generated. COMPARISON:  MRI brain dated 09/30/2014 FINDINGS: CT HEAD FINDINGS Brain: Subdural hematoma along the left frontal convexity measuring up to 8 mm in thickness (series 2/image 21). Underlying subarachnoid hemorrhage along the left frontoparietal region and extending into  the sylvian fissure (series 2/image 16). Associated subdural/subarachnoid hemorrhage extending along the anterior left temporal lobe in the middle cranial fossa (series 2/image 10). Mild subdural hematoma along the left tentorium (series 2/image 16). No evidence of acute infarction, hydrocephalus, or mass lesion/mass effect. No intraventricular hemorrhage. Basal cisterns remain patent. Global cortical atrophy.  Secondary ventricular prominence. Mild subcortical white matter and periventricular small vessel ischemic changes. Intracranial atherosclerosis. Vascular: No hyperdense vessel or unexpected calcification. Skull: Normal. Negative for fracture or focal lesion. Sinuses/Orbits: The visualized paranasal sinuses are essentially clear. The mastoid air cells are unopacified. Other: Soft tissue laceration involving the left ear (series 2/ image 10). Overlying dressing. CT CERVICAL SPINE FINDINGS Alignment: Normal. Skull base and vertebrae: No acute fracture. No primary bone lesion or focal pathologic process. Soft tissues and spinal canal: No prevertebral fluid or swelling. No visible canal hematoma. Disc levels: Mild degenerative changes of the mid cervical spine. Spinal canal remains patent. Upper chest: Visualized lung apices are clear. Other: Visualized thyroid is unremarkable. IMPRESSION: Left frontal subdural hematoma measuring up to 8 mm. Additional subdural/subarachnoid hemorrhage on the left, as described above. No midline shift. Basal cisterns remain patent. No intraventricular hemorrhage. No evidence of traumatic injury to the cervical spine. Critical Value/emergent results were called by telephone at the time of interpretation on 09/01/2016 at 12:03 pm to [facility], who verbally acknowledged these results. Electronically Signed   By: Julian Hy M.D.   On: 09/01/2016 12:04   Dg Chest Port 1 View  Result Date: 09/08/2016 CLINICAL DATA:  Hypoxia, history hypertension, small vessel disease,  essential tremor EXAM: PORTABLE CHEST 1 VIEW COMPARISON:  Portable exam 1440 hours compared to 09/07/2016 FINDINGS: Upper normal size of cardiac silhouette. Mediastinal contours and pulmonary vascularity normal. Atherosclerotic calcifications aorta. Bibasilar atelectasis. Lungs otherwise clear. No pleural effusion or pneumothorax. Old healed RIGHT rib fractures noted. IMPRESSION: Bibasilar atelectasis. Electronically Signed   By: Lavonia Dana M.D.   On: 09/08/2016 14:47   Dg Chest Port 1 View  Result Date: 09/07/2016 CLINICAL DATA:  Fever.  Acute mental status change. EXAM: PORTABLE CHEST 1 VIEW COMPARISON:  January 15, 2007 FINDINGS: The heart, hila, and mediastinum are unchanged. No pneumothorax. Mild atelectasis in the left base. A small left effusion is not excluded. IMPRESSION: Mild atelectasis in the left base. A small left effusion is not excluded. Electronically Signed   By: Dorise Bullion III M.D   On: 09/07/2016 14:22     LOS: 6 days   Oren Binet, MD  [facility] Pager:[phone]  If 7PM-7AM, please contact night-coverage [email or web address] Password [email or web address] 09/13/2016, 1:13 PM

## 2016-09-13 NOTE — Care Management Note (Signed)
Case Management Note  Patient Details  Name: Kenneth Stark MRN: [id number] Date of Birth: 1924-12-02  Subjective/Objective:                    Action/Plan: Plan is to discharge to SNF when medically stable. CM following for further d/c needs.   Expected Discharge Date:                  Expected Discharge Plan:  Skilled Nursing Facility  In-House Referral:  Clinical Social Work  Discharge planning Services  CM Consult  Post Acute Care Choice:    Choice offered to:     DME Arranged:    DME Agency:     HH Arranged:    [place] Agency:     Status of Service:  Completed, signed off  If discussed at [employer], dates discussed:    Additional Comments:  Pollie Friar, RN 09/13/2016, 12:14 PM

## 2016-09-13 NOTE — Care Management Important Message (Signed)
Important Message  Patient Details  Name: Kenneth Stark MRN: [id number] Date of Birth: 27-Dec-1924   Medicare Important Message Given:  Yes    [name] 09/13/2016, 11:05 AM

## 2016-09-14 ENCOUNTER — Encounter (HOSPITAL_COMMUNITY): Payer: Self-pay | Admitting: General Practice

## 2016-09-14 DIAGNOSIS — R410 Disorientation, unspecified: Secondary | ICD-10-CM

## 2016-09-14 DIAGNOSIS — I6200 Nontraumatic subdural hemorrhage, unspecified: Secondary | ICD-10-CM

## 2016-09-14 LAB — GLUCOSE, CAPILLARY
GLUCOSE-CAPILLARY: 137 mg/dL — AB (ref 65–99)
GLUCOSE-CAPILLARY: 81 mg/dL (ref 65–99)
GLUCOSE-CAPILLARY: 148 mg/dL — AB (ref 65–99)
Glucose-Capillary: 166 mg/dL — ABNORMAL HIGH (ref 65–99)

## 2016-09-14 NOTE — Progress Notes (Signed)
PROGRESS NOTE        PATIENT DETAILS Name: Kenneth Stark Age: 82 y.o. Sex: male Date of Birth: [birth year] Admit Date: 09/07/2016 Admitting Physician Waldemar Dickens, MD AY:[id number] A, MD  Brief Narrative: Patient is a 82 y.o. male with history of mild dementia, recent subdural/subarachnoid hematoma following a mechanical fall admitted for altered mental status.  Subjective: Lethargic, confused-continues to mumble-does not follow any of my commands.  Assessment/Plan: Acute encephalopathy: Suspect this is multifactorial with recent traumatic brain injury from a fall causing SAH/SDH, suspected delirium associated with dementia. Keppra was stopped yesterday due to excessive sedation-no improvement yet. EEG negative. I have formally consulted neurology today.   Recent subdural/subarachnoid hemorrhage: Repeat CT scan earlier this admission demonstrated stability. Neurosurgery following. Antiplatelets remain on hold  Hypokalemia: Replete and recheck  Acute hypoxic respiratory failure: Resolved, suspect this was from atelectasis. Now on room air.  History of TIA: Hold antiplatelets due to recent SDH/SAH  History of essential tremor: Stable at this time-do not see any obvious tremor  Hypertension: Blood pressure better controlled, continue amlodipine and Avapro-monitor for another 24 hours before adjusting.   Dementia: Continue Namenda and Aricept.  BPH: Continue Flomax  Dyslipidemia: Continue statin  History of insomnia: Continue Remeron  Status post recent fall with SAH/SDH and left ear laceration: Dr.Arrien-spoke with ENT-Dr. Teoh-recommendations were to remove sutures 48 hours from 11/28.  DVT Prophylaxis: SCD's  Code Status: Full code   Family Communication: None at bedside  Disposition Plan: Remain inpatient- SNF on discharge  Antimicrobial agents: None  Procedures: None  CONSULTS:  Neurosurgery   Neurology  Time  spent: 25- minutes-Greater than 50% of this time was spent in counseling, explanation of diagnosis, planning of further management, and coordination of care.  MEDICATIONS: Anti-infectives    Start     Dose/Rate Route Frequency Ordered Stop   09/07/16 1700  ceFEPIme (MAXIPIME) 1 g in dextrose 5 % 50 mL IVPB  Status:  Discontinued     1 g 100 mL/hr over 30 Minutes Intravenous Every 24 hours 09/07/16 1629 09/08/16 2100   09/07/16 1700  vancomycin (VANCOCIN) IVPB 1000 mg/200 mL premix  Status:  Discontinued     1,000 mg 200 mL/hr over 60 Minutes Intravenous Every 24 hours 09/07/16 1652 09/08/16 2100      Scheduled Meds: . amLODipine  5 mg Oral Daily  . memantine  28 mg Oral QPM   And  . donepezil  10 mg Oral QPM  . escitalopram  10 mg Oral Daily  . insulin aspart  0-5 Units Subcutaneous QHS  . insulin aspart  0-9 Units Subcutaneous TID WC  . irbesartan  300 mg Oral Daily  . mirtazapine  7.5 mg Oral QHS  . pravastatin  20 mg Oral q1800  . sodium chloride flush  3 mL Intravenous Q12H  . tamsulosin  0.4 mg Oral Daily  . triamcinolone  2 spray Nasal QHS   Continuous Infusions: PRN Meds:.acetaminophen **OR** acetaminophen, hydrALAZINE, mirtazapine, polyvinyl alcohol   PHYSICAL EXAM: Vital signs: Vitals:   09/13/16 2103 09/14/16 0101 09/14/16 0456 09/14/16 0851  BP: (!) 144/69 (!) 149/72 (!) 144/63 (!) 160/61  Pulse: 88 83 88 65  Resp: 20 18 18 16   Temp: 98.5 F (36.9 C) 98.3 F (36.8 C) 98.1 F (36.7 C) 98.8 F (37.1 C)  TempSrc: Oral Oral Oral Oral  SpO2: 96% 94% 96% 96%  Weight:   84.3 kg (185 lb 13.6 oz)   Height:       Filed Weights   09/11/16 1054 09/13/16 0450 09/14/16 0456  Weight: 84.8 kg (186 lb 15.2 oz) 84.3 kg (185 lb 13.6 oz) 84.3 kg (185 lb 13.6 oz)   Body mass index is 27.85 kg/m.   General appearance :Awake but lethargic, Mumbled speech-does not follow any commands  Eyes:, pupils equally reactive to light and accomodation,no scleral icterus.Pink  conjunctiva HEENT: Atraumatic and Normocephalic Neck: supple, no JVD. No cervical lymphadenopathy Resp:Good air entry bilaterally, no added sounds  CVS: S1 S2 regular, no murmurs.  GI: Bowel sounds present, Non tender and not distended with no gaurding, rigidity or rebound.No organomegaly Extremities: B/L Lower Ext shows no edema, both legs are warm to touch Neurology:  Seems to be moving all 4 extremities. Musculoskeletal:gait appears to be normal.No digital cyanosis Skin:No Rash, warm and dry Wounds:N/A  I have personally reviewed following labs and imaging studies  LABORATORY DATA: CBC:  Recent Labs Lab 09/07/16 1300 09/08/16 0603 09/09/16 0548 09/12/16 0212  WBC 12.1* 11.2* 10.9* 10.5  NEUTROABS 8.9*  --   --  6.8  HGB 9.4* 9.1* 9.9* 10.4*  HCT 27.4* 27.0* 29.1* 31.0*  MCV 90.7 91.2 90.4 91.7  PLT 255 235 297 [id number]    Basic Metabolic Panel:  Recent Labs Lab 09/07/16 1300 09/07/16 1402 09/07/16 1844 09/08/16 0603 09/09/16 0548 09/12/16 0212  NA 134*  --   --  139 137 136  K 2.7*  --   --  3.2* 3.9 3.4*  CL 98*  --   --  104 104 104  CO2 26  --   --  26 24 25   GLUCOSE 221*  --   --  140* 191* 129*  BUN 29*  --   --  20 21* 17  CREATININE 1.14  --   --  0.92 0.97 0.71  CALCIUM 9.7  --   --  9.0 8.8* 8.6*  MG  --  1.8  --   --   --   --   PHOS  --   --  3.2  --   --   --     GFR: Estimated Creatinine Clearance: 68.2 mL/min (by C-G formula based on SCr of 0.71 mg/dL).  Liver Function Tests:  Recent Labs Lab 09/07/16 1300 09/08/16 0603  AST 42* 40  ALT 53 53  ALKPHOS 51 44  BILITOT 0.5 0.5  PROT 5.7* 5.3*  ALBUMIN 2.9* 2.6*   No results for input(s): LIPASE, AMYLASE in the last 168 hours.  Recent Labs Lab 09/09/16 1223  AMMONIA 19    Coagulation Profile: No results for input(s): INR, PROTIME in the last 168 hours.  Cardiac Enzymes: No results for input(s): CKTOTAL, CKMB, CKMBINDEX, TROPONINI in the last 168 hours.  BNP (last 3  results) No results for input(s): PROBNP in the last 8760 hours.  HbA1C: No results for input(s): HGBA1C in the last 72 hours.  CBG:  Recent Labs Lab 09/13/16 0622 09/13/16 1127 09/13/16 1720 09/13/16 2103 09/14/16 0618  GLUCAP 122* 153* 134* 187* 137*    Lipid Profile: No results for input(s): CHOL, HDL, LDLCALC, TRIG, CHOLHDL, LDLDIRECT in the last 72 hours.  Thyroid Function Tests: No results for input(s): TSH, T4TOTAL, FREET4, T3FREE, THYROIDAB in the last 72 hours.  Anemia Panel: No results for input(s): VITAMINB12, FOLATE, FERRITIN, TIBC, IRON, RETICCTPCT in the last 72 hours.  Urine analysis:    Component Value Date/Time   COLORURINE YELLOW 09/07/2016 1350   APPEARANCEUR CLEAR 09/07/2016 1350   LABSPEC 1.021 09/07/2016 1350   PHURINE 5.5 09/07/2016 1350   GLUCOSEU NEGATIVE 09/07/2016 1350   HGBUR NEGATIVE 09/07/2016 1350   BILIRUBINUR NEGATIVE 09/07/2016 1350   KETONESUR NEGATIVE 09/07/2016 1350   PROTEINUR NEGATIVE 09/07/2016 1350   NITRITE NEGATIVE 09/07/2016 1350   LEUKOCYTESUR NEGATIVE 09/07/2016 1350    Sepsis Labs: Lactic Acid, Venous    Component Value Date/Time   LATICACIDVEN 0.8 09/07/2016 1647    MICROBIOLOGY: Recent Results (from the past 240 hour(s))  Culture, blood (Routine X 2) w Reflex to ID Panel     Status: None   Collection Time: 09/07/16  1:00 PM  Result Value Ref Range Status   Specimen Description BLOOD RIGHT ANTECUBITAL  Final   Special Requests BOTTLES DRAWN AEROBIC AND ANAEROBIC 5CC  Final   Culture NO GROWTH 5 DAYS  Final   Report Status 09/12/2016 FINAL  Final  Culture, blood (Routine X 2) w Reflex to ID Panel     Status: None   Collection Time: 09/07/16  4:45 PM  Result Value Ref Range Status   Specimen Description BLOOD LEFT ANTECUBITAL  Final   Special Requests BOTTLES DRAWN AEROBIC AND ANAEROBIC 5CC  Final   Culture NO GROWTH 5 DAYS  Final   Report Status 09/12/2016 FINAL  Final    RADIOLOGY STUDIES/RESULTS: Ct  Head Wo Contrast  Result Date: 09/10/2016 CLINICAL DATA:  82 year old male with intracranial hemorrhage after a fall earlier this month. Initial encounter. EXAM: CT HEAD WITHOUT CONTRAST TECHNIQUE: Contiguous axial images were obtained from the base of the skull through the vertex without intravenous contrast. COMPARISON:  Head CT without contrast 09/07/2016 and earlier. FINDINGS: Brain: Mixed density right subdural hematoma appears stable ranging in thickness from 6-8 mm along much of the left convexity. Small volume hyperdense blood layering on the left tentorium is stable. Suspicion of superimposed left frontal operculum hemorrhagic contusion on series 4, image 40, stable. Low-density right subdural hematoma or hygroma measures 5 mm in thickness and is stable since 09/07/2016. Trace residual subarachnoid hemorrhage along the left sylvian fissure and posterior right convexity. Small volume layering intraventricular hemorrhage in the occipital horns is stable. No ventriculomegaly. No midline shift. Stable gray-white matter differentiation throughout the brain. No cortically based acute infarct identified. Vascular: Calcified atherosclerosis at the skull base. Skull: No acute osseous abnormality identified. Sinuses/Orbits: Visualized paranasal sinuses and mastoids are stable and well pneumatized. Other: Mild generalized scalp soft tissue edema. Negative orbits soft tissues. IMPRESSION: 1. Mixed density left and low-density right subdural hematomas are stable since 09/07/2016. No midline shift. 2. Stable small volume residual subarachnoid hemorrhage. Possible small associated left frontal operculum hemorrhagic contusion which is stable. 3. Stable small volume intraventricular hemorrhage with no ventriculomegaly. 4. No new intracranial abnormality. Electronically Signed   By: Genevie Ann M.D.   On: 09/10/2016 10:52   Ct Head Wo Contrast  Result Date: 09/07/2016 CLINICAL DATA:  Altered mental status.  Increased  lethargy. EXAM: CT HEAD WITHOUT CONTRAST TECHNIQUE: Contiguous axial images were obtained from the base of the skull through the vertex without intravenous contrast. COMPARISON:  CT head without contrast 09/02/2016 and 09/01/2016. FINDINGS: Brain: There is expected evolution of left subdural and subarachnoid hemorrhage. The size of the subdural component has not changed significantly. There is mixed density compatible with evolution of blood products. Additional subarachnoid and subdural blood layers along the tentorium,  left greater than right. Intraventricular hemorrhage is more prominent than on the prior exam. Minimal left to right midline shift is present. There is some local mass effect on the left with crowding of the sulci and partial effacement of the left lateral ventricle. No definite new areas of hemorrhage are present. There is slight dilation of the right lateral ventricle without hydrocephalus. Vascular: Extensive atherosclerotic calcifications are present within the cavernous internal carotid arteries. There is no hyperdense vessel. Skull: The calvarium is intact. Sinuses/Orbits: The paranasal sinuses and mastoid air cells are clear. IMPRESSION: 1. Evolving subdural and subarachnoid blood products, left greater than right. 2. Increased prominence of intraventricular hemorrhage, likely reflecting layering blood from the previous hemorrhage. 3. No definite acute hemorrhage. 4. Left-sided mass effect without significant midline shift. Electronically Signed   By: San Morelle M.D.   On: 09/07/2016 13:13   Ct Head Wo Contrast  Result Date: 09/02/2016 CLINICAL DATA:  Subdural hemorrhage after fall EXAM: CT HEAD WITHOUT CONTRAST TECHNIQUE: Contiguous axial images were obtained from the base of the skull through the vertex without intravenous contrast. COMPARISON:  Head CT 09/01/2016 FINDINGS: Brain: Subdural and subarachnoid blood over the left convexity is unchanged, with the subdural  component measuring 7 mm in greatest thickness. There is persistent blood layering along the left tentorial leaflet. There is now intraventricular extension of hemorrhage without hydrocephalus. No new mass effect or midline shift. Basal cisterns remain patent. There is no new area of hemorrhage. Vascular: No hyperdense vessel or unexpected calcification. Skull: Normal visualized skull base and calvarium. Decreased left scalp soft tissue swelling. Sinuses/Orbits: No sinus fluid levels or advanced mucosal thickening. No mastoid effusion. Normal orbits. IMPRESSION: 1. Unchanged appearance of left convexity subdural and subarachnoid blood. 2. Development of intraventricular extension of hemorrhage with blood layering in the occipital horns of the lateral ventricles. No hydrocephalus. 3. No new area of hemorrhage, new mass effect or midline shift. Electronically Signed   By: Ulyses Jarred M.D.   On: 09/02/2016 05:57   Ct Head Wo Contrast  Result Date: 09/01/2016 CLINICAL DATA:  Fall, complex left ear laceration EXAM: CT HEAD WITHOUT CONTRAST CT CERVICAL SPINE WITHOUT CONTRAST TECHNIQUE: Multidetector CT imaging of the head and cervical spine was performed following the standard protocol without intravenous contrast. Multiplanar CT image reconstructions of the cervical spine were also generated. COMPARISON:  MRI brain dated 09/30/2014 FINDINGS: CT HEAD FINDINGS Brain: Subdural hematoma along the left frontal convexity measuring up to 8 mm in thickness (series 2/image 21). Underlying subarachnoid hemorrhage along the left frontoparietal region and extending into the sylvian fissure (series 2/image 16). Associated subdural/subarachnoid hemorrhage extending along the anterior left temporal lobe in the middle cranial fossa (series 2/image 10). Mild subdural hematoma along the left tentorium (series 2/image 16). No evidence of acute infarction, hydrocephalus, or mass lesion/mass effect. No intraventricular hemorrhage.  Basal cisterns remain patent. Global cortical atrophy.  Secondary ventricular prominence. Mild subcortical white matter and periventricular small vessel ischemic changes. Intracranial atherosclerosis. Vascular: No hyperdense vessel or unexpected calcification. Skull: Normal. Negative for fracture or focal lesion. Sinuses/Orbits: The visualized paranasal sinuses are essentially clear. The mastoid air cells are unopacified. Other: Soft tissue laceration involving the left ear (series 2/ image 10). Overlying dressing. CT CERVICAL SPINE FINDINGS Alignment: Normal. Skull base and vertebrae: No acute fracture. No primary bone lesion or focal pathologic process. Soft tissues and spinal canal: No prevertebral fluid or swelling. No visible canal hematoma. Disc levels: Mild degenerative changes of the mid cervical spine.  Spinal canal remains patent. Upper chest: Visualized lung apices are clear. Other: Visualized thyroid is unremarkable. IMPRESSION: Left frontal subdural hematoma measuring up to 8 mm. Additional subdural/subarachnoid hemorrhage on the left, as described above. No midline shift. Basal cisterns remain patent. No intraventricular hemorrhage. No evidence of traumatic injury to the cervical spine. Critical Value/emergent results were called by telephone at the time of interpretation on 09/01/2016 at 12:03 pm to [facility], who verbally acknowledged these results. Electronically Signed   By: Julian Hy M.D.   On: 09/01/2016 12:04   Ct Cervical Spine Wo Contrast  Result Date: 09/01/2016 CLINICAL DATA:  Fall, complex left ear laceration EXAM: CT HEAD WITHOUT CONTRAST CT CERVICAL SPINE WITHOUT CONTRAST TECHNIQUE: Multidetector CT imaging of the head and cervical spine was performed following the standard protocol without intravenous contrast. Multiplanar CT image reconstructions of the cervical spine were also generated. COMPARISON:  MRI brain dated 09/30/2014 FINDINGS: CT HEAD FINDINGS Brain: Subdural  hematoma along the left frontal convexity measuring up to 8 mm in thickness (series 2/image 21). Underlying subarachnoid hemorrhage along the left frontoparietal region and extending into the sylvian fissure (series 2/image 16). Associated subdural/subarachnoid hemorrhage extending along the anterior left temporal lobe in the middle cranial fossa (series 2/image 10). Mild subdural hematoma along the left tentorium (series 2/image 16). No evidence of acute infarction, hydrocephalus, or mass lesion/mass effect. No intraventricular hemorrhage. Basal cisterns remain patent. Global cortical atrophy.  Secondary ventricular prominence. Mild subcortical white matter and periventricular small vessel ischemic changes. Intracranial atherosclerosis. Vascular: No hyperdense vessel or unexpected calcification. Skull: Normal. Negative for fracture or focal lesion. Sinuses/Orbits: The visualized paranasal sinuses are essentially clear. The mastoid air cells are unopacified. Other: Soft tissue laceration involving the left ear (series 2/ image 10). Overlying dressing. CT CERVICAL SPINE FINDINGS Alignment: Normal. Skull base and vertebrae: No acute fracture. No primary bone lesion or focal pathologic process. Soft tissues and spinal canal: No prevertebral fluid or swelling. No visible canal hematoma. Disc levels: Mild degenerative changes of the mid cervical spine. Spinal canal remains patent. Upper chest: Visualized lung apices are clear. Other: Visualized thyroid is unremarkable. IMPRESSION: Left frontal subdural hematoma measuring up to 8 mm. Additional subdural/subarachnoid hemorrhage on the left, as described above. No midline shift. Basal cisterns remain patent. No intraventricular hemorrhage. No evidence of traumatic injury to the cervical spine. Critical Value/emergent results were called by telephone at the time of interpretation on 09/01/2016 at 12:03 pm to [facility], who verbally acknowledged these results. Electronically  Signed   By: Julian Hy M.D.   On: 09/01/2016 12:04   Dg Chest Port 1 View  Result Date: 09/08/2016 CLINICAL DATA:  Hypoxia, history hypertension, small vessel disease, essential tremor EXAM: PORTABLE CHEST 1 VIEW COMPARISON:  Portable exam 1440 hours compared to 09/07/2016 FINDINGS: Upper normal size of cardiac silhouette. Mediastinal contours and pulmonary vascularity normal. Atherosclerotic calcifications aorta. Bibasilar atelectasis. Lungs otherwise clear. No pleural effusion or pneumothorax. Old healed RIGHT rib fractures noted. IMPRESSION: Bibasilar atelectasis. Electronically Signed   By: Lavonia Dana M.D.   On: 09/08/2016 14:47   Dg Chest Port 1 View  Result Date: 09/07/2016 CLINICAL DATA:  Fever.  Acute mental status change. EXAM: PORTABLE CHEST 1 VIEW COMPARISON:  January 15, 2007 FINDINGS: The heart, hila, and mediastinum are unchanged. No pneumothorax. Mild atelectasis in the left base. A small left effusion is not excluded. IMPRESSION: Mild atelectasis in the left base. A small left effusion is not excluded. Electronically Signed  By: Dorise Bullion III M.D   On: 09/07/2016 14:22     LOS: 7 days   Oren Binet, MD  [facility] Pager:[phone]  If 7PM-7AM, please contact night-coverage [email or web address] Password [email or web address] 09/14/2016, 10:00 AM

## 2016-09-14 NOTE — Consult Note (Signed)
Neurology Consult Note  Reason for Consultation: Altered mental status  Requesting provider: Oren Binet, MD  CC: None--patient is encephalopathic and unable to provide information  HPI: This is an 66-yo RH man admitted on 09/07/16 for decline in functional status at his rehab facility. History is obtained from the patient's wife and from detailed review of the patient's medical record. He is too encephalopathic to be able to offer any information.   The patient was in his usual state of health until 09/01/16 when he suffered a fall from standing height, striking the back on his head on concrete. He was sent to the ED for evaluation were CT of the head revealed a subdural hematomas in the left frontal region and along the left tentorium with left frontal subarachnoid hemorrhage. ED notes document that he was AAOx3 with no focal neurologic deficits on presentation. He had a left ear laceration that was sutured by ENT. He was seen in consultation by neurosurgery who recommended platelet transfusion (due to patient taking Plavix) and supportive management. His Plavis was to be held for one week. All provider notes indicate that the patient was AAOx3 and appropriate with no deficits. PT evaluated the patient and noted he had decreased awareness of deficits, decreased initiation, and slowed processing along with some speech difficulties described as mixing up his words. He was discharged to SNF on 09/03/16.   His wife reports that his thinking has been slow since his fall and he was not at his usual baseline during or after that admission. At the SNF, his wife reports that he seemed to be showing some slow improvement initially but after a couple of days this slowed. He became more lethargic and was not longer able to feed himself or participate with therapy. He was sent back to the ED for evaluation. Per ED notes, facility reported a low-grade temp (max 100.9). He was not sleeping well at the SNF over  the two nights prior to presentation. On exam by the ED PA, he was able to state his name and follow most commands with no focal deficits appreciated. CTH was performed and showed evolving subdural and subarachnoid hemorrhage with intraventricular blood. Labs were notable for a mild leukocytosis and potassium 2.7. He was admitted for further evaluation. Neurosurgery recommended starting Keppra 500 mg BID along with low-dose steroids. EEG was obtained and showed no evidence of seizure. He was initially hypoxic and required supplemental oxygen. This was felt to be due to atelectasis. Steroids were stopped on 11/26 as they did not appear to be beneficial. On 11/26 he was noted to be more somnolent, confused and mumbling but still nonfocal. His wife reports that he remained very somnolent and his Keppra was stopped yesterday with increased level of alertness this morning. Neurology consultation is requested because of his persistent altered mental status.   His wife reports that the patient has a history of dementia. She states that he was "delusional" and had short-term memory problems. His PCP placed him on donepezil about 2-3 years ago and later switched this to [employer]. She feels that he did very well on the medication with resolution of delusions and stabilization of his memory. She states that he has continued to drive and she has not had any concerns about his ability to do so. She does not feel like his dementia has significantly limited his ability to function since he was placed on meds. He has a family history of Alzheimer's disease in his mother, brother, and maternal aunt.  PMH:  Past Medical History:  Diagnosis Date  . Abnormality of gait   . Arthritis   . Cervical spondylosis   . Degenerative joint disease (DJD) of lumbar spine   . Diplopia   . Dyslipidemia   . Essential tremor   . Hearing difficulty    hearing aids  . Hypertension   . 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 Small vessel disease     PSH:  Past Surgical History:  Procedure Laterality Date  . KNEE ARTHROSCOPY Left    Dr. Hart Robinsons 2002  . knee injections Right    Dr. Adriana Mccallum    Family history: Family History  Problem Relation Age of Onset  . Stroke Mother   . Stroke Father   . Heart disease Father   . Dementia Brother   . Renal Disease Brother   . Renal Disease Daughter     Social history:  Social History   Social History  . Marital status: Married    Spouse name: N/A  . Number of children: 4  . Years of education: N/A   Occupational History  . Retired     [employer]   Social History Main Topics  . Smoking status: Never Smoker  . Smokeless tobacco: Never Used  . Alcohol use Yes  . Drug use: No  . Sexual activity: Not on file   Other Topics Concern  . Not on file   Social History Narrative  . No narrative on file    Current outpatient meds: Current Meds  Medication Sig  . acetaminophen (TYLENOL) 325 MG tablet Take 2 tablets (650 mg total) by mouth every 6 (six) hours as needed for mild pain (or Fever >/= 101).  Marland Kitchen amLODipine (NORVASC) 10 MG tablet Take 10 mg by mouth daily.  . Artificial Tear Ointment (EYE LUBRICANT OP) Place 1 drop into both eyes daily.  . Ascorbic Acid (VITAMIN C) 1000 MG tablet Take 1,000 mg by mouth 3 (three) times daily.   . Calcium Carbonate-Vitamin D (CALTRATE 600+D PO) Take 600 mg by mouth 3 (three) times daily.   Marland Kitchen Co-Enzyme Q-10 100 MG CAPS Take 100 mg by mouth daily.  Marland Kitchen escitalopram (LEXAPRO) 10 MG tablet Take 10 mg by mouth daily.  . hydrochlorothiazide (HYDRODIURIL) 25 MG tablet Take 25 mg by mouth daily.  . Lutein-Zeaxanthin 25-5 MG CAPS Take 1 capsule by mouth daily.  . Memantine HCl-Donepezil HCl (NAMZARIC) 28-10 MG CP24 Take 1 capsule by mouth every evening.  . mirtazapine (REMERON) 30 MG tablet Take 30 mg by mouth at bedtime.  . Misc Natural Products (GLUCOSAMINE CHOND COMPLEX/MSM PO) Take  1 tablet by mouth 3 (three) times daily.   . Multiple Vitamin (MULTIVITAMIN) tablet Take 1 tablet by mouth daily.  . Omega-3 Fatty Acids (OMEGA 3 PO) Take 1 capsule by mouth 2 (two) times daily.   . polyethylene glycol (MIRALAX / GLYCOLAX) packet Take 17 g by mouth daily. With 8oz of water  . potassium chloride (K-DUR) 10 MEQ tablet Take 10 mEq by mouth daily.  . pravastatin (PRAVACHOL) 20 MG tablet Take 20 mg by mouth daily.  Marland Kitchen RAPAFLO 8 MG CAPS capsule Take 8 mg by mouth daily.  . saw palmetto 500 MG capsule Take 500 mg by mouth daily.  Marland Kitchen telmisartan (MICARDIS) 80 MG tablet Take 80 mg by mouth daily.  Marland Kitchen triamcinolone (NASACORT) 55 MCG/ACT AERO nasal inhaler Place 2 sprays into  the nose at bedtime.    Current inpatient meds:  Current Facility-Administered Medications  Medication Dose Route Frequency Provider Last Rate Last Dose  . acetaminophen (TYLENOL) tablet 650 mg  650 mg Oral Q6H PRN Samella Parr, NP   650 mg at 09/14/16 0930   Or  . acetaminophen (TYLENOL) suppository 650 mg  650 mg Rectal Q6H PRN Samella Parr, NP      . amLODipine (NORVASC) tablet 5 mg  5 mg Oral Daily Modena Jansky, MD   5 mg at 09/14/16 0930  . memantine (NAMENDA XR) 24 hr capsule 28 mg  28 mg Oral QPM Jonetta Osgood, MD   28 mg at 09/13/16 1708   And  . donepezil (ARICEPT) tablet 10 mg  10 mg Oral QPM Jonetta Osgood, MD   10 mg at 09/13/16 1708  . escitalopram (LEXAPRO) tablet 10 mg  10 mg Oral Daily Jonetta Osgood, MD   10 mg at 09/14/16 0930  . hydrALAZINE (APRESOLINE) injection 10 mg  10 mg Intravenous Q6H PRN Samella Parr, NP      . insulin aspart (novoLOG) injection 0-5 Units  0-5 Units Subcutaneous QHS Modena Jansky, MD   2 Units at 09/10/16 2214  . insulin aspart (novoLOG) injection 0-9 Units  0-9 Units Subcutaneous TID WC Modena Jansky, MD   1 Units at 09/14/16 0703  . irbesartan (AVAPRO) tablet 300 mg  300 mg Oral Daily Jonetta Osgood, MD   300 mg at 09/14/16 0930  .  mirtazapine (REMERON) tablet 7.5 mg  7.5 mg Oral QHS Modena Jansky, MD   7.5 mg at 09/13/16 2218  . mirtazapine (REMERON) tablet 7.5 mg  7.5 mg Oral QHS PRN Modena Jansky, MD      . polyvinyl alcohol (LIQUIFILM TEARS) 1.4 % ophthalmic solution 1 drop  1 drop Both Eyes PRN Modena Jansky, MD      . pravastatin (PRAVACHOL) tablet 20 mg  20 mg Oral q1800 Modena Jansky, MD   20 mg at 09/13/16 1708  . sodium chloride flush (NS) 0.9 % injection 3 mL  3 mL Intravenous Q12H Samella Parr, NP   3 mL at 09/14/16 0931  . tamsulosin (FLOMAX) capsule 0.4 mg  0.4 mg Oral Daily Modena Jansky, MD   0.4 mg at 09/14/16 0930  . triamcinolone (NASACORT) nasal inhaler 2 spray  2 spray Nasal QHS Modena Jansky, MD   2 spray at 09/13/16 2219    Allergies: No Known Allergies  ROS: As per HPI. A full 14-point review of systems cannot be obtained as the patient is unable to participate.   PE:  BP (!) 160/61 (BP Location: Left Arm)   Pulse 65   Temp 98.8 F (37.1 C) (Oral)   Resp 16   Ht 5' 8.5" (1.74 m)   Wt 84.3 kg (185 lb 13.6 oz)   SpO2 96%   BMI 27.85 kg/m   General: WDWN elderly Caucasian man lying in bed. He is alert and will track me about the bed. He mumbles incoherently throughout the visit. He does not follow any commands. The exam is thus limited to observation for the most part.  HEENT: Normocephalic. Sutures in place L ear with healing laceration. Neck supple without LAD. He would not open his mouth for me so I was unable to assess his oropharynx. Sclerae anicteric. No conjunctival injection.  CV: Regular, no murmur. Carotid pulses full and symmetric,  no bruits. Distal pulses 2+ and symmetric.  Lungs: CTAB on anterior exam.  Abdomen: Soft, obese, non-distended, no rebound or guarding. Bowel sounds present x4.  Extremities: No C/C/E. Mitt restraints in place B hands. Neuro:  CN: Pupils are equal and round. They are symmetrically reactive from 3-->2 mm. He blinks to visual threat  from all directions. Eyes are conjugate and he tracks me around his bed with grossly intact extraocular movements. No nystagmus. Corneals are intact. He has some widening of the L palpebral fissure at rest but has strong eye closure as he resists passive eye opening during the exam. The remainder of the cranial nerve exam is limited as he does not participate with the exam. Motor: Normal bulk. He has gegenhalten paratonia. He moves all four extremities equally with at least 4-/5 strength. He does not participate with confrontational strength testing. No tremor or other abnormal movements. No drift.  Sensation: He withdraws from mild noxious stimuli x4.  DTRs: 2+, brisker on the R than the L. Toes mute bilaterally. He has positive glabellar, bilateral palmomentals, a snout, and a R root reflex.  Coordination: He does not follow commands for testing but no overt dysmetric is noted with spontaneous movements.  Gait: Unable to assess as he does not participate with the exam.    Labs:  Lab Results  Component Value Date   WBC 10.5 09/12/2016   HGB 10.4 (L) 09/12/2016   HCT 31.0 (L) 09/12/2016   PLT 359 09/12/2016   GLUCOSE 129 (H) 09/12/2016   ALT 53 09/08/2016   AST 40 09/08/2016   NA 136 09/12/2016   K 3.4 (L) 09/12/2016   CL 104 09/12/2016   CREATININE 0.71 09/12/2016   BUN 17 09/12/2016   CO2 25 09/12/2016   INR 1.07 09/01/2016   HGBA1C 6.8 (H) 09/07/2016   NH3 11/25 19 Hgb a1c 6.8 UA 11/23 negative   Imaging:  I have personally and independently reviewed the [facility] without contrast from 09/10/16. This shows an acute to subacute subdural hematoma overlying the left parietal and occipital lobes extending medially along the tentorium. This results in effacement of the underlying sulci but no shift. There is a chronic-appearing subdural collection overlying the right frontal lobe without significant mass effect. A small amount of subarachnoid hemorrhage is seen in the left frontal lobe. There  is blood in the dependent portions of both lateral ventricles. There is moderate diffuse generalized atrophy with hydrocephalus ex-vacuo. Mild chronic small vessel changes are present in the bihemispheric white matter. This scan was directly compared to the prior scan from 09/07/16 and appears largely unchanged.   Other diagnostic studies:  EEG 09/10/16 showed low amplitude delta and theta slowing without epileptiform activity.   Assessment and Plan:  1. Delirium: This is acute, superimposed upon background dementia. Likely contributing factors include TBI, intracranial hemorrhage, medication effect (Keppra, steroids), prolonged hospitalization, transfers between facilities, impaired sleep, and metabolic derangements. Treatment is supportive at this point. Continue to optimize metabolic status as you are and treat any underlying infections should they arise. Minimize CNS active medications, paying particular attention to the avoidance of opiates, benzos and anything with strong anticholinergic properties. Continue to try to regular circadian rhythms, keeping the room bright and active by day, dark and quiet by night. May consider a low dose of an atypical neuroleptic such as olanzapine 2.5 mg BID.   His baseline dementia predisposes him to delirium from all causes and predicts delayed recovery from same. Unfortunately, it is unlikely that  he will return to his premorbid level of function. However, it cannot be predicted how much recovery he can have at this time as recovery in setting such as this occurs over weeks and even months before a new baseline is established.   2. 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 There is likely a postconcussive element to the injury this is contributing to his delirium. Treatment is supportive.   3. Subdural hematoma: He has acute L parietal/tentorial and chronic R  frontal subdurals. These are non-operative. Continue supportive care. Plavix remains on hold. Generally antiplatelet agents should be held for 10-14 days after an acute intracranial hemorrhage, after which must weigh risk versus benefit before restarting them.   4. Subarachnoid hemorrhage: This is acute, due to TBI. No intervention necessary, continue supportive care. Hold Plavix as above.   5. Intraventricular hemorrhage: this is acute, due to TBI. Supportive care. Hold Plavix as above.  This was discussed at length with the patient's wife at the bedside. She understands that his recovery will be extended and that the extent of such recovery cannot be accurately predicted. She also understands that he is not likely to return to his prior baseline. She is in agreement with the plan as stated. She was given the opportunity to ask questions and these were addressed to her satisfaction.   A total of 90 minutes was spent on this consultation, including face-to-face time with patient, long discussion with his wife, detailed review of the medical record, direct independent review of neuroimaging, and direct review of labs.

## 2016-09-14 NOTE — Progress Notes (Signed)
Physical Therapy Treatment Patient Details Name: Kenneth Stark MRN: [id number] DOB: Jan 31, 1925 Today's Date: 09/14/2016    History of Present Illness Kenneth Petri Collinsis a 82 y.o.malewith medical history significant for TIAs on Plavix, hypertension, anemia, dyslipidemia and essential tremor. Patient was recently discharged to [facility] skilled nurse facility on 11/19 after an admission for subdural hematoma/subarachnoid hemorrhage after a fall while taking Plavix.   Now, pt presents with altered mentation of unclear etiology.    PT Comments    Pt lying in bed on arrival.  Pt slow to process commands but able to transfer into stedy frame and out out frame to chair.  Pt remains rigid and required significant assistance to achieve standing.  Will continue to recommend SNF placement to improve strength and functional mobility before returning home.  Will continue to follow during acute hospitalization.      Follow Up Recommendations  SNF     Equipment Recommendations  3in1 (PT)    Recommendations for Other Services       Precautions / Restrictions Precautions Precautions: Fall Restrictions Weight Bearing Restrictions: No    Mobility  Bed Mobility Overal bed mobility: Needs Assistance Bed Mobility: Supine to Sit     Supine to sit: Total assist;+2 for safety/equipment     General bed mobility comments: Assist with LEs and to elevate trunk to get to EOB; grimacing in pain.  Transfers Overall transfer level: Needs assistance Equipment used: None   Sit to Stand: Max assist;+2 physical assistance;From elevated surface         General transfer comment: PTA and SPTA used bed pad to elevate into standing.  Pt able to forward weight shift but remains rigid.  Pt performed x5 repeated STS from stedy plates (higher seated surface) with +2 min assist.    Ambulation/Gait Ambulation/Gait assistance:  (remains unable.  )               Stairs             Wheelchair Mobility    Modified Rankin (Stroke Patients Only)       Balance Overall balance assessment: Needs assistance   Sitting balance-Leahy Scale: Poor Sitting balance - Comments: pushing posteriorly required mod assistance to maintain.       Standing balance-Leahy Scale: Poor Standing balance comment: Heavy reliance on stedy cross bar for support to pull into standing.                      Cognition Arousal/Alertness: Awake/alert Behavior During Therapy: WFL for tasks assessed/performed Overall Cognitive Status: Impaired/Different from baseline Area of Impairment: Safety/judgement;Problem solving;Orientation;Attention;Following commands;Awareness Orientation Level: Disoriented to;Place;Time;Situation Current Attention Level: Focused Memory: Decreased short-term memory Following Commands: Follows one step commands with increased time;Follows one step commands inconsistently Safety/Judgement: Decreased awareness of safety;Decreased awareness of deficits Awareness: Intellectual Problem Solving: Slow processing General Comments: Remains difficult to assess, mumbles unintelligibly.      Exercises      General Comments        Pertinent Vitals/Pain Pain Assessment: Faces Faces Pain Scale: Hurts even more Pain Location: grimacing with movement.   Pain Descriptors / Indicators: Grimacing Pain Intervention(s): Monitored during session;Repositioned    Home Living                      Prior Function            PT Goals (current goals can now be found in the care plan section) Acute Rehab  PT Goals Patient Stated Goal: pt unable Potential to Achieve Goals: Fair Progress towards PT goals: Progressing toward goals    Frequency    Min 2X/week      PT Plan Current plan remains appropriate    Co-evaluation             End of Session   Activity Tolerance: Patient tolerated treatment well Patient left: in chair;with call bell/phone  within reach;with chair alarm set;with family/visitor present     Time: 1701-1716 PT Time Calculation (min) (ACUTE ONLY): 15 min  Charges:  $Therapeutic Activity: 8-22 mins                    G Codes:      Kenneth Stark 2016/10/12, 5:27 PM  Kenneth Stark, PTA pager [phone]

## 2016-09-15 LAB — BASIC METABOLIC PANEL
ANION GAP: 11 (ref 5–15)
BUN: 15 mg/dL (ref 6–20)
CALCIUM: 9.1 mg/dL (ref 8.9–10.3)
CO2: 24 mmol/L (ref 22–32)
CREATININE: 0.87 mg/dL (ref 0.61–1.24)
Chloride: 100 mmol/L — ABNORMAL LOW (ref 101–111)
GFR calc non Af Amer: >60 mL/min (ref >60)
Glucose, Bld: 138 mg/dL — ABNORMAL HIGH (ref 65–99)
Potassium: 3.6 mmol/L (ref 3.5–5.1)
SODIUM: 135 mmol/L (ref 135–145)

## 2016-09-15 LAB — GLUCOSE, CAPILLARY
GLUCOSE-CAPILLARY: 132 mg/dL — AB (ref 65–99)
GLUCOSE-CAPILLARY: 187 mg/dL — AB (ref 65–99)
GLUCOSE-CAPILLARY: 86 mg/dL (ref 65–99)

## 2016-09-15 MED ORDER — AMLODIPINE BESYLATE 10 MG PO TABS: 5.0000 mg | ORAL_TABLET | Freq: Every day | ORAL | Status: DC

## 2016-09-15 NOTE — Progress Notes (Signed)
Sutures removed from patient's left ear per order. Tolerated well with wife at bedside.

## 2016-09-15 NOTE — Discharge Summary (Signed)
PATIENT DETAILS Name: Kenneth Stark Age: 82 y.o. Sex: male Date of Birth: [birth year] MRN: [id number]. Admitting Physician: Waldemar Dickens, MD [id number] A, MD  Admit Date: 09/07/2016 Discharge date: 09/15/2016  Recommendations for Outpatient Follow-up:  1. Follow up with PCP in 1-2 weeks 2. Please obtain BMP/CBC in one week 3. Please consider palliative care involvement over the next few weeks if no improvement in mentation. 4. Resume antiplatelet agents at the discretion of neurology/neurosurgery  Admitted From:  SNF  Disposition: SNF   Home Health: No  Equipment/Devices: None  Discharge Condition: Stable  CODE STATUS: FULL CODE  Diet recommendation:  Heart Healthy   Brief Summary: See H&P, Labs, Consult and Test reports for all details in brief, Patient is a 82 y.o. male with history of mild dementia, recent subdural/subarachnoid hematoma following a mechanical fall admitted for altered mental status.  Brief Hospital Course: Acute encephalopathy: Unfortunately, continues to have altered mental status which at times waxes and wanes but is mostly persistent throughout this hospital stay. Suspect this is multifactorial with recent traumatic brain injury from a fall causing SAH/SDH, suspected delirium associated with dementia. Since no evidence of seizures and EEG negative Keppra was stopped-specialty given concern for lethargy and sedation. Neurology was formally consulted, recommendations are to continue with supportive care-and to minimize CNS active medications, especially opiates, benzos and anything with strong anticholinergic properties. Continue to try to regulate circadian rhythms, keeping the room bright and active by day, dark and quiet by night. May consider a low dose of an atypical neuroleptic such as olanzapine 2.5 mg BID if no improvement over the next few days. Long discussion with spouse over the past few days, she is aware that patient may  never recovered to his prior level of functioning.  Recent subdural/subarachnoid hemorrhage: Repeat CT scan earlier this admission demonstrated stability. Neurosurgery following. Antiplatelets remain on hold-could consider resumption over the next few weeks if cleared by neurosurgery.  Hypokalemia: Repleted   Acute hypoxic respiratory failure: Resolved, suspect this was from atelectasis. Now on room air.  History of TIA: Hold antiplatelets due to recent SDH/SAH  History of essential tremor: Stable at this time-do not see any obvious tremor  Hypertension: Blood pressure better controlled, continue amlodipine, and telmisartan. Have discontinued HCTZ for now-continue to monitor in the outpatient setting and optimize accordingly.   Dementia: Continue Namenda and Aricept.  BPH: Continue Rapaflo  Dyslipidemia: Continue statin  History of insomnia: Continue Remeron  Status post recent fall with SAH/SDH and left ear laceration: Dr.Arrien-spoke with ENT-Dr. Teoh-recommendations were to remove sutures 48 hours from 11/28-have asked RN to remove sutures prior to discharge back to SNF today.  Procedures/Studies: None  Discharge Diagnoses:  Principal Problem:   Altered mental status Active Problems:   Subdural hematoma (HCC)   Essential hypertension   Dyslipidemia   Essential tremor   History of TIA (transient ischemic attack)   Subarachnoid hemorrhage (HCC)   Acute hyperglycemia   Dehydration   Transaminitis   Anemia   Leukocytosis   Acute respiratory failure with hypoxia [facility])   Discharge Instructions: Activity:  As tolerated with Full fall precautions use walker/cane & assistance as needed   Discharge Instructions    Call MD for:  extreme fatigue    Complete by:  As directed    Call MD for:  persistant dizziness or light-headedness    Complete by:  As directed    Diet - low sodium heart healthy    Complete by:  As  directed    Increase activity slowly     Complete by:  As directed        Medication List    STOP taking these medications   aspirin 81 MG tablet   clopidogrel 75 MG tablet Commonly known as:  PLAVIX   hydrochlorothiazide 25 MG tablet Commonly known as:  HYDRODIURIL     TAKE these medications   acetaminophen 325 MG tablet Commonly known as:  TYLENOL Take 2 tablets (650 mg total) by mouth every 6 (six) hours as needed for mild pain (or Fever >/= 101).   amLODipine 10 MG tablet Commonly known as:  NORVASC Take 0.5 tablets (5 mg total) by mouth daily. What changed:  how much to take   CALTRATE 600+D PO Take 600 mg by mouth 3 (three) times daily.   Co-Enzyme Q-10 100 MG Caps Take 100 mg by mouth daily.   escitalopram 10 MG tablet Commonly known as:  LEXAPRO Take 10 mg by mouth daily.   EYE LUBRICANT OP Place 1 drop into both eyes daily.   GLUCOSAMINE CHOND COMPLEX/MSM PO Take 1 tablet by mouth 3 (three) times daily.   Lutein-Zeaxanthin 25-5 MG Caps Take 1 capsule by mouth daily.   mirtazapine 30 MG tablet Commonly known as:  REMERON Take 30 mg by mouth at bedtime.   multivitamin tablet Take 1 tablet by mouth daily.   NAMZARIC 28-10 MG Cp24 Generic drug:  Memantine HCl-Donepezil HCl Take 1 capsule by mouth every evening.   OMEGA 3 PO Take 1 capsule by mouth 2 (two) times daily.   polyethylene glycol packet Commonly known as:  MIRALAX / GLYCOLAX Take 17 g by mouth daily. With 8oz of water   potassium chloride 10 MEQ tablet Commonly known as:  K-DUR Take 10 mEq by mouth daily.   pravastatin 20 MG tablet Commonly known as:  PRAVACHOL Take 20 mg by mouth daily.   RAPAFLO 8 MG Caps capsule Generic drug:  silodosin Take 8 mg by mouth daily.   saw palmetto 500 MG capsule Take 500 mg by mouth daily.   telmisartan 80 MG tablet Commonly known as:  MICARDIS Take 80 mg by mouth daily.   triamcinolone 55 MCG/ACT Aero nasal inhaler Commonly known as:  NASACORT Place 2 sprays into the nose at  bedtime.   vitamin C 1000 MG tablet Take 1,000 mg by mouth 3 (three) times daily.      Follow-up Information    Ascencion Dike, MD Follow up in 2 day(s).   Specialty:  Otolaryngology Why:  suture removal Contact information: [address] [place] [address] [phone]        ARONSON,RICHARD A, MD. Schedule an appointment as soon as possible for a visit in 1 week(s).   Specialty:  Internal Medicine Contact information: [place] [address] [phone]        Kevan Ny Ditty, MD. Schedule an appointment as soon as possible for a visit in 2 week(s).   Specialty:  Neurosurgery Contact information: [place] [address] [phone]          No Known Allergies  Consultations:   neurology and Neurosurgery  Other Procedures/Studies: Ct Head Wo Contrast  Result Date: 09/10/2016 CLINICAL DATA:  82 year old male with intracranial hemorrhage after a fall earlier this month. Initial encounter. EXAM: CT HEAD WITHOUT CONTRAST TECHNIQUE: Contiguous axial images were obtained from the base of the skull through the vertex without intravenous contrast. COMPARISON:  Head CT without contrast 09/07/2016 and earlier. FINDINGS: Brain: Mixed density right subdural hematoma appears stable ranging in thickness from 6-8 mm along much of the left convexity. Small volume hyperdense blood layering on the left tentorium is stable. Suspicion of superimposed left frontal operculum hemorrhagic contusion on series 4, image 40, stable. Low-density right subdural hematoma or hygroma measures 5 mm in thickness and is stable since 09/07/2016. Trace residual subarachnoid hemorrhage along the left sylvian fissure and posterior right convexity. Small volume layering intraventricular hemorrhage in the occipital horns is stable. No ventriculomegaly. No midline shift. Stable gray-white matter differentiation throughout the brain. No cortically based  acute infarct identified. Vascular: Calcified atherosclerosis at the skull base. Skull: No acute osseous abnormality identified. Sinuses/Orbits: Visualized paranasal sinuses and mastoids are stable and well pneumatized. Other: Mild generalized scalp soft tissue edema. Negative orbits soft tissues. IMPRESSION: 1. Mixed density left and low-density right subdural hematomas are stable since 09/07/2016. No midline shift. 2. Stable small volume residual subarachnoid hemorrhage. Possible small associated left frontal operculum hemorrhagic contusion which is stable. 3. Stable small volume intraventricular hemorrhage with no ventriculomegaly. 4. No new intracranial abnormality. Electronically Signed   By: Genevie Ann M.D.   On: 09/10/2016 10:52   Ct Head Wo Contrast  Result Date: 09/07/2016 CLINICAL DATA:  Altered mental status.  Increased lethargy. EXAM: CT HEAD WITHOUT CONTRAST TECHNIQUE: Contiguous axial images were obtained from the base of the skull through the vertex without intravenous contrast. COMPARISON:  CT head without contrast 09/02/2016 and 09/01/2016. FINDINGS: Brain: There is expected evolution of left subdural and subarachnoid hemorrhage. The size of the subdural component has not changed significantly. There is mixed density compatible with evolution of blood products. Additional subarachnoid and subdural blood layers along the tentorium, left greater than right. Intraventricular hemorrhage is more prominent than on the prior exam. Minimal left to right midline shift is present. There is some local mass effect on the left with crowding of the sulci and partial effacement of the left lateral ventricle. No definite new areas of hemorrhage are present. There is slight dilation of the right lateral ventricle without hydrocephalus. Vascular: Extensive atherosclerotic calcifications are present within the cavernous internal carotid arteries. There is no hyperdense vessel. Skull: The calvarium is intact.  Sinuses/Orbits: The paranasal sinuses and mastoid air cells are clear. IMPRESSION: 1. Evolving subdural and subarachnoid blood products, left greater than right. 2. Increased prominence of intraventricular hemorrhage, likely reflecting layering blood from the previous hemorrhage. 3. No definite acute hemorrhage. 4. Left-sided mass effect without significant midline shift. Electronically Signed   By: San Morelle M.D.   On: 09/07/2016 13:13   Ct Head Wo Contrast  Result Date: 09/02/2016 CLINICAL DATA:  Subdural hemorrhage after fall EXAM: CT HEAD WITHOUT CONTRAST TECHNIQUE: Contiguous axial images were obtained from the base of the skull through the vertex without intravenous contrast. COMPARISON:  Head CT 09/01/2016 FINDINGS: Brain: Subdural and subarachnoid blood over the left convexity is unchanged, with the subdural component measuring 7 mm in greatest thickness. There is persistent blood layering along the left tentorial leaflet. There is now intraventricular extension of hemorrhage without hydrocephalus. No new mass effect or midline shift. Basal cisterns remain patent. There is no new area of hemorrhage. Vascular: No hyperdense vessel or unexpected calcification. Skull: Normal visualized skull base and calvarium. Decreased left scalp soft tissue swelling. Sinuses/Orbits: No sinus fluid levels or advanced mucosal thickening. No mastoid effusion. Normal orbits. IMPRESSION: 1. Unchanged appearance of left convexity subdural and subarachnoid blood.  2. Development of intraventricular extension of hemorrhage with blood layering in the occipital horns of the lateral ventricles. No hydrocephalus. 3. No new area of hemorrhage, new mass effect or midline shift. Electronically Signed   By: Ulyses Jarred M.D.   On: 09/02/2016 05:57   Ct Head Wo Contrast  Result Date: 09/01/2016 CLINICAL DATA:  Fall, complex left ear laceration EXAM: CT HEAD WITHOUT CONTRAST CT CERVICAL SPINE WITHOUT CONTRAST TECHNIQUE:  Multidetector CT imaging of the head and cervical spine was performed following the standard protocol without intravenous contrast. Multiplanar CT image reconstructions of the cervical spine were also generated. COMPARISON:  MRI brain dated 09/30/2014 FINDINGS: CT HEAD FINDINGS Brain: Subdural hematoma along the left frontal convexity measuring up to 8 mm in thickness (series 2/image 21). Underlying subarachnoid hemorrhage along the left frontoparietal region and extending into the sylvian fissure (series 2/image 16). Associated subdural/subarachnoid hemorrhage extending along the anterior left temporal lobe in the middle cranial fossa (series 2/image 10). Mild subdural hematoma along the left tentorium (series 2/image 16). No evidence of acute infarction, hydrocephalus, or mass lesion/mass effect. No intraventricular hemorrhage. Basal cisterns remain patent. Global cortical atrophy.  Secondary ventricular prominence. Mild subcortical white matter and periventricular small vessel ischemic changes. Intracranial atherosclerosis. Vascular: No hyperdense vessel or unexpected calcification. Skull: Normal. Negative for fracture or focal lesion. Sinuses/Orbits: The visualized paranasal sinuses are essentially clear. The mastoid air cells are unopacified. Other: Soft tissue laceration involving the left ear (series 2/ image 10). Overlying dressing. CT CERVICAL SPINE FINDINGS Alignment: Normal. Skull base and vertebrae: No acute fracture. No primary bone lesion or focal pathologic process. Soft tissues and spinal canal: No prevertebral fluid or swelling. No visible canal hematoma. Disc levels: Mild degenerative changes of the mid cervical spine. Spinal canal remains patent. Upper chest: Visualized lung apices are clear. Other: Visualized thyroid is unremarkable. IMPRESSION: Left frontal subdural hematoma measuring up to 8 mm. Additional subdural/subarachnoid hemorrhage on the left, as described above. No midline shift. Basal  cisterns remain patent. No intraventricular hemorrhage. No evidence of traumatic injury to the cervical spine. Critical Value/emergent results were called by telephone at the time of interpretation on 09/01/2016 at 12:03 pm to [facility], who verbally acknowledged these results. Electronically Signed   By: Julian Hy M.D.   On: 09/01/2016 12:04   Ct Cervical Spine Wo Contrast  Result Date: 09/01/2016 CLINICAL DATA:  Fall, complex left ear laceration EXAM: CT HEAD WITHOUT CONTRAST CT CERVICAL SPINE WITHOUT CONTRAST TECHNIQUE: Multidetector CT imaging of the head and cervical spine was performed following the standard protocol without intravenous contrast. Multiplanar CT image reconstructions of the cervical spine were also generated. COMPARISON:  MRI brain dated 09/30/2014 FINDINGS: CT HEAD FINDINGS Brain: Subdural hematoma along the left frontal convexity measuring up to 8 mm in thickness (series 2/image 21). Underlying subarachnoid hemorrhage along the left frontoparietal region and extending into the sylvian fissure (series 2/image 16). Associated subdural/subarachnoid hemorrhage extending along the anterior left temporal lobe in the middle cranial fossa (series 2/image 10). Mild subdural hematoma along the left tentorium (series 2/image 16). No evidence of acute infarction, hydrocephalus, or mass lesion/mass effect. No intraventricular hemorrhage. Basal cisterns remain patent. Global cortical atrophy.  Secondary ventricular prominence. Mild subcortical white matter and periventricular small vessel ischemic changes. Intracranial atherosclerosis. Vascular: No hyperdense vessel or unexpected calcification. Skull: Normal. Negative for fracture or focal lesion. Sinuses/Orbits: The visualized paranasal sinuses are essentially clear. The mastoid air cells are unopacified. Other: Soft tissue laceration involving the left  ear (series 2/ image 10). Overlying dressing. CT CERVICAL SPINE FINDINGS Alignment:  Normal. Skull base and vertebrae: No acute fracture. No primary bone lesion or focal pathologic process. Soft tissues and spinal canal: No prevertebral fluid or swelling. No visible canal hematoma. Disc levels: Mild degenerative changes of the mid cervical spine. Spinal canal remains patent. Upper chest: Visualized lung apices are clear. Other: Visualized thyroid is unremarkable. IMPRESSION: Left frontal subdural hematoma measuring up to 8 mm. Additional subdural/subarachnoid hemorrhage on the left, as described above. No midline shift. Basal cisterns remain patent. No intraventricular hemorrhage. No evidence of traumatic injury to the cervical spine. Critical Value/emergent results were called by telephone at the time of interpretation on 09/01/2016 at 12:03 pm to [facility], who verbally acknowledged these results. Electronically Signed   By: Julian Hy M.D.   On: 09/01/2016 12:04   Dg Chest Port 1 View  Result Date: 09/08/2016 CLINICAL DATA:  Hypoxia, history hypertension, small vessel disease, essential tremor EXAM: PORTABLE CHEST 1 VIEW COMPARISON:  Portable exam 1440 hours compared to 09/07/2016 FINDINGS: Upper normal size of cardiac silhouette. Mediastinal contours and pulmonary vascularity normal. Atherosclerotic calcifications aorta. Bibasilar atelectasis. Lungs otherwise clear. No pleural effusion or pneumothorax. Old healed RIGHT rib fractures noted. IMPRESSION: Bibasilar atelectasis. Electronically Signed   By: Lavonia Dana M.D.   On: 09/08/2016 14:47   Dg Chest Port 1 View  Result Date: 09/07/2016 CLINICAL DATA:  Fever.  Acute mental status change. EXAM: PORTABLE CHEST 1 VIEW COMPARISON:  January 15, 2007 FINDINGS: The heart, hila, and mediastinum are unchanged. No pneumothorax. Mild atelectasis in the left base. A small left effusion is not excluded. IMPRESSION: Mild atelectasis in the left base. A small left effusion is not excluded. Electronically Signed   By: Dorise Bullion III M.D    On: 09/07/2016 14:22     TODAY-DAY OF DISCHARGE:  Subjective:   Antionette Poles today continues to be confused Objective:   Blood pressure (!) 139/56, pulse 63, temperature 99 F (37.2 C), temperature source Oral, resp. rate 18, height 5' 8.5" (1.74 m), weight 79.3 kg (174 lb 13.2 oz), SpO2 96 %.  Intake/Output Summary (Last 24 hours) at 09/15/16 0945 Last data filed at 09/15/16 0507  Gross per 24 hour  Intake                3 ml  Output             1850 ml  Net            -1847 ml   Filed Weights   09/13/16 0450 09/14/16 0456 09/15/16 0506  Weight: 84.3 kg (185 lb 13.6 oz) 84.3 kg (185 lb 13.6 oz) 79.3 kg (174 lb 13.2 oz)    Exam: Awake Alert, Oriented *3, No new F.N deficits, Normal affect [place].AT,PERRAL Supple Neck,No JVD, No cervical lymphadenopathy appriciated.  Symmetrical Chest wall movement, Good air movement bilaterally, CTAB RRR,No Gallops,Rubs or new Murmurs, No Parasternal Heave +ve B.Sounds, Abd Soft, Non tender, No organomegaly appriciated, No rebound -guarding or rigidity. No Cyanosis, Clubbing or edema, No new Rash or bruise   PERTINENT RADIOLOGIC STUDIES: Ct Head Wo Contrast  Result Date: 09/10/2016 CLINICAL DATA:  82 year old male with intracranial hemorrhage after a fall earlier this month. Initial encounter. EXAM: CT HEAD WITHOUT CONTRAST TECHNIQUE: Contiguous axial images were obtained from the base of the skull through the vertex without intravenous contrast. COMPARISON:  Head CT without contrast 09/07/2016 and earlier. FINDINGS: Brain: Mixed density right subdural  hematoma appears stable ranging in thickness from 6-8 mm along much of the left convexity. Small volume hyperdense blood layering on the left tentorium is stable. Suspicion of superimposed left frontal operculum hemorrhagic contusion on series 4, image 40, stable. Low-density right subdural hematoma or hygroma measures 5 mm in thickness and is stable since 09/07/2016. Trace residual subarachnoid  hemorrhage along the left sylvian fissure and posterior right convexity. Small volume layering intraventricular hemorrhage in the occipital horns is stable. No ventriculomegaly. No midline shift. Stable gray-white matter differentiation throughout the brain. No cortically based acute infarct identified. Vascular: Calcified atherosclerosis at the skull base. Skull: No acute osseous abnormality identified. Sinuses/Orbits: Visualized paranasal sinuses and mastoids are stable and well pneumatized. Other: Mild generalized scalp soft tissue edema. Negative orbits soft tissues. IMPRESSION: 1. Mixed density left and low-density right subdural hematomas are stable since 09/07/2016. No midline shift. 2. Stable small volume residual subarachnoid hemorrhage. Possible small associated left frontal operculum hemorrhagic contusion which is stable. 3. Stable small volume intraventricular hemorrhage with no ventriculomegaly. 4. No new intracranial abnormality. Electronically Signed   By: Genevie Ann M.D.   On: 09/10/2016 10:52   Ct Head Wo Contrast  Result Date: 09/07/2016 CLINICAL DATA:  Altered mental status.  Increased lethargy. EXAM: CT HEAD WITHOUT CONTRAST TECHNIQUE: Contiguous axial images were obtained from the base of the skull through the vertex without intravenous contrast. COMPARISON:  CT head without contrast 09/02/2016 and 09/01/2016. FINDINGS: Brain: There is expected evolution of left subdural and subarachnoid hemorrhage. The size of the subdural component has not changed significantly. There is mixed density compatible with evolution of blood products. Additional subarachnoid and subdural blood layers along the tentorium, left greater than right. Intraventricular hemorrhage is more prominent than on the prior exam. Minimal left to right midline shift is present. There is some local mass effect on the left with crowding of the sulci and partial effacement of the left lateral ventricle. No definite new areas of  hemorrhage are present. There is slight dilation of the right lateral ventricle without hydrocephalus. Vascular: Extensive atherosclerotic calcifications are present within the cavernous internal carotid arteries. There is no hyperdense vessel. Skull: The calvarium is intact. Sinuses/Orbits: The paranasal sinuses and mastoid air cells are clear. IMPRESSION: 1. Evolving subdural and subarachnoid blood products, left greater than right. 2. Increased prominence of intraventricular hemorrhage, likely reflecting layering blood from the previous hemorrhage. 3. No definite acute hemorrhage. 4. Left-sided mass effect without significant midline shift. Electronically Signed   By: San Morelle M.D.   On: 09/07/2016 13:13   Ct Head Wo Contrast  Result Date: 09/02/2016 CLINICAL DATA:  Subdural hemorrhage after fall EXAM: CT HEAD WITHOUT CONTRAST TECHNIQUE: Contiguous axial images were obtained from the base of the skull through the vertex without intravenous contrast. COMPARISON:  Head CT 09/01/2016 FINDINGS: Brain: Subdural and subarachnoid blood over the left convexity is unchanged, with the subdural component measuring 7 mm in greatest thickness. There is persistent blood layering along the left tentorial leaflet. There is now intraventricular extension of hemorrhage without hydrocephalus. No new mass effect or midline shift. Basal cisterns remain patent. There is no new area of hemorrhage. Vascular: No hyperdense vessel or unexpected calcification. Skull: Normal visualized skull base and calvarium. Decreased left scalp soft tissue swelling. Sinuses/Orbits: No sinus fluid levels or advanced mucosal thickening. No mastoid effusion. Normal orbits. IMPRESSION: 1. Unchanged appearance of left convexity subdural and subarachnoid blood. 2. Development of intraventricular extension of hemorrhage with blood layering in the occipital  horns of the lateral ventricles. No hydrocephalus. 3. No new area of hemorrhage, new  mass effect or midline shift. Electronically Signed   By: Ulyses Jarred M.D.   On: 09/02/2016 05:57   Ct Head Wo Contrast  Result Date: 09/01/2016 CLINICAL DATA:  Fall, complex left ear laceration EXAM: CT HEAD WITHOUT CONTRAST CT CERVICAL SPINE WITHOUT CONTRAST TECHNIQUE: Multidetector CT imaging of the head and cervical spine was performed following the standard protocol without intravenous contrast. Multiplanar CT image reconstructions of the cervical spine were also generated. COMPARISON:  MRI brain dated 09/30/2014 FINDINGS: CT HEAD FINDINGS Brain: Subdural hematoma along the left frontal convexity measuring up to 8 mm in thickness (series 2/image 21). Underlying subarachnoid hemorrhage along the left frontoparietal region and extending into the sylvian fissure (series 2/image 16). Associated subdural/subarachnoid hemorrhage extending along the anterior left temporal lobe in the middle cranial fossa (series 2/image 10). Mild subdural hematoma along the left tentorium (series 2/image 16). No evidence of acute infarction, hydrocephalus, or mass lesion/mass effect. No intraventricular hemorrhage. Basal cisterns remain patent. Global cortical atrophy.  Secondary ventricular prominence. Mild subcortical white matter and periventricular small vessel ischemic changes. Intracranial atherosclerosis. Vascular: No hyperdense vessel or unexpected calcification. Skull: Normal. Negative for fracture or focal lesion. Sinuses/Orbits: The visualized paranasal sinuses are essentially clear. The mastoid air cells are unopacified. Other: Soft tissue laceration involving the left ear (series 2/ image 10). Overlying dressing. CT CERVICAL SPINE FINDINGS Alignment: Normal. Skull base and vertebrae: No acute fracture. No primary bone lesion or focal pathologic process. Soft tissues and spinal canal: No prevertebral fluid or swelling. No visible canal hematoma. Disc levels: Mild degenerative changes of the mid cervical spine.  Spinal canal remains patent. Upper chest: Visualized lung apices are clear. Other: Visualized thyroid is unremarkable. IMPRESSION: Left frontal subdural hematoma measuring up to 8 mm. Additional subdural/subarachnoid hemorrhage on the left, as described above. No midline shift. Basal cisterns remain patent. No intraventricular hemorrhage. No evidence of traumatic injury to the cervical spine. Critical Value/emergent results were called by telephone at the time of interpretation on 09/01/2016 at 12:03 pm to [facility], who verbally acknowledged these results. Electronically Signed   By: Julian Hy M.D.   On: 09/01/2016 12:04   Ct Cervical Spine Wo Contrast  Result Date: 09/01/2016 CLINICAL DATA:  Fall, complex left ear laceration EXAM: CT HEAD WITHOUT CONTRAST CT CERVICAL SPINE WITHOUT CONTRAST TECHNIQUE: Multidetector CT imaging of the head and cervical spine was performed following the standard protocol without intravenous contrast. Multiplanar CT image reconstructions of the cervical spine were also generated. COMPARISON:  MRI brain dated 09/30/2014 FINDINGS: CT HEAD FINDINGS Brain: Subdural hematoma along the left frontal convexity measuring up to 8 mm in thickness (series 2/image 21). Underlying subarachnoid hemorrhage along the left frontoparietal region and extending into the sylvian fissure (series 2/image 16). Associated subdural/subarachnoid hemorrhage extending along the anterior left temporal lobe in the middle cranial fossa (series 2/image 10). Mild subdural hematoma along the left tentorium (series 2/image 16). No evidence of acute infarction, hydrocephalus, or mass lesion/mass effect. No intraventricular hemorrhage. Basal cisterns remain patent. Global cortical atrophy.  Secondary ventricular prominence. Mild subcortical white matter and periventricular small vessel ischemic changes. Intracranial atherosclerosis. Vascular: No hyperdense vessel or unexpected calcification. Skull: Normal.  Negative for fracture or focal lesion. Sinuses/Orbits: The visualized paranasal sinuses are essentially clear. The mastoid air cells are unopacified. Other: Soft tissue laceration involving the left ear (series 2/ image 10). Overlying dressing. CT CERVICAL SPINE FINDINGS Alignment:  Normal. Skull base and vertebrae: No acute fracture. No primary bone lesion or focal pathologic process. Soft tissues and spinal canal: No prevertebral fluid or swelling. No visible canal hematoma. Disc levels: Mild degenerative changes of the mid cervical spine. Spinal canal remains patent. Upper chest: Visualized lung apices are clear. Other: Visualized thyroid is unremarkable. IMPRESSION: Left frontal subdural hematoma measuring up to 8 mm. Additional subdural/subarachnoid hemorrhage on the left, as described above. No midline shift. Basal cisterns remain patent. No intraventricular hemorrhage. No evidence of traumatic injury to the cervical spine. Critical Value/emergent results were called by telephone at the time of interpretation on 09/01/2016 at 12:03 pm to [facility], who verbally acknowledged these results. Electronically Signed   By: Julian Hy M.D.   On: 09/01/2016 12:04   Dg Chest Port 1 View  Result Date: 09/08/2016 CLINICAL DATA:  Hypoxia, history hypertension, small vessel disease, essential tremor EXAM: PORTABLE CHEST 1 VIEW COMPARISON:  Portable exam 1440 hours compared to 09/07/2016 FINDINGS: Upper normal size of cardiac silhouette. Mediastinal contours and pulmonary vascularity normal. Atherosclerotic calcifications aorta. Bibasilar atelectasis. Lungs otherwise clear. No pleural effusion or pneumothorax. Old healed RIGHT rib fractures noted. IMPRESSION: Bibasilar atelectasis. Electronically Signed   By: Lavonia Dana M.D.   On: 09/08/2016 14:47   Dg Chest Port 1 View  Result Date: 09/07/2016 CLINICAL DATA:  Fever.  Acute mental status change. EXAM: PORTABLE CHEST 1 VIEW COMPARISON:  January 15, 2007  FINDINGS: The heart, hila, and mediastinum are unchanged. No pneumothorax. Mild atelectasis in the left base. A small left effusion is not excluded. IMPRESSION: Mild atelectasis in the left base. A small left effusion is not excluded. Electronically Signed   By: Dorise Bullion III M.D   On: 09/07/2016 14:22     PERTINENT LAB RESULTS: CBC: No results for input(s): WBC, HGB, HCT, PLT in the last 72 hours. CMET CMP     Component Value Date/Time   NA 135 09/15/2016 0555   NA 141 09/04/2016 0124   K 3.6 09/15/2016 0555   CL 100 (L) 09/15/2016 0555   CO2 24 09/15/2016 0555   GLUCOSE 138 (H) 09/15/2016 0555   BUN 15 09/15/2016 0555   BUN 22 (A) 09/04/2016 0124   CREATININE 0.87 09/15/2016 0555   CALCIUM 9.1 09/15/2016 0555   PROT 5.3 (L) 09/08/2016 0603   ALBUMIN 2.6 (L) 09/08/2016 0603   AST 40 09/08/2016 0603   ALT 53 09/08/2016 0603   ALKPHOS 44 09/08/2016 0603   BILITOT 0.5 09/08/2016 0603   GFRNONAA >60 09/15/2016 0555   GFRAA >60 09/15/2016 0555    GFR Estimated Creatinine Clearance: 57.8 mL/min (by C-G formula based on SCr of 0.87 mg/dL). No results for input(s): LIPASE, AMYLASE in the last 72 hours. No results for input(s): CKTOTAL, CKMB, CKMBINDEX, TROPONINI in the last 72 hours. Invalid input(s): POCBNP No results for input(s): DDIMER in the last 72 hours. No results for input(s): HGBA1C in the last 72 hours. No results for input(s): CHOL, HDL, LDLCALC, TRIG, CHOLHDL, LDLDIRECT in the last 72 hours. No results for input(s): TSH, T4TOTAL, T3FREE, THYROIDAB in the last 72 hours.  Invalid input(s): FREET3 No results for input(s): VITAMINB12, FOLATE, FERRITIN, TIBC, IRON, RETICCTPCT in the last 72 hours. Coags: No results for input(s): INR in the last 72 hours.  Invalid input(s): PT Microbiology: Recent Results (from the past 240 hour(s))  Culture, blood (Routine X 2) w Reflex to ID Panel     Status: None   Collection Time:  09/07/16  1:00 PM  Result Value Ref Range  Status   Specimen Description BLOOD RIGHT ANTECUBITAL  Final   Special Requests BOTTLES DRAWN AEROBIC AND ANAEROBIC 5CC  Final   Culture NO GROWTH 5 DAYS  Final   Report Status 09/12/2016 FINAL  Final  Culture, blood (Routine X 2) w Reflex to ID Panel     Status: None   Collection Time: 09/07/16  4:45 PM  Result Value Ref Range Status   Specimen Description BLOOD LEFT ANTECUBITAL  Final   Special Requests BOTTLES DRAWN AEROBIC AND ANAEROBIC 5CC  Final   Culture NO GROWTH 5 DAYS  Final   Report Status 09/12/2016 FINAL  Final    FURTHER DISCHARGE INSTRUCTIONS:  Get Medicines reviewed and adjusted: Please take all your medications with you for your next visit with your Primary MD  Laboratory/radiological data: Please request your Primary MD to go over all hospital tests and procedure/radiological results at the follow up, please ask your Primary MD to get all Hospital records sent to his/her office.  In some cases, they will be blood work, cultures and biopsy results pending at the time of your discharge. Please request that your primary care M.D. goes through all the records of your hospital data and follows up on these results.  Also Note the following: If you experience worsening of your admission symptoms, develop shortness of breath, life threatening emergency, suicidal or homicidal thoughts you must seek medical attention immediately by calling 911 or calling your MD immediately  if symptoms less severe.  You must read complete instructions/literature along with all the possible adverse reactions/side effects for all the Medicines you take and that have been prescribed to you. Take any new Medicines after you have completely understood and accpet all the possible adverse reactions/side effects.   Do not drive when taking Pain medications or sleeping medications (Benzodaizepines)  Do not take more than prescribed Pain, Sleep and Anxiety Medications. It is not advisable to combine  anxiety,sleep and pain medications without talking with your primary care practitioner  Special Instructions: If you have smoked or chewed Tobacco  in the last 2 yrs please stop smoking, stop any regular Alcohol  and or any Recreational drug use.  Wear Seat belts while driving.  Please note: You were cared for by a hospitalist during your hospital stay. Once you are discharged, your primary care physician will handle any further medical issues. Please note that NO REFILLS for any discharge medications will be authorized once you are discharged, as it is imperative that you return to your primary care physician (or establish a relationship with a primary care physician if you do not have one) for your post hospital discharge needs so that they can reassess your need for medications and monitor your lab values.  Total Time spent coordinating discharge including counseling, education and face to face time equals 45 minutes.  SignedOren Binet 09/15/2016 9:45 AM

## 2016-09-15 NOTE — Clinical Social Work Note (Signed)
Per MD patient ready to DC back to [facility]. RN, patient/family, and facility notified of patient's DC. RN given number for report. DC packet on patient's chart. Ambulance transport requested for patient. CSW informed patient and wife at bedside that EMS is extremely delayed today. CSW signing off at this time.   Liz Beach MSW, [place], [place], JI:[phone]

## 2016-09-15 NOTE — Progress Notes (Signed)
Patient is discharged from room 5C02 at this time. Alert and in stable condition. Iv site d/c'd as well as tele. Report given to receiving nurse Rick Duff, RN at [facility] rehab with all questions answered. Left unit via stretcher by PTAR with family at side.

## 2016-09-15 NOTE — Progress Notes (Signed)
Neurology Progress Note  Subjective: No major 24 hours events noted. The patient is delirious and demented and therefore unable to participate with ROS. No family is present at the bedside at the time of my visit.   Current Meds:   Current Facility-Administered Medications:  .  acetaminophen (TYLENOL) tablet 650 mg, 650 mg, Oral, Q6H PRN, 650 mg at 09/14/16 1726 **OR** acetaminophen (TYLENOL) suppository 650 mg, 650 mg, Rectal, Q6H PRN, Samella Parr, NP .  amLODipine (NORVASC) tablet 5 mg, 5 mg, Oral, Daily, Modena Jansky, MD, 5 mg at 09/14/16 0930 .  memantine (NAMENDA XR) 24 hr capsule 28 mg, 28 mg, Oral, QPM, 28 mg at 09/14/16 1727 **AND** donepezil (ARICEPT) tablet 10 mg, 10 mg, Oral, QPM, Jonetta Osgood, MD, 10 mg at 09/14/16 1727 .  escitalopram (LEXAPRO) tablet 10 mg, 10 mg, Oral, Daily, Jonetta Osgood, MD, 10 mg at 09/14/16 0930 .  hydrALAZINE (APRESOLINE) injection 10 mg, 10 mg, Intravenous, Q6H PRN, Samella Parr, NP .  insulin aspart (novoLOG) injection 0-5 Units, 0-5 Units, Subcutaneous, QHS, Modena Jansky, MD, 2 Units at 09/10/16 2214 .  insulin aspart (novoLOG) injection 0-9 Units, 0-9 Units, Subcutaneous, TID WC, Modena Jansky, MD, 1 Units at 09/15/16 [phone] .  irbesartan (AVAPRO) tablet 300 mg, 300 mg, Oral, Daily, Jonetta Osgood, MD, 300 mg at 09/14/16 0930 .  mirtazapine (REMERON) tablet 7.5 mg, 7.5 mg, Oral, QHS, Modena Jansky, MD, 7.5 mg at 09/14/16 2213 .  mirtazapine (REMERON) tablet 7.5 mg, 7.5 mg, Oral, QHS PRN, Modena Jansky, MD .  polyvinyl alcohol (LIQUIFILM TEARS) 1.4 % ophthalmic solution 1 drop, 1 drop, Both Eyes, PRN, Modena Jansky, MD .  pravastatin (PRAVACHOL) tablet 20 mg, 20 mg, Oral, q1800, Modena Jansky, MD, 20 mg at 09/14/16 1727 .  sodium chloride flush (NS) 0.9 % injection 3 mL, 3 mL, Intravenous, Q12H, Samella Parr, NP, 3 mL at 09/14/16 2213 .  tamsulosin (FLOMAX) capsule 0.4 mg, 0.4 mg, Oral, Daily, Modena Jansky,  MD, 0.4 mg at 09/14/16 0930 .  triamcinolone (NASACORT) nasal inhaler 2 spray, 2 spray, Nasal, QHS, Modena Jansky, MD, 2 spray at 09/14/16 2213  Objective:  Temp:  [98.1 F (36.7 C)-100 F (37.8 C)] 99 F (37.2 C) (12/01 0506) Pulse Rate:  [63-69] 63 (12/01 0506) Resp:  [16-20] 18 (12/01 0506) BP: (125-160)/(53-61) 139/56 (12/01 0506) SpO2:  [95 %-98 %] 96 % (12/01 0506) Weight:  [79.3 kg (174 lb 13.2 oz)] 79.3 kg (174 lb 13.2 oz) (12/01 0506)  General: WDWN elderly male lying in bed in NAD. He is alert and attends to me when I enter the room. He will answer questions but his speech is severely dysarthric and hard to understand. He is fidgeting with his blankets. He does not follow commands for me.  HEENT: Neck is supple without lymphadenopathy. Mucous membranes are slightly dry. Sclerae are anicteric. There is no conjunctival injection.  CV: Regular, no murmur. Carotid pulses are 2+ and symmetric with no bruits. Distal pulses 2+ and symmetric.  Lungs: CTAB  Extremities: No C/C/E. Neuro: MS: As noted above. No aphasia.  CN: Pupils are equal and reactive from 3-->2 mm bilaterally. He blinks to threat from all directions. EOMs are grossly intact, no nystagmus. Corneals appear symmetric. He has possible widening of the right palpebral fissure but this is no consistent. The remainder of his cranial nerves cannot be accurately assessed as he is not able to participate  with the exam.  Motor: Normal bulk for age. He has gegenhalten paratonia throughout. He moves all four extremities with at least 4/5 strength. No tremor or other abnormal movements are observed.  Sensation: He withdraws from minimal noxious stimulation x4.  DTRs: 2+, R>L. Toes are mute bilaterally.  Coordination and gait: These cannot be assessed as the patient does not participate with the exam.   Labs: Lab Results  Component Value Date   WBC 10.5 09/12/2016   HGB 10.4 (L) 09/12/2016   HCT 31.0 (L) 09/12/2016   PLT 359  09/12/2016   GLUCOSE 129 (H) 09/12/2016   ALT 53 09/08/2016   AST 40 09/08/2016   NA 136 09/12/2016   K 3.4 (L) 09/12/2016   CL 104 09/12/2016   CREATININE 0.71 09/12/2016   BUN 17 09/12/2016   CO2 25 09/12/2016   INR 1.07 09/01/2016   HGBA1C 6.8 (H) 09/07/2016   CBC Latest Ref Rng & Units 09/12/2016 09/09/2016 09/08/2016  WBC 4.0 - 10.5 K/uL 10.5 10.9(H) 11.2(H)  Hemoglobin 13.0 - 17.0 g/dL 10.4(L) 9.9(L) 9.1(L)  Hematocrit 39.0 - 52.0 % 31.0(L) 29.1(L) 27.0(L)  Platelets 150 - 400 K/uL 359 297 235    Lab Results  Component Value Date   HGBA1C 6.8 (H) 09/07/2016   Lab Results  Component Value Date   ALT 53 09/08/2016   AST 40 09/08/2016   ALKPHOS 44 09/08/2016   BILITOT 0.5 09/08/2016    Radiology: There is no new neuroimaging for review.  A/P:   1. Delirium: This is acute, superimposed upon background dementia. This is multifactorial with likely contributing factors include TBI, intracranial hemorrhage, medication effect (Keppra, steroids), prolonged hospitalization, transfers between facilities, impaired sleep, and metabolic derangements. Treatment is supportive at this point. Continue to optimize metabolic status and treat any underlying infections as needed. Minimize CNS active medications, especially opiates, benzos and anything with strong anticholinergic properties. Continue to try to regulate circadian rhythms, keeping the room bright and active by day, dark and quiet by night. May consider a low dose of an atypical neuroleptic such as olanzapine 2.5 mg BID.   2. Dementia: Based upon his wife's report, I suspect this represents Alzheimer's dementia. His baseline dementia predisposes him to delirium from all causes and predicts delayed recovery from same. Unfortunately, it is unlikely that he will return to his premorbid level of function. However, it cannot be predicted how much recovery he can have at this time as recovery in setting such as this occurs over weeks and  even months before a new baseline is established.   3. 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 There is likely a postconcussive element to the injury that is contributing to his delirium. Treatment is supportive.   3. Subdural hematoma: He has acute L parietal/tentorial and chronic R frontal subdurals. These are non-operative. Continue supportive care. Plavix remains on hold. Generally antiplatelet agents should be held for 10-14 days after an acute intracranial hemorrhage, after which must weigh risk versus benefit before restarting them.   4. Subarachnoid hemorrhage: This is acute, due to TBI. No intervention necessary, continue supportive care. Hold Plavix as above.   5. Intraventricular hemorrhage: this is acute, due to TBI. Supportive care. Hold Plavix as above.  No family present at the time of my visit this morning.    Melba Coon, MD [facility]

## 2016-09-19 ENCOUNTER — Non-Acute Institutional Stay (SKILLED_NURSING_FACILITY): Payer: Medicare Other | Admitting: Internal Medicine

## 2016-09-19 ENCOUNTER — Encounter: Payer: Self-pay | Admitting: Internal Medicine

## 2016-09-19 DIAGNOSIS — R269 Unspecified abnormalities of gait and mobility: Secondary | ICD-10-CM

## 2016-09-19 DIAGNOSIS — S065X9A Traumatic subdural hemorrhage with loss of consciousness of unspecified duration, initial encounter: Secondary | ICD-10-CM

## 2016-09-19 DIAGNOSIS — I6200 Nontraumatic subdural hemorrhage, unspecified: Secondary | ICD-10-CM

## 2016-09-19 DIAGNOSIS — S065XAA Traumatic subdural hemorrhage with loss of consciousness status unknown, initial encounter: Secondary | ICD-10-CM

## 2016-09-19 DIAGNOSIS — F0150 Vascular dementia without behavioral disturbance: Secondary | ICD-10-CM

## 2016-09-19 DIAGNOSIS — I10 Essential (primary) hypertension: Secondary | ICD-10-CM

## 2016-09-19 DIAGNOSIS — M47816 Spondylosis without myelopathy or radiculopathy, lumbar region: Secondary | ICD-10-CM | POA: Diagnosis not present

## 2016-09-19 DIAGNOSIS — I609 Nontraumatic subarachnoid hemorrhage, unspecified: Secondary | ICD-10-CM

## 2016-09-19 DIAGNOSIS — F0280 Dementia in other diseases classified elsewhere without behavioral disturbance: Secondary | ICD-10-CM | POA: Diagnosis not present

## 2016-09-19 DIAGNOSIS — G309 Alzheimer's disease, unspecified: Secondary | ICD-10-CM

## 2016-09-19 DIAGNOSIS — S01312S Laceration without foreign body of left ear, sequela: Secondary | ICD-10-CM

## 2016-09-19 NOTE — Progress Notes (Signed)
Patient ID: Kenneth Stark, male   DOB: [birth year], 82 y.o.   MRN: [id number]  Provider:  Rexene Edison. Mariea Clonts, D.O., C.M.D. Location:  Occupational psychologist of Service:  SNF (31)  PCP: Geoffery Lyons, MD Patient Care Team: Burnard Bunting, MD as PCP - General (Internal Medicine)  Extended Emergency Contact Information Primary Emergency Contact: Morford,[name] Address: [id number]          [place] [address] [place] Phone: [phone] Relation: Spouse Secondary Emergency Contact: [name] [place] Mobile Phone: [phone] Relation: Daughter  Code Status: DNR Goals of Care: Advanced Directive information Advanced Directives 09/19/2016  Does Patient Have a Medical Advance Directive? Yes  Type of Paramedic of [place];Living will  Does patient want to make changes to medical advance directive? -  Copy of [place] in Chart? Yes  Would patient like information on creating a medical advance directive? -    Chief Complaint  Patient presents with  . New Admit To SNF    Rehab admission    HPI: Patient is a 82 y.o. male With baseline moderate dementia who was here for rehab s/p SDH seen today for admission to [facility] rehab for extension of his subdural hematoma and SAH.  He was hospitalized 11/23-12/1/17.  On 11/23, his wife noted he had increased headache, lethargy, slurred speech and confusion for two days (note that I saw him 11/21 for his admission and she felt he was improving).  He was noted in the ED to have left arm and leg drift.  On 11/22, per rehab notes, he had been having fevers with T 100.9 on 11/22, increased confusion, lethargy. Stand up lift was being used for transfers. In the ED, he awoke to light stimuli, knew his name and followed commands, had good handgrips.  CT revealed evolving SDH and SAH with left mass effect, but no significant shift.  UA was negative,  WBC 12, K 2.7 and repleted.  Dr. Saintclair Halsted saw him and said no need for neurosurgery.  He was readmitted by family medicine and given abx (vanc and cefepime) due to the fevers.  He was noted to have Broca's dysphasia.  He was also treated with decadron 4mg  po tid, keppra 500mg  po bid (d/cd after negative EEG and sedation ensued).  His asa, plavix, hctz were stopped and it was recommended that neurosurgery be contacted to clear any resumption of the blood thinners.  Ear sutures were removed during the hospitalization.    When seen, he was more alert than the prior time, but remained seemingly delirious, trying to crawl out of his bed repeatedly while I was speaking with his son.  He was able to follow commands.  He was still reaching for some things we could not see.  He will be working with PT, OT, ST.    Past Medical History:  Diagnosis Date  . Abnormality of gait   . Arthritis   . Cervical spondylosis   . Degenerative joint disease (DJD) of lumbar spine   . Diplopia   . Dyslipidemia   . Essential tremor   . Hearing difficulty    hearing aids  . Hypertension   . 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 Small vessel disease    Past Surgical History:  Procedure Laterality Date  . KNEE ARTHROSCOPY Left    Dr. Hart Robinsons 2002  . knee injections  Right    Dr. Adriana Mccallum    reports that he has never smoked. He has never used smokeless tobacco. He reports that he drinks alcohol. He reports that he does not use drugs. Social History   Social History  . Marital status: Married    Spouse name: N/A  . Number of children: 4  . Years of education: N/A   Occupational History  . Retired     [employer]   Social History Main Topics  . Smoking status: Never Smoker  . Smokeless tobacco: Never Used  . Alcohol use Yes  . Drug use: No  . Sexual activity: Not on file   Other Topics Concern  . Not on file   Social History Narrative  . No narrative on file     Functional Status Survey:  able to feed himself with guidance, otherwise dependent at present  Family History  Problem Relation Age of Onset  . Stroke Mother   . Stroke Father   . Heart disease Father   . Dementia Brother   . Renal Disease Brother   . Renal Disease Daughter     Health Maintenance  Topic Date Due  . ZOSTAVAX  10/06/1987  . PNA vac Low Risk Adult (1 of 2 - PCV13) 10/05/1992  . TETANUS/TDAP  09/01/2026  . INFLUENZA VACCINE  Completed    No Known Allergies  Allergies as of 09/19/2016   No Known Allergies     Medication List       Accurate as of 09/19/16 11:59 PM. Always use your most recent med list.          acetaminophen 500 MG tablet Commonly known as:  TYLENOL Take 1,000 mg by mouth 3 (three) times daily as needed for mild pain or moderate pain.   amLODipine 10 MG tablet Commonly known as:  NORVASC Take 0.5 tablets (5 mg total) by mouth daily.   CALTRATE 600+D PO Take 600 mg by mouth 3 (three) times daily.   clopidogrel 75 MG tablet Commonly known as:  PLAVIX Take 75 mg by mouth daily.   Co-Enzyme Q-10 100 MG Caps Take 100 mg by mouth daily.   escitalopram 10 MG tablet Commonly known as:  LEXAPRO Take 10 mg by mouth daily.   EYE LUBRICANT OP Place 1 drop into both eyes daily.   GLUCOSAMINE CHOND COMPLEX/MSM PO Take 1 tablet by mouth 3 (three) times daily.   Lutein-Zeaxanthin 25-5 MG Caps Take 1 capsule by mouth daily.   mirtazapine 30 MG tablet Commonly known as:  REMERON Take 30 mg by mouth at bedtime.   multivitamin tablet Take 1 tablet by mouth daily.   NAMZARIC 28-10 MG Cp24 Generic drug:  Memantine HCl-Donepezil HCl Take 1 capsule by mouth every evening.   OMEGA 3 PO Take 1 capsule by mouth 2 (two) times daily.   polyethylene glycol packet Commonly known as:  MIRALAX / GLYCOLAX Take 17 g by mouth daily. With 8oz of water   potassium chloride 10 MEQ tablet Commonly known as:  K-DUR Take 10 mEq by mouth  daily.   pravastatin 20 MG tablet Commonly known as:  PRAVACHOL Take 20 mg by mouth daily.   RAPAFLO 8 MG Caps capsule Generic drug:  silodosin Take 8 mg by mouth daily.   saw palmetto 500 MG capsule Take 500 mg by mouth daily.   telmisartan 80 MG tablet Commonly known as:  MICARDIS Take 80 mg by mouth daily.   triamcinolone 55 MCG/ACT  Aero nasal inhaler Commonly known as:  NASACORT Place 2 sprays into the nose at bedtime.   UNABLE TO FIND [place] tablets take 2 tablets at bedtime   vitamin C 1000 MG tablet Take 1,000 mg by mouth 3 (three) times daily.       Review of Systems  Unable to perform ROS: Mental status change (broca's dysphasia)    Vitals:   09/19/16 1043  BP: 135/73  Pulse: 69  Resp: 20  Temp: 99 F (37.2 C)  TempSrc: Oral  SpO2: 97%   There is no height or weight on file to calculate BMI. Physical Exam  Constitutional: He appears well-nourished. No distress.  HENT:  Left ear healing well, inflammation down  Eyes: Conjunctivae are normal. Pupils are equal, round, and reactive to light.  Neck: Neck supple. No JVD present.  Cardiovascular: Normal rate, regular rhythm, normal heart sounds and intact distal pulses.   Pulmonary/Chest: Effort normal and breath sounds normal. No respiratory distress.  Abdominal: Soft. Bowel sounds are normal. He exhibits no distension and no mass. There is no tenderness. There is no rebound and no guarding.  Musculoskeletal:  Using wheelchair now and hoyer  Lymphadenopathy:    He has no cervical adenopathy.  Neurological: He exhibits abnormal muscle tone. Coordination abnormal.  Is easily arousable, but falling asleep off and on, left pronator drift remains; essential tremor  Skin: Skin is warm and dry.  Ecchymoses of face/ left ear healing    Labs reviewed: Basic Metabolic Panel:  Recent Labs  09/03/16 0235  09/07/16 1402 09/07/16 1844  09/09/16 0548 09/12/16 0212 09/15/16 0555  NA 138  < >  --   --    < > 137 136 135  K 2.8*  < >  --   --   < > 3.9 3.4* 3.6  CL 103  < >  --   --   < > 104 104 100*  CO2 26  < >  --   --   < > 24 25 24   GLUCOSE 195*  < >  --   --   < > 191* 129* 138*  BUN 19  < >  --   --   < > 21* 17 15  CREATININE 1.01  < >  --   --   < > 0.97 0.71 0.87  CALCIUM 9.2  < >  --   --   < > 8.8* 8.6* 9.1  MG 1.9  --  1.8  --   --   --   --   --   PHOS  --   --   --  3.2  --   --   --   --   < > = values in this interval not displayed. Liver Function Tests:  Recent Labs  09/07/16 1300 09/08/16 0603  AST 42* 40  ALT 53 53  ALKPHOS 51 44  BILITOT 0.5 0.5  PROT 5.7* 5.3*  ALBUMIN 2.9* 2.6*   No results for input(s): LIPASE, AMYLASE in the last 8760 hours.  Recent Labs  09/09/16 1223  AMMONIA 19   CBC:  Recent Labs  09/01/16 1201  09/07/16 1300 09/08/16 0603 09/09/16 0548 09/12/16 0212  WBC 17.4*  < > 12.1* 11.2* 10.9* 10.5  NEUTROABS 15.2*  --  8.9*  --   --  6.8  HGB 12.0*  < > 9.4* 9.1* 9.9* 10.4*  HCT 35.8*  < > 27.4* 27.0* 29.1* 31.0*  MCV 92.0  < >  90.7 91.2 90.4 91.7  PLT 220  < > 255 235 297 359  < > = values in this interval not displayed. Cardiac Enzymes: No results for input(s): CKTOTAL, CKMB, CKMBINDEX, TROPONINI in the last 8760 hours. BNP: Invalid input(s): POCBNP Lab Results  Component Value Date   HGBA1C 6.8 (H) 09/07/2016   No results found for: TSH No results found for: VITAMINB12 No results found for: FOLATE No results found for: IRON, TIBC, FERRITIN  Imaging and Procedures obtained prior to SNF admission: Ct Head Wo Contrast  Result Date: 09/07/2016 CLINICAL DATA:  Altered mental status.  Increased lethargy. EXAM: CT HEAD WITHOUT CONTRAST TECHNIQUE: Contiguous axial images were obtained from the base of the skull through the vertex without intravenous contrast. COMPARISON:  CT head without contrast 09/02/2016 and 09/01/2016. FINDINGS: Brain: There is expected evolution of left subdural and subarachnoid hemorrhage. The size  of the subdural component has not changed significantly. There is mixed density compatible with evolution of blood products. Additional subarachnoid and subdural blood layers along the tentorium, left greater than right. Intraventricular hemorrhage is more prominent than on the prior exam. Minimal left to right midline shift is present. There is some local mass effect on the left with crowding of the sulci and partial effacement of the left lateral ventricle. No definite new areas of hemorrhage are present. There is slight dilation of the right lateral ventricle without hydrocephalus. Vascular: Extensive atherosclerotic calcifications are present within the cavernous internal carotid arteries. There is no hyperdense vessel. Skull: The calvarium is intact. Sinuses/Orbits: The paranasal sinuses and mastoid air cells are clear. IMPRESSION: 1. Evolving subdural and subarachnoid blood products, left greater than right. 2. Increased prominence of intraventricular hemorrhage, likely reflecting layering blood from the previous hemorrhage. 3. No definite acute hemorrhage. 4. Left-sided mass effect without significant midline shift. Electronically Signed   By: San Morelle M.D.   On: 09/07/2016 13:13   Dg Chest Port 1 View  Result Date: 09/08/2016 CLINICAL DATA:  Hypoxia, history hypertension, small vessel disease, essential tremor EXAM: PORTABLE CHEST 1 VIEW COMPARISON:  Portable exam 1440 hours compared to 09/07/2016 FINDINGS: Upper normal size of cardiac silhouette. Mediastinal contours and pulmonary vascularity normal. Atherosclerotic calcifications aorta. Bibasilar atelectasis. Lungs otherwise clear. No pleural effusion or pneumothorax. Old healed RIGHT rib fractures noted. IMPRESSION: Bibasilar atelectasis. Electronically Signed   By: Lavonia Dana M.D.   On: 09/08/2016 14:47   Dg Chest Port 1 View  Result Date: 09/07/2016 CLINICAL DATA:  Fever.  Acute mental status change. EXAM: PORTABLE CHEST 1 VIEW  COMPARISON:  January 15, 2007 FINDINGS: The heart, hila, and mediastinum are unchanged. No pneumothorax. Mild atelectasis in the left base. A small left effusion is not excluded. IMPRESSION: Mild atelectasis in the left base. A small left effusion is not excluded. Electronically Signed   By: Dorise Bullion III M.D   On: 09/07/2016 14:22    Assessment/Plan 1. Subdural hematoma (HCC) -had progressed causing new symptoms including more weakness, dysphasia and confusion and requiring rehospitalization -no certain cause of fever id'd in terms of infectious etiology - cont to monitor - I would favor keeping him off anticoagulants long term   2. Subarachnoid hemorrhage ([place]) -had new SAH also -cont to monitor neurologically -cont PT, OT, ST here  3. Mixed Alzheimer's and vascular dementia -had baseline moderate dementia on aricept and namenda making his degree of improvement less predictable; however, his wife reported he was still going to work some days   4. Complex laceration  of left ear, sequela -s/p repair and looking good now  5. Essential hypertension -bp well controlled at present, avoid hypotension that will interfere with healing  6. Osteoarthritis of lumbar spine, unspecified spinal osteoarthritis complication status -continues on tylenol for pain  7. Abnormality of gait -using wheelchair and hoyer, but working with PT to get back to more independence and use of walker  Family/ staff Communication: discussed with pt's son and rehab nurse  Labs/tests ordered:  Cbc, bmp in 1 wk Morgin Halls L. Mory Herrman, D.O. [facility] [address] [place], [place] [address] Cell Phone (Mon-Fri 8am-5pm):  [phone] On Call:  [phone] & follow prompts after 5pm & weekends Office Phone:  [phone] Office Fax:  [phone]

## 2016-09-22 LAB — BASIC METABOLIC PANEL
BUN: 20 mg/dL (ref 4–21)
Creatinine: 0.8 mg/dL (ref <=1.3)
Glucose: 130 mg/dL
Potassium: 4.3 mmol/L (ref 3.4–5.3)
SODIUM: 137 mmol/L (ref 137–147)

## 2016-10-13 ENCOUNTER — Other Ambulatory Visit: Payer: Self-pay | Admitting: Neurological Surgery

## 2016-10-13 DIAGNOSIS — I609 Nontraumatic subarachnoid hemorrhage, unspecified: Secondary | ICD-10-CM

## 2016-10-13 DIAGNOSIS — S065XAA Traumatic subdural hemorrhage with loss of consciousness status unknown, initial encounter: Secondary | ICD-10-CM

## 2016-10-13 DIAGNOSIS — S065X9A Traumatic subdural hemorrhage with loss of consciousness of unspecified duration, initial encounter: Secondary | ICD-10-CM

## 2016-10-15 ENCOUNTER — Encounter: Payer: Self-pay | Admitting: Internal Medicine

## 2016-10-18 LAB — CBC AND DIFFERENTIAL
HEMATOCRIT: 34 % — AB (ref 41–53)
Hemoglobin: 11.3 g/dL — AB (ref 13.5–17.5)
WBC: 6.4 10^3/mL

## 2016-10-20 ENCOUNTER — Ambulatory Visit
Admission: RE | Admit: 2016-10-20 | Discharge: 2016-10-20 | Disposition: A | Discharge status: Home / Self Care | Payer: Medicare Other | Source: Ambulatory Visit | Attending: Neurological Surgery | Admitting: Neurological Surgery

## 2016-10-20 DIAGNOSIS — I609 Nontraumatic subarachnoid hemorrhage, unspecified: Secondary | ICD-10-CM

## 2016-10-20 DIAGNOSIS — S065X9A Traumatic subdural hemorrhage with loss of consciousness of unspecified duration, initial encounter: Secondary | ICD-10-CM

## 2016-10-20 DIAGNOSIS — S065XAA Traumatic subdural hemorrhage with loss of consciousness status unknown, initial encounter: Secondary | ICD-10-CM

## 2016-10-26 ENCOUNTER — Non-Acute Institutional Stay (SKILLED_NURSING_FACILITY): Payer: Medicare Other | Admitting: Adult Health

## 2016-10-26 DIAGNOSIS — R269 Unspecified abnormalities of gait and mobility: Secondary | ICD-10-CM

## 2016-10-26 DIAGNOSIS — N400 Enlarged prostate without lower urinary tract symptoms: Secondary | ICD-10-CM

## 2016-10-26 DIAGNOSIS — F0150 Vascular dementia without behavioral disturbance: Secondary | ICD-10-CM

## 2016-10-26 DIAGNOSIS — I679 Cerebrovascular disease, unspecified: Secondary | ICD-10-CM | POA: Diagnosis not present

## 2016-10-26 DIAGNOSIS — M47816 Spondylosis without myelopathy or radiculopathy, lumbar region: Secondary | ICD-10-CM | POA: Diagnosis not present

## 2016-10-26 DIAGNOSIS — I6200 Nontraumatic subdural hemorrhage, unspecified: Secondary | ICD-10-CM

## 2016-10-26 DIAGNOSIS — I10 Essential (primary) hypertension: Secondary | ICD-10-CM | POA: Diagnosis not present

## 2016-10-26 DIAGNOSIS — S065X9A Traumatic subdural hemorrhage with loss of consciousness of unspecified duration, initial encounter: Secondary | ICD-10-CM

## 2016-10-26 DIAGNOSIS — G309 Alzheimer's disease, unspecified: Secondary | ICD-10-CM

## 2016-10-26 DIAGNOSIS — F0280 Dementia in other diseases classified elsewhere without behavioral disturbance: Secondary | ICD-10-CM

## 2016-10-26 DIAGNOSIS — E785 Hyperlipidemia, unspecified: Secondary | ICD-10-CM

## 2016-10-26 DIAGNOSIS — S065XAA Traumatic subdural hemorrhage with loss of consciousness status unknown, initial encounter: Secondary | ICD-10-CM

## 2016-10-26 NOTE — Progress Notes (Signed)
Location:  Occupational psychologist of Service:  SNF (31) Provider:  Cindi Carbon, [place] [phone]   Kenneth Lyons, MD  Patient Care Team: Burnard Bunting, MD as PCP - General (Internal Medicine)  Extended Emergency Contact Information Primary Emergency Contact: Stonehouse,[name] Address: [id number]          [place] [address] [place] Phone: [phone] Relation: Spouse Secondary Emergency Contact: [place] Mobile Phone: [phone] Relation: Daughter  Code Status:  Full code Goals of care: Advanced Directive information Advanced Directives 09/19/2016  Does Patient Have a Medical Advance Directive? Yes  Type of Paramedic of [place];Living will  Does patient want to make changes to medical advance directive? -  Copy of [place] in Chart? Yes  Would patient like information on creating a medical advance directive? -     Chief Complaint  Patient presents with  . Medical Management of Chronic Issues    HPI:  Pt is a 83 y.o. male seen today for medical management of chronic diseases.  He is currently residing in rehab due to fall with subsequent subdural hematoma and laceration of the left ear with hospitalization on 11/17. He is residing here for therapy due to gait instability and increased confusion.  [place]. He was on plavix due to hx of TIA but this has been on hold due to the subdural hematoma.  He recently returned to see Dr. Cyndy Stark who by a report from his wife, recommended no further antiplatelet therapy.  His follow up CT of the head on 10/20/16 showed decreased in size of the hematoma, underlying chronic small vessel dz and brain atrophy were noted.  He has a hx of dementia which was exacerbated by the above but has progressively gotten better.  After his fall he reported headaches and  lethargy but this has resolved according Mr. Kenneth Stark and his wife. He has showed improvement in cognition and gait stability with therapy.   He denies any bowel or bladder issues.   Bp controlled at 136/69 with Micardis and norvasc He uses remeron at night which I believe is for insomnia, as well as lexapro for depression. He has some agitation post hospitalization but this has resolved His wife was given a most form to review but she states that he has improved significantly and therefore she would like him to remain a full code with hospitalizations for acute issues that can not be managed here at [facility] 17/30 09/04/16 poor recall and serial 7's  Past Medical History:  Diagnosis Date  . Abnormality of gait   . Arthritis   . Cervical spondylosis   . Degenerative joint disease (DJD) of lumbar spine   . Diplopia   . Dyslipidemia   . Essential tremor   . Hearing difficulty    hearing aids  . Hypertension   . 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 Small vessel disease    Past Surgical History:  Procedure Laterality Date  . KNEE ARTHROSCOPY Left    Dr. Hart Robinsons 2002  . knee injections Right    Dr. Adriana Mccallum    No Known Allergies  Allergies as of 10/26/2016   No Known Allergies     Medication List       Accurate as of 10/26/16 11:59 PM. Always use your  most recent med list.          acetaminophen 500 MG tablet Commonly known as:  TYLENOL Take 1,000 mg by mouth 3 (three) times daily as needed for mild pain or moderate pain.   amLODipine 10 MG tablet Commonly known as:  NORVASC Take 0.5 tablets (5 mg total) by mouth daily.   CALTRATE 600+D PO Take 600 mg by mouth 3 (three) times daily.   Co-Enzyme Q-10 100 MG Caps Take 100 mg by mouth daily.   escitalopram 10 MG tablet Commonly known as:  LEXAPRO Take 10 mg by mouth daily.   EYE LUBRICANT OP Place 1 drop into both eyes daily.   GLUCOSAMINE CHOND COMPLEX/MSM PO Take 1  tablet by mouth 3 (three) times daily.   Lutein-Zeaxanthin 25-5 MG Caps Take 1 capsule by mouth daily.   mirtazapine 30 MG tablet Commonly known as:  REMERON Take 30 mg by mouth at bedtime.   multivitamin tablet Take 1 tablet by mouth daily.   NAMZARIC 28-10 MG Cp24 Generic drug:  Memantine HCl-Donepezil HCl Take 1 capsule by mouth every evening.   OMEGA 3 PO Take 1 capsule by mouth 2 (two) times daily.   polyethylene glycol packet Commonly known as:  MIRALAX / GLYCOLAX Take 17 g by mouth daily. With 8oz of water   potassium chloride 10 MEQ tablet Commonly known as:  K-DUR Take 10 mEq by mouth daily.   pravastatin 20 MG tablet Commonly known as:  PRAVACHOL Take 20 mg by mouth daily.   RAPAFLO 8 MG Caps capsule Generic drug:  silodosin Take 8 mg by mouth daily.   saw palmetto 500 MG capsule Take 500 mg by mouth daily.   telmisartan 80 MG tablet Commonly known as:  MICARDIS Take 80 mg by mouth daily.   triamcinolone 55 MCG/ACT Aero nasal inhaler Commonly known as:  NASACORT Place 2 sprays into the nose at bedtime.   UNABLE TO FIND [place] tablets take 2 tablets at bedtime   vitamin C 1000 MG tablet Take 1,000 mg by mouth 3 (three) times daily.       Review of Systems  Constitutional: Negative for activity change, appetite change, chills, diaphoresis, fatigue, fever and unexpected weight change.  Respiratory: Negative for cough, shortness of breath, wheezing and stridor.   Cardiovascular: Negative for chest pain, palpitations and leg swelling.  Gastrointestinal: Negative for abdominal distention, abdominal pain, constipation and diarrhea.  Genitourinary: Negative for difficulty urinating and dysuria.  Musculoskeletal: Positive for gait problem. Negative for arthralgias, back pain, joint swelling and myalgias.  Neurological: Negative for dizziness, seizures, syncope, facial asymmetry, speech difficulty, weakness and headaches.  Hematological: Negative for  adenopathy. Does not bruise/bleed easily.  Psychiatric/Behavioral: Positive for confusion. Negative for agitation, behavioral problems, dysphoric mood and sleep disturbance.    Immunization History  Administered Date(s) Administered  . Influenza, High Dose Seasonal PF 07/31/2016  . Tdap 09/01/2016   Pertinent  Health Maintenance Due  Topic Date Due  . PNA vac Low Risk Adult (1 of 2 - PCV13) 10/05/1992  . INFLUENZA VACCINE  Completed   No flowsheet data found. Functional Status Survey:    Vitals:   10/26/16 0958  BP: 136/69  Pulse: 71  Resp: 16  Temp: 97.9 F (36.6 C)  SpO2: 95%  Weight: 160 lb 6.4 oz (72.8 kg)   Body mass index is 24.03 kg/m. Physical Exam  Constitutional: He is oriented to person, place, and time. No distress.  HENT:  Head: Normocephalic and atraumatic.  Neck: Normal range of motion. Neck supple. No JVD present. No tracheal deviation present. No thyromegaly present.  Cardiovascular: Normal rate and regular rhythm.   No murmur heard. Pulmonary/Chest: Effort normal and breath sounds normal. No respiratory distress. He has no wheezes.  Abdominal: Soft. Bowel sounds are normal. He exhibits no distension. There is no tenderness.  Lymphadenopathy:    He has no cervical adenopathy.  Neurological: He is alert and oriented to person, place, and time. No cranial nerve deficit.  Skin: Skin is warm and dry. He is not diaphoretic.  Psychiatric: He has a normal mood and affect.    Labs reviewed:  Recent Labs  09/03/16 0235  09/07/16 1402 09/07/16 1844  09/09/16 0548 09/12/16 0212 09/15/16 0555 09/22/16  NA 138  < >  --   --   < > 137 136 135 137  K 2.8*  < >  --   --   < > 3.9 3.4* 3.6 4.3  CL 103  < >  --   --   < > 104 104 100*  --   CO2 26  < >  --   --   < > 24 25 24   --   GLUCOSE 195*  < >  --   --   < > 191* 129* 138*  --   BUN 19  < >  --   --   < > 21* 17 15 20   CREATININE 1.01  < >  --   --   < > 0.97 0.71 0.87 0.8  CALCIUM 9.2  < >  --   --    < > 8.8* 8.6* 9.1  --   MG 1.9  --  1.8  --   --   --   --   --   --   PHOS  --   --   --  3.2  --   --   --   --   --   < > = values in this interval not displayed.  Recent Labs  09/07/16 1300 09/08/16 0603  AST 42* 40  ALT 53 53  ALKPHOS 51 44  BILITOT 0.5 0.5  PROT 5.7* 5.3*  ALBUMIN 2.9* 2.6*    Recent Labs  09/01/16 1201  09/07/16 1300 09/08/16 0603 09/09/16 0548 09/12/16 0212 10/18/16  WBC 17.4*  < > 12.1* 11.2* 10.9* 10.5 6.4  NEUTROABS 15.2*  --  8.9*  --   --  6.8  --   HGB 12.0*  < > 9.4* 9.1* 9.9* 10.4* 11.3*  HCT 35.8*  < > 27.4* 27.0* 29.1* 31.0* 34*  MCV 92.0  < > 90.7 91.2 90.4 91.7  --   PLT 220  < > 255 235 297 359  --   < > = values in this interval not displayed. No results found for: TSH Lab Results  Component Value Date   HGBA1C 6.8 (H) 09/07/2016   No results found for: CHOL, HDL, LDLCALC, LDLDIRECT, TRIG, CHOLHDL  Significant Diagnostic Results in last 30 days:  Ct Head Wo Contrast  Result Date: 10/20/2016 CLINICAL DATA:  Followup subdural hematomas EXAM: CT HEAD WITHOUT CONTRAST TECHNIQUE: Contiguous axial images were obtained from the base of the skull through the vertex without intravenous contrast. COMPARISON:  09/10/2016 FINDINGS: Brain: Right-sided low-density subdural hygroma has resolved. Left-sided mixed density subdural hematoma is thinner, maximal thickness 2 mm today. No evidence of hyper acute bleeding. Previously seen subdural hematoma along the tentorium and falx  has resolved. Subarachnoid hemorrhage in the sulci no longer visible. Small amount of layering blood in the occipital horns still visible. No evidence of intraparenchymal hemorrhage. Brain atrophy and chronic small vessel ischemic changes appear the same. No evidence of developing hydrocephalus. Vascular: There is atherosclerotic calcification of the major vessels at the base of the brain. Skull: No skull fracture. Sinuses/Orbits: Clear/normal Other: None significant IMPRESSION:  Favorable evolutionary changes. Resolution of right-sided low-density hygroma. Decrease in size of left mixed density subdural hematoma, maximal thickness 2 mm presently. Resolution of subdural blood along the tentorium and falx. Only a small amount of residual hyperdense blood in the occipital horns. Electronically Signed   By: Nelson Chimes M.D.   On: 10/20/2016 10:57    Assessment/Plan  1. Subdural hematoma (HCC) Resolving Improved mentation, following commands, progressing in therapy Will need continued PT/OT  2. Cerebrovascular disease Noted on CT scan BP controlled A1C ok Dr. Cyndy Stark did not recommend further antiplatelet therapy which was prescribed for two previous TIAs at his last visit  3. Essential hypertension Controlled Continue Micardis and Norvasc  4. Osteoarthritis of lumbar spine, unspecified spinal osteoarthritis complication status Denies issues with neck or back pain today Has tylenol prn and supplements  5. Abnormality of gait Most likely due to spinal issues, weakness and dementia Use walker Fall risk  6. Dyslipidemia Continue pravastatin and omega 3's Followed by Dr Reynaldo Minium  7. Benign prostatic hyperplasia without lower urinary tract symptoms Continue saw palmetto and Rapaflo (BP ok)  8. Mixed vascular/Alz dementia Continue aricept/namenda  He is close to discharge. Medically he is stable and ready for d/c but needs more therapy for gait.  He will need assistance at home from his wife but also they intend to hire caregivers to help as well.  Will need f/u with PCP and neurology.  Family/ staff Communication: discussed with resident and wife  Labs/tests ordered:  NA

## 2016-10-31 DIAGNOSIS — F0280 Dementia in other diseases classified elsewhere without behavioral disturbance: Secondary | ICD-10-CM | POA: Insufficient documentation

## 2016-10-31 DIAGNOSIS — F0150 Vascular dementia without behavioral disturbance: Secondary | ICD-10-CM | POA: Insufficient documentation

## 2016-10-31 DIAGNOSIS — N400 Enlarged prostate without lower urinary tract symptoms: Secondary | ICD-10-CM | POA: Insufficient documentation

## 2016-10-31 DIAGNOSIS — G309 Alzheimer's disease, unspecified: Secondary | ICD-10-CM

## 2016-11-13 ENCOUNTER — Telehealth: Payer: Self-pay | Admitting: Neurology

## 2016-11-13 NOTE — Telephone Encounter (Signed)
Pt's wife called said he fell 09/01/16, resulting in a brain bleed. He has been [facility] since 09/13/18. She said Production assistant, radio has recommended he be seen by neurologist for f/u exam. She said their daughter is wanting to accompany them to the appt, she is no available until after 11/28/16. Please call to discuss an appt.

## 2016-11-14 NOTE — Telephone Encounter (Signed)
Returned call to pt's wife. Appt scheduled for Fri, Feb 23rd. She verbalized understanding and appreciation for call.

## 2016-12-08 ENCOUNTER — Ambulatory Visit (INDEPENDENT_AMBULATORY_CARE_PROVIDER_SITE_OTHER): Payer: Medicare Other | Admitting: Neurology

## 2016-12-08 ENCOUNTER — Encounter: Payer: Self-pay | Admitting: Neurology

## 2016-12-08 VITALS — BP 133/67 | HR 71 | Ht 68.5 in | Wt 168.0 lb

## 2016-12-08 DIAGNOSIS — E538 Deficiency of other specified B group vitamins: Secondary | ICD-10-CM

## 2016-12-08 DIAGNOSIS — R269 Unspecified abnormalities of gait and mobility: Secondary | ICD-10-CM | POA: Diagnosis not present

## 2016-12-08 MED ORDER — CARBIDOPA-LEVODOPA 25-100 MG PO TABS: 0.5000 | ORAL_TABLET | Freq: Three times a day (TID) | ORAL | 2 refills | Status: DC

## 2016-12-08 NOTE — Progress Notes (Signed)
Reason for visit: Gait disorder  Referring physician: [name] is a 83 y.o. male  History of present illness:  Kenneth Stark is an 83 year old right-handed white male with a history of a chronic gait disorder. He was last seen through this office greater than 3 years ago, he has not been seen since that time. The patient indicates that his ability to ambulate has gradually declined over the last 3 years, he fell with a significant injury on 09/01/2016. The patient hit his head, he was rendered unconscious for an unknown length of time. He sustained a small subdural hematoma on the left hemisphere, and a subarachnoid hemorrhage associated with the trauma. The patient required a prolonged course of physical, occupational, and speech therapy. The patient has regained his ability to ambulate. The patient is now walking with a walker. The patient is taking short shuffling steps, he has not had any further falls fortunately. The patient has returned to his usual cognitive baseline, he does have some mild memory issues. The patient denies any significant neck or low back pain, he occasionally will have some back pain in the morning when he first gets up, but this dissipates as soon as he begins to walk. The patient denies any numbness in the extremities. He claims that his bowels and his bladder working fairly well. The patient returns to this office for an evaluation. His last follow-up CT scan of the head was on 10/20/2016, this study showed good resolution of the subarachnoid blood and the subdural hematoma.  Past Medical History:  Diagnosis Date  . Abnormality of gait   . Arthritis   . Cervical spondylosis   . Degenerative joint disease (DJD) of lumbar spine   . Diplopia   . Dyslipidemia   . Essential tremor   . Hearing difficulty    hearing aids  . Hypertension   . 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 Small vessel disease     Past  Surgical History:  Procedure Laterality Date  . KNEE ARTHROSCOPY Left    Dr. Hart Robinsons 2002  . knee injections Right    Dr. Adriana Mccallum    Family History  Problem Relation Age of Onset  . Stroke Mother   . Stroke Father   . Heart disease Father   . Dementia Brother   . Renal Disease Brother   . Renal Disease Daughter     Social history:  reports that he has never smoked. He has never used smokeless tobacco. He reports that he drinks alcohol. He reports that he does not use drugs.  Medications:  Prior to Admission medications   Medication Sig Start Date End Date Taking? Authorizing Provider  acetaminophen (TYLENOL) 500 MG tablet Take 1,000 mg by mouth 3 (three) times daily as needed for mild pain or moderate pain.   Yes Historical Provider, MD  amLODipine (NORVASC) 10 MG tablet Take 0.5 tablets (5 mg total) by mouth daily. Patient taking differently: Take 10 mg by mouth daily.  09/15/16  Yes Shanker Kristeen Mans, MD  Artificial Tear Ointment (EYE LUBRICANT OP) Place 1 drop into both eyes daily.   Yes Historical Provider, MD  Ascorbic Acid (VITAMIN C) 1000 MG tablet Take 1,000 mg by mouth 3 (three) times daily.    Yes Historical Provider, MD  Calcium Carbonate-Vitamin D (CALTRATE 600+D PO) Take 600 mg by mouth 3 (three) times daily.    Yes Historical  Provider, MD  Co-Enzyme Q-10 100 MG CAPS Take 100 mg by mouth daily.   Yes Historical Provider, MD  escitalopram (LEXAPRO) 10 MG tablet Take 10 mg by mouth daily.   Yes Historical Provider, MD  Lutein-Zeaxanthin 25-5 MG CAPS Take 1 capsule by mouth daily.   Yes Historical Provider, MD  Memantine HCl-Donepezil HCl (NAMZARIC) 28-10 MG CP24 Take 1 capsule by mouth every evening.   Yes Historical Provider, MD  mirtazapine (REMERON) 30 MG tablet Take 30 mg by mouth at bedtime.   Yes Historical Provider, MD  Misc Natural Products (GLUCOSAMINE CHOND COMPLEX/MSM PO) Take 1 tablet by mouth 3 (three) times daily.    Yes Historical Provider, MD    Multiple Vitamin (MULTIVITAMIN) tablet Take 1 tablet by mouth daily.   Yes Historical Provider, MD  Omega-3 Fatty Acids (OMEGA 3 PO) Take 1 capsule by mouth 2 (two) times daily.    Yes Historical Provider, MD  polyethylene glycol (MIRALAX / GLYCOLAX) packet Take 17 g by mouth daily. With 8oz of water   Yes Historical Provider, MD  potassium chloride (K-DUR) 10 MEQ tablet Take 10 mEq by mouth daily. 11/05/13  Yes Historical Provider, MD  pravastatin (PRAVACHOL) 20 MG tablet Take 20 mg by mouth daily. 11/05/13  Yes Historical Provider, MD  RAPAFLO 8 MG CAPS capsule Take 8 mg by mouth daily. 11/05/13  Yes Historical Provider, MD  saw palmetto 500 MG capsule Take 500 mg by mouth daily.   Yes Historical Provider, MD  telmisartan (MICARDIS) 80 MG tablet Take 80 mg by mouth daily. 11/05/13  Yes Historical Provider, MD  triamcinolone (NASACORT) 55 MCG/ACT AERO nasal inhaler Place 2 sprays into the nose at bedtime.   Yes Historical Provider, MD  UNABLE TO FIND [place] tablets take 2 tablets at bedtime   Yes Historical Provider, MD  carbidopa-levodopa (SINEMET IR) 25-100 MG tablet Take 0.5 tablets by mouth 3 (three) times daily. 12/08/16   [name], MD     No Known Allergies  ROS:  Out of a complete 14 system review of symptoms, the patient complains only of the following symptoms, and all other reviewed systems are negative.  Decreased activity Incontinence of the bladder Walking difficulty Snoring Leg weakness  Blood pressure 133/67, pulse 71, height 5' 8.5" (1.74 m), weight 168 lb (76.2 kg).  Physical Exam  General: The patient is alert and cooperative at the time of the examination.  Eyes: Pupils are equal, round, and reactive to light. Discs are flat bilaterally.  Neck: The neck is supple, no carotid bruits are noted.  Respiratory: The respiratory examination is clear.  Cardiovascular: The cardiovascular examination reveals a regular rate and rhythm, no obvious murmurs or rubs  are noted.  Skin: Extremities are with 1+ edema at the ankles bilaterally.  Neurologic Exam  Mental status: The patient is alert and oriented x 3 at the time of the examination. The patient has apparent normal recent and remote memory, with an apparently normal attention span and concentration ability.  Cranial nerves: Facial symmetry is present. There is good sensation of the face to pinprick and soft touch bilaterally. The strength of the facial muscles and the muscles to head turning and shoulder shrug are normal bilaterally. Speech is well enunciated, no aphasia or dysarthria is noted. Extraocular movements are full. Visual fields are full. The tongue is midline, and the patient has symmetric elevation of the soft palate. No obvious hearing deficits are noted.  Motor: The motor testing reveals 5 over  5 strength of all 4 extremities. Good symmetric motor tone is noted throughout.  Sensory: Sensory testing is intact to pinprick, soft touch, vibration sensation, and position sense on all 4 extremities. No evidence of extinction is noted.  Coordination: Cerebellar testing reveals good finger-nose-finger and heel-to-shin bilaterally.  Gait and station: The patient has some difficulty standing up, once he is up, he is able to walk with a walker. He begins having shuffling steps with initiation of ambulation, then he will take better strides, but he is still shuffling. His feet are somewhat close together. The patient will shuffle his feet with turns. Romberg is negative, tandem gait was not attempted.  Reflexes: Deep tendon reflexes are symmetric and normal bilaterally. Toes are downgoing bilaterally.   CT head 09/01/16:  IMPRESSION: Left frontal subdural hematoma measuring up to 8 mm. Additional subdural/subarachnoid hemorrhage on the left, as described above.  No midline shift. Basal cisterns remain patent. No intraventricular hemorrhage.  No evidence of traumatic injury to the  cervical spine.  * CT scan images were reviewed online. I agree with the written report.    Assessment/Plan:  1. Gait disorder  2. Fall with head trauma, subdural hematoma, subarachnoid hemorrhage  The patient has not returned to his usual baseline with walking, but he does have a gait disorder of long duration. He appears to have a shuffling gait pattern that is similar to what a patient with Parkinson's disease would demonstrate. The patient does not have any other features of parkinsonism, however. The patient may be at a new baseline with his ability to ambulate, I will give a trial on low-dose Sinemet to see if this offers any benefit. If he gains nothing from the medication after 6 weeks, he will stop the drug. The patient will follow-up in about 4 months. Blood work will be done today.   Jill Alexanders MD 12/08/2016 2:03 PM  [facility] [address] [place], [place] [address]  Phone [phone] Fax [phone]

## 2016-12-08 NOTE — Patient Instructions (Signed)
   Sinemet (carbidopa) may result in confusion or hallucinations, drowsiness, nausea, or dizziness. If any significant side effects are noted, please contact our office. Sinemet may not be well absorbed when taken with high protein meals, if tolerated it is best to take 30-45 minutes before you eat.

## 2016-12-09 LAB — RPR: RPR: NONREACTIVE

## 2016-12-09 LAB — COPPER, SERUM: Copper: 120 ug/dL (ref 72–166)

## 2016-12-09 LAB — VITAMIN B12: VITAMIN B 12: 856 pg/mL (ref 232–1245)

## 2016-12-11 ENCOUNTER — Telehealth: Payer: Self-pay | Admitting: Neurology

## 2016-12-11 ENCOUNTER — Telehealth: Payer: Self-pay | Admitting: *Deleted

## 2016-12-11 NOTE — Telephone Encounter (Signed)
I called the patient, talk with the wife. He has had some angling in the feet, left greater than right, he does have some back issues, doubt the symptoms are related to Sinemet. They are to continue the Sinemet for now.

## 2016-12-11 NOTE — Telephone Encounter (Signed)
-----   Message from [name], MD sent at 12/10/2016  5:35 PM EST -----  The blood work results are unremarkable. Please call the patient.  ----- Message ----- From: Lavone Neri Lab Results In Sent: 12/09/2016   7:40 AM To: [name], MD

## 2016-12-11 NOTE — Telephone Encounter (Signed)
Patient's wife calling. He was prescribed carbidopa-levodopa (SINEMET IR) 25-100 MG tablet last Friday by Dr. Jannifer Franklin.  She has questions about side effects because he is having numbness in his feet especially the left foot.

## 2016-12-11 NOTE — Telephone Encounter (Signed)
Dr Willis- please advise 

## 2016-12-13 NOTE — Telephone Encounter (Signed)
Called and spoke with wife about unremarkable labs per CW,MD note. She verbalized understanding.  

## 2016-12-14 LAB — CBC AND DIFFERENTIAL
HEMATOCRIT: 36 % — AB (ref 41–53)
Hemoglobin: 12.2 g/dL — AB (ref 13.5–17.5)
PLATELETS: 303 10*3/uL (ref 150–399)
WBC: 8.8 10*3/mL

## 2016-12-14 LAB — BASIC METABOLIC PANEL
BUN: 26 mg/dL — AB (ref 4–21)
CREATININE: 1.1 mg/dL (ref 0.6–1.3)
Glucose: 176 mg/dL
Potassium: 3.9 mmol/L (ref 3.4–5.3)
Sodium: 137 mmol/L (ref 137–147)

## 2017-01-02 ENCOUNTER — Encounter: Payer: Self-pay | Admitting: Internal Medicine

## 2017-01-23 ENCOUNTER — Encounter: Payer: Self-pay | Admitting: Internal Medicine

## 2017-02-01 ENCOUNTER — Telehealth: Payer: Self-pay | Admitting: Neurology

## 2017-02-01 MED ORDER — CARBIDOPA-LEVODOPA 25-100 MG PO TABS: 0.5000 | ORAL_TABLET | Freq: Three times a day (TID) | ORAL | 2 refills | Status: DC

## 2017-02-01 NOTE — Telephone Encounter (Signed)
I called the wife. The patient is having worsening gait problems, he is taking short shuffling steps, freezing. He was given a trial on Sinemet previously but appear to keep him awake at night, they stopped the medication.  I will have the patient restart the Sinemet, we may have to treat the insomnia with other medications.  I will try to get an earlier work in revisit appointment.

## 2017-02-01 NOTE — Telephone Encounter (Signed)
Patients wife called office in reference to husband beginning to having trouble walking.  Patient will stop for a little bit steps in place as if he can't move on per wife.  Patients wife is requesting to see Dr. Jannifer Franklin sooner than his appointment 04/11/17 afraid of patient having another fall.  Please call

## 2017-02-01 NOTE — Addendum Note (Signed)
Addended by: [name] on: 02/01/2017 06:28 PM   Modules accepted: Orders

## 2017-02-05 NOTE — Telephone Encounter (Signed)
Patient has been scheduled with Dr. Jannifer Franklin 02/26/17 @ 12:00pm check in at 11:30am.

## 2017-02-05 NOTE — Telephone Encounter (Signed)
LVM for wife to call back and schedule earlier f/u in about a month per CW,MD.   *If she calls, can offer 5/14 at 12pm, check in 1130am. I can schedule appt if this works

## 2017-02-05 NOTE — Telephone Encounter (Signed)
Noted, thank you

## 2017-02-08 DIAGNOSIS — H6123 Impacted cerumen, bilateral: Secondary | ICD-10-CM | POA: Insufficient documentation

## 2017-02-08 DIAGNOSIS — H903 Sensorineural hearing loss, bilateral: Secondary | ICD-10-CM | POA: Insufficient documentation

## 2017-02-16 ENCOUNTER — Telehealth: Payer: Self-pay | Admitting: Neurology

## 2017-02-16 NOTE — Telephone Encounter (Signed)
I called the nurse, okay to increase the Sinemet to 1 full tablet 3 times daily. We will see the patient back in less than 2 weeks.

## 2017-02-16 NOTE — Telephone Encounter (Signed)
Sherry with [facility] called in reference to patients carbidopa-levodopa (SINEMET IR) 25-100 MG tablet Judeen Hammans said they have not seen any improvement since the start of the medication.  Patient is still unsteady, has a shuffled gait, and requiring a lot of assistance.  Judeen Hammans would like to know if there can be an increase in the mediation before patients appointment on 02/26/17. Per Judeen Hammans she has spoke with patients wife and she is aware of the request and agrees.  Sherrys call back number [phone] and fax # [phone]

## 2017-02-26 ENCOUNTER — Encounter (INDEPENDENT_AMBULATORY_CARE_PROVIDER_SITE_OTHER): Payer: Self-pay

## 2017-02-26 ENCOUNTER — Encounter: Payer: Self-pay | Admitting: Neurology

## 2017-02-26 ENCOUNTER — Ambulatory Visit (INDEPENDENT_AMBULATORY_CARE_PROVIDER_SITE_OTHER): Payer: Medicare Other | Admitting: Neurology

## 2017-02-26 VITALS — BP 163/85 | HR 71 | Wt 172.0 lb

## 2017-02-26 DIAGNOSIS — R269 Unspecified abnormalities of gait and mobility: Secondary | ICD-10-CM

## 2017-02-26 MED ORDER — CARBIDOPA-LEVODOPA 25-100 MG PO TABS: 1.5000 | ORAL_TABLET | Freq: Three times a day (TID) | ORAL | Status: DC

## 2017-02-26 NOTE — Patient Instructions (Signed)
   We will increase the Sinemet 25/100 mg tablet to 1.5 three times a day.    We will check CT of the brain.  Sinemet (carbidopa) may result in confusion or hallucinations, drowsiness, nausea, or dizziness. If any significant side effects are noted, please contact our office. Sinemet may not be well absorbed when taken with high protein meals, if tolerated it is best to take 30-45 minutes before you eat.

## 2017-02-26 NOTE — Progress Notes (Signed)
Reason for visit: Gait disorder  Kenneth Stark is an 83 y.o. male  History of present illness:  Kenneth Stark is an 83 year old right-handed white male with a history of a gait disorder. The patient has had a chronic gait disorder for several years, but he was very independent, driving a car, and traveling. The patient had a significant fall in November 2017 and sustained a subdural hematoma. The patient has had a significant gait disorder since that time, he shuffles his feet, he freezes with turns and when going through doors. The patient has not had a significant tremor. He denies any drooling or difficulty swallowing. He does have issues controlling the bladder, and he is on Namzaric for memory. The last CT scan of the brain that was done in January 2018 showed some mild residual blood within the lateral ventricles. The subdural hematoma had resolved nicely. The patient has been placed on Sinemet as an empiric trial, so far at 1 tablet 3 times daily of the 25/100 mg tablet has not offered any significant benefit. The patient returns for an evaluation.  Past Medical History:  Diagnosis Date  . Abnormality of gait   . Arthritis   . Brain bleed ([place]) 09/01/2016  . Cervical spondylosis   . Cervical spondylosis   . Degenerative joint disease (DJD) of lumbar spine   . Depression   . Diplopia   . Dyslipidemia   . Essential tremor   . Foul smelling urine   . Frequent falls   . GERD (gastroesophageal reflux disease)   . Glycosuria   . Hearing difficulty    hearing aids  . Hyperlipidemia   . Hypertension   . Hyperthyroidism   . Memory loss   . Osteoarthritis   . Radiculopathy, lumbar region   . 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 Small vessel disease   . TIA (transient ischemic attack)     Past Surgical History:  Procedure Laterality Date  . APPENDECTOMY     done as a child  . GALLBLADDER SURGERY  2008  . KNEE ARTHROSCOPY Left    Dr. Hart Robinsons 2002  . knee injections Right    Dr. Adriana Mccallum  . TONSILLECTOMY     done as a child    Family History  Problem Relation Age of Onset  . Stroke Mother   . Dementia Mother   . Stroke Father   . Heart disease Father   . Diabetes Father   . Dementia Brother   . Renal Disease Brother   . Diabetes Brother   . Renal Disease Daughter     Social history:  reports that he has never smoked. He has never used smokeless tobacco. He reports that he drinks alcohol. He reports that he does not use drugs.   No Known Allergies  Medications:  Prior to Admission medications   Medication Sig Start Date End Date Taking? Authorizing Provider  acetaminophen (TYLENOL) 500 MG tablet Take 1,000 mg by mouth 3 (three) times daily as needed for mild pain or moderate pain.   Yes [provider]  amLODipine (NORVASC) 10 MG tablet Take 10 mg by mouth daily.   Yes [provider]  Artificial Tear Ointment (EYE LUBRICANT OP) Place 1 drop into both eyes daily.   Yes [provider]  Ascorbic Acid (VITAMIN C) 1000 MG tablet Take 1,000 mg by mouth 3 (three) times daily.    Yes  [provider]  aspirin 81 MG tablet Take 81 mg by mouth daily.   Yes [provider]  Calcium Carb-Cholecalciferol 600-800 MG-UNIT CHEW Chew by mouth.   Yes [provider]  carbidopa-levodopa (SINEMET IR) 25-100 MG tablet Take 0.5 tablets by mouth 3 (three) times daily. Patient taking differently: Take 0.5 tablets by mouth 3 (three) times daily.  02/01/17  Yes [name], MD  Co-Enzyme Q-10 100 MG CAPS Take 100 mg by mouth daily.   Yes [provider]  escitalopram (LEXAPRO) 10 MG tablet Take 10 mg by mouth daily.   Yes [provider]  hydrochlorothiazide (HYDRODIURIL) 25 MG tablet Take 25 mg by mouth daily.   Yes [provider]  Lutein-Zeaxanthin 25-5 MG CAPS Take 1 capsule by mouth daily.   Yes [provider]  MELATONIN-CHAMOMILE PO  Take 2 tablets by mouth at bedtime.   Yes [provider]  Memantine HCl-Donepezil HCl (NAMZARIC) 28-10 MG CP24 Take 1 capsule by mouth every evening.   Yes [provider]  mirtazapine (REMERON) 30 MG tablet Take 30 mg by mouth at bedtime.   Yes [provider]  Misc Natural Products (GLUCOSAMINE CHOND COMPLEX/MSM PO) Take 1 tablet by mouth 3 (three) times daily.    Yes [provider]  Multiple Vitamin (MULTI-VITAMINS) TABS Take by mouth.   Yes [provider]  Omega-3 Fatty Acids (OMEGA 3 PO) Take 1 capsule by mouth 2 (two) times daily.    Yes [provider]  polyethylene glycol (MIRALAX / GLYCOLAX) packet Take 17 g by mouth daily. With 8oz of water   Yes [provider]  potassium chloride (K-DUR) 10 MEQ tablet Take 10 mEq by mouth daily. 11/05/13  Yes [provider]  pravastatin (PRAVACHOL) 20 MG tablet Take 20 mg by mouth daily. 11/05/13  Yes [provider]  RAPAFLO 8 MG CAPS capsule Take 8 mg by mouth daily. 11/05/13  Yes [provider]  saw palmetto 500 MG capsule Take 500 mg by mouth daily.   Yes [provider]  telmisartan (MICARDIS) 80 MG tablet Take 80 mg by mouth daily. 11/05/13  Yes [provider]  traMADol (ULTRAM) 50 MG tablet Take by mouth every 8 (eight) hours as needed.   Yes [provider]  traZODone (DESYREL) 50 MG tablet Take 50 mg by mouth at bedtime.   Yes [provider]  triamcinolone (NASACORT) 55 MCG/ACT AERO nasal inhaler Place 2 sprays into the nose at bedtime.   Yes [provider]    ROS:  Out of a complete 14 system review of symptoms, the patient complains only of the following symptoms, and all other reviewed systems are negative.  Incontinence of the bladder Anxiety  Blood pressure (!) 163/85, pulse 71, weight 172 lb (78 kg).  Physical Exam  General: The patient is alert and cooperative at the time of the  examination.  Skin: No significant peripheral edema is noted.   Neurologic Exam  Mental status: The patient is alert and oriented x 3 at the time of the examination. The patient has apparent normal recent and remote memory, with an apparently normal attention span and concentration ability.   Cranial nerves: Facial symmetry is present. Speech is normal, no aphasia or dysarthria is noted. Extraocular movements are full. Visual fields are full.  Motor: The patient has good strength in all 4 extremities.  Sensory examination: Soft touch sensation is symmetric on the face, arms, and legs.  Coordination:  The patient has good finger-nose-finger and heel-to-shin bilaterally.  Gait and station: The patient has a short shuffling gait, he has difficulty with turns, he freezes when going through doors. Tandem gait was not attempted. He does have a tendency to lean backwards. Romberg is negative. No drift is seen.  Reflexes: Deep tendon reflexes are symmetric.   Assessment/Plan:  1. Gait disorder  The patient has had some mild problems with gait instability for a number of years, but his ability to ambulate has significantly changed and worsened since the fall in November 2017. He now has short shuffling steps, freezing, urinary incontinence. The patient will be set up for a CT scan of the brain to ensure that there is not ventricular enlargement and that a normal pressure hydrocephalus syndrome is developing. The patient will be increased on the Sinemet taking 1.5 tablets 3 times daily, it is not clear that the Sinemet has helped him so far. The wife will call me in about 4-6 weeks to go over his progress, we may consider going up to 2 tablets 3 times daily of the Sinemet at that time. They are to look out for confusion, dizziness, or nausea on the medication. He will follow-up in 4 months. The patient has an Environmental consultant who is helping him walk. Most of his time is spent in a wheelchair throughout the  day.  Jill Alexanders MD 02/26/2017 12:44 PM  [facility] [address] [place], [place] [address]  Phone [phone] Fax [phone]

## 2017-03-07 ENCOUNTER — Ambulatory Visit: Payer: Medicare Other | Admitting: Neurology

## 2017-03-08 ENCOUNTER — Other Ambulatory Visit: Payer: Medicare Other

## 2017-03-08 ENCOUNTER — Ambulatory Visit
Admission: RE | Admit: 2017-03-08 | Discharge: 2017-03-08 | Disposition: A | Discharge status: Home / Self Care | Payer: Medicare Other | Source: Ambulatory Visit | Attending: Neurology | Admitting: Neurology

## 2017-03-08 DIAGNOSIS — R269 Unspecified abnormalities of gait and mobility: Secondary | ICD-10-CM

## 2017-03-11 ENCOUNTER — Telehealth: Payer: Self-pay | Admitting: Neurology

## 2017-03-11 NOTE — Telephone Encounter (Signed)
I called patient, talk with the wife. The CT the head does not show ventriculomegaly or any other issue that would cause his walking to deteriorate. The wife believes that he is starting to respond to the Sinemet increase, we will continue to follow.  CT head 03/09/17:  IMPRESSION:  CT scan of the head without contrast shows the following: 1.    Small left convexity subdural fluid collection, unchanged in thickness compared to the 10/20/2016 CT scan. 2.    Moderate cortical atrophy and chronic microvascular ischemic change, unchanged when compared to the previous CT scan.

## 2017-03-29 ENCOUNTER — Telehealth: Payer: Self-pay | Admitting: Neurology

## 2017-03-29 MED ORDER — CARBIDOPA-LEVODOPA 25-100 MG PO TABS: 2.0000 | ORAL_TABLET | Freq: Three times a day (TID) | ORAL | Status: DC

## 2017-03-29 NOTE — Telephone Encounter (Signed)
Pt wife calling to inform that pt is tolerating the carbidopa-levodopa (SINEMET IR) 25-100 MG tablet well, pt wife said she was told that if he was doing well there would be an increase from 1 1/2 a day to 2 full tablets 3 times a day, pt wife asking that [facility]  Nurses station 2nd floor be contacted with change in medication [phone]

## 2017-03-29 NOTE — Telephone Encounter (Signed)
Dr Jannifer Franklin- okay for pt to increase to 2 tablets 3x/day?

## 2017-03-29 NOTE — Telephone Encounter (Signed)
I called and left a message, the patient is tolerating the Sinemet, we will go up to 2 full tablets 3 times daily. I will call in a prescription to cover this.  I called the nurses station and gait orders for this education increase. Number there is [phone]

## 2017-03-29 NOTE — Addendum Note (Signed)
Addended by: [name] on: 03/29/2017 05:31 PM   Modules accepted: Orders

## 2017-04-04 ENCOUNTER — Non-Acute Institutional Stay: Payer: Medicare Other | Admitting: Internal Medicine

## 2017-04-04 ENCOUNTER — Encounter: Payer: Self-pay | Admitting: Internal Medicine

## 2017-04-04 VITALS — BP 150/70 | HR 68 | Temp 98.5°F | Ht 69.0 in | Wt 177.0 lb

## 2017-04-04 DIAGNOSIS — I10 Essential (primary) hypertension: Secondary | ICD-10-CM

## 2017-04-04 DIAGNOSIS — E785 Hyperlipidemia, unspecified: Secondary | ICD-10-CM

## 2017-04-04 DIAGNOSIS — N401 Enlarged prostate with lower urinary tract symptoms: Secondary | ICD-10-CM | POA: Diagnosis not present

## 2017-04-04 DIAGNOSIS — S065XAA Traumatic subdural hemorrhage with loss of consciousness status unknown, initial encounter: Secondary | ICD-10-CM

## 2017-04-04 DIAGNOSIS — R269 Unspecified abnormalities of gait and mobility: Secondary | ICD-10-CM | POA: Diagnosis not present

## 2017-04-04 DIAGNOSIS — S065X9A Traumatic subdural hemorrhage with loss of consciousness of unspecified duration, initial encounter: Secondary | ICD-10-CM

## 2017-04-04 DIAGNOSIS — F0150 Vascular dementia without behavioral disturbance: Secondary | ICD-10-CM

## 2017-04-04 DIAGNOSIS — G20C Parkinsonism, unspecified: Secondary | ICD-10-CM | POA: Insufficient documentation

## 2017-04-04 DIAGNOSIS — I6200 Nontraumatic subdural hemorrhage, unspecified: Secondary | ICD-10-CM | POA: Diagnosis not present

## 2017-04-04 DIAGNOSIS — I609 Nontraumatic subarachnoid hemorrhage, unspecified: Secondary | ICD-10-CM | POA: Diagnosis not present

## 2017-04-04 DIAGNOSIS — F0280 Dementia in other diseases classified elsewhere without behavioral disturbance: Secondary | ICD-10-CM

## 2017-04-04 DIAGNOSIS — G20 Parkinson's disease: Secondary | ICD-10-CM | POA: Insufficient documentation

## 2017-04-04 DIAGNOSIS — G214 Vascular parkinsonism: Secondary | ICD-10-CM | POA: Diagnosis not present

## 2017-04-04 DIAGNOSIS — R351 Nocturia: Secondary | ICD-10-CM

## 2017-04-04 DIAGNOSIS — G309 Alzheimer's disease, unspecified: Secondary | ICD-10-CM

## 2017-04-04 MED ORDER — NEBIVOLOL HCL 5 MG PO TABS: 5.0000 mg | ORAL_TABLET | Freq: Every day | ORAL | 5 refills | Status: DC

## 2017-04-04 NOTE — Progress Notes (Signed)
Provider:  Rexene Edison. Mariea Clonts, D.O., C.M.D. Location:  Occupational psychologist of Service:  Clinic (12)  Previous PCP: [name], DO Patient Care Team: [name], DO as PCP - General (Geriatric Medicine) Danella Sensing, MD as Consulting Physician (Dermatology) Sharyne Peach, MD as Consulting Physician (Ophthalmology) Irine Seal, MD as Attending Physician (Urology) [name], MD as Consulting Physician (Neurology)  Extended Emergency Contact Information Primary Emergency Contact: Smith,[name] Address: [address]          [place] [address] [name] of [place] Phone: [phone] Relation: Spouse Secondary Emergency Contact: [place] of [place] Mobile Phone: [phone] Relation: Daughter  Code Status: DNR Goals of Care: Advanced Directive information Advanced Directives 04/04/2017  Does Patient Have a Medical Advance Directive? Yes  Type of Advance Directive Out of facility DNR (pink MOST or yellow form);[place];Living will  Does patient want to make changes to medical advance directive? -  Copy of [place] in Chart? Yes  Would patient like information on creating a medical advance directive? -  Pre-existing out of facility DNR order (yellow form or pink MOST form) Yellow form placed in chart (order not valid for inpatient use)   Chief Complaint  Patient presents with  . Establish Care    new patient  . power mobility chair evaluation face to face    HPI: Patient is a 83 y.o. male seen today to establish with [facility].  Records have been requested and received from Dr. Reynaldo Minium.  He's been in enhanced AL for 3 mos after a stay in rehab for a SAH and SDH related to fall with left ear injury.  Records were reviewed by me and new patient packet abstracted by staff.  Doing well. Has the support he needs in enhanced  AL.    He'd love to be able to walk again. He came in a transport chair. He is walking three days a week with a CNA for an hour--Mon/Wed/Fri--requires gait belt to hold him up and at least one person.  He has had a couple of missteps where he fell down but does not recall them (wife and notes indicate).    No signfiicant pain.  Sleeps well.  Appetite too good.  He'd been home 2.5 weeks after rehab, but it was hard to transfer him bed to chair etc for his wife  Children decided that he needed to move b/c his wife's back was not doing well.  They help with meds, dressing, bathing.  Bowels move well.  Has urinary incontinence--mow using condom cath at night b/c he was not waking up to go and having accidents in the bed.  Using depends in daytimes.  Reports awareness of needing to urinate during the day, but usually does not make it in time anyway per wife.   BP 150/70 today.  They take it several times per day at AL--readings reviewed: systolic [id number] majority of time, a couple in 140s.      Saw Dr. Jannifer Franklin 4-5 weeks ago and sinemet was just increased last week.  Not clear yet if this has made a difference.  Mr. Cuthbertson feels better about it.  Also does bicycling and balance exercises, as well with assistance only.  He's on namzaric now for his memory.  Has short term memory loss.  Has functional dependence which is multifactorial (physical and cognitive).    On lexapro and  remeron for his mood.  Not fighting anybody.  Does not appear tearful or down today.  Very pleasant and well-groomed.  Is on hctz for bp.  On it the whole time.    I have reviewed and concur with OT evaluation performed by Eual Fines, OT.  He continues to require a power wheelchair to go to meals and remain mobile throughout the community.  He's unable to ambulate any distances unassisted.  He cannot functionally use the cane or walker independently.  He also cannot self-propel a manual wheelchair over carpets or long  distances.  A scooter is not an option b/c of difficulties transferring on and off of a scooter. It needs to be a group 3 power wheelchair b/c of his neurologic condition.  If he's unable to pressure release if his diagnosis progresses, he will need tilting to assist with his condition.  He's had a prior Stage 2 pressure ulcer on his buttocks so he will need a supportive cushion to prevent recurrence.    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  Then he was on [employer]'s board for 9 years.    Past Medical History:  Diagnosis Date  . Abnormality of gait   . Arthritis   . Brain bleed ([place]) 09/01/2016  . Cervical spondylosis   . Cervical spondylosis   . Degenerative joint disease (DJD) of lumbar spine   . Depression   . Diplopia   . Dyslipidemia   . Essential tremor   . Foul smelling urine   . Frequent falls   . GERD (gastroesophageal reflux disease)   . Glycosuria   . Hearing difficulty    hearing aids  . Hyperlipidemia   . Hypertension   . Hyperthyroidism   . Memory loss   . Osteoarthritis   . Radiculopathy, lumbar region   . 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 Small vessel disease   . TIA (transient ischemic attack)    Past Surgical History:  Procedure Laterality Date  . APPENDECTOMY     done as a child  . GALLBLADDER SURGERY  2008  . KNEE ARTHROSCOPY Left    Dr. Hart Robinsons 2002  . knee injections Right    Dr. Adriana Mccallum  . TONSILLECTOMY     done as a child    Social History   Social History  . Marital status: Married    Spouse name: [name]  . Number of children: 4  . Years of education: N/A   Occupational History  . Retired     [employer]   Social History Main Topics  . Smoking status: Never Smoker  . Smokeless tobacco: Never Used  . Alcohol use Yes     Comment: Wife occass/rare  . Drug use: No  . Sexual activity: Not Asked   Other Topics Concern  . None   Social History  Narrative   Tobacco use, amount per day now: NEVER   Past tobacco use, amount per day: never    How many years did you use tobacco:   Alcohol use (drinks per week): OCCASIONAL GLASS OF WINE   [place], LOW FAT   Do you drink/eat things with caffeine:COFFEE IN THE MORNING   Marital status:    MARRIED  What year were you married? 1951   Do you live in a house, apartment, assisted living, condo, trailer, etc.? Assisted living    Is it one or more stories?   How many persons live in your home?    Do you have pets in your home?( please list) NO   Highest level of education completed: [employer] classes    Current or past profession: [employer].   Do you exercise?    Walking    Type and how often? 3 times a week    Do you have a living will? YES   Do you have a DNR form?  YES                                 If not, do you want to discuss one?   Do you have signed POA/HPOA forms?  YES                      If so, please bring to you appointment      Has difficulty bathing or dressing self   Has difficulty preparing food    Has difficulty managing medications   Has difficulty managing finances   Does not have any difficulty affording medications        reports that he has never smoked. He has never used smokeless tobacco. He reports that he drinks alcohol. He reports that he does not use drugs.  Functional Status Survey:    Family History  Problem Relation Age of Onset  . Stroke Mother   . Dementia Mother   . Stroke Father   . Heart disease Father   . Diabetes Father   . Dementia Brother   . Renal Disease Brother   . Diabetes Brother   . Renal Disease Daughter     Health Maintenance  Topic Date Due  . PNA vac Low Risk Adult (2 of 2 - PCV13) 03/17/2017  . INFLUENZA VACCINE  05/16/2017  . TETANUS/TDAP  09/01/2026    No Known Allergies  Allergies as of 04/04/2017   No Known  Allergies     Medication List       Accurate as of 04/04/17  5:29 PM. Always use your most recent med list.          aspirin 81 MG tablet Take 81 mg by mouth daily.   Calcium Carb-Cholecalciferol 600-800 MG-UNIT Chew Chew by mouth.   carbidopa-levodopa 25-100 MG tablet Commonly known as:  SINEMET IR Take 2 tablets by mouth 3 (three) times daily.   clopidogrel 75 MG tablet Commonly known as:  PLAVIX Take 75 mg by mouth daily.   Co-Enzyme Q-10 100 MG Caps Take 100 mg by mouth daily.   escitalopram 10 MG tablet Commonly known as:  LEXAPRO Take 10 mg by mouth daily.   GLUCOSAMINE CHOND COMPLEX/MSM PO Take 1 tablet by mouth 3 (three) times daily.   Lutein-Zeaxanthin 25-5 MG Caps Take 1 capsule by mouth daily.   mirtazapine 30 MG tablet Commonly known as:  REMERON Take 30 mg by mouth at bedtime.   MULTI-VITAMINS Tabs Take by mouth.   NAMZARIC 28-10 MG Cp24 Generic drug:  Memantine HCl-Donepezil HCl Take 1 capsule by mouth every evening.   OMEGA 3 PO Take 1 capsule by mouth 2 (two) times daily.   potassium chloride 10 MEQ tablet Commonly known  as:  K-DUR Take 10 mEq by mouth daily.   pravastatin 20 MG tablet Commonly known as:  PRAVACHOL Take 20 mg by mouth daily.   saw palmetto 500 MG capsule Take 500 mg by mouth daily.   telmisartan 80 MG tablet Commonly known as:  MICARDIS Take 80 mg by mouth daily.   traMADol 50 MG tablet Commonly known as:  ULTRAM Take by mouth every 8 (eight) hours as needed.   triamcinolone 55 MCG/ACT Aero nasal inhaler Commonly known as:  NASACORT Place 2 sprays into the nose at bedtime.   vitamin C 1000 MG tablet Take 1,000 mg by mouth 3 (three) times daily.       Review of Systems  Constitutional: Negative for chills, fever, malaise/fatigue and weight loss.  HENT: Positive for hearing loss.        Bilateral hearing aids for many years  Eyes: Negative for blurred vision.       Glasses, follows closely with ophtho    Respiratory: Negative for cough and shortness of breath.   Cardiovascular: Negative for chest pain, palpitations and leg swelling.  Gastrointestinal: Negative for abdominal pain, blood in stool, constipation, diarrhea and melena.  Genitourinary: Positive for frequency and urgency. Negative for dysuria.       Incontinence, uses condom cath at hs and depends in daytime  Musculoskeletal: Positive for falls. Negative for back pain and joint pain.       Weakness of lower extremities  Skin: Negative for itching and rash.  Neurological: Positive for weakness. Negative for dizziness and loss of consciousness.  Endo/Heme/Allergies: Bruises/bleeds easily.  Psychiatric/Behavioral: Positive for memory loss. Negative for depression.    Vitals:   04/04/17 1440  BP: (!) 150/70  Pulse: 68  Temp: 98.5 F (36.9 C)  TempSrc: Oral  SpO2: 96%  Weight: 177 lb (80.3 kg)  Height: 5\' 9"  (1.753 m)   Body mass index is 26.14 kg/m. Physical Exam  Constitutional: He appears well-developed and well-nourished. No distress.  HENT:  Head: Normocephalic.  Right Ear: External ear normal.  Nose: Nose normal.  Mouth/Throat: Oropharynx is clear and moist. No oropharyngeal exudate.  Left ear deformity s/p injury  Eyes: Conjunctivae and EOM are normal. Pupils are equal, round, and reactive to light.  glasses  Neck: Normal range of motion. Neck supple. No JVD present.  Cardiovascular: Normal rate, regular rhythm, normal heart sounds and intact distal pulses.   Pulmonary/Chest: Effort normal and breath sounds normal. No respiratory distress. He has no rales.  Abdominal: Soft. Bowel sounds are normal. He exhibits no distension. There is no tenderness.  Musculoskeletal: Normal range of motion. He exhibits no edema, tenderness or deformity.  Diminished strength in LE hip flexors to 3/5 to support self to stand, UE 5/5 grips for me; see OT notes for clarification  Lymphadenopathy:    He has no cervical adenopathy.   Neurological: He is alert. He exhibits abnormal muscle tone. Coordination abnormal.  Oriented to person and place, not time, poor historian and some aphasia  Skin: Skin is warm and dry. Capillary refill takes less than 2 seconds.  Psychiatric: He has a normal mood and affect.    Labs reviewed: Basic Metabolic Panel:  Recent Labs  09/03/16 0235  09/07/16 1402 09/07/16 1844  09/09/16 0548 09/12/16 0212 09/15/16 0555 09/22/16 12/14/16  NA 138  < >  --   --   < > 137 136 135 137 137  K 2.8*  < >  --   --   < >  3.9 3.4* 3.6 4.3 3.9  CL 103  < >  --   --   < > 104 104 100*  --   --   CO2 26  < >  --   --   < > 24 25 24   --   --   GLUCOSE 195*  < >  --   --   < > 191* 129* 138*  --   --   BUN 19  < >  --   --   < > 21* 17 15 20  26*  CREATININE 1.01  < >  --   --   < > 0.97 0.71 0.87 0.8 1.1  CALCIUM 9.2  < >  --   --   < > 8.8* 8.6* 9.1  --   --   MG 1.9  --  1.8  --   --   --   --   --   --   --   PHOS  --   --   --  3.2  --   --   --   --   --   --   < > = values in this interval not displayed. Liver Function Tests:  Recent Labs  09/07/16 1300 09/08/16 0603  AST 42* 40  ALT 53 53  ALKPHOS 51 44  BILITOT 0.5 0.5  PROT 5.7* 5.3*  ALBUMIN 2.9* 2.6*   No results for input(s): LIPASE, AMYLASE in the last 8760 hours.  Recent Labs  09/09/16 1223  AMMONIA 19   CBC:  Recent Labs  09/01/16 1201  09/07/16 1300 09/08/16 0603 09/09/16 0548 09/12/16 0212 10/18/16 12/14/16  WBC 17.4*  < > 12.1* 11.2* 10.9* 10.5 6.4 8.8  NEUTROABS 15.2*  --  8.9*  --   --  6.8  --   --   HGB 12.0*  < > 9.4* 9.1* 9.9* 10.4* 11.3* 12.2*  HCT 35.8*  < > 27.4* 27.0* 29.1* 31.0* 34* 36*  MCV 92.0  < > 90.7 91.2 90.4 91.7  --   --   PLT 220  < > 255 235 297 359  --  303  < > = values in this interval not displayed. Cardiac Enzymes: No results for input(s): CKTOTAL, CKMB, CKMBINDEX, TROPONINI in the last 8760 hours. BNP: Invalid input(s): POCBNP Lab Results  Component Value Date   HGBA1C  6.8 (H) 09/07/2016   Lab Results  Component Value Date   TSH 1.57 03/10/2016   Lab Results  Component Value Date   [id number] 12/08/2016   Assessment/Plan 1. Subarachnoid hemorrhage (HCC) 2. Subdural hematoma ([place]) -has made great strides after above, but continues to require ADL assistance with all except feeding himself -see HPI for details about need for power wheelchair to help with getting to meals and performing adls -see also OT note for collaboration  3. Mixed Alzheimer's and vascular dementia -progressing gradually since 2014 it appears, has been on aricept and then namenda added in the recent past -cont stimulatory activities and exercise   4. Essential hypertension -bp elevated and review of others also showed elevation -pt on hctz which can worsen urinary frequency which has made it necessary for condom cath use at hs (increasing infection risk, but decreasing pressure ulcer risk--not wet as much) -will d/c hctz -begin bystolic 5mg  daily -check bp MANUALLY each day for one week, report results to Dr. 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 Jannifer Franklin and follows with neurology  6. Benign prostatic hyperplasia with nocturia -f/u with Dr. Jeffie Pollock, for now cont condom cath at hs, but hoping d/cing diuretic will help and it won't be necessary   7. Abnormality of gait -ongoing, would benefit greatly from power chair to allow him to get to meals and other activities and participate more in his adls  8. Dyslipidemia -needs f/u flp, cont current therapy with pravachol as tolerating well at this point and not having problems taking pills  Labs/tests ordered: bmp, flp, f/u urology  Rose Hegner L. Jacobie Stamey, D.O. [facility] [address], [place] [address] Cell Phone (Mon-Fri 8am-5pm):   [phone] On Call:  [phone] & follow prompts after 5pm & weekends Office Phone:  [phone] Office Fax:  [phone]

## 2017-04-06 DIAGNOSIS — R3914 Feeling of incomplete bladder emptying: Secondary | ICD-10-CM | POA: Diagnosis not present

## 2017-04-06 DIAGNOSIS — N200 Calculus of kidney: Secondary | ICD-10-CM | POA: Diagnosis not present

## 2017-04-06 DIAGNOSIS — N401 Enlarged prostate with lower urinary tract symptoms: Secondary | ICD-10-CM | POA: Diagnosis not present

## 2017-04-06 DIAGNOSIS — N281 Cyst of kidney, acquired: Secondary | ICD-10-CM | POA: Diagnosis not present

## 2017-04-11 ENCOUNTER — Ambulatory Visit: Payer: Medicare Other | Admitting: Neurology

## 2017-04-16 DIAGNOSIS — M6259 Muscle wasting and atrophy, not elsewhere classified, multiple sites: Secondary | ICD-10-CM | POA: Diagnosis not present

## 2017-04-16 DIAGNOSIS — R260 Ataxic gait: Secondary | ICD-10-CM | POA: Diagnosis not present

## 2017-04-16 DIAGNOSIS — R278 Other lack of coordination: Secondary | ICD-10-CM | POA: Diagnosis not present

## 2017-04-16 DIAGNOSIS — R2689 Other abnormalities of gait and mobility: Secondary | ICD-10-CM | POA: Diagnosis not present

## 2017-04-17 DIAGNOSIS — R2689 Other abnormalities of gait and mobility: Secondary | ICD-10-CM | POA: Diagnosis not present

## 2017-04-17 DIAGNOSIS — M6259 Muscle wasting and atrophy, not elsewhere classified, multiple sites: Secondary | ICD-10-CM | POA: Diagnosis not present

## 2017-04-17 DIAGNOSIS — R260 Ataxic gait: Secondary | ICD-10-CM | POA: Diagnosis not present

## 2017-04-17 DIAGNOSIS — R278 Other lack of coordination: Secondary | ICD-10-CM | POA: Diagnosis not present

## 2017-04-18 DIAGNOSIS — R260 Ataxic gait: Secondary | ICD-10-CM | POA: Diagnosis not present

## 2017-04-18 DIAGNOSIS — R2689 Other abnormalities of gait and mobility: Secondary | ICD-10-CM | POA: Diagnosis not present

## 2017-04-18 DIAGNOSIS — R278 Other lack of coordination: Secondary | ICD-10-CM | POA: Diagnosis not present

## 2017-04-18 DIAGNOSIS — M6259 Muscle wasting and atrophy, not elsewhere classified, multiple sites: Secondary | ICD-10-CM | POA: Diagnosis not present

## 2017-04-19 ENCOUNTER — Ambulatory Visit: Payer: Medicare Other | Admitting: Neurology

## 2017-04-19 DIAGNOSIS — R278 Other lack of coordination: Secondary | ICD-10-CM | POA: Diagnosis not present

## 2017-04-19 DIAGNOSIS — M6259 Muscle wasting and atrophy, not elsewhere classified, multiple sites: Secondary | ICD-10-CM | POA: Diagnosis not present

## 2017-04-19 DIAGNOSIS — R2689 Other abnormalities of gait and mobility: Secondary | ICD-10-CM | POA: Diagnosis not present

## 2017-04-19 DIAGNOSIS — R260 Ataxic gait: Secondary | ICD-10-CM | POA: Diagnosis not present

## 2017-04-23 DIAGNOSIS — R2689 Other abnormalities of gait and mobility: Secondary | ICD-10-CM | POA: Diagnosis not present

## 2017-04-23 DIAGNOSIS — R278 Other lack of coordination: Secondary | ICD-10-CM | POA: Diagnosis not present

## 2017-04-23 DIAGNOSIS — R260 Ataxic gait: Secondary | ICD-10-CM | POA: Diagnosis not present

## 2017-04-23 DIAGNOSIS — M6259 Muscle wasting and atrophy, not elsewhere classified, multiple sites: Secondary | ICD-10-CM | POA: Diagnosis not present

## 2017-04-24 DIAGNOSIS — R2689 Other abnormalities of gait and mobility: Secondary | ICD-10-CM | POA: Diagnosis not present

## 2017-04-24 DIAGNOSIS — R260 Ataxic gait: Secondary | ICD-10-CM | POA: Diagnosis not present

## 2017-04-24 DIAGNOSIS — M6259 Muscle wasting and atrophy, not elsewhere classified, multiple sites: Secondary | ICD-10-CM | POA: Diagnosis not present

## 2017-04-24 DIAGNOSIS — R278 Other lack of coordination: Secondary | ICD-10-CM | POA: Diagnosis not present

## 2017-04-25 DIAGNOSIS — M6259 Muscle wasting and atrophy, not elsewhere classified, multiple sites: Secondary | ICD-10-CM | POA: Diagnosis not present

## 2017-04-25 DIAGNOSIS — R2689 Other abnormalities of gait and mobility: Secondary | ICD-10-CM | POA: Diagnosis not present

## 2017-04-25 DIAGNOSIS — R260 Ataxic gait: Secondary | ICD-10-CM | POA: Diagnosis not present

## 2017-04-25 DIAGNOSIS — R278 Other lack of coordination: Secondary | ICD-10-CM | POA: Diagnosis not present

## 2017-04-26 DIAGNOSIS — M6259 Muscle wasting and atrophy, not elsewhere classified, multiple sites: Secondary | ICD-10-CM | POA: Diagnosis not present

## 2017-04-26 DIAGNOSIS — R278 Other lack of coordination: Secondary | ICD-10-CM | POA: Diagnosis not present

## 2017-04-26 DIAGNOSIS — R260 Ataxic gait: Secondary | ICD-10-CM | POA: Diagnosis not present

## 2017-04-26 DIAGNOSIS — R2689 Other abnormalities of gait and mobility: Secondary | ICD-10-CM | POA: Diagnosis not present

## 2017-05-01 DIAGNOSIS — M6259 Muscle wasting and atrophy, not elsewhere classified, multiple sites: Secondary | ICD-10-CM | POA: Diagnosis not present

## 2017-05-01 DIAGNOSIS — R278 Other lack of coordination: Secondary | ICD-10-CM | POA: Diagnosis not present

## 2017-05-01 DIAGNOSIS — R2689 Other abnormalities of gait and mobility: Secondary | ICD-10-CM | POA: Diagnosis not present

## 2017-05-01 DIAGNOSIS — R260 Ataxic gait: Secondary | ICD-10-CM | POA: Diagnosis not present

## 2017-05-02 DIAGNOSIS — R260 Ataxic gait: Secondary | ICD-10-CM | POA: Diagnosis not present

## 2017-05-02 DIAGNOSIS — M6259 Muscle wasting and atrophy, not elsewhere classified, multiple sites: Secondary | ICD-10-CM | POA: Diagnosis not present

## 2017-05-02 DIAGNOSIS — R2689 Other abnormalities of gait and mobility: Secondary | ICD-10-CM | POA: Diagnosis not present

## 2017-05-02 DIAGNOSIS — R278 Other lack of coordination: Secondary | ICD-10-CM | POA: Diagnosis not present

## 2017-05-03 DIAGNOSIS — R2689 Other abnormalities of gait and mobility: Secondary | ICD-10-CM | POA: Diagnosis not present

## 2017-05-03 DIAGNOSIS — R260 Ataxic gait: Secondary | ICD-10-CM | POA: Diagnosis not present

## 2017-05-03 DIAGNOSIS — M6259 Muscle wasting and atrophy, not elsewhere classified, multiple sites: Secondary | ICD-10-CM | POA: Diagnosis not present

## 2017-05-03 DIAGNOSIS — R278 Other lack of coordination: Secondary | ICD-10-CM | POA: Diagnosis not present

## 2017-05-04 DIAGNOSIS — R278 Other lack of coordination: Secondary | ICD-10-CM | POA: Diagnosis not present

## 2017-05-04 DIAGNOSIS — R260 Ataxic gait: Secondary | ICD-10-CM | POA: Diagnosis not present

## 2017-05-04 DIAGNOSIS — R2689 Other abnormalities of gait and mobility: Secondary | ICD-10-CM | POA: Diagnosis not present

## 2017-05-04 DIAGNOSIS — M6259 Muscle wasting and atrophy, not elsewhere classified, multiple sites: Secondary | ICD-10-CM | POA: Diagnosis not present

## 2017-05-07 DIAGNOSIS — R260 Ataxic gait: Secondary | ICD-10-CM | POA: Diagnosis not present

## 2017-05-07 DIAGNOSIS — R278 Other lack of coordination: Secondary | ICD-10-CM | POA: Diagnosis not present

## 2017-05-07 DIAGNOSIS — R2689 Other abnormalities of gait and mobility: Secondary | ICD-10-CM | POA: Diagnosis not present

## 2017-05-07 DIAGNOSIS — M6259 Muscle wasting and atrophy, not elsewhere classified, multiple sites: Secondary | ICD-10-CM | POA: Diagnosis not present

## 2017-05-08 ENCOUNTER — Telehealth: Payer: Self-pay | Admitting: Neurology

## 2017-05-08 DIAGNOSIS — M6259 Muscle wasting and atrophy, not elsewhere classified, multiple sites: Secondary | ICD-10-CM | POA: Diagnosis not present

## 2017-05-08 DIAGNOSIS — R278 Other lack of coordination: Secondary | ICD-10-CM | POA: Diagnosis not present

## 2017-05-08 DIAGNOSIS — R2689 Other abnormalities of gait and mobility: Secondary | ICD-10-CM | POA: Diagnosis not present

## 2017-05-08 DIAGNOSIS — R260 Ataxic gait: Secondary | ICD-10-CM | POA: Diagnosis not present

## 2017-05-08 NOTE — Telephone Encounter (Signed)
Pt wife calling to inform that Pt was denied a powerchair and she'd like to know if there is anything Dr Jannifer Franklin can do re: that.  Pt wife states that pt is having a lot more difficulty in walking.  Pt wife also wanted Dr Jannifer Franklin to be aware that the staff are not giving pt is medication 30 mins before meal time but instead giving it to him right before meals, please call

## 2017-05-08 NOTE — Telephone Encounter (Signed)
I called the wife. The patient is on Sinemet taking 2 tablets 3 times daily, getting this in 30 minutes before he eats is important as if he takes it with a meal it may lose its effectiveness.  The patient had a mobility assessment through physical therapy, the level III power chair was denied, reason is not clear, I asked her to go back to the physical therapy group that did the assessment and try to find out what happened.

## 2017-05-09 DIAGNOSIS — R260 Ataxic gait: Secondary | ICD-10-CM | POA: Diagnosis not present

## 2017-05-09 DIAGNOSIS — R278 Other lack of coordination: Secondary | ICD-10-CM | POA: Diagnosis not present

## 2017-05-09 DIAGNOSIS — R2689 Other abnormalities of gait and mobility: Secondary | ICD-10-CM | POA: Diagnosis not present

## 2017-05-09 DIAGNOSIS — M6259 Muscle wasting and atrophy, not elsewhere classified, multiple sites: Secondary | ICD-10-CM | POA: Diagnosis not present

## 2017-05-10 DIAGNOSIS — R260 Ataxic gait: Secondary | ICD-10-CM | POA: Diagnosis not present

## 2017-05-10 DIAGNOSIS — R2689 Other abnormalities of gait and mobility: Secondary | ICD-10-CM | POA: Diagnosis not present

## 2017-05-10 DIAGNOSIS — R278 Other lack of coordination: Secondary | ICD-10-CM | POA: Diagnosis not present

## 2017-05-10 DIAGNOSIS — M6259 Muscle wasting and atrophy, not elsewhere classified, multiple sites: Secondary | ICD-10-CM | POA: Diagnosis not present

## 2017-05-11 DIAGNOSIS — E039 Hypothyroidism, unspecified: Secondary | ICD-10-CM | POA: Diagnosis not present

## 2017-05-11 LAB — TSH: TSH: 5.18 (ref <=5.90)

## 2017-05-11 LAB — BASIC METABOLIC PANEL
BUN: 20 (ref 4–21)
CREATININE: 0.9 (ref 0.6–1.3)
Glucose: 125
POTASSIUM: 3.9 (ref 3.4–5.3)
Sodium: 140 (ref 137–147)

## 2017-05-11 LAB — LIPID PANEL
Cholesterol: 127 (ref 0–200)
HDL: 50 (ref 35–70)
LDL CALC: 58
Triglycerides: 98 (ref 40–160)

## 2017-05-14 DIAGNOSIS — R2689 Other abnormalities of gait and mobility: Secondary | ICD-10-CM | POA: Diagnosis not present

## 2017-05-14 DIAGNOSIS — M6259 Muscle wasting and atrophy, not elsewhere classified, multiple sites: Secondary | ICD-10-CM | POA: Diagnosis not present

## 2017-05-14 DIAGNOSIS — R260 Ataxic gait: Secondary | ICD-10-CM | POA: Diagnosis not present

## 2017-05-14 DIAGNOSIS — R278 Other lack of coordination: Secondary | ICD-10-CM | POA: Diagnosis not present

## 2017-05-14 NOTE — Telephone Encounter (Signed)
[name] with [facility] is calling to discuss changing dosage time for carbidopa-levodopa (SINEMET IR) 25-100 MG tablet.

## 2017-05-15 DIAGNOSIS — R2689 Other abnormalities of gait and mobility: Secondary | ICD-10-CM | POA: Diagnosis not present

## 2017-05-15 DIAGNOSIS — R260 Ataxic gait: Secondary | ICD-10-CM | POA: Diagnosis not present

## 2017-05-15 DIAGNOSIS — M6259 Muscle wasting and atrophy, not elsewhere classified, multiple sites: Secondary | ICD-10-CM | POA: Diagnosis not present

## 2017-05-15 DIAGNOSIS — R278 Other lack of coordination: Secondary | ICD-10-CM | POA: Diagnosis not present

## 2017-05-15 NOTE — Telephone Encounter (Signed)
Called and spoke with wife, [name]. Offered appt for 05/22/17 at 12pm, check in 1130am but she declined. She has appt already that day. Made appt for 05/25/17 at 12pm, check in 1130am, She is agreeable to this date and time. She will call [facility] and notify them of appt made.

## 2017-05-15 NOTE — Telephone Encounter (Signed)
I called [name] and gave her orders for the Sinemet dosing. She claims that he has not done well with with physical therapy, his mobility has significant decline, I will try to get him worked in next couple weeks.

## 2017-05-16 ENCOUNTER — Telehealth: Payer: Self-pay | Admitting: *Deleted

## 2017-05-16 DIAGNOSIS — G214 Vascular parkinsonism: Secondary | ICD-10-CM | POA: Diagnosis not present

## 2017-05-16 DIAGNOSIS — M25662 Stiffness of left knee, not elsewhere classified: Secondary | ICD-10-CM | POA: Diagnosis not present

## 2017-05-16 DIAGNOSIS — M6259 Muscle wasting and atrophy, not elsewhere classified, multiple sites: Secondary | ICD-10-CM | POA: Diagnosis not present

## 2017-05-16 DIAGNOSIS — M47812 Spondylosis without myelopathy or radiculopathy, cervical region: Secondary | ICD-10-CM | POA: Diagnosis not present

## 2017-05-16 DIAGNOSIS — M25661 Stiffness of right knee, not elsewhere classified: Secondary | ICD-10-CM | POA: Diagnosis not present

## 2017-05-16 DIAGNOSIS — M25652 Stiffness of left hip, not elsewhere classified: Secondary | ICD-10-CM | POA: Diagnosis not present

## 2017-05-16 DIAGNOSIS — M25651 Stiffness of right hip, not elsewhere classified: Secondary | ICD-10-CM | POA: Diagnosis not present

## 2017-05-16 DIAGNOSIS — R2689 Other abnormalities of gait and mobility: Secondary | ICD-10-CM | POA: Diagnosis not present

## 2017-05-16 DIAGNOSIS — R260 Ataxic gait: Secondary | ICD-10-CM | POA: Diagnosis not present

## 2017-05-16 DIAGNOSIS — R278 Other lack of coordination: Secondary | ICD-10-CM | POA: Diagnosis not present

## 2017-05-16 DIAGNOSIS — Z8673 Personal history of transient ischemic attack (TIA), and cerebral infarction without residual deficits: Secondary | ICD-10-CM | POA: Diagnosis not present

## 2017-05-16 NOTE — Telephone Encounter (Signed)
Debbie with [facility] called and stated that she spoke with someone on July 19th about doing an appeal for patient to get Power Wheelchair. She is calling to follow up that process. Please Advise.

## 2017-05-17 DIAGNOSIS — G214 Vascular parkinsonism: Secondary | ICD-10-CM | POA: Diagnosis not present

## 2017-05-17 DIAGNOSIS — R2689 Other abnormalities of gait and mobility: Secondary | ICD-10-CM | POA: Diagnosis not present

## 2017-05-17 DIAGNOSIS — Z8673 Personal history of transient ischemic attack (TIA), and cerebral infarction without residual deficits: Secondary | ICD-10-CM | POA: Diagnosis not present

## 2017-05-17 DIAGNOSIS — M25651 Stiffness of right hip, not elsewhere classified: Secondary | ICD-10-CM | POA: Diagnosis not present

## 2017-05-17 DIAGNOSIS — R260 Ataxic gait: Secondary | ICD-10-CM | POA: Diagnosis not present

## 2017-05-17 DIAGNOSIS — M47812 Spondylosis without myelopathy or radiculopathy, cervical region: Secondary | ICD-10-CM | POA: Diagnosis not present

## 2017-05-17 DIAGNOSIS — M25661 Stiffness of right knee, not elsewhere classified: Secondary | ICD-10-CM | POA: Diagnosis not present

## 2017-05-17 DIAGNOSIS — R278 Other lack of coordination: Secondary | ICD-10-CM | POA: Diagnosis not present

## 2017-05-17 DIAGNOSIS — M25662 Stiffness of left knee, not elsewhere classified: Secondary | ICD-10-CM | POA: Diagnosis not present

## 2017-05-17 DIAGNOSIS — M25652 Stiffness of left hip, not elsewhere classified: Secondary | ICD-10-CM | POA: Diagnosis not present

## 2017-05-17 DIAGNOSIS — M6259 Muscle wasting and atrophy, not elsewhere classified, multiple sites: Secondary | ICD-10-CM | POA: Diagnosis not present

## 2017-05-18 DIAGNOSIS — M25652 Stiffness of left hip, not elsewhere classified: Secondary | ICD-10-CM | POA: Diagnosis not present

## 2017-05-18 DIAGNOSIS — Z8673 Personal history of transient ischemic attack (TIA), and cerebral infarction without residual deficits: Secondary | ICD-10-CM | POA: Diagnosis not present

## 2017-05-18 DIAGNOSIS — M25651 Stiffness of right hip, not elsewhere classified: Secondary | ICD-10-CM | POA: Diagnosis not present

## 2017-05-18 DIAGNOSIS — M47812 Spondylosis without myelopathy or radiculopathy, cervical region: Secondary | ICD-10-CM | POA: Diagnosis not present

## 2017-05-18 DIAGNOSIS — G214 Vascular parkinsonism: Secondary | ICD-10-CM | POA: Diagnosis not present

## 2017-05-18 DIAGNOSIS — R278 Other lack of coordination: Secondary | ICD-10-CM | POA: Diagnosis not present

## 2017-05-18 DIAGNOSIS — R260 Ataxic gait: Secondary | ICD-10-CM | POA: Diagnosis not present

## 2017-05-18 DIAGNOSIS — M25662 Stiffness of left knee, not elsewhere classified: Secondary | ICD-10-CM | POA: Diagnosis not present

## 2017-05-18 DIAGNOSIS — M25661 Stiffness of right knee, not elsewhere classified: Secondary | ICD-10-CM | POA: Diagnosis not present

## 2017-05-18 DIAGNOSIS — M6259 Muscle wasting and atrophy, not elsewhere classified, multiple sites: Secondary | ICD-10-CM | POA: Diagnosis not present

## 2017-05-18 DIAGNOSIS — R2689 Other abnormalities of gait and mobility: Secondary | ICD-10-CM | POA: Diagnosis not present

## 2017-05-21 DIAGNOSIS — M1712 Unilateral primary osteoarthritis, left knee: Secondary | ICD-10-CM | POA: Diagnosis not present

## 2017-05-21 DIAGNOSIS — R278 Other lack of coordination: Secondary | ICD-10-CM | POA: Diagnosis not present

## 2017-05-21 DIAGNOSIS — M25561 Pain in right knee: Secondary | ICD-10-CM | POA: Diagnosis not present

## 2017-05-21 DIAGNOSIS — M47812 Spondylosis without myelopathy or radiculopathy, cervical region: Secondary | ICD-10-CM | POA: Diagnosis not present

## 2017-05-21 DIAGNOSIS — G214 Vascular parkinsonism: Secondary | ICD-10-CM | POA: Diagnosis not present

## 2017-05-21 DIAGNOSIS — Z8673 Personal history of transient ischemic attack (TIA), and cerebral infarction without residual deficits: Secondary | ICD-10-CM | POA: Diagnosis not present

## 2017-05-21 DIAGNOSIS — M25562 Pain in left knee: Secondary | ICD-10-CM | POA: Diagnosis not present

## 2017-05-21 DIAGNOSIS — M6259 Muscle wasting and atrophy, not elsewhere classified, multiple sites: Secondary | ICD-10-CM | POA: Diagnosis not present

## 2017-05-21 DIAGNOSIS — M25661 Stiffness of right knee, not elsewhere classified: Secondary | ICD-10-CM | POA: Diagnosis not present

## 2017-05-21 DIAGNOSIS — R2689 Other abnormalities of gait and mobility: Secondary | ICD-10-CM | POA: Diagnosis not present

## 2017-05-21 DIAGNOSIS — M25651 Stiffness of right hip, not elsewhere classified: Secondary | ICD-10-CM | POA: Diagnosis not present

## 2017-05-21 DIAGNOSIS — R260 Ataxic gait: Secondary | ICD-10-CM | POA: Diagnosis not present

## 2017-05-21 DIAGNOSIS — M25652 Stiffness of left hip, not elsewhere classified: Secondary | ICD-10-CM | POA: Diagnosis not present

## 2017-05-21 DIAGNOSIS — M25662 Stiffness of left knee, not elsewhere classified: Secondary | ICD-10-CM | POA: Diagnosis not present

## 2017-05-21 NOTE — Telephone Encounter (Signed)
Debbie from [facility] called again to inquire about the appeal for the wheelchair.

## 2017-05-22 ENCOUNTER — Encounter: Payer: Self-pay | Admitting: Internal Medicine

## 2017-05-22 DIAGNOSIS — M47812 Spondylosis without myelopathy or radiculopathy, cervical region: Secondary | ICD-10-CM | POA: Diagnosis not present

## 2017-05-22 DIAGNOSIS — M25662 Stiffness of left knee, not elsewhere classified: Secondary | ICD-10-CM | POA: Diagnosis not present

## 2017-05-22 DIAGNOSIS — M25651 Stiffness of right hip, not elsewhere classified: Secondary | ICD-10-CM | POA: Diagnosis not present

## 2017-05-22 DIAGNOSIS — R278 Other lack of coordination: Secondary | ICD-10-CM | POA: Diagnosis not present

## 2017-05-22 DIAGNOSIS — R260 Ataxic gait: Secondary | ICD-10-CM | POA: Diagnosis not present

## 2017-05-22 DIAGNOSIS — M25661 Stiffness of right knee, not elsewhere classified: Secondary | ICD-10-CM | POA: Diagnosis not present

## 2017-05-22 DIAGNOSIS — Z8673 Personal history of transient ischemic attack (TIA), and cerebral infarction without residual deficits: Secondary | ICD-10-CM | POA: Diagnosis not present

## 2017-05-22 DIAGNOSIS — M25652 Stiffness of left hip, not elsewhere classified: Secondary | ICD-10-CM | POA: Diagnosis not present

## 2017-05-22 DIAGNOSIS — R2689 Other abnormalities of gait and mobility: Secondary | ICD-10-CM | POA: Diagnosis not present

## 2017-05-22 DIAGNOSIS — G214 Vascular parkinsonism: Secondary | ICD-10-CM | POA: Diagnosis not present

## 2017-05-22 DIAGNOSIS — M6259 Muscle wasting and atrophy, not elsewhere classified, multiple sites: Secondary | ICD-10-CM | POA: Diagnosis not present

## 2017-05-22 NOTE — Telephone Encounter (Signed)
I attempted to calll the number Debbie gave me and it was some nurse's phone not the [facility] appeals line.  I found a number in the denial but that didn't seem to go anywhere.  Finally I did a letter appealing the denial.  It will be sent.  Hopefully, it will be successful for him.

## 2017-05-23 DIAGNOSIS — M25651 Stiffness of right hip, not elsewhere classified: Secondary | ICD-10-CM | POA: Diagnosis not present

## 2017-05-23 DIAGNOSIS — M25652 Stiffness of left hip, not elsewhere classified: Secondary | ICD-10-CM | POA: Diagnosis not present

## 2017-05-23 DIAGNOSIS — M25662 Stiffness of left knee, not elsewhere classified: Secondary | ICD-10-CM | POA: Diagnosis not present

## 2017-05-23 DIAGNOSIS — R260 Ataxic gait: Secondary | ICD-10-CM | POA: Diagnosis not present

## 2017-05-23 DIAGNOSIS — R2689 Other abnormalities of gait and mobility: Secondary | ICD-10-CM | POA: Diagnosis not present

## 2017-05-23 DIAGNOSIS — G214 Vascular parkinsonism: Secondary | ICD-10-CM | POA: Diagnosis not present

## 2017-05-23 DIAGNOSIS — Z8673 Personal history of transient ischemic attack (TIA), and cerebral infarction without residual deficits: Secondary | ICD-10-CM | POA: Diagnosis not present

## 2017-05-23 DIAGNOSIS — M6259 Muscle wasting and atrophy, not elsewhere classified, multiple sites: Secondary | ICD-10-CM | POA: Diagnosis not present

## 2017-05-23 DIAGNOSIS — M47812 Spondylosis without myelopathy or radiculopathy, cervical region: Secondary | ICD-10-CM | POA: Diagnosis not present

## 2017-05-23 DIAGNOSIS — M25661 Stiffness of right knee, not elsewhere classified: Secondary | ICD-10-CM | POA: Diagnosis not present

## 2017-05-23 DIAGNOSIS — R278 Other lack of coordination: Secondary | ICD-10-CM | POA: Diagnosis not present

## 2017-05-24 DIAGNOSIS — G214 Vascular parkinsonism: Secondary | ICD-10-CM | POA: Diagnosis not present

## 2017-05-24 DIAGNOSIS — M6259 Muscle wasting and atrophy, not elsewhere classified, multiple sites: Secondary | ICD-10-CM | POA: Diagnosis not present

## 2017-05-24 DIAGNOSIS — R2689 Other abnormalities of gait and mobility: Secondary | ICD-10-CM | POA: Diagnosis not present

## 2017-05-24 DIAGNOSIS — M25662 Stiffness of left knee, not elsewhere classified: Secondary | ICD-10-CM | POA: Diagnosis not present

## 2017-05-24 DIAGNOSIS — R278 Other lack of coordination: Secondary | ICD-10-CM | POA: Diagnosis not present

## 2017-05-24 DIAGNOSIS — M25661 Stiffness of right knee, not elsewhere classified: Secondary | ICD-10-CM | POA: Diagnosis not present

## 2017-05-24 DIAGNOSIS — R260 Ataxic gait: Secondary | ICD-10-CM | POA: Diagnosis not present

## 2017-05-24 DIAGNOSIS — M25651 Stiffness of right hip, not elsewhere classified: Secondary | ICD-10-CM | POA: Diagnosis not present

## 2017-05-24 DIAGNOSIS — M25652 Stiffness of left hip, not elsewhere classified: Secondary | ICD-10-CM | POA: Diagnosis not present

## 2017-05-24 DIAGNOSIS — M47812 Spondylosis without myelopathy or radiculopathy, cervical region: Secondary | ICD-10-CM | POA: Diagnosis not present

## 2017-05-24 DIAGNOSIS — Z8673 Personal history of transient ischemic attack (TIA), and cerebral infarction without residual deficits: Secondary | ICD-10-CM | POA: Diagnosis not present

## 2017-05-25 ENCOUNTER — Ambulatory Visit (INDEPENDENT_AMBULATORY_CARE_PROVIDER_SITE_OTHER): Payer: Medicare Other | Admitting: Neurology

## 2017-05-25 ENCOUNTER — Encounter: Payer: Self-pay | Admitting: Neurology

## 2017-05-25 VITALS — BP 168/98 | HR 53 | Ht 69.0 in

## 2017-05-25 DIAGNOSIS — G214 Vascular parkinsonism: Secondary | ICD-10-CM | POA: Diagnosis not present

## 2017-05-25 DIAGNOSIS — R269 Unspecified abnormalities of gait and mobility: Secondary | ICD-10-CM

## 2017-05-25 NOTE — Progress Notes (Signed)
Reason for visit: Gait disorder  Kenneth Stark is an 83 y.o. male  History of present illness:  Kenneth Stark is an 83 year old right-handed white male with a history of a severe gait disorder. The patient fell in November 2017, he struck his head and has small subdural hematoma and intraventricular blood. The patient has had a significant alteration in his ability to ambulate since that time that has gradually worsened over time. The patient had a CT scan of the brain that was done in May 2018, a severe problem with ventricular enlargement was not noted. The patient had been placed on Sinemet as he had features of parkinsonism, he had a shuffling gait and freezing episodes. The dose of the Sinemet was increased to 25/100 mg tablets, taking 2 tablets 3 times daily. If anything, his ability to ambulate has worsened. The patient has a very definite tendency to lean backwards when he stands, he cannot get up on his own. He is able to walk short distances with a walker with some assistance. He returns for an urgent evaluation. His memory and urinary incontinence has also worsened over time.   Past Medical History:  Diagnosis Date  . Abnormality of gait   . Arthritis   . Brain bleed ([place]) 09/01/2016  . Cervical spondylosis   . Cervical spondylosis   . Degenerative joint disease (DJD) of lumbar spine   . Depression   . Diplopia   . Dyslipidemia   . Essential tremor   . Foul smelling urine   . Frequent falls   . GERD (gastroesophageal reflux disease)   . Glycosuria   . Hearing difficulty    hearing aids  . Hyperlipidemia   . Hypertension   . Hyperthyroidism   . Memory loss   . Osteoarthritis   . Radiculopathy, lumbar region   . 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 Small vessel disease   . TIA (transient ischemic attack)     Past Surgical History:  Procedure Laterality Date  . APPENDECTOMY     done as a child  . GALLBLADDER SURGERY  2008  . KNEE  ARTHROSCOPY Left    Dr. Hart Robinsons 2002  . knee injections Right    Dr. Adriana Mccallum  . TONSILLECTOMY     done as a child    Family History  Problem Relation Age of Onset  . Stroke Mother   . Dementia Mother   . Stroke Father   . Heart disease Father   . Diabetes Father   . Dementia Brother   . Renal Disease Brother   . Diabetes Brother   . Renal Disease Daughter     Social history:  reports that he has never smoked. He has never used smokeless tobacco. He reports that he drinks alcohol. He reports that he does not use drugs.   No Known Allergies  Medications:  Prior to Admission medications   Medication Sig Start Date End Date Taking? Authorizing Provider  Ascorbic Acid (VITAMIN C) 1000 MG tablet Take 1,000 mg by mouth 3 (three) times daily.    Yes [provider]  aspirin 81 MG tablet Take 81 mg by mouth daily.   Yes [provider]  Calcium Carb-Cholecalciferol 600-800 MG-UNIT CHEW Chew by mouth.   Yes [provider]  carbidopa-levodopa (SINEMET IR) 25-100 MG tablet Take 2 tablets by mouth 3 (three) times daily. 03/29/17  Yes [name], MD  clopidogrel (PLAVIX) 75 MG tablet Take 75 mg by mouth daily.   Yes [provider]  Co-Enzyme Q-10 100 MG CAPS Take 100 mg by mouth daily.   Yes [provider]  escitalopram (LEXAPRO) 10 MG tablet Take 10 mg by mouth daily.   Yes [provider]  Lutein-Zeaxanthin 25-5 MG CAPS Take 1 capsule by mouth daily.   Yes [provider]  Memantine HCl-Donepezil HCl (NAMZARIC) 28-10 MG CP24 Take 1 capsule by mouth every evening.   Yes [provider]  mirtazapine (REMERON) 30 MG tablet Take 30 mg by mouth at bedtime.   Yes [provider]  Misc Natural Products (GLUCOSAMINE CHOND COMPLEX/MSM PO) Take 1 tablet by mouth 3 (three) times daily.    Yes [provider]  Multiple Vitamin (MULTI-VITAMINS) TABS Take by mouth.   Yes [provider]    nebivolol (BYSTOLIC) 5 MG tablet Take 1 tablet (5 mg total) by mouth daily. 04/04/17  Yes Reed, Tiffany L, DO  Omega-3 Fatty Acids (OMEGA 3 PO) Take 1 capsule by mouth 2 (two) times daily.    Yes [provider]  potassium chloride (K-DUR) 10 MEQ tablet Take 10 mEq by mouth daily. 11/05/13  Yes [provider]  saw palmetto 500 MG capsule Take 500 mg by mouth daily.   Yes [provider]  telmisartan (MICARDIS) 80 MG tablet Take 80 mg by mouth daily. 11/05/13  Yes [provider]  traMADol (ULTRAM) 50 MG tablet Take by mouth every 8 (eight) hours as needed.   Yes [provider]  triamcinolone (NASACORT) 55 MCG/ACT AERO nasal inhaler Place 2 sprays into the nose at bedtime.   Yes [provider]    ROS:  Out of a complete 14 system review of symptoms, the patient complains only of the following symptoms, and all other reviewed systems are negative.  Hearing loss cold intolerance Incontinence of the bladder Daytime sleepiness Walking difficulty Speech difficulty Depression  Blood pressure (!) 168/98, pulse (!) 53, height 5\' 9"  (1.753 m), SpO2 96 %.  Physical Exam  General: The patient is alert and cooperative at the time of the examination.  Skin: No significant peripheral edema is noted.   Neurologic Exam  Mental status: The patient is alert and oriented x 3 at the time of the examination.    Cranial nerves: Facial symmetry is present. Speech is normal, no aphasia or dysarthria is noted. Extraocular movements are full. Visual fields are full.  Motor: The patient has good strength in all 4 extremities.  Sensory examination: Soft touch sensation is symmetric on the face, arms, and legs.  Coordination: The patient has good finger-nose-finger and heel-to-shin bilaterally. there is some mild apraxia with the use of the lower extremities.  Gait and station: The patient is unable to ambulate, with maximal assistance, he can be  brought to a standing position, but he stands with knee flexion and leaning backwards dramatically.  Reflexes: Deep tendon reflexes are symmetric.   CT head 03/09/17:  IMPRESSION: CT scan of the head without contrast shows the following: 1. Small left convexity subdural fluid collection, unchanged in thickness compared to the 10/20/2016 CT scan. 2. Moderate cortical atrophy and chronic microvascular ischemic change, unchanged when compared to the previous CT scan.  * CT scan images were reviewed online. I agree with the written report.    Assessment/Plan:  1. Severe gait disorder  2. History of head trauma, intraventricular blood and subdural hematoma  The patient has  had a gradual worsening of his ability to ambulate. While on Sinemet his ability to ambulate has worsened. Upon review of multiple CT head scan evaluations, there may have been a very mild increase in ventricular size from November 2017 until now. The patient will be evaluated for possible normal pressure hydrocephalus. He will undergo a large volume spinal tap and follow-up in the office today after to reevaluate his walking. The patient has become almost nonambulatory.     Jill Alexanders MD 05/25/2017 12:24 PM  [facility] [address] [place], [place] [address]  Phone [phone] Fax [phone]

## 2017-05-25 NOTE — Patient Instructions (Signed)
   We will get a spinal tap to see if this improves the ability to walk.

## 2017-05-28 ENCOUNTER — Non-Acute Institutional Stay: Payer: Medicare Other | Admitting: Adult Health

## 2017-05-28 ENCOUNTER — Telehealth: Payer: Self-pay | Admitting: Neurology

## 2017-05-28 ENCOUNTER — Telehealth: Payer: Self-pay | Admitting: Diagnostic Neuroimaging

## 2017-05-28 DIAGNOSIS — M25651 Stiffness of right hip, not elsewhere classified: Secondary | ICD-10-CM | POA: Diagnosis not present

## 2017-05-28 DIAGNOSIS — R5381 Other malaise: Secondary | ICD-10-CM

## 2017-05-28 DIAGNOSIS — M47812 Spondylosis without myelopathy or radiculopathy, cervical region: Secondary | ICD-10-CM | POA: Diagnosis not present

## 2017-05-28 DIAGNOSIS — M25652 Stiffness of left hip, not elsewhere classified: Secondary | ICD-10-CM | POA: Diagnosis not present

## 2017-05-28 DIAGNOSIS — I10 Essential (primary) hypertension: Secondary | ICD-10-CM

## 2017-05-28 DIAGNOSIS — M25661 Stiffness of right knee, not elsewhere classified: Secondary | ICD-10-CM | POA: Diagnosis not present

## 2017-05-28 DIAGNOSIS — Z8673 Personal history of transient ischemic attack (TIA), and cerebral infarction without residual deficits: Secondary | ICD-10-CM | POA: Diagnosis not present

## 2017-05-28 DIAGNOSIS — G214 Vascular parkinsonism: Secondary | ICD-10-CM | POA: Diagnosis not present

## 2017-05-28 DIAGNOSIS — R260 Ataxic gait: Secondary | ICD-10-CM | POA: Diagnosis not present

## 2017-05-28 DIAGNOSIS — E876 Hypokalemia: Secondary | ICD-10-CM | POA: Diagnosis not present

## 2017-05-28 DIAGNOSIS — R635 Abnormal weight gain: Secondary | ICD-10-CM | POA: Diagnosis not present

## 2017-05-28 DIAGNOSIS — R269 Unspecified abnormalities of gait and mobility: Secondary | ICD-10-CM

## 2017-05-28 DIAGNOSIS — R278 Other lack of coordination: Secondary | ICD-10-CM | POA: Diagnosis not present

## 2017-05-28 DIAGNOSIS — M25662 Stiffness of left knee, not elsewhere classified: Secondary | ICD-10-CM | POA: Diagnosis not present

## 2017-05-28 DIAGNOSIS — R2689 Other abnormalities of gait and mobility: Secondary | ICD-10-CM | POA: Diagnosis not present

## 2017-05-28 DIAGNOSIS — M6259 Muscle wasting and atrophy, not elsewhere classified, multiple sites: Secondary | ICD-10-CM | POA: Diagnosis not present

## 2017-05-28 NOTE — Telephone Encounter (Signed)
Patient's wife called in to inform us of elevated BP for husband. This was addressed by NP this AM from [facility] senior care. No further specific question from patient's wife. Advised her to follow instructions from [facility] senior care providers.  Penni Bombard, MD 04/16/3016, 2:09 PM Certified in Neurology, Neurophysiology and Neuroimaging  [facility] [address] [place], [place] [address] [phone]

## 2017-05-28 NOTE — Progress Notes (Signed)
Location:  Occupational psychologist of Service:  ALF (13) Provider:   Cindi Carbon, ANP [place] [phone]   Kenneth Curry, DO  Patient Care Team: Kenneth Curry, DO as PCP - General (Geriatric Medicine) Danella Sensing, MD as Consulting Physician (Dermatology) Sharyne Peach, MD as Consulting Physician (Ophthalmology) Irine Seal, MD as Attending Physician (Urology) [name], MD as Consulting Physician (Neurology)  Extended Emergency Contact Information Primary Emergency Contact: Kenneth Stark Address: [address]          [place] [address] Kenneth Stark of [place] Phone: [phone] Relation: Spouse Secondary Emergency Contact: Kenneth Stark [place] Mobile Phone: [phone] Relation: Daughter  Code Status:  DNR Goals of care: Advanced Directive information Advanced Directives 05/28/2017  Does Patient Have a Medical Advance Directive? Yes  Type of Advance Directive Out of facility DNR (pink MOST or yellow form);[place];Living will  Does patient want to make changes to medical advance directive? -  Copy of [place] in Chart? Yes  Would patient like information on creating a medical advance directive? -  Pre-existing out of facility DNR order (yellow form or pink MOST form) Yellow form placed in chart (order not valid for inpatient use)     Chief Complaint  Patient presents with  . Acute Visit    htn    HPI:  Pt is a 83 y.o. male seen today for an acute visit for a BP of 242/98 this am. After his morning BP meds BP was 160/84.  He was on HCTZ and having issues with frequency and hctz was discontinued. Originally his BP was doing well per the records with only slight elevation. Kenneth Stark fell in November 2017, he struck his head and has small subdural hematoma and intraventricular bleed.  He has had a significant decline in function and cognition over  the past few weeks. His gait has worsened and he now uses a stand up lift. He has increased confusion and word finding issues with slow response as well. He is on sinemet for vascular parkinsonism. He was seen by Dr. Jannifer [facility] with neurology and an LP ordered due to concerns for normal pressure hydrocephalus which is scheduled in Sept.  He was very rigid this am per the nurse but once he received his medication and his BP came down he returned to baseline with no focal deficits.The nsg supervisor has mentioned that he may need to move to rehab if he continues to need increased assistance with mobility.  Kenneth Stark has gained 30 lbs.  He is apparently eating better and is also on lexapro and Remeron. The Remeron was decreased from 30 to 15 two weeks ago due to the weight gain. He has also had some knee but this subsided per the staff.    03/08/17 IMPRESSION:  CT scan of the head without contrast shows the following: 1.    Small left convexity subdural fluid collection, unchanged in thickness compared to the 10/20/2016 CT scan. 2.    Moderate cortical atrophy and chronic microvascular ischemic change, unchanged when compared to the previous CT scan.  Past Medical History:  Diagnosis Date  . Abnormality of gait   . Arthritis   . Brain bleed ([place]) 09/01/2016  . Cervical spondylosis   . Cervical spondylosis   . Degenerative joint disease (DJD) of lumbar spine   . Depression   . Diplopia   . Dyslipidemia   . Essential tremor   .  Foul smelling urine   . Frequent falls   . GERD (gastroesophageal reflux disease)   . Glycosuria   . Hearing difficulty    hearing aids  . Hyperlipidemia   . Hypertension   . Hyperthyroidism   . Memory loss   . Osteoarthritis   . Radiculopathy, lumbar region   . 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 Small vessel disease   . TIA (transient ischemic attack)    Past Surgical History:  Procedure Laterality Date  . APPENDECTOMY     done  as a child  . GALLBLADDER SURGERY  2008  . KNEE ARTHROSCOPY Left    Dr. Hart Robinsons 2002  . knee injections Right    Dr. Adriana Mccallum  . TONSILLECTOMY     done as a child    No Known Allergies  Outpatient Encounter Prescriptions as of 05/28/2017  Medication Sig  . mirtazapine (REMERON) 15 MG tablet Take 15 mg by mouth at bedtime.  . Ascorbic Acid (VITAMIN C) 1000 MG tablet Take 1,000 mg by mouth 3 (three) times daily.   Marland Kitchen aspirin 81 MG tablet Take 81 mg by mouth daily.  . Calcium Carb-Cholecalciferol 600-800 MG-UNIT CHEW Chew by mouth.  . carbidopa-levodopa (SINEMET IR) 25-100 MG tablet Take 2 tablets by mouth 3 (three) times daily.  . clopidogrel (PLAVIX) 75 MG tablet Take 75 mg by mouth daily.  Marland Kitchen Co-Enzyme Q-10 100 MG CAPS Take 100 mg by mouth daily.  Marland Kitchen escitalopram (LEXAPRO) 10 MG tablet Take 10 mg by mouth daily.  . Lutein-Zeaxanthin 25-5 MG CAPS Take 1 capsule by mouth daily.  . Memantine HCl-Donepezil HCl (NAMZARIC) 28-10 MG CP24 Take 1 capsule by mouth every evening.  . Misc Natural Products (GLUCOSAMINE CHOND COMPLEX/MSM PO) Take 1 tablet by mouth 3 (three) times daily.   . Multiple Vitamin (MULTI-VITAMINS) TABS Take by mouth.  . nebivolol (BYSTOLIC) 5 MG tablet Take 1 tablet (5 mg total) by mouth daily.  . Omega-3 Fatty Acids (OMEGA 3 PO) Take 1 capsule by mouth 2 (two) times daily.   . potassium chloride (K-DUR) 10 MEQ tablet Take 10 mEq by mouth daily.  . saw palmetto 500 MG capsule Take 500 mg by mouth daily.  Marland Kitchen telmisartan (MICARDIS) 80 MG tablet Take 80 mg by mouth daily.  . traMADol (ULTRAM) 50 MG tablet Take by mouth every 8 (eight) hours as needed.  . triamcinolone (NASACORT) 55 MCG/ACT AERO nasal inhaler Place 2 sprays into the nose at bedtime.  . [DISCONTINUED] mirtazapine (REMERON) 30 MG tablet Take 30 mg by mouth at bedtime.   No facility-administered encounter medications on file as of 05/28/2017.     Review of Systems  Constitutional: Positive for activity  change, appetite change and unexpected weight change. Negative for chills, diaphoresis, fatigue and fever.  HENT: Negative for congestion and trouble swallowing.   Eyes: Negative for photophobia, redness and visual disturbance.  Respiratory: Negative for cough, shortness of breath and wheezing.   Cardiovascular: Negative for chest pain, palpitations and leg swelling.  Gastrointestinal: Negative for abdominal distention, abdominal pain, constipation and diarrhea.  Genitourinary: Negative for difficulty urinating, dysuria, flank pain and frequency.  Musculoskeletal: Positive for arthralgias and gait problem. Negative for back pain and joint swelling.  Neurological: Positive for tremors, speech difficulty and weakness. Negative for dizziness, seizures, syncope, facial asymmetry, light-headedness, numbness and headaches.  Psychiatric/Behavioral: Positive for confusion. Negative for agitation, behavioral problems, decreased concentration and dysphoric mood.  Immunization History  Administered Date(s) Administered  . Influenza, High Dose Seasonal PF 07/31/2016  . Influenza-Unspecified 08/01/2010, 07/30/2012, 10/16/2012, 10/16/2013  . Pneumococcal Polysaccharide-23 02/23/2004, 02/21/2013  . Pneumococcal-Unspecified 03/17/2016  . Td 12/02/2008  . Tdap 09/01/2016  . Zoster 03/28/2006   Pertinent  Health Maintenance Due  Topic Date Due  . PNA vac Low Risk Adult (2 of 2 - PCV13) 03/17/2017  . INFLUENZA VACCINE  05/16/2017   Fall Risk  04/04/2017 02/26/2017  Falls in the past year? No No   Functional Status Survey:    Vitals:   05/28/17 1120  BP: (!) 160/84  Pulse: (!) 52  Resp: 20  Temp: 98.4 F (36.9 C)  SpO2: 98%  Weight: 186 lb 12.8 oz (84.7 kg)   Body mass index is 27.59 kg/m. Physical Exam  Constitutional: No distress.  HENT:  Head: Normocephalic and atraumatic.  Nose: Nose normal.  Mouth/Throat: Oropharynx is clear and moist. No oropharyngeal exudate.  Eyes: Pupils are  equal, round, and reactive to light. Conjunctivae and EOM are normal. Right eye exhibits no discharge. Left eye exhibits no discharge.  Neck: Normal range of motion. Neck supple. No JVD present. No tracheal deviation present. No thyromegaly present.  Cardiovascular: Normal rate and regular rhythm.   No murmur heard. Pulmonary/Chest: Effort normal and breath sounds normal. No respiratory distress. He has no wheezes.  Abdominal: Soft. Bowel sounds are normal. He exhibits no distension. There is no tenderness.  Musculoskeletal: He exhibits no edema or tenderness.  Generally weak  Lymphadenopathy:    He has no cervical adenopathy.  Neurological: He is alert. No cranial nerve deficit. Coordination normal.  Oriented to self and location. Able to f/c.  Slow to respond with word finding issues. Confused about the details of his care.   Skin: Skin is warm and dry. He is not diaphoretic.  Psychiatric: He has a normal mood and affect.    Labs reviewed:  Recent Labs  09/03/16 0235  09/07/16 1402 09/07/16 1844  09/09/16 0548 09/12/16 0212 09/15/16 0555 09/22/16 12/14/16 05/11/17  NA 138  < >  --   --   < > 137 136 135 137 137 140  K 2.8*  < >  --   --   < > 3.9 3.4* 3.6 4.3 3.9 3.9  CL 103  < >  --   --   < > 104 104 100*  --   --   --   CO2 26  < >  --   --   < > 24 25 24   --   --   --   GLUCOSE 195*  < >  --   --   < > 191* 129* 138*  --   --   --   BUN 19  < >  --   --   < > 21* 17 15 20  26* 20  CREATININE 1.01  < >  --   --   < > 0.97 0.71 0.87 0.8 1.1 0.9  CALCIUM 9.2  < >  --   --   < > 8.8* 8.6* 9.1  --   --   --   MG 1.9  --  1.8  --   --   --   --   --   --   --   --   PHOS  --   --   --  3.2  --   --   --   --   --   --   --   < > =  values in this interval not displayed.  Recent Labs  09/07/16 1300 09/08/16 0603  AST 42* 40  ALT 53 53  ALKPHOS 51 44  BILITOT 0.5 0.5  PROT 5.7* 5.3*  ALBUMIN 2.9* 2.6*    Recent Labs  09/01/16 1201  09/07/16 1300 09/08/16 0603  09/09/16 0548 09/12/16 0212 10/18/16 12/14/16  WBC 17.4*  < > 12.1* 11.2* 10.9* 10.5 6.4 8.8  NEUTROABS 15.2*  --  8.9*  --   --  6.8  --   --   HGB 12.0*  < > 9.4* 9.1* 9.9* 10.4* 11.3* 12.2*  HCT 35.8*  < > 27.4* 27.0* 29.1* 31.0* 34* 36*  MCV 92.0  < > 90.7 91.2 90.4 91.7  --   --   PLT 220  < > 255 235 297 359  --  303  < > = values in this interval not displayed. Lab Results  Component Value Date   TSH 5.18 05/11/2017   Lab Results  Component Value Date   HGBA1C 6.8 (H) 09/07/2016   Lab Results  Component Value Date   CHOL 127 05/11/2017   HDL 50 05/11/2017   LDLCALC 58 05/11/2017   TRIG 98 05/11/2017    Significant Diagnostic Results in last 30 days:  No results found.  Assessment/Plan  1. Essential hypertension Improved from this am but still above goal Norvasc 5 mg qd BP qshift for 5 days Would send to the ER if his BP gets over 200 again and doesn't response to medications  2. Abnormality of gait Progressive decline in cognition, gait, and speech No signs of acute CVA, ? NPH   3. Hypokalemia No longer on hctz so may not need this D/C Kdur repeat BMP in 1 week  4. Physical deconditioning Due to rapidly progressing neuro condition Await LP results Continue to work with therapy and do exercises in the room as able Can stay in AL enhanced for now but if he needs a hoyer may need to move to rehab  5. Weight gain TSH normal Consider further Remeron taper  Family/ staff Communication: discussed with staff  Labs/tests ordered:  BMP

## 2017-05-28 NOTE — Telephone Encounter (Signed)
[facility] [phone] called the clinic reg scheduling spinal tap. Please call to discuss where order will be sent.

## 2017-05-29 ENCOUNTER — Encounter: Payer: Self-pay | Admitting: Internal Medicine

## 2017-05-29 ENCOUNTER — Non-Acute Institutional Stay (SKILLED_NURSING_FACILITY): Payer: Medicare Other | Admitting: Internal Medicine

## 2017-05-29 ENCOUNTER — Telehealth: Payer: Self-pay | Admitting: Neurology

## 2017-05-29 DIAGNOSIS — I10 Essential (primary) hypertension: Secondary | ICD-10-CM

## 2017-05-29 DIAGNOSIS — R278 Other lack of coordination: Secondary | ICD-10-CM | POA: Diagnosis not present

## 2017-05-29 DIAGNOSIS — R351 Nocturia: Secondary | ICD-10-CM | POA: Diagnosis not present

## 2017-05-29 DIAGNOSIS — F0280 Dementia in other diseases classified elsewhere without behavioral disturbance: Secondary | ICD-10-CM | POA: Diagnosis not present

## 2017-05-29 DIAGNOSIS — G309 Alzheimer's disease, unspecified: Secondary | ICD-10-CM | POA: Diagnosis not present

## 2017-05-29 DIAGNOSIS — M6259 Muscle wasting and atrophy, not elsewhere classified, multiple sites: Secondary | ICD-10-CM | POA: Diagnosis not present

## 2017-05-29 DIAGNOSIS — F0150 Vascular dementia without behavioral disturbance: Secondary | ICD-10-CM

## 2017-05-29 DIAGNOSIS — G214 Vascular parkinsonism: Secondary | ICD-10-CM

## 2017-05-29 DIAGNOSIS — R635 Abnormal weight gain: Secondary | ICD-10-CM | POA: Diagnosis not present

## 2017-05-29 DIAGNOSIS — R2689 Other abnormalities of gait and mobility: Secondary | ICD-10-CM | POA: Diagnosis not present

## 2017-05-29 DIAGNOSIS — T50905A Adverse effect of unspecified drugs, medicaments and biological substances, initial encounter: Secondary | ICD-10-CM | POA: Diagnosis not present

## 2017-05-29 DIAGNOSIS — F324 Major depressive disorder, single episode, in partial remission: Secondary | ICD-10-CM | POA: Diagnosis not present

## 2017-05-29 DIAGNOSIS — R260 Ataxic gait: Secondary | ICD-10-CM | POA: Diagnosis not present

## 2017-05-29 DIAGNOSIS — N401 Enlarged prostate with lower urinary tract symptoms: Secondary | ICD-10-CM | POA: Diagnosis not present

## 2017-05-29 MED ORDER — AMLODIPINE BESYLATE 10 MG PO TABS: 10.0000 mg | ORAL_TABLET | Freq: Every day | ORAL | 3 refills | Status: DC

## 2017-05-29 NOTE — Telephone Encounter (Signed)
Patient's wife is calling saying patient cannot get a spinal tap scheduled until 07-02-17 but she thinks he needs to have this done sooner.

## 2017-05-29 NOTE — Progress Notes (Signed)
Patient ID: Kenneth Stark, male   DOB: 06/01/1925, 83 y.o.   MRN: [id number]  Provider:  Rexene Edison. Mariea Clonts, D.O., C.M.D. Location:  [place] Room Number: [place] of Service:  SNF (31)  PCP: [name], DO Patient Care Team: [name], DO as PCP - General (Geriatric Medicine) Danella Sensing, MD as Consulting Physician (Dermatology) Sharyne Peach, MD as Consulting Physician (Ophthalmology) Irine Seal, MD as Attending Physician (Urology) [name], MD as Consulting Physician (Neurology)  Extended Emergency Contact Information Primary Emergency Contact: Meaders,[name] Address: [address]          [place] [address] [name] of [place] Phone: [phone] Relation: Spouse Secondary Emergency Contact: [place] of [place] Mobile Phone: [phone] Relation: Daughter  Code Status: DNR Goals of Care: Advanced Directive information Advanced Directives 05/29/2017  Does Patient Have a Medical Advance Directive? Yes  Type of Advance Directive Out of facility DNR (pink MOST or yellow form);Kenneth Stark;Living will  Does patient want to make changes to medical advance directive? -  Copy of [place] in Chart? Yes  Would patient like information on creating a medical advance directive? -  Pre-existing out of facility DNR order (yellow form or pink MOST form) Yellow form placed in chart (order not valid for inpatient use)   Chief Complaint  Patient presents with  . New Admit To SNF    Rehab admission    HPI: Patient is a 83 y.o. male with h/o Parkinsonism, falls, subarachnoid and subdural hematomas, and vascular dementia seen today for admission to [facility] rehab from AL enhanced unit due to declining functional status and elevated blood pressure.  Mr. Kenneth Stark had been living in enhanced AL where he was receiving assistance with medications, dressing, bathing.   He was having urinary incontinence and using a condom cath at hs with depends in the daytime.  He's been on sinemet therapy for the parkinsonian symptoms that have included shuffling gait, masked facies, bradykinesia, rigidity.  Recently on 6/20, he was seen in clinic for a power mobility evaluation and after much ado, his power wheelchair was approved just over this past weekend.  He's been declining in function and is no longer able to use the stand-up lift indicating a need for skilled care.  Also, he is being followed by Dr. Jannifer Franklin who believes he may have normal pressure hydrocephalus contributing to his abnormal gait, incontinence and dementia.  He has recommended a large volume LP to see if his gait improves suggesting this is the case.  Ventricles were noted to be enlarged on his CT head 04/08/17 per neuro.  He continued to have the small left convexity subdural fluid collection which was unchanged from January and moderate cortical atrophy with chronic small vessel ischemic change also unchanged.  NP saw him yesterday due to his BP being 242/98 in the morning BEFORE his meds.  When I'd seen him in June, readings were running max 258N systolic.  Yesterday, he was more rigid than usual, but still improved post-am sinemet.  He was started on amlodipine 5mg  daily with bp q shift for 5 days.  HR this am is 53 with his low dose bystolic 5mg .  There were plans to send him to the ED if his bp would not come down below 200 with medications here.     Past Medical History:  Diagnosis Date  . Abnormality of gait   . Arthritis   .  Brain bleed ([place]) 09/01/2016  . Cervical spondylosis   . Cervical spondylosis   . Degenerative joint disease (DJD) of lumbar spine   . Depression   . Diplopia   . Dyslipidemia   . Essential tremor   . Foul smelling urine   . Frequent falls   . GERD (gastroesophageal reflux disease)   . Glycosuria   . Hearing difficulty    hearing aids  . Hyperlipidemia   . Hypertension     . Hyperthyroidism   . Memory loss   . Osteoarthritis   . Radiculopathy, lumbar region   . 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 Small vessel disease   . TIA (transient ischemic attack)    Past Surgical History:  Procedure Laterality Date  . APPENDECTOMY     done as a child  . GALLBLADDER SURGERY  2008  . KNEE ARTHROSCOPY Left    Dr. Hart Robinsons 2002  . knee injections Right    Dr. Adriana Mccallum  . TONSILLECTOMY     done as a child    reports that he has never smoked. He has never used smokeless tobacco. He reports that he drinks alcohol. He reports that he does not use drugs. Social History   Social History  . Marital status: Married    Spouse name: [name]  . Number of children: 4  . Years of education: N/A   Occupational History  . Retired     [employer]   Social History Main Topics  . Smoking status: Never Smoker  . Smokeless tobacco: Never Used  . Alcohol use Yes     Comment: Wife occass/rare  . Drug use: No  . Sexual activity: Not on file   Other Topics Concern  . Not on file   Social History Narrative   Tobacco use, amount per day now: NEVER   Past tobacco use, amount per day: never    How many years did you use tobacco:   Alcohol use (drinks per week): OCCASIONAL GLASS OF WINE   Diet:HIGH FIBER, LOW FAT   Do you drink/eat things with caffeine:COFFEE IN THE MORNING   Marital status:    MARRIED                             What year were you married? 1951   Do you live in a house, apartment, assisted living, condo, trailer, etc.? Assisted living    Is it one or more stories?   How many persons live in your home?    Do you have pets in your home?( please list) NO   Highest level of education completed: [place] classes    Current or past profession: [employer].   Do you exercise?    Walking    Type and how often? 3 times a week    Do you have a living  will? YES   Do you have a DNR form?  YES                                 If not, do you want to discuss one?   Do you have signed POA/HPOA forms?  YES  If so, please bring to you appointment      Has difficulty bathing or dressing self   Has difficulty preparing food    Has difficulty managing medications   Has difficulty managing finances   Does not have any difficulty affording medications        Functional Status Survey:  dependent except feeding and no longer able to use stand-up lift  Family History  Problem Relation Age of Onset  . Stroke Mother   . Dementia Mother   . Stroke Father   . Heart disease Father   . Diabetes Father   . Dementia Brother   . Renal Disease Brother   . Diabetes Brother   . Renal Disease Daughter     Health Maintenance  Topic Date Due  . PNA vac Low Risk Adult (2 of 2 - PCV13) 03/17/2017  . INFLUENZA VACCINE  05/16/2017  . TETANUS/TDAP  09/01/2026    No Known Allergies  Outpatient Encounter Prescriptions as of 05/29/2017  Medication Sig  . amLODipine (NORVASC) 5 MG tablet Take 5 mg by mouth daily.  Marland Kitchen aspirin 81 MG tablet Take 81 mg by mouth daily.  . Calcium Carb-Cholecalciferol 600-800 MG-UNIT CHEW Chew by mouth.  . carbidopa-levodopa (SINEMET IR) 25-100 MG tablet Take 2 tablets by mouth 3 (three) times daily.  . clopidogrel (PLAVIX) 75 MG tablet Take 75 mg by mouth daily.  Marland Kitchen Co-Enzyme Q-10 100 MG CAPS Take 100 mg by mouth daily.  Marland Kitchen escitalopram (LEXAPRO) 10 MG tablet Take 10 mg by mouth daily.  . Lutein-Zeaxanthin 25-5 MG CAPS Take 1 capsule by mouth daily.  . Memantine HCl-Donepezil HCl (NAMZARIC) 28-10 MG CP24 Take 1 capsule by mouth every evening.  . Menthol, Topical Analgesic, (BIOFREEZE) 4 % GEL Apply 1 application topically 2 (two) times daily as needed.  . mirtazapine (REMERON) 15 MG tablet Take 15 mg by mouth at bedtime.  . Misc Natural Products (GLUCOSAMINE CHOND COMPLEX/MSM PO) Take 1 tablet by mouth 3  (three) times daily.   . Multiple Vitamin (MULTI-VITAMINS) TABS Take by mouth.  . nebivolol (BYSTOLIC) 5 MG tablet Take 1 tablet (5 mg total) by mouth daily.  . Omega-3 Fatty Acids (OMEGA 3 PO) Take 1 capsule by mouth 2 (two) times daily.   . saw palmetto 500 MG capsule Take 500 mg by mouth daily.  Marland Kitchen telmisartan (MICARDIS) 80 MG tablet Take 80 mg by mouth daily.  . traMADol (ULTRAM) 50 MG tablet Take by mouth every 8 (eight) hours as needed.  . triamcinolone (NASACORT) 55 MCG/ACT AERO nasal inhaler Place 2 sprays into the nose at bedtime.  . vitamin C (ASCORBIC ACID) 500 MG tablet Take 500 mg by mouth daily.  . [DISCONTINUED] Ascorbic Acid (VITAMIN C) 1000 MG tablet Take 1,000 mg by mouth 3 (three) times daily.   . [DISCONTINUED] potassium chloride (K-DUR) 10 MEQ tablet Take 10 mEq by mouth daily.   No facility-administered encounter medications on file as of 05/29/2017.     Review of Systems  Constitutional: Positive for activity change and fatigue. Negative for appetite change, chills and fever.  HENT: Positive for hearing loss. Negative for congestion.   Eyes: Negative for visual disturbance.       Glasses  Respiratory: Negative for chest tightness and shortness of breath.   Cardiovascular: Positive for leg swelling. Negative for chest pain and palpitations.       Compression hose effective  Gastrointestinal: Positive for constipation. Negative for abdominal distention and abdominal pain.  Genitourinary:  Positive for frequency and urgency. Negative for dysuria.       Condom cath at hs  Musculoskeletal: Positive for gait problem.  Skin: Negative for color change.  Neurological: Positive for tremors, speech difficulty and weakness. Negative for dizziness, seizures, light-headedness and headaches.  Hematological: Bruises/bleeds easily.  Psychiatric/Behavioral: Positive for confusion. Negative for agitation, behavioral problems, hallucinations, sleep disturbance and suicidal ideas.     Vitals:   05/29/17 0958  BP: (!) 191/80  Pulse: (!) 53  Resp: 20  Temp: (!) 97.3 F (36.3 C)  TempSrc: Oral  SpO2: 96%  Weight: 186 lb (84.4 kg)   Body mass index is 27.47 kg/m. Physical Exam  Constitutional: He appears well-developed. No distress.  HENT:  Head: Normocephalic and atraumatic.  Right Ear: External ear normal.  Nose: Nose normal.  Mouth/Throat: Oropharynx is clear and moist.  Left ear scar and deformity from fall with injury  Eyes: Pupils are equal, round, and reactive to light. Conjunctivae are normal.  Neck: Normal range of motion. Neck supple. No JVD present.  Cardiovascular: Normal rate and regular rhythm.   Pulmonary/Chest: Effort normal and breath sounds normal. He has no wheezes. He has no rales.  Abdominal: Soft. Bowel sounds are normal. He exhibits no distension. There is no tenderness.  Musculoskeletal: He exhibits edema. He exhibits no tenderness.  Lymphadenopathy:    He has no cervical adenopathy.  Neurological: He is alert.  Short term memory loss, oriented to person, place, not time, resting tremor not seen, does have some cogwheeling rigidity of UEs, unable to use stand-up lift  Skin: Skin is warm and dry.  Psychiatric: He has a normal mood and affect.    Labs reviewed: Basic Metabolic Panel:  Recent Labs  09/03/16 0235  09/07/16 1402 09/07/16 1844  09/09/16 0548 09/12/16 0212 09/15/16 0555 09/22/16 12/14/16 05/11/17  NA 138  < >  --   --   < > 137 136 135 137 137 140  K 2.8*  < >  --   --   < > 3.9 3.4* 3.6 4.3 3.9 3.9  CL 103  < >  --   --   < > 104 104 100*  --   --   --   CO2 26  < >  --   --   < > 24 25 24   --   --   --   GLUCOSE 195*  < >  --   --   < > 191* 129* 138*  --   --   --   BUN 19  < >  --   --   < > 21* 17 15 20  26* 20  CREATININE 1.01  < >  --   --   < > 0.97 0.71 0.87 0.8 1.1 0.9  CALCIUM 9.2  < >  --   --   < > 8.8* 8.6* 9.1  --   --   --   MG 1.9  --  1.8  --   --   --   --   --   --   --   --   PHOS  --    --   --  3.2  --   --   --   --   --   --   --   < > = values in this interval not displayed. Liver Function Tests:  Recent Labs  09/07/16 1300 09/08/16 0603  AST 42* 40  ALT 53  53  ALKPHOS 51 44  BILITOT 0.5 0.5  PROT 5.7* 5.3*  ALBUMIN 2.9* 2.6*   No results for input(s): LIPASE, AMYLASE in the last 8760 hours.  Recent Labs  09/09/16 1223  AMMONIA 19   CBC:  Recent Labs  09/01/16 1201  09/07/16 1300 09/08/16 0603 09/09/16 0548 09/12/16 0212 10/18/16 12/14/16  WBC 17.4*  < > 12.1* 11.2* 10.9* 10.5 6.4 8.8  NEUTROABS 15.2*  --  8.9*  --   --  6.8  --   --   HGB 12.0*  < > 9.4* 9.1* 9.9* 10.4* 11.3* 12.2*  HCT 35.8*  < > 27.4* 27.0* 29.1* 31.0* 34* 36*  MCV 92.0  < > 90.7 91.2 90.4 91.7  --   --   PLT 220  < > 255 235 297 359  --  303  < > = values in this interval not displayed. Cardiac Enzymes: No results for input(s): CKTOTAL, CKMB, CKMBINDEX, TROPONINI in the last 8760 hours. BNP: Invalid input(s): POCBNP Lab Results  Component Value Date   HGBA1C 6.8 (H) 09/07/2016   Lab Results  Component Value Date   TSH 5.18 05/11/2017   Lab Results  Component Value Date   [id number] 182 12/08/2016   No results found for: FOLATE No results found for: IRON, TIBC, FERRITIN  Imaging and Procedures obtained prior to SNF admission: Ct Head Wo Contrast  Result Date: 03/09/2017  [place], [place],  [phone] NEUROIMAGING REPORT STUDY DATE: 03/08/2017 PATIENT NAME: JAMAREA SELNER DOB: [birth year] MRN: [id number] EXAM: CT of the head without contrast ORDERING CLINICIAN: [name] M.D. CLINICAL HISTORY: 83 year old man with history of subdural hematoma and gait disturbance COMPARISON FILMS: CT scan 10/20/2016 TECHNIQUE: CT scan of the head was obtained utilizing 5 mm axial slices from the skull base to the vertex. Reconstructed coronal and sagittal images were also reviewed CONTRAST: None IMAGING SITE:  [facility],  [place] FINDINGS: There is a chronic left convexity subdural hematoma with a maximal thickness of 3 mm, best seen on the coronal reconstructed images. This is essentially unchanged when compared to the one 03/04/2017 CT scan. No other abnormal fluid collections are noted.  No evidence of mass effect or midline shift.  The third and lateral ventricles are large, proportion to the extent of moderate auricle atrophy. Some periventricular and deep white matter hypodense changes are noted consistent with chronic microvascular ischemic change, unchanged when compared to the previous CT scan. The orbits and their contents, paranasal sinuses and calvarium are unremarkable.     CT scan of the head without contrast shows the following: 1.    Small left convexity subdural fluid collection, unchanged in thickness compared to the 10/20/2016 CT scan. 2.    Moderate cortical atrophy and chronic microvascular ischemic change, unchanged when compared to the previous CT scan. INTERPRETING PHYSICIAN: Richard A. Felecia Shelling, MD, PhD Certified in  Neuroimaging by [place] [employer]    Assessment/Plan 1. Vascular parkinsonism ([place]) -ongoing, is on sinemet 2 tablets tid -has had decreased ability to transfer and worsening tremors so now in rehab for PT, OT and to get bp under control   2. Mixed Alzheimer's and vascular dementia -progressing, neurology suspects NPH as ventricles slightly enlarged on brain imaging, pt also still has fluid on brain from location of subdural he had with his fall--there are plans for a large volume LP and potentially a shunt placement -has been felt up to  this point to have vascular parkinsonism and vascular dementia possible some AD also as he had been on memory loss meds even before the fall and subdural  -MMSE 18/30 on 01/23/17  3. Benign prostatic hyperplasia with nocturia -cont his current saw palmetto at his and his wife's request, using condom cath  at hs due to nocturia interfering with his sleep  4. Essential hypertension -Was suddenly very high w/o clear cause -Increase amlodipine to 10mg , cont compression hose for edema which could worsen with this change (currently well controlled venous insufficiency), cont bystolic, telmisartan  5. Depression, major, single episode, in partial remission ([place]) -d/c remeron as weight up and maybe too much serotonin worsening tremor and bp (serotonin syndrome--no fever or tachycardia though), cont lexapro  6. Weight gain due to medication -Decrease remeron to 7.5mg  x 2 d, then d/c due to serotonin excess and has gained significant weight, already on lexapro for depression  Family/ staff Communication:  Discussed with SNF nurse, nurse manager  Labs/tests ordered:  No new  Chistian Kasler L. Behr Cislo, D.O. [facility] [address], [place] [address] Cell Phone (Mon-Fri 8am-5pm):  [phone] On Call:  [phone] & follow prompts after 5pm & weekends Office Phone:  [phone] Office Fax:  [phone]

## 2017-05-29 NOTE — Telephone Encounter (Signed)
Relayed Message to [place] at [facility] to call nurse line and schedule spinal tap [phone]

## 2017-05-29 NOTE — Telephone Encounter (Signed)
Called and spoke with wife. Relayed message below. She verbalized understanding and appreciation for call. She will call back with any further questions or concerns.

## 2017-05-29 NOTE — Telephone Encounter (Signed)
Called and spoke with [place] at [facility] imaging. She states she was just trying to reach [facility] to schedule LP. She states they can get him in as early as next week. She will contact [facility] to get him scheduled. She is not sure what wife was referring to abut not being scheduled until 07-02-17.

## 2017-05-31 DIAGNOSIS — Z8673 Personal history of transient ischemic attack (TIA), and cerebral infarction without residual deficits: Secondary | ICD-10-CM | POA: Diagnosis not present

## 2017-05-31 DIAGNOSIS — G214 Vascular parkinsonism: Secondary | ICD-10-CM | POA: Diagnosis not present

## 2017-05-31 DIAGNOSIS — R262 Difficulty in walking, not elsewhere classified: Secondary | ICD-10-CM | POA: Diagnosis not present

## 2017-05-31 DIAGNOSIS — M25652 Stiffness of left hip, not elsewhere classified: Secondary | ICD-10-CM | POA: Diagnosis not present

## 2017-05-31 DIAGNOSIS — M6389 Disorders of muscle in diseases classified elsewhere, multiple sites: Secondary | ICD-10-CM | POA: Diagnosis not present

## 2017-05-31 DIAGNOSIS — M47816 Spondylosis without myelopathy or radiculopathy, lumbar region: Secondary | ICD-10-CM | POA: Diagnosis not present

## 2017-05-31 DIAGNOSIS — G912 (Idiopathic) normal pressure hydrocephalus: Secondary | ICD-10-CM | POA: Diagnosis not present

## 2017-05-31 DIAGNOSIS — R2681 Unsteadiness on feet: Secondary | ICD-10-CM | POA: Diagnosis not present

## 2017-05-31 DIAGNOSIS — M25651 Stiffness of right hip, not elsewhere classified: Secondary | ICD-10-CM | POA: Diagnosis not present

## 2017-05-31 DIAGNOSIS — R2689 Other abnormalities of gait and mobility: Secondary | ICD-10-CM | POA: Diagnosis not present

## 2017-05-31 DIAGNOSIS — R278 Other lack of coordination: Secondary | ICD-10-CM | POA: Diagnosis not present

## 2017-06-01 DIAGNOSIS — R2681 Unsteadiness on feet: Secondary | ICD-10-CM | POA: Diagnosis not present

## 2017-06-01 DIAGNOSIS — R262 Difficulty in walking, not elsewhere classified: Secondary | ICD-10-CM | POA: Diagnosis not present

## 2017-06-01 DIAGNOSIS — M25652 Stiffness of left hip, not elsewhere classified: Secondary | ICD-10-CM | POA: Diagnosis not present

## 2017-06-01 DIAGNOSIS — G214 Vascular parkinsonism: Secondary | ICD-10-CM | POA: Diagnosis not present

## 2017-06-01 DIAGNOSIS — R278 Other lack of coordination: Secondary | ICD-10-CM | POA: Diagnosis not present

## 2017-06-01 DIAGNOSIS — R2689 Other abnormalities of gait and mobility: Secondary | ICD-10-CM | POA: Diagnosis not present

## 2017-06-01 DIAGNOSIS — M47816 Spondylosis without myelopathy or radiculopathy, lumbar region: Secondary | ICD-10-CM | POA: Diagnosis not present

## 2017-06-01 DIAGNOSIS — Z8673 Personal history of transient ischemic attack (TIA), and cerebral infarction without residual deficits: Secondary | ICD-10-CM | POA: Diagnosis not present

## 2017-06-01 DIAGNOSIS — M6389 Disorders of muscle in diseases classified elsewhere, multiple sites: Secondary | ICD-10-CM | POA: Diagnosis not present

## 2017-06-01 DIAGNOSIS — G912 (Idiopathic) normal pressure hydrocephalus: Secondary | ICD-10-CM | POA: Diagnosis not present

## 2017-06-01 DIAGNOSIS — M25651 Stiffness of right hip, not elsewhere classified: Secondary | ICD-10-CM | POA: Diagnosis not present

## 2017-06-04 DIAGNOSIS — I10 Essential (primary) hypertension: Secondary | ICD-10-CM | POA: Diagnosis not present

## 2017-06-04 DIAGNOSIS — M6389 Disorders of muscle in diseases classified elsewhere, multiple sites: Secondary | ICD-10-CM | POA: Diagnosis not present

## 2017-06-04 DIAGNOSIS — M47816 Spondylosis without myelopathy or radiculopathy, lumbar region: Secondary | ICD-10-CM | POA: Diagnosis not present

## 2017-06-04 DIAGNOSIS — R262 Difficulty in walking, not elsewhere classified: Secondary | ICD-10-CM | POA: Diagnosis not present

## 2017-06-04 DIAGNOSIS — M25652 Stiffness of left hip, not elsewhere classified: Secondary | ICD-10-CM | POA: Diagnosis not present

## 2017-06-04 DIAGNOSIS — R2681 Unsteadiness on feet: Secondary | ICD-10-CM | POA: Diagnosis not present

## 2017-06-04 DIAGNOSIS — D649 Anemia, unspecified: Secondary | ICD-10-CM | POA: Diagnosis not present

## 2017-06-04 DIAGNOSIS — R278 Other lack of coordination: Secondary | ICD-10-CM | POA: Diagnosis not present

## 2017-06-04 DIAGNOSIS — G214 Vascular parkinsonism: Secondary | ICD-10-CM | POA: Diagnosis not present

## 2017-06-04 DIAGNOSIS — Z8673 Personal history of transient ischemic attack (TIA), and cerebral infarction without residual deficits: Secondary | ICD-10-CM | POA: Diagnosis not present

## 2017-06-04 DIAGNOSIS — M25651 Stiffness of right hip, not elsewhere classified: Secondary | ICD-10-CM | POA: Diagnosis not present

## 2017-06-04 DIAGNOSIS — G912 (Idiopathic) normal pressure hydrocephalus: Secondary | ICD-10-CM | POA: Diagnosis not present

## 2017-06-04 DIAGNOSIS — R2689 Other abnormalities of gait and mobility: Secondary | ICD-10-CM | POA: Diagnosis not present

## 2017-06-04 LAB — BASIC METABOLIC PANEL
BUN: 17 (ref 4–21)
CREATININE: 0.9 (ref 0.6–1.3)
GLUCOSE: 154
POTASSIUM: 4.1 (ref 3.4–5.3)
Sodium: 140 (ref 137–147)

## 2017-06-04 NOTE — Telephone Encounter (Signed)
Rob (Physical Therapist) with [facility] is calling in reference to patient having LP 06/07/17 in AM.  Rob would like to know if he is able to get orders for Physical Therapist and gait stability Thursday afternoon after spinal tap that morning.  Rob would also like clarification if patient is to stay in bed 24 hours after LP is completed.  Please call [phone]

## 2017-06-04 NOTE — Telephone Encounter (Signed)
Called wife back. Scheduled appt for 06/08/17 at 12pm, check in 1130am. She verbalized understanding.

## 2017-06-04 NOTE — Telephone Encounter (Signed)
I called the physical therapist, Rob, patient will be having a lumbar puncture, he will do an assessment before and afterwards to see if there is any improvement in the walking. I will send them a written order to do this.  What the patient's head down for about 2 hours after the procedure, he is not keeping his head down all day long, want to get him up and to move about after the procedure.  I will fax the order to [phone].

## 2017-06-04 NOTE — Telephone Encounter (Signed)
Pt's wife called the clinic to make an appt for the patient. She said at the last OV Dr Jannifer Franklin wanted to see the pt the next day after the spinal tap. Please call to advised at [phone]

## 2017-06-05 DIAGNOSIS — Z8673 Personal history of transient ischemic attack (TIA), and cerebral infarction without residual deficits: Secondary | ICD-10-CM | POA: Diagnosis not present

## 2017-06-05 DIAGNOSIS — M25652 Stiffness of left hip, not elsewhere classified: Secondary | ICD-10-CM | POA: Diagnosis not present

## 2017-06-05 DIAGNOSIS — R262 Difficulty in walking, not elsewhere classified: Secondary | ICD-10-CM | POA: Diagnosis not present

## 2017-06-05 DIAGNOSIS — M6389 Disorders of muscle in diseases classified elsewhere, multiple sites: Secondary | ICD-10-CM | POA: Diagnosis not present

## 2017-06-05 DIAGNOSIS — G214 Vascular parkinsonism: Secondary | ICD-10-CM | POA: Diagnosis not present

## 2017-06-05 DIAGNOSIS — R2681 Unsteadiness on feet: Secondary | ICD-10-CM | POA: Diagnosis not present

## 2017-06-05 DIAGNOSIS — M25651 Stiffness of right hip, not elsewhere classified: Secondary | ICD-10-CM | POA: Diagnosis not present

## 2017-06-05 DIAGNOSIS — R278 Other lack of coordination: Secondary | ICD-10-CM | POA: Diagnosis not present

## 2017-06-05 DIAGNOSIS — R2689 Other abnormalities of gait and mobility: Secondary | ICD-10-CM | POA: Diagnosis not present

## 2017-06-05 DIAGNOSIS — M47816 Spondylosis without myelopathy or radiculopathy, lumbar region: Secondary | ICD-10-CM | POA: Diagnosis not present

## 2017-06-05 DIAGNOSIS — G912 (Idiopathic) normal pressure hydrocephalus: Secondary | ICD-10-CM | POA: Diagnosis not present

## 2017-06-06 DIAGNOSIS — M47816 Spondylosis without myelopathy or radiculopathy, lumbar region: Secondary | ICD-10-CM | POA: Diagnosis not present

## 2017-06-06 DIAGNOSIS — R278 Other lack of coordination: Secondary | ICD-10-CM | POA: Diagnosis not present

## 2017-06-06 DIAGNOSIS — G214 Vascular parkinsonism: Secondary | ICD-10-CM | POA: Diagnosis not present

## 2017-06-06 DIAGNOSIS — M25652 Stiffness of left hip, not elsewhere classified: Secondary | ICD-10-CM | POA: Diagnosis not present

## 2017-06-06 DIAGNOSIS — Z8673 Personal history of transient ischemic attack (TIA), and cerebral infarction without residual deficits: Secondary | ICD-10-CM | POA: Diagnosis not present

## 2017-06-06 DIAGNOSIS — R2689 Other abnormalities of gait and mobility: Secondary | ICD-10-CM | POA: Diagnosis not present

## 2017-06-06 DIAGNOSIS — G912 (Idiopathic) normal pressure hydrocephalus: Secondary | ICD-10-CM | POA: Diagnosis not present

## 2017-06-06 DIAGNOSIS — R262 Difficulty in walking, not elsewhere classified: Secondary | ICD-10-CM | POA: Diagnosis not present

## 2017-06-06 DIAGNOSIS — M6389 Disorders of muscle in diseases classified elsewhere, multiple sites: Secondary | ICD-10-CM | POA: Diagnosis not present

## 2017-06-06 DIAGNOSIS — M25651 Stiffness of right hip, not elsewhere classified: Secondary | ICD-10-CM | POA: Diagnosis not present

## 2017-06-06 DIAGNOSIS — R2681 Unsteadiness on feet: Secondary | ICD-10-CM | POA: Diagnosis not present

## 2017-06-07 ENCOUNTER — Ambulatory Visit
Admission: RE | Admit: 2017-06-07 | Discharge: 2017-06-07 | Disposition: A | Discharge status: Home / Self Care | Payer: Medicare Other | Source: Ambulatory Visit | Attending: Neurology | Admitting: Neurology

## 2017-06-07 ENCOUNTER — Other Ambulatory Visit: Payer: Self-pay | Admitting: Neurology

## 2017-06-07 ENCOUNTER — Telehealth: Payer: Self-pay | Admitting: Neurology

## 2017-06-07 DIAGNOSIS — R278 Other lack of coordination: Secondary | ICD-10-CM | POA: Diagnosis not present

## 2017-06-07 DIAGNOSIS — N433 Hydrocele, unspecified: Secondary | ICD-10-CM | POA: Diagnosis not present

## 2017-06-07 DIAGNOSIS — M25651 Stiffness of right hip, not elsewhere classified: Secondary | ICD-10-CM | POA: Diagnosis not present

## 2017-06-07 DIAGNOSIS — R2689 Other abnormalities of gait and mobility: Secondary | ICD-10-CM | POA: Diagnosis not present

## 2017-06-07 DIAGNOSIS — G214 Vascular parkinsonism: Secondary | ICD-10-CM | POA: Diagnosis not present

## 2017-06-07 DIAGNOSIS — M6389 Disorders of muscle in diseases classified elsewhere, multiple sites: Secondary | ICD-10-CM | POA: Diagnosis not present

## 2017-06-07 DIAGNOSIS — R269 Unspecified abnormalities of gait and mobility: Secondary | ICD-10-CM | POA: Diagnosis not present

## 2017-06-07 DIAGNOSIS — R262 Difficulty in walking, not elsewhere classified: Secondary | ICD-10-CM | POA: Diagnosis not present

## 2017-06-07 DIAGNOSIS — M47816 Spondylosis without myelopathy or radiculopathy, lumbar region: Secondary | ICD-10-CM | POA: Diagnosis not present

## 2017-06-07 DIAGNOSIS — G912 (Idiopathic) normal pressure hydrocephalus: Secondary | ICD-10-CM | POA: Diagnosis not present

## 2017-06-07 DIAGNOSIS — M25652 Stiffness of left hip, not elsewhere classified: Secondary | ICD-10-CM | POA: Diagnosis not present

## 2017-06-07 DIAGNOSIS — Z8673 Personal history of transient ischemic attack (TIA), and cerebral infarction without residual deficits: Secondary | ICD-10-CM | POA: Diagnosis not present

## 2017-06-07 DIAGNOSIS — R2681 Unsteadiness on feet: Secondary | ICD-10-CM | POA: Diagnosis not present

## 2017-06-07 LAB — CSF CELL COUNT WITH DIFFERENTIAL
RBC Count, CSF: 4 cells/uL (ref 0–10)
WBC, CSF: 0 cells/uL (ref 0–5)

## 2017-06-07 LAB — PROTEIN, CSF: TOTAL PROTEIN, CSF: 57 mg/dL (ref 15–60)

## 2017-06-07 LAB — GLUCOSE, CSF: GLUCOSE CSF: 80 mg/dL — AB (ref 43–76)

## 2017-06-07 NOTE — Telephone Encounter (Signed)
FYI! Kenneth Stark with Kenneth Stark called to advice patients STAT CSF labs are available.

## 2017-06-07 NOTE — Discharge Instructions (Signed)
Lumbar Puncture Discharge Instructions ° °1. Go home and rest quietly for the next 24 hours.  It is important to lie flat for the next 24 hours.  Get up only to go to the restroom.  You may lie in the bed or on a couch on your back, your stomach, your left side or your right side.  You may have one pillow under your head.  You may have pillows between your knees while you are on your side or under your knees while you are on your back. ° °2. DO NOT drive today.  Recline the seat as far back as it will go, while still wearing your seat belt, on the way home. ° °3. You may get up to go to the bathroom as needed.  You may sit up for 10 minutes to eat.  You may resume your normal diet and medications unless otherwise indicated.  Drink lots of extra fluids today and tomorrow. ° °4. The incidence of headache, nausea, or vomiting is about 5% (one in 20 patients).  If you develop a headache, lie flat and drink plenty of fluids until the headache goes away.  Caffeinated beverages may be helpful.  If you develop severe nausea and vomiting or a headache that does not go away with flat bed rest, call the physician who sent you here.  ° °5. You may resume normal activities after your 24 hours of bed rest is over; however, do not exert yourself strongly or do any heavy lifting tomorrow. ° °6. Call your physician for a follow-up appointment.  ° °7. If you have any questions  after you arrive home, please call [phone]. ° °Discharge instructions have been explained to the patient.  The patient, or the person responsible for the patient, fully understands these instructions. ° ° °MAY RESUME PLAVIX TODAY. °

## 2017-06-08 ENCOUNTER — Encounter: Payer: Self-pay | Admitting: Neurology

## 2017-06-08 ENCOUNTER — Ambulatory Visit (INDEPENDENT_AMBULATORY_CARE_PROVIDER_SITE_OTHER): Payer: Medicare Other | Admitting: Neurology

## 2017-06-08 VITALS — BP 139/56 | HR 59 | Ht 69.0 in

## 2017-06-08 DIAGNOSIS — M6389 Disorders of muscle in diseases classified elsewhere, multiple sites: Secondary | ICD-10-CM | POA: Diagnosis not present

## 2017-06-08 DIAGNOSIS — R269 Unspecified abnormalities of gait and mobility: Secondary | ICD-10-CM

## 2017-06-08 DIAGNOSIS — R278 Other lack of coordination: Secondary | ICD-10-CM | POA: Diagnosis not present

## 2017-06-08 DIAGNOSIS — M25652 Stiffness of left hip, not elsewhere classified: Secondary | ICD-10-CM | POA: Diagnosis not present

## 2017-06-08 DIAGNOSIS — M47816 Spondylosis without myelopathy or radiculopathy, lumbar region: Secondary | ICD-10-CM | POA: Diagnosis not present

## 2017-06-08 DIAGNOSIS — R2689 Other abnormalities of gait and mobility: Secondary | ICD-10-CM | POA: Diagnosis not present

## 2017-06-08 DIAGNOSIS — Z8673 Personal history of transient ischemic attack (TIA), and cerebral infarction without residual deficits: Secondary | ICD-10-CM | POA: Diagnosis not present

## 2017-06-08 DIAGNOSIS — R262 Difficulty in walking, not elsewhere classified: Secondary | ICD-10-CM | POA: Diagnosis not present

## 2017-06-08 DIAGNOSIS — M25651 Stiffness of right hip, not elsewhere classified: Secondary | ICD-10-CM | POA: Diagnosis not present

## 2017-06-08 DIAGNOSIS — R2681 Unsteadiness on feet: Secondary | ICD-10-CM | POA: Diagnosis not present

## 2017-06-08 DIAGNOSIS — G912 (Idiopathic) normal pressure hydrocephalus: Secondary | ICD-10-CM | POA: Diagnosis not present

## 2017-06-08 DIAGNOSIS — G214 Vascular parkinsonism: Secondary | ICD-10-CM | POA: Diagnosis not present

## 2017-06-08 NOTE — Progress Notes (Signed)
Reason for visit: Gait disorder  Kenneth Stark is an 83 y.o. male  History of present illness:  Kenneth Stark is an 83 year old right-handed white male with a history of a severe gait disorder. The patient has had progressive problems with walking since a fall in 2017. The patient did have some intraventricular blood, there is some question that the lateral ventricles have enlarged slightly. The patient has progressed to the point where he is essentially nonambulatory. He underwent a large-volume lumbar puncture yesterday, a physical therapist evaluated before and after the lumbar puncture. Prior to the lumbar puncture he was transferring from sitting to standing with maximal assistance with a significant posterior lean. The patient was not able to take any steps. Following the lumbar puncture, the patient seemed to improve slightly, he was able to sit at the edge of the bed, he was able to go from sitting to standing with moderate to maximum assistance, he was able to take 3 or 4 steps with a rolling walker. The patient comes back in today for reevaluation. The physical therapist felt that his ability to walk following the spinal tap was still worse than what he had been doing 2 weeks ago or 2 months ago.   Past Medical History:  Diagnosis Date  . Abnormality of gait   . Arthritis   . Brain bleed ([place]) 09/01/2016  . Cervical spondylosis   . Cervical spondylosis   . Degenerative joint disease (DJD) of lumbar spine   . Depression   . Diplopia   . Dyslipidemia   . Essential tremor   . Foul smelling urine   . Frequent falls   . GERD (gastroesophageal reflux disease)   . Glycosuria   . Hearing difficulty    hearing aids  . Hyperlipidemia   . Hypertension   . Hyperthyroidism   . Memory loss   . Osteoarthritis   . Radiculopathy, lumbar region   . 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 Small vessel disease   . TIA (transient ischemic attack)     Past  Surgical History:  Procedure Laterality Date  . APPENDECTOMY     done as a child  . GALLBLADDER SURGERY  2008  . KNEE ARTHROSCOPY Left    Dr. Hart Robinsons 2002  . knee injections Right    Dr. Adriana Mccallum  . TONSILLECTOMY     done as a child    Family History  Problem Relation Age of Onset  . Stroke Mother   . Dementia Mother   . Stroke Father   . Heart disease Father   . Diabetes Father   . Dementia Brother   . Renal Disease Brother   . Diabetes Brother   . Renal Disease Daughter     Social history:  reports that he has never smoked. He has never used smokeless tobacco. He reports that he drinks alcohol. He reports that he does not use drugs.   No Known Allergies  Medications:  Prior to Admission medications   Medication Sig Start Date End Date Taking? Authorizing Provider  amLODipine (NORVASC) 10 MG tablet Take 1 tablet (10 mg total) by mouth daily. 05/29/17  Yes Reed, Tiffany L, DO  aspirin 81 MG tablet Take 81 mg by mouth daily.   Yes [provider]  Calcium Carb-Cholecalciferol 600-800 MG-UNIT CHEW Chew by mouth.   Yes [provider]  carbidopa-levodopa (SINEMET IR) 25-100 MG tablet Take 2 tablets  by mouth 3 (three) times daily. 03/29/17  Yes [name], MD  clopidogrel (PLAVIX) 75 MG tablet Take 75 mg by mouth daily.   Yes [provider]  Co-Enzyme Q-10 100 MG CAPS Take 100 mg by mouth daily.   Yes [provider]  escitalopram (LEXAPRO) 10 MG tablet Take 10 mg by mouth daily.   Yes [provider]  Lutein-Zeaxanthin 25-5 MG CAPS Take 1 capsule by mouth daily.   Yes [provider]  Memantine HCl-Donepezil HCl (NAMZARIC) 28-10 MG CP24 Take 1 capsule by mouth every evening.   Yes [provider]  Menthol, Topical Analgesic, (BIOFREEZE) 4 % GEL Apply 1 application topically 2 (two) times daily as needed.   Yes [provider]  Misc Natural Products (GLUCOSAMINE CHOND COMPLEX/MSM PO) Take 1  tablet by mouth 3 (three) times daily.    Yes [provider]  Multiple Vitamin (MULTI-VITAMINS) TABS Take by mouth.   Yes [provider]  nebivolol (BYSTOLIC) 5 MG tablet Take 1 tablet (5 mg total) by mouth daily. 04/04/17  Yes Reed, Tiffany L, DO  Omega-3 Fatty Acids (OMEGA 3 PO) Take 1 capsule by mouth 2 (two) times daily.    Yes [provider]  saw palmetto 500 MG capsule Take 500 mg by mouth daily.   Yes [provider]  telmisartan (MICARDIS) 80 MG tablet Take 80 mg by mouth daily. 11/05/13  Yes [provider]  traMADol (ULTRAM) 50 MG tablet Take by mouth every 8 (eight) hours as needed.   Yes [provider]  triamcinolone (NASACORT) 55 MCG/ACT AERO nasal inhaler Place 2 sprays into the nose at bedtime.   Yes [provider]  vitamin C (ASCORBIC ACID) 500 MG tablet Take 500 mg by mouth daily.   Yes [provider]    ROS:  Out of a complete 14 system review of symptoms, the patient complains only of the following symptoms, and all other reviewed systems are negative.  Cold intolerance Daytime sleepiness Incontinence of the bladder Speech difficulty Agitation, anxiety  Blood pressure (!) 139/56, pulse (!) 59, height 5\' 9"  (1.753 m).  Physical Exam  General: The patient is alert and cooperative at the time of the examination. The patient is moderately obese.  Skin: No significant peripheral edema is noted.   Neurologic Exam  Mental status: The patient is alert at the time of the examination. The patient is minimally verbal.   Cranial nerves: Facial symmetry is present. Speech is normal, no aphasia or dysarthria is noted. Extraocular movements are full. Visual fields are full.  Motor: The patient has good strength in all 4 extremities.  Sensory examination: Soft touch sensation is symmetric on the face, arms, and legs.  Gait and station: The patient could not be brought to a standing position. He has  a tendency to lean backwards,  Reflexes: Deep tendon reflexes are symmetric.   Assessment/Plan:  1. Severe gait disorder  2. Memory disorder  The patient has had a decline in his ability to ambulate and with his memory over time. The patient may have gained a very modest benefit from the high-volume lumbar puncture, the degree of change was not significant enough that I would recommend having a VP shunt placed in this elderly gentleman. On his last visit 2 weeks ago, I was able to stand the patient, I cannot do this today. If the family does desire to have a second opinion through a neurosurgeon, I will be happy to  set this up. I am concerned that this patient has an untreatable gait disorder. He may have another neurodegenerative process such is PSP causing his difficulty with walking. He will follow-up in November 2018.   Jill Alexanders MD 06/08/2017 12:28 PM  [facility] [address] [place], [place] [address]  Phone [phone] Fax [phone]

## 2017-06-10 DIAGNOSIS — Z8673 Personal history of transient ischemic attack (TIA), and cerebral infarction without residual deficits: Secondary | ICD-10-CM | POA: Diagnosis not present

## 2017-06-10 DIAGNOSIS — R262 Difficulty in walking, not elsewhere classified: Secondary | ICD-10-CM | POA: Diagnosis not present

## 2017-06-10 DIAGNOSIS — G912 (Idiopathic) normal pressure hydrocephalus: Secondary | ICD-10-CM | POA: Diagnosis not present

## 2017-06-10 DIAGNOSIS — M47816 Spondylosis without myelopathy or radiculopathy, lumbar region: Secondary | ICD-10-CM | POA: Diagnosis not present

## 2017-06-10 DIAGNOSIS — G214 Vascular parkinsonism: Secondary | ICD-10-CM | POA: Diagnosis not present

## 2017-06-10 DIAGNOSIS — M25652 Stiffness of left hip, not elsewhere classified: Secondary | ICD-10-CM | POA: Diagnosis not present

## 2017-06-10 DIAGNOSIS — R278 Other lack of coordination: Secondary | ICD-10-CM | POA: Diagnosis not present

## 2017-06-10 DIAGNOSIS — M6389 Disorders of muscle in diseases classified elsewhere, multiple sites: Secondary | ICD-10-CM | POA: Diagnosis not present

## 2017-06-10 DIAGNOSIS — R2689 Other abnormalities of gait and mobility: Secondary | ICD-10-CM | POA: Diagnosis not present

## 2017-06-10 DIAGNOSIS — M25651 Stiffness of right hip, not elsewhere classified: Secondary | ICD-10-CM | POA: Diagnosis not present

## 2017-06-10 DIAGNOSIS — R2681 Unsteadiness on feet: Secondary | ICD-10-CM | POA: Diagnosis not present

## 2017-06-11 DIAGNOSIS — R262 Difficulty in walking, not elsewhere classified: Secondary | ICD-10-CM | POA: Diagnosis not present

## 2017-06-11 DIAGNOSIS — Z8673 Personal history of transient ischemic attack (TIA), and cerebral infarction without residual deficits: Secondary | ICD-10-CM | POA: Diagnosis not present

## 2017-06-11 DIAGNOSIS — G214 Vascular parkinsonism: Secondary | ICD-10-CM | POA: Diagnosis not present

## 2017-06-11 DIAGNOSIS — M6389 Disorders of muscle in diseases classified elsewhere, multiple sites: Secondary | ICD-10-CM | POA: Diagnosis not present

## 2017-06-11 DIAGNOSIS — R2681 Unsteadiness on feet: Secondary | ICD-10-CM | POA: Diagnosis not present

## 2017-06-11 DIAGNOSIS — M25652 Stiffness of left hip, not elsewhere classified: Secondary | ICD-10-CM | POA: Diagnosis not present

## 2017-06-11 DIAGNOSIS — R2689 Other abnormalities of gait and mobility: Secondary | ICD-10-CM | POA: Diagnosis not present

## 2017-06-11 DIAGNOSIS — G912 (Idiopathic) normal pressure hydrocephalus: Secondary | ICD-10-CM | POA: Diagnosis not present

## 2017-06-11 DIAGNOSIS — R278 Other lack of coordination: Secondary | ICD-10-CM | POA: Diagnosis not present

## 2017-06-11 DIAGNOSIS — M25651 Stiffness of right hip, not elsewhere classified: Secondary | ICD-10-CM | POA: Diagnosis not present

## 2017-06-11 DIAGNOSIS — M47816 Spondylosis without myelopathy or radiculopathy, lumbar region: Secondary | ICD-10-CM | POA: Diagnosis not present

## 2017-06-12 DIAGNOSIS — R262 Difficulty in walking, not elsewhere classified: Secondary | ICD-10-CM | POA: Diagnosis not present

## 2017-06-12 DIAGNOSIS — M25651 Stiffness of right hip, not elsewhere classified: Secondary | ICD-10-CM | POA: Diagnosis not present

## 2017-06-12 DIAGNOSIS — G214 Vascular parkinsonism: Secondary | ICD-10-CM | POA: Diagnosis not present

## 2017-06-12 DIAGNOSIS — M6389 Disorders of muscle in diseases classified elsewhere, multiple sites: Secondary | ICD-10-CM | POA: Diagnosis not present

## 2017-06-12 DIAGNOSIS — M47816 Spondylosis without myelopathy or radiculopathy, lumbar region: Secondary | ICD-10-CM | POA: Diagnosis not present

## 2017-06-12 DIAGNOSIS — G912 (Idiopathic) normal pressure hydrocephalus: Secondary | ICD-10-CM | POA: Diagnosis not present

## 2017-06-12 DIAGNOSIS — R2681 Unsteadiness on feet: Secondary | ICD-10-CM | POA: Diagnosis not present

## 2017-06-12 DIAGNOSIS — Z8673 Personal history of transient ischemic attack (TIA), and cerebral infarction without residual deficits: Secondary | ICD-10-CM | POA: Diagnosis not present

## 2017-06-12 DIAGNOSIS — M25652 Stiffness of left hip, not elsewhere classified: Secondary | ICD-10-CM | POA: Diagnosis not present

## 2017-06-12 DIAGNOSIS — R278 Other lack of coordination: Secondary | ICD-10-CM | POA: Diagnosis not present

## 2017-06-12 DIAGNOSIS — R2689 Other abnormalities of gait and mobility: Secondary | ICD-10-CM | POA: Diagnosis not present

## 2017-06-13 ENCOUNTER — Telehealth: Payer: Self-pay | Admitting: Neurology

## 2017-06-13 DIAGNOSIS — R2681 Unsteadiness on feet: Secondary | ICD-10-CM | POA: Diagnosis not present

## 2017-06-13 DIAGNOSIS — R262 Difficulty in walking, not elsewhere classified: Secondary | ICD-10-CM | POA: Diagnosis not present

## 2017-06-13 DIAGNOSIS — Z8673 Personal history of transient ischemic attack (TIA), and cerebral infarction without residual deficits: Secondary | ICD-10-CM | POA: Diagnosis not present

## 2017-06-13 DIAGNOSIS — G912 (Idiopathic) normal pressure hydrocephalus: Secondary | ICD-10-CM | POA: Diagnosis not present

## 2017-06-13 DIAGNOSIS — M25652 Stiffness of left hip, not elsewhere classified: Secondary | ICD-10-CM | POA: Diagnosis not present

## 2017-06-13 DIAGNOSIS — G214 Vascular parkinsonism: Secondary | ICD-10-CM | POA: Diagnosis not present

## 2017-06-13 DIAGNOSIS — R2689 Other abnormalities of gait and mobility: Secondary | ICD-10-CM | POA: Diagnosis not present

## 2017-06-13 DIAGNOSIS — M47816 Spondylosis without myelopathy or radiculopathy, lumbar region: Secondary | ICD-10-CM | POA: Diagnosis not present

## 2017-06-13 DIAGNOSIS — M25651 Stiffness of right hip, not elsewhere classified: Secondary | ICD-10-CM | POA: Diagnosis not present

## 2017-06-13 DIAGNOSIS — R269 Unspecified abnormalities of gait and mobility: Secondary | ICD-10-CM

## 2017-06-13 DIAGNOSIS — M6389 Disorders of muscle in diseases classified elsewhere, multiple sites: Secondary | ICD-10-CM | POA: Diagnosis not present

## 2017-06-13 DIAGNOSIS — R278 Other lack of coordination: Secondary | ICD-10-CM | POA: Diagnosis not present

## 2017-06-13 NOTE — Telephone Encounter (Signed)
Pt wife calling, he is unable to walk he is a [employer].  He is still taking the medication (carbidopa-levodopa (SINEMET IR) 25-100 MG tablet) for walking.  Pt wife would like to know how these cases progress and what the diagnosis is called.  Please call

## 2017-06-13 NOTE — Telephone Encounter (Signed)
Please call patient's wife back. I am not quite sure how to address this question or answer this. There is no easy answer to her question. It looks like in reviewing his chart that he has had a progressive gait disorder and there may be multiple players including advancing age, frailty, prior fall, brain atrophy and previous concern for excessive fluid pressure on the brain but not enough telltale results from a large volume spinal tap recently to warrant a spinal fluid shunt. There may be a component of parkinsonism as well from what I can see in the notes.  We can also await further recommendations and explanation from Dr. Jannifer Franklin when he is back but if you could call her in the interim to advise her of the above it may help.

## 2017-06-13 NOTE — Telephone Encounter (Signed)
Called and spoke with Kenneth Stark, wife. Relayed Dr Gu[place]upe Sabin message. She wanted to know if pt could stop sinemet since they noticed no benefit. I recommended patient stay on medication until they can talk with Dr Jannifer Franklin. He will return to office next week. She verbalized understanding and agreeable to wait until Dr Jannifer Franklin back in the office.

## 2017-06-14 NOTE — Telephone Encounter (Signed)
I called the wife. The patient clearly has a rapidly progressive gait disorder, the question would be whether he has some component of normal pressure hydrocephalus, he has not responded to Sinemet, this medication may need to be discontinued in the future.  I have recommended a second opinion through Dr. Hillery Hunter at [facility]. We will get this referral set up.

## 2017-06-14 NOTE — Addendum Note (Signed)
Addended by: [name] on: 06/14/2017 12:42 PM   Modules accepted: Orders

## 2017-06-15 DIAGNOSIS — M47816 Spondylosis without myelopathy or radiculopathy, lumbar region: Secondary | ICD-10-CM | POA: Diagnosis not present

## 2017-06-15 DIAGNOSIS — R2689 Other abnormalities of gait and mobility: Secondary | ICD-10-CM | POA: Diagnosis not present

## 2017-06-15 DIAGNOSIS — M25651 Stiffness of right hip, not elsewhere classified: Secondary | ICD-10-CM | POA: Diagnosis not present

## 2017-06-15 DIAGNOSIS — R262 Difficulty in walking, not elsewhere classified: Secondary | ICD-10-CM | POA: Diagnosis not present

## 2017-06-15 DIAGNOSIS — M6389 Disorders of muscle in diseases classified elsewhere, multiple sites: Secondary | ICD-10-CM | POA: Diagnosis not present

## 2017-06-15 DIAGNOSIS — R278 Other lack of coordination: Secondary | ICD-10-CM | POA: Diagnosis not present

## 2017-06-15 DIAGNOSIS — M25652 Stiffness of left hip, not elsewhere classified: Secondary | ICD-10-CM | POA: Diagnosis not present

## 2017-06-15 DIAGNOSIS — G912 (Idiopathic) normal pressure hydrocephalus: Secondary | ICD-10-CM | POA: Diagnosis not present

## 2017-06-15 DIAGNOSIS — G214 Vascular parkinsonism: Secondary | ICD-10-CM | POA: Diagnosis not present

## 2017-06-15 DIAGNOSIS — Z8673 Personal history of transient ischemic attack (TIA), and cerebral infarction without residual deficits: Secondary | ICD-10-CM | POA: Diagnosis not present

## 2017-06-15 DIAGNOSIS — R2681 Unsteadiness on feet: Secondary | ICD-10-CM | POA: Diagnosis not present

## 2017-06-15 NOTE — Telephone Encounter (Signed)
Patients wife called office requesting orders be faxed to [facility] stating patient is to taper off Sinemet.  Please fax to [phone] ATTN: Candida Peeling.

## 2017-06-19 DIAGNOSIS — R2681 Unsteadiness on feet: Secondary | ICD-10-CM | POA: Diagnosis not present

## 2017-06-19 DIAGNOSIS — R278 Other lack of coordination: Secondary | ICD-10-CM | POA: Diagnosis not present

## 2017-06-19 DIAGNOSIS — R2689 Other abnormalities of gait and mobility: Secondary | ICD-10-CM | POA: Diagnosis not present

## 2017-06-19 DIAGNOSIS — R262 Difficulty in walking, not elsewhere classified: Secondary | ICD-10-CM | POA: Diagnosis not present

## 2017-06-19 DIAGNOSIS — M6389 Disorders of muscle in diseases classified elsewhere, multiple sites: Secondary | ICD-10-CM | POA: Diagnosis not present

## 2017-06-19 DIAGNOSIS — G912 (Idiopathic) normal pressure hydrocephalus: Secondary | ICD-10-CM | POA: Diagnosis not present

## 2017-06-19 DIAGNOSIS — M47816 Spondylosis without myelopathy or radiculopathy, lumbar region: Secondary | ICD-10-CM | POA: Diagnosis not present

## 2017-06-19 DIAGNOSIS — M25652 Stiffness of left hip, not elsewhere classified: Secondary | ICD-10-CM | POA: Diagnosis not present

## 2017-06-19 DIAGNOSIS — M25651 Stiffness of right hip, not elsewhere classified: Secondary | ICD-10-CM | POA: Diagnosis not present

## 2017-06-19 DIAGNOSIS — G214 Vascular parkinsonism: Secondary | ICD-10-CM | POA: Diagnosis not present

## 2017-06-19 DIAGNOSIS — Z8673 Personal history of transient ischemic attack (TIA), and cerebral infarction without residual deficits: Secondary | ICD-10-CM | POA: Diagnosis not present

## 2017-06-19 NOTE — Telephone Encounter (Signed)
Orders for a taper off of Sinemet have been written and sent, he is to go to one of the 25/100 mg Sinemet tablets 3 times daily for 3 weeks, then take one half tablet 3 times daily for 3 weeks, then stop the medication.

## 2017-06-19 NOTE — Telephone Encounter (Signed)
Faxed orders to [facility]. Fax: [phone]. Received confirmation.

## 2017-06-20 DIAGNOSIS — R2689 Other abnormalities of gait and mobility: Secondary | ICD-10-CM | POA: Diagnosis not present

## 2017-06-20 DIAGNOSIS — M25652 Stiffness of left hip, not elsewhere classified: Secondary | ICD-10-CM | POA: Diagnosis not present

## 2017-06-20 DIAGNOSIS — G912 (Idiopathic) normal pressure hydrocephalus: Secondary | ICD-10-CM | POA: Diagnosis not present

## 2017-06-20 DIAGNOSIS — M25651 Stiffness of right hip, not elsewhere classified: Secondary | ICD-10-CM | POA: Diagnosis not present

## 2017-06-20 DIAGNOSIS — Z8673 Personal history of transient ischemic attack (TIA), and cerebral infarction without residual deficits: Secondary | ICD-10-CM | POA: Diagnosis not present

## 2017-06-20 DIAGNOSIS — M6389 Disorders of muscle in diseases classified elsewhere, multiple sites: Secondary | ICD-10-CM | POA: Diagnosis not present

## 2017-06-20 DIAGNOSIS — M47816 Spondylosis without myelopathy or radiculopathy, lumbar region: Secondary | ICD-10-CM | POA: Diagnosis not present

## 2017-06-20 DIAGNOSIS — R262 Difficulty in walking, not elsewhere classified: Secondary | ICD-10-CM | POA: Diagnosis not present

## 2017-06-20 DIAGNOSIS — G214 Vascular parkinsonism: Secondary | ICD-10-CM | POA: Diagnosis not present

## 2017-06-20 DIAGNOSIS — R2681 Unsteadiness on feet: Secondary | ICD-10-CM | POA: Diagnosis not present

## 2017-06-20 DIAGNOSIS — R278 Other lack of coordination: Secondary | ICD-10-CM | POA: Diagnosis not present

## 2017-06-21 DIAGNOSIS — M25652 Stiffness of left hip, not elsewhere classified: Secondary | ICD-10-CM | POA: Diagnosis not present

## 2017-06-21 DIAGNOSIS — M47816 Spondylosis without myelopathy or radiculopathy, lumbar region: Secondary | ICD-10-CM | POA: Diagnosis not present

## 2017-06-21 DIAGNOSIS — R2689 Other abnormalities of gait and mobility: Secondary | ICD-10-CM | POA: Diagnosis not present

## 2017-06-21 DIAGNOSIS — G912 (Idiopathic) normal pressure hydrocephalus: Secondary | ICD-10-CM | POA: Diagnosis not present

## 2017-06-21 DIAGNOSIS — R262 Difficulty in walking, not elsewhere classified: Secondary | ICD-10-CM | POA: Diagnosis not present

## 2017-06-21 DIAGNOSIS — R2681 Unsteadiness on feet: Secondary | ICD-10-CM | POA: Diagnosis not present

## 2017-06-21 DIAGNOSIS — Z8673 Personal history of transient ischemic attack (TIA), and cerebral infarction without residual deficits: Secondary | ICD-10-CM | POA: Diagnosis not present

## 2017-06-21 DIAGNOSIS — M6389 Disorders of muscle in diseases classified elsewhere, multiple sites: Secondary | ICD-10-CM | POA: Diagnosis not present

## 2017-06-21 DIAGNOSIS — R278 Other lack of coordination: Secondary | ICD-10-CM | POA: Diagnosis not present

## 2017-06-21 DIAGNOSIS — M25651 Stiffness of right hip, not elsewhere classified: Secondary | ICD-10-CM | POA: Diagnosis not present

## 2017-06-21 DIAGNOSIS — G214 Vascular parkinsonism: Secondary | ICD-10-CM | POA: Diagnosis not present

## 2017-06-29 ENCOUNTER — Ambulatory Visit: Payer: Medicare Other | Admitting: Neurology

## 2017-06-29 DIAGNOSIS — E039 Hypothyroidism, unspecified: Secondary | ICD-10-CM | POA: Diagnosis not present

## 2017-06-29 DIAGNOSIS — Z79899 Other long term (current) drug therapy: Secondary | ICD-10-CM | POA: Diagnosis not present

## 2017-07-02 ENCOUNTER — Encounter: Payer: Self-pay | Admitting: Adult Health

## 2017-07-02 ENCOUNTER — Non-Acute Institutional Stay (SKILLED_NURSING_FACILITY): Payer: Medicare Other | Admitting: Adult Health

## 2017-07-02 DIAGNOSIS — F0280 Dementia in other diseases classified elsewhere without behavioral disturbance: Secondary | ICD-10-CM

## 2017-07-02 DIAGNOSIS — G214 Vascular parkinsonism: Secondary | ICD-10-CM

## 2017-07-02 DIAGNOSIS — I10 Essential (primary) hypertension: Secondary | ICD-10-CM | POA: Diagnosis not present

## 2017-07-02 DIAGNOSIS — R635 Abnormal weight gain: Secondary | ICD-10-CM

## 2017-07-02 DIAGNOSIS — G309 Alzheimer's disease, unspecified: Secondary | ICD-10-CM

## 2017-07-02 DIAGNOSIS — R269 Unspecified abnormalities of gait and mobility: Secondary | ICD-10-CM

## 2017-07-02 DIAGNOSIS — I679 Cerebrovascular disease, unspecified: Secondary | ICD-10-CM | POA: Diagnosis not present

## 2017-07-02 DIAGNOSIS — F0150 Vascular dementia without behavioral disturbance: Secondary | ICD-10-CM | POA: Diagnosis not present

## 2017-07-02 NOTE — Progress Notes (Signed)
Location:  Occupational psychologist of Service:  SNF (31) Provider:   Cindi Carbon, ANP [place] [phone]   [name], DO  Patient Care Team: [name], DO as PCP - General (Geriatric Medicine) Danella Sensing, MD as Consulting Physician (Dermatology) Sharyne Peach, MD as Consulting Physician (Ophthalmology) Irine Seal, MD as Attending Physician (Urology) [name], MD as Consulting Physician (Neurology)  Extended Emergency Contact Information Primary Emergency Contact: Stark,Kenneth Address: [address]          [place] [address] Kenneth Stark of [place] Phone: [phone] Relation: Spouse Secondary Emergency Contact: Kenneth Stark [place] of [place] Mobile Phone: [phone] Relation: Daughter  Code Status: DNR Goals of care: Advanced Directive information Advanced Directives 07/02/2017  Does Patient Have a Medical Advance Directive? Yes  Type of Advance Directive Out of facility DNR (pink MOST or yellow form);[place];Living will  Does patient want to make changes to medical advance directive? -  Copy of [place] in Chart? Yes  Would patient like information on creating a medical advance directive? -  Pre-existing out of facility DNR order (yellow form or pink MOST form) Yellow form placed in chart (order not valid for inpatient use)     Chief Complaint  Patient presents with  . Medical Management of Chronic Issues    HPI:  Pt is a 83 y.o. male seen today for medical management of chronic diseases.  He recently moved from AL enhanced to the skilled unit due to progressive weakness and need for the stand up lift.  He has a hx of vascular parksonism, cerebrovascular dz, TIA, memory loss, and gait disorder. Last MMSE 18/30 with a failed clock in April of 2018. He received an LP in August to evaluate for NPH but did not receive significant improvement  that would warrant a VP shunt.  He remains verbal and able to follow commands but confused about the details of his care and does not offer much in conversation.  The nurses feel that he is adjusting well to skilled care.  He has no complaints for my visit. In August he also had issues with htn and was started on norvasc. BP is 158/70 but other numbers in matrix are in the 140's. Off Remeron due to htn and weight gain, weights are trending down now Wt Readings from Last 3 Encounters:  07/02/17 175 lb 12.8 oz (79.7 kg)  05/29/17 186 lb (84.4 kg)  05/28/17 186 lb 12.8 oz (84.7 kg)  He is currently tapering off sinemet per neurology due to lack of benefit  Past Medical History:  Diagnosis Date  . Abnormality of gait   . Arthritis   . Brain bleed ([place]) 09/01/2016  . Cervical spondylosis   . Cervical spondylosis   . Degenerative joint disease (DJD) of lumbar spine   . Depression   . Diplopia   . Dyslipidemia   . Essential tremor   . Foul smelling urine   . Frequent falls   . GERD (gastroesophageal reflux disease)   . Glycosuria   . Hearing difficulty    hearing aids  . Hyperlipidemia   . Hypertension   . Hyperthyroidism   . Memory loss   . Osteoarthritis   . Radiculopathy, lumbar region   . 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 Small vessel disease   . TIA (transient ischemic attack)    Past  Surgical History:  Procedure Laterality Date  . APPENDECTOMY     done as a child  . GALLBLADDER SURGERY  2008  . KNEE ARTHROSCOPY Left    Dr. Hart Robinsons 2002  . knee injections Right    Dr. Adriana Mccallum  . TONSILLECTOMY     done as a child    No Known Allergies  Outpatient Encounter Prescriptions as of 07/02/2017  Medication Sig  . amLODipine (NORVASC) 10 MG tablet Take 1 tablet (10 mg total) by mouth daily.  Marland Kitchen aspirin 81 MG tablet Take 81 mg by mouth daily.  . Calcium Carb-Cholecalciferol 600-800 MG-UNIT CHEW Chew by mouth.  . carbidopa-levodopa  (SINEMET IR) 25-100 MG tablet Take 2 tablets by mouth 3 (three) times daily.  . clopidogrel (PLAVIX) 75 MG tablet Take 75 mg by mouth daily.  Marland Kitchen Co-Enzyme Q-10 100 MG CAPS Take 100 mg by mouth daily.  Marland Kitchen escitalopram (LEXAPRO) 10 MG tablet Take 10 mg by mouth daily.  . Lutein-Zeaxanthin 25-5 MG CAPS Take 1 capsule by mouth daily.  . Memantine HCl-Donepezil HCl (NAMZARIC) 28-10 MG CP24 Take 1 capsule by mouth every evening.  . Menthol, Topical Analgesic, (BIOFREEZE) 4 % GEL Apply 1 application topically 2 (two) times daily as needed.  . Misc Natural Products (GLUCOSAMINE CHOND COMPLEX/MSM PO) Take 1 tablet by mouth 3 (three) times daily.   . Multiple Vitamin (MULTI-VITAMINS) TABS Take by mouth.  . nebivolol (BYSTOLIC) 5 MG tablet Take 1 tablet (5 mg total) by mouth daily.  . Omega-3 Fatty Acids (OMEGA 3 PO) Take 1 capsule by mouth 2 (two) times daily.   . saw palmetto 500 MG capsule Take 500 mg by mouth daily.  Marland Kitchen senna-docusate (SENOKOT-S) 8.6-50 MG tablet Take 2 tablets by mouth at bedtime.  Marland Kitchen telmisartan (MICARDIS) 80 MG tablet Take 80 mg by mouth daily.  . traMADol (ULTRAM) 50 MG tablet Take by mouth every 8 (eight) hours as needed.  . triamcinolone (NASACORT) 55 MCG/ACT AERO nasal inhaler Place 2 sprays into the nose at bedtime.  . vitamin C (ASCORBIC ACID) 500 MG tablet Take 500 mg by mouth daily.   No facility-administered encounter medications on file as of 07/02/2017.     Review of Systems  Constitutional: Negative for activity change, appetite change, chills, diaphoresis, fatigue, fever and unexpected weight change.  Respiratory: Negative for cough, shortness of breath, wheezing and stridor.   Cardiovascular: Negative for chest pain, palpitations and leg swelling.  Gastrointestinal: Negative for abdominal distention, abdominal pain, constipation and diarrhea.  Genitourinary: Negative for difficulty urinating and dysuria.  Musculoskeletal: Positive for gait problem. Negative for  arthralgias, back pain, joint swelling and myalgias.  Neurological: Negative for dizziness, seizures, syncope, facial asymmetry, speech difficulty, weakness and headaches.  Hematological: Negative for adenopathy. Does not bruise/bleed easily.  Psychiatric/Behavioral: Positive for confusion. Negative for agitation and behavioral problems.    Immunization History  Administered Date(s) Administered  . Influenza, High Dose Seasonal PF 07/31/2016  . Influenza-Unspecified 08/01/2010, 07/30/2012, 10/16/2012, 10/16/2013  . Pneumococcal Polysaccharide-23 02/23/2004, 02/21/2013  . Pneumococcal-Unspecified 03/17/2016  . Td 12/02/2008  . Tdap 09/01/2016  . Zoster 03/28/2006   Pertinent  Health Maintenance Due  Topic Date Due  . PNA vac Low Risk Adult (2 of 2 - PCV13) 03/17/2017  . INFLUENZA VACCINE  05/16/2017   Fall Risk  04/04/2017 02/26/2017  Falls in the past year? No No   Functional Status Survey:    Vitals:   07/02/17 1557  BP: (!) 158/70  Pulse: Marland Kitchen)  50  Weight: 175 lb 12.8 oz (79.7 kg)   Body mass index is 25.96 kg/m.  Wt Readings from Last 3 Encounters:  07/02/17 175 lb 12.8 oz (79.7 kg)  05/29/17 186 lb (84.4 kg)  05/28/17 186 lb 12.8 oz (84.7 kg)    Physical Exam  Constitutional: No distress.  HENT:  Head: Normocephalic and atraumatic.  Neck: Normal range of motion. Neck supple. No JVD present. No tracheal deviation present. No thyromegaly present.  Cardiovascular: Normal rate and regular rhythm.   No murmur heard. Pulmonary/Chest: Effort normal and breath sounds normal. No respiratory distress. He has no wheezes.  Abdominal: Soft. Bowel sounds are normal. He exhibits no distension. There is no tenderness.  Musculoskeletal: He exhibits no edema or tenderness.  Lymphadenopathy:    He has no cervical adenopathy.  Neurological: He is alert.  No tremor noted today. Alert and oriented to self and place, not time  Skin: Skin is warm and dry. He is not diaphoretic.    Psychiatric: He has a normal mood and affect.    Labs reviewed:  Recent Labs  09/03/16 0235  09/07/16 1402 09/07/16 1844  09/09/16 0548 09/12/16 0212 09/15/16 0555  12/14/16 05/11/17 06/04/17  NA 138  < >  --   --   < > 137 136 135  < > 137 140 140  K 2.8*  < >  --   --   < > 3.9 3.4* 3.6  < > 3.9 3.9 4.1  CL 103  < >  --   --   < > 104 104 100*  --   --   --   --   CO2 26  < >  --   --   < > 24 25 24   --   --   --   --   GLUCOSE 195*  < >  --   --   < > 191* 129* 138*  --   --   --   --   BUN 19  < >  --   --   < > 21* 17 15  < > 26* 20 17  CREATININE 1.01  < >  --   --   < > 0.97 0.71 0.87  < > 1.1 0.9 0.9  CALCIUM 9.2  < >  --   --   < > 8.8* 8.6* 9.1  --   --   --   --   MG 1.9  --  1.8  --   --   --   --   --   --   --   --   --   PHOS  --   --   --  3.2  --   --   --   --   --   --   --   --   < > = values in this interval not displayed.  Recent Labs  09/07/16 1300 09/08/16 0603  AST 42* 40  ALT 53 53  ALKPHOS 51 44  BILITOT 0.5 0.5  PROT 5.7* 5.3*  ALBUMIN 2.9* 2.6*    Recent Labs  09/01/16 1201  09/07/16 1300 09/08/16 0603 09/09/16 0548 09/12/16 0212 10/18/16 12/14/16  WBC 17.4*  < > 12.1* 11.2* 10.9* 10.5 6.4 8.8  NEUTROABS 15.2*  --  8.9*  --   --  6.8  --   --   HGB 12.0*  < > 9.4* 9.1* 9.9* 10.4* 11.3* 12.2*  HCT  35.8*  < > 27.4* 27.0* 29.1* 31.0* 34* 36*  MCV 92.0  < > 90.7 91.2 90.4 91.7  --   --   PLT 220  < > 255 235 297 359  --  303  < > = values in this interval not displayed. Lab Results  Component Value Date   TSH 5.18 05/11/2017   Lab Results  Component Value Date   HGBA1C 6.8 (H) 09/07/2016   Lab Results  Component Value Date   CHOL 127 05/11/2017   HDL 50 05/11/2017   LDLCALC 58 05/11/2017   TRIG 98 05/11/2017    Significant Diagnostic Results in last 30 days:  Dg Fluoro Guided Loc Of Needle/cath Tip For Spinal Inject Lt  Result Date: 06/07/2017 CLINICAL DATA:  Gait abnormality. Evaluation for normal pressure  hydrocephalus. EXAM: DIAGNOSTIC LUMBAR PUNCTURE UNDER FLUOROSCOPIC GUIDANCE FLUOROSCOPY TIME:  Fluoroscopy Time:  3 seconds Radiation Exposure Index (if provided by the fluoroscopic device): 22.43 microGray*m^2 Number of Acquired Spot Images: 0 PROCEDURE: Informed consent was obtained from the patient prior to the procedure, including potential complications of headache, allergy, and pain. With the patient in the left lateral decubitus position, the lower back was prepped with Betadine. 1% Lidocaine was used for local anesthesia. Lumbar puncture was performed at the L3-4 level using a 3.5 inch 20 gauge needle via a right paramedian approach with return of clear CSF with an opening pressure of 20 cm water. 35 ml of CSF were obtained for laboratory studies. The patient tolerated the procedure well and there were no apparent complications. IMPRESSION: Successful fluoroscopic guided high volume lumbar puncture. Electronically Signed   By: Logan Bores M.D.   On: 06/07/2017 10:07    Assessment/Plan  1. Cerebrovascular disease Maintain bp within 150/90 goal Continue plavix and aspirin  LDL 58, takes omega 3  2. 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 Essential hypertension Improved I have asked the staff to continue to monitor his BP daily and report if consistently >150/90  5. Gait disorder No longer ambulatory, can propel with his feet in the [facility] Unclear if this is due to dementia, parkinsonism, or other neurodegenerative dz. Neurology does not feel that there is a reversible cause  6. Weight gain Weight trending back down off remeron, which is desirable   Family/ staff Communication: discussed with staff and resident  Labs/tests ordered:  NA

## 2017-07-04 ENCOUNTER — Encounter: Payer: Self-pay | Admitting: Internal Medicine

## 2017-07-06 DIAGNOSIS — G912 (Idiopathic) normal pressure hydrocephalus: Secondary | ICD-10-CM | POA: Diagnosis not present

## 2017-07-06 DIAGNOSIS — R262 Difficulty in walking, not elsewhere classified: Secondary | ICD-10-CM | POA: Diagnosis not present

## 2017-07-06 DIAGNOSIS — R2681 Unsteadiness on feet: Secondary | ICD-10-CM | POA: Diagnosis not present

## 2017-07-06 DIAGNOSIS — M25651 Stiffness of right hip, not elsewhere classified: Secondary | ICD-10-CM | POA: Diagnosis not present

## 2017-07-06 DIAGNOSIS — G214 Vascular parkinsonism: Secondary | ICD-10-CM | POA: Diagnosis not present

## 2017-07-06 DIAGNOSIS — M6389 Disorders of muscle in diseases classified elsewhere, multiple sites: Secondary | ICD-10-CM | POA: Diagnosis not present

## 2017-07-06 DIAGNOSIS — M25652 Stiffness of left hip, not elsewhere classified: Secondary | ICD-10-CM | POA: Diagnosis not present

## 2017-07-06 DIAGNOSIS — R278 Other lack of coordination: Secondary | ICD-10-CM | POA: Diagnosis not present

## 2017-07-06 DIAGNOSIS — R2689 Other abnormalities of gait and mobility: Secondary | ICD-10-CM | POA: Diagnosis not present

## 2017-07-06 DIAGNOSIS — M47816 Spondylosis without myelopathy or radiculopathy, lumbar region: Secondary | ICD-10-CM | POA: Diagnosis not present

## 2017-07-06 DIAGNOSIS — Z8673 Personal history of transient ischemic attack (TIA), and cerebral infarction without residual deficits: Secondary | ICD-10-CM | POA: Diagnosis not present

## 2017-07-09 DIAGNOSIS — R262 Difficulty in walking, not elsewhere classified: Secondary | ICD-10-CM | POA: Diagnosis not present

## 2017-07-09 DIAGNOSIS — M6389 Disorders of muscle in diseases classified elsewhere, multiple sites: Secondary | ICD-10-CM | POA: Diagnosis not present

## 2017-07-09 DIAGNOSIS — M25651 Stiffness of right hip, not elsewhere classified: Secondary | ICD-10-CM | POA: Diagnosis not present

## 2017-07-09 DIAGNOSIS — M25652 Stiffness of left hip, not elsewhere classified: Secondary | ICD-10-CM | POA: Diagnosis not present

## 2017-07-09 DIAGNOSIS — G912 (Idiopathic) normal pressure hydrocephalus: Secondary | ICD-10-CM | POA: Diagnosis not present

## 2017-07-09 DIAGNOSIS — R278 Other lack of coordination: Secondary | ICD-10-CM | POA: Diagnosis not present

## 2017-07-09 DIAGNOSIS — Z8673 Personal history of transient ischemic attack (TIA), and cerebral infarction without residual deficits: Secondary | ICD-10-CM | POA: Diagnosis not present

## 2017-07-09 DIAGNOSIS — R2689 Other abnormalities of gait and mobility: Secondary | ICD-10-CM | POA: Diagnosis not present

## 2017-07-09 DIAGNOSIS — M47816 Spondylosis without myelopathy or radiculopathy, lumbar region: Secondary | ICD-10-CM | POA: Diagnosis not present

## 2017-07-09 DIAGNOSIS — R2681 Unsteadiness on feet: Secondary | ICD-10-CM | POA: Diagnosis not present

## 2017-07-09 DIAGNOSIS — G214 Vascular parkinsonism: Secondary | ICD-10-CM | POA: Diagnosis not present

## 2017-07-11 DIAGNOSIS — R2689 Other abnormalities of gait and mobility: Secondary | ICD-10-CM | POA: Diagnosis not present

## 2017-07-11 DIAGNOSIS — M6389 Disorders of muscle in diseases classified elsewhere, multiple sites: Secondary | ICD-10-CM | POA: Diagnosis not present

## 2017-07-11 DIAGNOSIS — M25651 Stiffness of right hip, not elsewhere classified: Secondary | ICD-10-CM | POA: Diagnosis not present

## 2017-07-11 DIAGNOSIS — M25652 Stiffness of left hip, not elsewhere classified: Secondary | ICD-10-CM | POA: Diagnosis not present

## 2017-07-11 DIAGNOSIS — R2681 Unsteadiness on feet: Secondary | ICD-10-CM | POA: Diagnosis not present

## 2017-07-11 DIAGNOSIS — G214 Vascular parkinsonism: Secondary | ICD-10-CM | POA: Diagnosis not present

## 2017-07-11 DIAGNOSIS — R278 Other lack of coordination: Secondary | ICD-10-CM | POA: Diagnosis not present

## 2017-07-11 DIAGNOSIS — R262 Difficulty in walking, not elsewhere classified: Secondary | ICD-10-CM | POA: Diagnosis not present

## 2017-07-11 DIAGNOSIS — M47816 Spondylosis without myelopathy or radiculopathy, lumbar region: Secondary | ICD-10-CM | POA: Diagnosis not present

## 2017-07-11 DIAGNOSIS — Z8673 Personal history of transient ischemic attack (TIA), and cerebral infarction without residual deficits: Secondary | ICD-10-CM | POA: Diagnosis not present

## 2017-07-11 DIAGNOSIS — G912 (Idiopathic) normal pressure hydrocephalus: Secondary | ICD-10-CM | POA: Diagnosis not present

## 2017-07-12 DIAGNOSIS — B351 Tinea unguium: Secondary | ICD-10-CM | POA: Diagnosis not present

## 2017-07-13 DIAGNOSIS — R278 Other lack of coordination: Secondary | ICD-10-CM | POA: Diagnosis not present

## 2017-07-13 DIAGNOSIS — R2689 Other abnormalities of gait and mobility: Secondary | ICD-10-CM | POA: Diagnosis not present

## 2017-07-13 DIAGNOSIS — R262 Difficulty in walking, not elsewhere classified: Secondary | ICD-10-CM | POA: Diagnosis not present

## 2017-07-13 DIAGNOSIS — Z8673 Personal history of transient ischemic attack (TIA), and cerebral infarction without residual deficits: Secondary | ICD-10-CM | POA: Diagnosis not present

## 2017-07-13 DIAGNOSIS — M25652 Stiffness of left hip, not elsewhere classified: Secondary | ICD-10-CM | POA: Diagnosis not present

## 2017-07-13 DIAGNOSIS — M47816 Spondylosis without myelopathy or radiculopathy, lumbar region: Secondary | ICD-10-CM | POA: Diagnosis not present

## 2017-07-13 DIAGNOSIS — R2681 Unsteadiness on feet: Secondary | ICD-10-CM | POA: Diagnosis not present

## 2017-07-13 DIAGNOSIS — G214 Vascular parkinsonism: Secondary | ICD-10-CM | POA: Diagnosis not present

## 2017-07-13 DIAGNOSIS — M25651 Stiffness of right hip, not elsewhere classified: Secondary | ICD-10-CM | POA: Diagnosis not present

## 2017-07-13 DIAGNOSIS — M6389 Disorders of muscle in diseases classified elsewhere, multiple sites: Secondary | ICD-10-CM | POA: Diagnosis not present

## 2017-07-13 DIAGNOSIS — G912 (Idiopathic) normal pressure hydrocephalus: Secondary | ICD-10-CM | POA: Diagnosis not present

## 2017-07-17 DIAGNOSIS — G912 (Idiopathic) normal pressure hydrocephalus: Secondary | ICD-10-CM | POA: Diagnosis not present

## 2017-07-17 DIAGNOSIS — R2681 Unsteadiness on feet: Secondary | ICD-10-CM | POA: Diagnosis not present

## 2017-07-17 DIAGNOSIS — R278 Other lack of coordination: Secondary | ICD-10-CM | POA: Diagnosis not present

## 2017-07-17 DIAGNOSIS — R262 Difficulty in walking, not elsewhere classified: Secondary | ICD-10-CM | POA: Diagnosis not present

## 2017-07-17 DIAGNOSIS — M6389 Disorders of muscle in diseases classified elsewhere, multiple sites: Secondary | ICD-10-CM | POA: Diagnosis not present

## 2017-07-30 ENCOUNTER — Non-Acute Institutional Stay (SKILLED_NURSING_FACILITY): Payer: Medicare Other | Admitting: Adult Health

## 2017-07-30 ENCOUNTER — Encounter: Payer: Self-pay | Admitting: Adult Health

## 2017-07-30 DIAGNOSIS — F0150 Vascular dementia without behavioral disturbance: Secondary | ICD-10-CM

## 2017-07-30 DIAGNOSIS — G309 Alzheimer's disease, unspecified: Secondary | ICD-10-CM | POA: Diagnosis not present

## 2017-07-30 DIAGNOSIS — F4321 Adjustment disorder with depressed mood: Secondary | ICD-10-CM | POA: Diagnosis not present

## 2017-07-30 DIAGNOSIS — F0280 Dementia in other diseases classified elsewhere without behavioral disturbance: Secondary | ICD-10-CM

## 2017-07-30 NOTE — Progress Notes (Signed)
Location:  [facility] of Service:  SNF (31) Provider:   Cindi Carbon, ANP [place] [phone]  [name], DO  Patient Care Team: [name], DO as PCP - General (Geriatric Medicine) Danella Sensing, MD as Consulting Physician (Dermatology) Sharyne Peach, MD as Consulting Physician (Ophthalmology) Irine Seal, MD as Attending Physician (Urology) [name], MD as Consulting Physician (Neurology)  Extended Emergency Contact Information Primary Emergency Contact: Petrosyan,[name] Address: [address]          [place] [address] [name] of [place] Phone: [phone] Relation: Spouse Secondary Emergency Contact: [name] States of [place] Mobile Phone: [phone] Relation: Daughter  Code Status:  DNR Goals of care: Advanced Directive information Advanced Directives 07/02/2017  Does Patient Have a Medical Advance Directive? Yes  Type of Advance Directive Out of facility DNR (pink MOST or yellow form);[place];Living will  Does patient want to make changes to medical advance directive? -  Copy of [place] in Chart? Yes  Would patient like information on creating a medical advance directive? -  Pre-existing out of facility DNR order (yellow form or pink MOST form) Yellow form placed in chart (order not valid for inpatient use)     Chief Complaint  Patient presents with  . Acute Visit    ?depressed not feeling well    HPI:  Pt is a 83 y.o. male seen today for an acute visit for questionable depression and not feeling well. The nurse reports that today he has not seemed like himself. He was less jovial and talking less. His VS were stable but he said "I feel achy all over".  Later he grabbed his groin area one time. His wife is here and she feels he may be depressed about living in skilled care. He participates in activities when appropriate and  eats with his wife regularly. She would like him to have other dining mates that are more social.  She feels that he is having difficulty adjusting to skilled care. He denied and bladder pain or dysuria. He stated he felt better this afternoon and did not have a cough, congestion, pain or discomfort. He commented to me "I am anxious about my health and my future".  Mr. Artley has become progressively weaker and more dependent on staff over time.  His appetite seems normal per staff but he is having trouble getting food on his spoon and does not want help with feeding. No issues sleeping were reported.   Past Medical History:  Diagnosis Date  . Abnormality of gait   . Arthritis   . Brain bleed ([place]) 09/01/2016  . Cervical spondylosis   . Cervical spondylosis   . Degenerative joint disease (DJD) of lumbar spine   . Depression   . Diplopia   . Dyslipidemia   . Essential tremor   . Foul smelling urine   . Frequent falls   . GERD (gastroesophageal reflux disease)   . Glycosuria   . Hearing difficulty    hearing aids  . Hyperlipidemia   . Hypertension   . Hyperthyroidism   . Memory loss   . Osteoarthritis   . Radiculopathy, lumbar region   . 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 Small vessel disease   . TIA (transient ischemic attack)    Past Surgical History:  Procedure Laterality Date  . APPENDECTOMY  done as a child  . GALLBLADDER SURGERY  2008  . KNEE ARTHROSCOPY Left    Dr. Hart Robinsons 2002  . knee injections Right    Dr. Adriana Mccallum  . TONSILLECTOMY     done as a child    No Known Allergies  Outpatient Encounter Prescriptions as of 07/30/2017  Medication Sig  . amLODipine (NORVASC) 10 MG tablet Take 1 tablet (10 mg total) by mouth daily.  Marland Kitchen aspirin 81 MG tablet Take 81 mg by mouth daily.  . Calcium Carb-Cholecalciferol 600-800 MG-UNIT CHEW Chew by mouth.  . carboxymethylcellulose (REFRESH PLUS) 0.5 % SOLN 2 drops as needed.  .  clopidogrel (PLAVIX) 75 MG tablet Take 75 mg by mouth daily.  Marland Kitchen Co-Enzyme Q-10 100 MG CAPS Take 100 mg by mouth daily.  Marland Kitchen escitalopram (LEXAPRO) 10 MG tablet Take 10 mg by mouth daily.  . Lutein-Zeaxanthin 25-5 MG CAPS Take 1 capsule by mouth daily.  . Memantine HCl-Donepezil HCl (NAMZARIC) 28-10 MG CP24 Take 1 capsule by mouth every evening.  . Menthol, Topical Analgesic, (BIOFREEZE) 4 % GEL Apply 1 application topically 2 (two) times daily as needed.  . Misc Natural Products (GLUCOSAMINE CHOND COMPLEX/MSM PO) Take 1 tablet by mouth 3 (three) times daily.   . Multiple Vitamin (MULTI-VITAMINS) TABS Take by mouth.  . nebivolol (BYSTOLIC) 5 MG tablet Take 1 tablet (5 mg total) by mouth daily.  . Omega-3 Fatty Acids (OMEGA 3 PO) Take 1 capsule by mouth 2 (two) times daily.   . saw palmetto 500 MG capsule Take 500 mg by mouth daily.  Marland Kitchen senna-docusate (SENOKOT-S) 8.6-50 MG tablet Take 2 tablets by mouth at bedtime.  Marland Kitchen telmisartan (MICARDIS) 80 MG tablet Take 80 mg by mouth daily.  Marland Kitchen triamcinolone (NASACORT) 55 MCG/ACT AERO nasal inhaler Place 2 sprays into the nose at bedtime.  . vitamin C (ASCORBIC ACID) 500 MG tablet Take 500 mg by mouth daily.  . carbidopa-levodopa (SINEMET IR) 25-100 MG tablet Take 2 tablets by mouth 3 (three) times daily.  . [DISCONTINUED] traMADol (ULTRAM) 50 MG tablet Take by mouth every 8 (eight) hours as needed.   No facility-administered encounter medications on file as of 07/30/2017.     Review of Systems  Constitutional: Positive for fatigue. Negative for activity change, appetite change, chills, diaphoresis, fever and unexpected weight change.  HENT: Negative for congestion, rhinorrhea and trouble swallowing.   Eyes: Negative for visual disturbance.  Respiratory: Negative for cough, shortness of breath, wheezing and stridor.   Cardiovascular: Negative for chest pain, palpitations and leg swelling.  Gastrointestinal: Negative for abdominal distention, abdominal  pain, constipation and diarrhea.  Genitourinary: Negative for difficulty urinating and dysuria.  Musculoskeletal: Positive for gait problem. Negative for arthralgias, back pain, joint swelling and myalgias.  Neurological: Positive for weakness (general). Negative for dizziness, seizures, syncope, facial asymmetry, speech difficulty and headaches.  Hematological: Negative for adenopathy. Does not bruise/bleed easily.  Psychiatric/Behavioral: Positive for confusion. Negative for agitation, behavioral problems, self-injury, sleep disturbance and suicidal ideas. The patient is nervous/anxious.     Immunization History  Administered Date(s) Administered  . Influenza, High Dose Seasonal PF 07/31/2016  . Influenza-Unspecified 08/01/2010, 07/30/2012, 10/16/2012, 10/16/2013  . Pneumococcal Polysaccharide-23 02/23/2004, 02/21/2013  . Pneumococcal-Unspecified 03/17/2016  . Td 12/02/2008  . Tdap 09/01/2016  . Zoster 03/28/2006   Pertinent  Health Maintenance Due  Topic Date Due  . PNA vac Low Risk Adult (2 of 2 - PCV13) 03/17/2017  . INFLUENZA VACCINE  05/16/2017   Fall  Risk  04/04/2017 02/26/2017  Falls in the past year? No No   Functional Status Survey:    Vitals:   07/30/17 1410  BP: 140/60  Pulse: (!) 52  Resp: 18  Temp: 98 F (36.7 C)   There is no height or weight on file to calculate BMI. Physical Exam  Constitutional: No distress.  HENT:  Head: Normocephalic and atraumatic.  Nose: Nose normal.  Mouth/Throat: Oropharynx is clear and moist. No oropharyngeal exudate.  Eyes: Pupils are equal, round, and reactive to light. Conjunctivae are normal. Right eye exhibits no discharge. Left eye exhibits no discharge.  Neck: Normal range of motion. Neck supple. No JVD present. No tracheal deviation present. No thyromegaly present.  Cardiovascular: Normal rate and regular rhythm.   No murmur heard. Pulmonary/Chest: Effort normal and breath sounds normal. No respiratory distress. He has  no wheezes.  Abdominal: Soft. Bowel sounds are normal. He exhibits no distension. There is no tenderness.  Lymphadenopathy:    He has no cervical adenopathy.  Neurological: He is alert.  Oriented to self and place, no time  Skin: Skin is warm and dry. He is not diaphoretic.  Psychiatric: He has a normal mood and affect.    Labs reviewed:  Recent Labs  09/03/16 0235  09/07/16 1402 09/07/16 1844  09/09/16 0548 09/12/16 0212 09/15/16 0555  12/14/16 05/11/17 06/04/17  NA 138  < >  --   --   < > 137 136 135  < > 137 140 140  K 2.8*  < >  --   --   < > 3.9 3.4* 3.6  < > 3.9 3.9 4.1  CL 103  < >  --   --   < > 104 104 100*  --   --   --   --   CO2 26  < >  --   --   < > 24 25 24   --   --   --   --   GLUCOSE 195*  < >  --   --   < > 191* 129* 138*  --   --   --   --   BUN 19  < >  --   --   < > 21* 17 15  < > 26* 20 17  CREATININE 1.01  < >  --   --   < > 0.97 0.71 0.87  < > 1.1 0.9 0.9  CALCIUM 9.2  < >  --   --   < > 8.8* 8.6* 9.1  --   --   --   --   MG 1.9  --  1.8  --   --   --   --   --   --   --   --   --   PHOS  --   --   --  3.2  --   --   --   --   --   --   --   --   < > = values in this interval not displayed.  Recent Labs  09/07/16 1300 09/08/16 0603  AST 42* 40  ALT 53 53  ALKPHOS 51 44  BILITOT 0.5 0.5  PROT 5.7* 5.3*  ALBUMIN 2.9* 2.6*    Recent Labs  09/01/16 1201  09/07/16 1300 09/08/16 0603 09/09/16 0548 09/12/16 0212 10/18/16 12/14/16  WBC 17.4*  < > 12.1* 11.2* 10.9* 10.5 6.4 8.8  NEUTROABS 15.2*  --  8.9*  --   --  6.8  --   --   HGB 12.0*  < > 9.4* 9.1* 9.9* 10.4* 11.3* 12.2*  HCT 35.8*  < > 27.4* 27.0* 29.1* 31.0* 34* 36*  MCV 92.0  < > 90.7 91.2 90.4 91.7  --   --   PLT 220  < > 255 235 297 359  --  303  < > = values in this interval not displayed. Lab Results  Component Value Date   TSH 5.18 05/11/2017   Lab Results  Component Value Date   HGBA1C 6.8 (H) 09/07/2016   Lab Results  Component Value Date   CHOL 127 05/11/2017   HDL 50  05/11/2017   LDLCALC 58 05/11/2017   TRIG 98 05/11/2017    Significant Diagnostic Results in last 30 days:  No results found.  Assessment/Plan  1) Situational depression Expressed some depression and anxiety about his health and living in skilled care. I would like to give him a few more weeks before changing his medication. He appears fairly stable with a flat affect which is also affected by his dementia. I discussed this with his wife. We agreed to work on the social aspects of his care and getting him out of the room and participating in activities as much as possible.  If he does not adjust after more time here in skilled care we may consider a medication change I did not see anything physically wrong on my exam but I asked the nurse to monitor him and his VS and let me know if there are changes. He has not grabbed at his groin area further or expressed any signs of discomfort. If this occurs we will collect a urine.  Also will check follow up TSH as there was concern for subclinical hypothyroidism  2) Mixed Vascular/AD dementia Progressive decline in cognition and function Continue Comptroller Communication: discussed with nurse and staff   Labs/tests ordered:  UA C and S if urinary symptoms arise TSH in am

## 2017-07-31 DIAGNOSIS — R5381 Other malaise: Secondary | ICD-10-CM | POA: Diagnosis not present

## 2017-07-31 DIAGNOSIS — E039 Hypothyroidism, unspecified: Secondary | ICD-10-CM | POA: Diagnosis not present

## 2017-08-02 LAB — TSH: TSH: 2.4 (ref <=5.90)

## 2017-08-05 DIAGNOSIS — R509 Fever, unspecified: Secondary | ICD-10-CM | POA: Diagnosis not present

## 2017-08-05 DIAGNOSIS — N390 Urinary tract infection, site not specified: Secondary | ICD-10-CM | POA: Diagnosis not present

## 2017-08-05 DIAGNOSIS — Z79899 Other long term (current) drug therapy: Secondary | ICD-10-CM | POA: Diagnosis not present

## 2017-08-05 DIAGNOSIS — R319 Hematuria, unspecified: Secondary | ICD-10-CM | POA: Diagnosis not present

## 2017-08-07 ENCOUNTER — Encounter: Payer: Self-pay | Admitting: Internal Medicine

## 2017-08-07 ENCOUNTER — Telehealth: Payer: Self-pay | Admitting: Neurology

## 2017-08-07 ENCOUNTER — Non-Acute Institutional Stay (SKILLED_NURSING_FACILITY): Payer: Medicare Other | Admitting: Internal Medicine

## 2017-08-07 DIAGNOSIS — G214 Vascular parkinsonism: Secondary | ICD-10-CM | POA: Diagnosis not present

## 2017-08-07 DIAGNOSIS — I679 Cerebrovascular disease, unspecified: Secondary | ICD-10-CM | POA: Diagnosis not present

## 2017-08-07 DIAGNOSIS — F0280 Dementia in other diseases classified elsewhere without behavioral disturbance: Secondary | ICD-10-CM

## 2017-08-07 DIAGNOSIS — M6389 Disorders of muscle in diseases classified elsewhere, multiple sites: Secondary | ICD-10-CM | POA: Diagnosis not present

## 2017-08-07 DIAGNOSIS — F0150 Vascular dementia without behavioral disturbance: Secondary | ICD-10-CM

## 2017-08-07 DIAGNOSIS — K5901 Slow transit constipation: Secondary | ICD-10-CM

## 2017-08-07 DIAGNOSIS — N3000 Acute cystitis without hematuria: Secondary | ICD-10-CM | POA: Diagnosis not present

## 2017-08-07 DIAGNOSIS — F4321 Adjustment disorder with depressed mood: Secondary | ICD-10-CM

## 2017-08-07 DIAGNOSIS — G309 Alzheimer's disease, unspecified: Secondary | ICD-10-CM

## 2017-08-07 DIAGNOSIS — I10 Essential (primary) hypertension: Secondary | ICD-10-CM

## 2017-08-07 DIAGNOSIS — R278 Other lack of coordination: Secondary | ICD-10-CM | POA: Diagnosis not present

## 2017-08-07 MED ORDER — CARBIDOPA-LEVODOPA 25-100 MG PO TABS: 1.0000 | ORAL_TABLET | Freq: Three times a day (TID) | ORAL | 2 refills | Status: DC

## 2017-08-07 NOTE — Telephone Encounter (Signed)
Pt wife (on [place]) is asking for a call to discuss pt.  She states pt has pain in his side and is more rigid than before.  Pt wife states he no longer takes the sinimet and pt sits often with his eyes closed and wife wants to know if this is as a result of pt brain condition.  Please call

## 2017-08-07 NOTE — Addendum Note (Signed)
Addended by: [name] on: 08/07/2017 05:18 PM   Modules accepted: Orders

## 2017-08-07 NOTE — Progress Notes (Signed)
Location:  [place] of Service:  SNF (31)SNF Provider:  Toddy Boyd L. Mariea Clonts, D.O., C.M.D.  [name], DO  Patient Care Team: [name], DO as PCP - General (Geriatric Medicine) Danella Sensing, MD as Consulting Physician (Dermatology) Sharyne Peach, MD as Consulting Physician (Ophthalmology) Irine Seal, MD as Attending Physician (Urology) [name], MD as Consulting Physician (Neurology)  Extended Emergency Contact Information Primary Emergency Contact: Petterson,[name] Address: [address]          [place] [address] [name] of [place] Phone: [phone] Relation: Spouse Secondary Emergency Contact: [name] States of [place] Mobile Phone: [phone] Relation: Daughter  Code Status:  DNR Goals of care: Advanced Directive information Advanced Directives 07/02/2017  Does Patient Have a Medical Advance Directive? Yes  Type of Advance Directive Out of facility DNR (pink MOST or yellow form);[place];Living will  Does patient want to make changes to medical advance directive? -  Copy of [place] in Chart? Yes  Would patient like information on creating a medical advance directive? -  Pre-existing out of facility DNR order (yellow form or pink MOST form) Yellow form placed in chart (order not valid for inpatient use)     Chief Complaint  Patient presents with  . Dementia    acute  . Medical Management of Chronic Issues    routine    HPI:  Pt is a 83 y.o. male seen today for medical management of chronic diseases.  Resident is in his room resting with no complaints at this time.  Dementia- MMSE 18/30 failed clock on 01/17/17. Is oriented to self and place. Currently on Namzeric 28/10 HS. Has a flat affect. Utilizes wheelchair and needs assist with ADLs. Goes to meals with wife most of the time.   Situational Depression-Was seen by Cindi Carbon  10/18 regarding depression. Still seems down. Not very talkative. Is on Lexapro 10mg  daily.  Vascular Parkinson's- Wife is concerned that he is becoming more rigid, he is no longer on sinemet. This was stopped by neurology in September per family request.  Not ambulatory   Hypertension- BP within range for goals. Current medication includes telmisartan 80 mg daily, Bystolic 5mg  daily and Norvasc 10mg  daily.   Cerebrovascular disease- On Plavix 75mg  for stroke prevention  UTI- No complaints at this time. Started on Bactrim 10/22 for pos UA   Past Medical History:  Diagnosis Date  . Abnormality of gait   . Arthritis   . Brain bleed ([place]) 09/01/2016  . Cervical spondylosis   . Cervical spondylosis   . Degenerative joint disease (DJD) of lumbar spine   . Depression   . Diplopia   . Dyslipidemia   . Essential tremor   . Foul smelling urine   . Frequent falls   . GERD (gastroesophageal reflux disease)   . Glycosuria   . Hearing difficulty    hearing aids  . Hyperlipidemia   . Hypertension   . Hyperthyroidism   . Memory loss   . Osteoarthritis   . Radiculopathy, lumbar region   . 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 Small vessel disease   . TIA (transient ischemic attack)    Past Surgical History:  Procedure Laterality Date  . APPENDECTOMY     done as a child  . GALLBLADDER SURGERY  2008  . KNEE ARTHROSCOPY Left    Dr. Hart Robinsons 2002  .  knee injections Right    Dr. Adriana Mccallum  . TONSILLECTOMY     done as a child    No Known Allergies  Outpatient Encounter Prescriptions as of 08/07/2017  Medication Sig  . amLODipine (NORVASC) 10 MG tablet Take 1 tablet (10 mg total) by mouth daily.  Marland Kitchen aspirin 81 MG tablet Take 81 mg by mouth daily.  . bisacodyl (DULCOLAX) 10 MG suppository Place 10 mg rectally as needed for moderate constipation.  . Calcium Carb-Cholecalciferol 600-800 MG-UNIT CHEW Chew by mouth.  . carboxymethylcellulose (REFRESH  PLUS) 0.5 % SOLN 2 drops as needed.  . clopidogrel (PLAVIX) 75 MG tablet Take 75 mg by mouth daily.  Marland Kitchen Co-Enzyme Q-10 100 MG CAPS Take 100 mg by mouth daily.  Marland Kitchen escitalopram (LEXAPRO) 10 MG tablet Take 10 mg by mouth daily.  . Lutein-Zeaxanthin 25-5 MG CAPS Take 1 capsule by mouth daily.  . Memantine HCl-Donepezil HCl (NAMZARIC) 28-10 MG CP24 Take 1 capsule by mouth every evening.  . Menthol, Topical Analgesic, (BIOFREEZE) 4 % GEL Apply 1 application topically 2 (two) times daily as needed.  . Misc Natural Products (GLUCOSAMINE CHOND COMPLEX/MSM PO) Take 1 tablet by mouth 3 (three) times daily.   . Multiple Vitamin (MULTI-VITAMINS) TABS Take by mouth.  . nebivolol (BYSTOLIC) 5 MG tablet Take 1 tablet (5 mg total) by mouth daily.  . Omega-3 Fatty Acids (OMEGA 3 PO) Take 1 capsule by mouth 2 (two) times daily.   Marland Kitchen saccharomyces boulardii (FLORASTOR) 250 MG capsule Take 250 mg by mouth 2 (two) times daily.  . saw palmetto 500 MG capsule Take 500 mg by mouth daily.  Marland Kitchen senna-docusate (SENOKOT-S) 8.6-50 MG tablet Take 2 tablets by mouth at bedtime.  . [EXPIRED] sulfamethoxazole-trimethoprim (BACTRIM DS,SEPTRA DS) 800-160 MG tablet Take 1 tablet by mouth 2 (two) times daily.  Marland Kitchen telmisartan (MICARDIS) 80 MG tablet Take 80 mg by mouth daily.  Marland Kitchen triamcinolone (NASACORT) 55 MCG/ACT AERO nasal inhaler Place 2 sprays into the nose at bedtime.  . vitamin C (ASCORBIC ACID) 500 MG tablet Take 500 mg by mouth daily.  . [DISCONTINUED] carbidopa-levodopa (SINEMET IR) 25-100 MG tablet Take 2 tablets by mouth 3 (three) times daily.   No facility-administered encounter medications on file as of 08/07/2017.     Review of Systems  Unable to perform ROS: Dementia (Spoke with nurse concerning care)  Constitutional: Negative for fever.  HENT: Positive for hearing loss.   Eyes: Negative for blurred vision.       Glasses  Respiratory: Negative for cough and shortness of breath.   Cardiovascular: Negative for chest  pain, palpitations and leg swelling.  Gastrointestinal: Negative for abdominal pain.  Genitourinary:       Catheter at hs  Musculoskeletal: Positive for back pain and falls.  Neurological: Positive for dizziness and tremors. Negative for loss of consciousness.       Stiffness worse  Endo/Heme/Allergies: Bruises/bleeds easily.  Psychiatric/Behavioral: Positive for memory loss.    Immunization History  Administered Date(s) Administered  . Influenza, High Dose Seasonal PF 07/31/2016  . Influenza-Unspecified 08/01/2010, 07/30/2012, 10/16/2012, 10/16/2013  . Pneumococcal Polysaccharide-23 02/23/2004, 02/21/2013  . Pneumococcal-Unspecified 03/17/2016  . Td 12/02/2008  . Tdap 09/01/2016  . Zoster 03/28/2006   Pertinent  Health Maintenance Due  Topic Date Due  . PNA vac Low Risk Adult (2 of 2 - PCV13) 03/17/2017  . INFLUENZA VACCINE  05/16/2017   Fall Risk  04/04/2017 02/26/2017  Falls in the past year? No No  Functional Status Survey:   dependent in adls except to feed himself  Vitals:   08/07/17 1456  BP: (!) 149/53  Pulse: 60  Resp: 20  Temp: (!) 97 F (36.1 C)  TempSrc: Oral  SpO2: 96%  Weight: 172 lb (78 kg)   Body mass index is 25.4 kg/m. Physical Exam  Constitutional: He appears well-developed and well-nourished.  Cardiovascular: Normal rate, regular rhythm, normal heart sounds and intact distal pulses.   Pulmonary/Chest: Effort normal and breath sounds normal. No respiratory distress.  Abdominal: Soft. Bowel sounds are normal.  Neurological: He is alert. He is disoriented.  Oriented to self and place; severe rigidity of extremities  Skin: Skin is warm and dry. Capillary refill takes less than 2 seconds.  Psychiatric: His affect is blunt. He is slowed. Cognition and memory are impaired. He exhibits a depressed mood.  Not very interactive during exam    Labs reviewed:  Recent Labs  09/03/16 0235  09/07/16 1402 09/07/16 1844  09/09/16 0548 09/12/16 0212  09/15/16 0555  12/14/16 05/11/17 06/04/17  NA 138  < >  --   --   < > 137 136 135  < > 137 140 140  K 2.8*  < >  --   --   < > 3.9 3.4* 3.6  < > 3.9 3.9 4.1  CL 103  < >  --   --   < > 104 104 100*  --   --   --   --   CO2 26  < >  --   --   < > 24 25 24   --   --   --   --   GLUCOSE 195*  < >  --   --   < > 191* 129* 138*  --   --   --   --   BUN 19  < >  --   --   < > 21* 17 15  < > 26* 20 17  CREATININE 1.01  < >  --   --   < > 0.97 0.71 0.87  < > 1.1 0.9 0.9  CALCIUM 9.2  < >  --   --   < > 8.8* 8.6* 9.1  --   --   --   --   MG 1.9  --  1.8  --   --   --   --   --   --   --   --   --   PHOS  --   --   --  3.2  --   --   --   --   --   --   --   --   < > = values in this interval not displayed.  Recent Labs  09/07/16 1300 09/08/16 0603  AST 42* 40  ALT 53 53  ALKPHOS 51 44  BILITOT 0.5 0.5  PROT 5.7* 5.3*  ALBUMIN 2.9* 2.6*    Recent Labs  09/01/16 1201  09/07/16 1300 09/08/16 0603 09/09/16 0548 09/12/16 0212 10/18/16 12/14/16  WBC 17.4*  < > 12.1* 11.2* 10.9* 10.5 6.4 8.8  NEUTROABS 15.2*  --  8.9*  --   --  6.8  --   --   HGB 12.0*  < > 9.4* 9.1* 9.9* 10.4* 11.3* 12.2*  HCT 35.8*  < > 27.4* 27.0* 29.1* 31.0* 34* 36*  MCV 92.0  < > 90.7 91.2 90.4 91.7  --   --  PLT 220  < > 255 235 297 359  --  303  < > = values in this interval not displayed. Lab Results  Component Value Date   TSH 5.18 05/11/2017   Lab Results  Component Value Date   HGBA1C 6.8 (H) 09/07/2016   Lab Results  Component Value Date   CHOL 127 05/11/2017   HDL 50 05/11/2017   LDLCALC 58 05/11/2017   TRIG 98 05/11/2017    Significant Diagnostic Results in last 30 days:  No results found.  Assessment/Plan 1. Essential hypertension At goal. Continue current medications Bystolic, telmisartan, norvasc  2. Mixed Alzheimer's and vascular dementia Continue Namzaric. Expect function to continue to decline due to dementia and vascular Parkinsonism  3. Vascular parkinsonism [facility]) [place]  Neurology consult and recommendations--currently off sinemet at family request with significant increase in his rigidity   4. Situational depression On lexapro 10mg  but remains depressed. Will continue to watch and increase dose if needed.  Likely partially due to #3 and masked facies, as well, plus progression physical decline.  5. Cerebrovascular disease Continue Plavix as ordered  6. Acute cystitis without hematuria Continue Bactrim. Culture pending.  Uses condom cath at night  7.  Slow transit constipation Due to Parkinsonism, immobility, age -was disimpacted today and immediate relief in pain occurred (there had been consideration of scheduled tylenol for pain--has h/o back pain, but seems he was relieved with a bm -has prn dulcolax supps, might benefit from an oral agent as well if these are getting used regularly  Family/ staff Communication: Spoke with SNF nurse  Labs/tests ordered: None  Zalma Channing L. Skylur Fuston, D.O. [facility] [address], [place] [address] Cell Phone (Mon-Fri 8am-5pm):  [phone] On Call:  [phone] & follow prompts after 5pm & weekends Office Phone:  [phone] Office Fax:  [phone]

## 2017-08-07 NOTE — Telephone Encounter (Signed)
The patient is residing at [facility].  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  He is having some discomfort in the legs associated with the rigidity.  We will get him back on Sinemet 25/100 taking one half tablet 3 times daily for 2 weeks and then take 1 full tablet 3 times daily.

## 2017-08-08 DIAGNOSIS — M6389 Disorders of muscle in diseases classified elsewhere, multiple sites: Secondary | ICD-10-CM | POA: Diagnosis not present

## 2017-08-08 DIAGNOSIS — R278 Other lack of coordination: Secondary | ICD-10-CM | POA: Diagnosis not present

## 2017-08-08 DIAGNOSIS — I679 Cerebrovascular disease, unspecified: Secondary | ICD-10-CM | POA: Diagnosis not present

## 2017-08-08 DIAGNOSIS — G214 Vascular parkinsonism: Secondary | ICD-10-CM | POA: Diagnosis not present

## 2017-08-09 DIAGNOSIS — R278 Other lack of coordination: Secondary | ICD-10-CM | POA: Diagnosis not present

## 2017-08-09 DIAGNOSIS — G214 Vascular parkinsonism: Secondary | ICD-10-CM | POA: Diagnosis not present

## 2017-08-09 DIAGNOSIS — M6389 Disorders of muscle in diseases classified elsewhere, multiple sites: Secondary | ICD-10-CM | POA: Diagnosis not present

## 2017-08-09 DIAGNOSIS — I679 Cerebrovascular disease, unspecified: Secondary | ICD-10-CM | POA: Diagnosis not present

## 2017-08-13 DIAGNOSIS — M6389 Disorders of muscle in diseases classified elsewhere, multiple sites: Secondary | ICD-10-CM | POA: Diagnosis not present

## 2017-08-13 DIAGNOSIS — G214 Vascular parkinsonism: Secondary | ICD-10-CM | POA: Diagnosis not present

## 2017-08-13 DIAGNOSIS — R278 Other lack of coordination: Secondary | ICD-10-CM | POA: Diagnosis not present

## 2017-08-13 DIAGNOSIS — I679 Cerebrovascular disease, unspecified: Secondary | ICD-10-CM | POA: Diagnosis not present

## 2017-08-14 DIAGNOSIS — I679 Cerebrovascular disease, unspecified: Secondary | ICD-10-CM | POA: Diagnosis not present

## 2017-08-14 DIAGNOSIS — R278 Other lack of coordination: Secondary | ICD-10-CM | POA: Diagnosis not present

## 2017-08-14 DIAGNOSIS — G214 Vascular parkinsonism: Secondary | ICD-10-CM | POA: Diagnosis not present

## 2017-08-14 DIAGNOSIS — M6389 Disorders of muscle in diseases classified elsewhere, multiple sites: Secondary | ICD-10-CM | POA: Diagnosis not present

## 2017-08-16 DIAGNOSIS — M6389 Disorders of muscle in diseases classified elsewhere, multiple sites: Secondary | ICD-10-CM | POA: Diagnosis not present

## 2017-08-16 DIAGNOSIS — I679 Cerebrovascular disease, unspecified: Secondary | ICD-10-CM | POA: Diagnosis not present

## 2017-08-16 DIAGNOSIS — R278 Other lack of coordination: Secondary | ICD-10-CM | POA: Diagnosis not present

## 2017-08-16 DIAGNOSIS — G214 Vascular parkinsonism: Secondary | ICD-10-CM | POA: Diagnosis not present

## 2017-08-17 DIAGNOSIS — I679 Cerebrovascular disease, unspecified: Secondary | ICD-10-CM | POA: Diagnosis not present

## 2017-08-17 DIAGNOSIS — G214 Vascular parkinsonism: Secondary | ICD-10-CM | POA: Diagnosis not present

## 2017-08-17 DIAGNOSIS — M6389 Disorders of muscle in diseases classified elsewhere, multiple sites: Secondary | ICD-10-CM | POA: Diagnosis not present

## 2017-08-17 DIAGNOSIS — R278 Other lack of coordination: Secondary | ICD-10-CM | POA: Diagnosis not present

## 2017-08-20 DIAGNOSIS — M6389 Disorders of muscle in diseases classified elsewhere, multiple sites: Secondary | ICD-10-CM | POA: Diagnosis not present

## 2017-08-20 DIAGNOSIS — I679 Cerebrovascular disease, unspecified: Secondary | ICD-10-CM | POA: Diagnosis not present

## 2017-08-20 DIAGNOSIS — R278 Other lack of coordination: Secondary | ICD-10-CM | POA: Diagnosis not present

## 2017-08-20 DIAGNOSIS — G214 Vascular parkinsonism: Secondary | ICD-10-CM | POA: Diagnosis not present

## 2017-08-21 ENCOUNTER — Non-Acute Institutional Stay (SKILLED_NURSING_FACILITY): Payer: Medicare Other

## 2017-08-21 DIAGNOSIS — Z0000 Encounter for general adult medical examination without abnormal findings: Secondary | ICD-10-CM | POA: Diagnosis not present

## 2017-08-21 NOTE — Progress Notes (Signed)
Subjective:   Kenneth Stark is a 83 y.o. male who presents for an Initial Medicare Annual Wellness Visit at [facility]      Objective:    Today's Vitals   08/21/17 1347  BP: (!) 134/58  Pulse: (!) 53  Temp: 97.8 F (36.6 C)  TempSrc: Oral  SpO2: 96%  Weight: 172 lb (78 kg)  Height: 5\' 9"  (1.753 m)   Body mass index is 25.4 kg/m.  Current Medications (verified) Outpatient Encounter Medications as of 08/21/2017  Medication Sig  . amLODipine (NORVASC) 10 MG tablet Take 1 tablet (10 mg total) by mouth daily.  Marland Kitchen aspirin 81 MG tablet Take 81 mg by mouth daily.  . bisacodyl (DULCOLAX) 10 MG suppository Place 10 mg rectally as needed for moderate constipation.  . Calcium Carb-Cholecalciferol 600-800 MG-UNIT CHEW Chew by mouth.  . carbidopa-levodopa (SINEMET IR) 25-100 MG tablet Take 1 tablet by mouth 3 (three) times daily.  . carboxymethylcellulose (REFRESH PLUS) 0.5 % SOLN 2 drops as needed.  . clopidogrel (PLAVIX) 75 MG tablet Take 75 mg by mouth daily.  Marland Kitchen Co-Enzyme Q-10 100 MG CAPS Take 100 mg by mouth daily.  Marland Kitchen escitalopram (LEXAPRO) 10 MG tablet Take 10 mg by mouth daily.  . Lutein-Zeaxanthin 25-5 MG CAPS Take 1 capsule by mouth daily.  . Memantine HCl-Donepezil HCl (NAMZARIC) 28-10 MG CP24 Take 1 capsule by mouth every evening.  . Menthol, Topical Analgesic, (BIOFREEZE) 4 % GEL Apply 1 application topically 2 (two) times daily as needed.  . Misc Natural Products (GLUCOSAMINE CHOND COMPLEX/MSM PO) Take 1 tablet by mouth 3 (three) times daily.   . Multiple Vitamin (MULTI-VITAMINS) TABS Take by mouth.  . nebivolol (BYSTOLIC) 5 MG tablet Take 1 tablet (5 mg total) by mouth daily.  . Omega-3 Fatty Acids (OMEGA 3 PO) Take 1 capsule by mouth 2 (two) times daily.   Marland Kitchen saccharomyces boulardii (FLORASTOR) 250 MG capsule Take 250 mg by mouth 2 (two) times daily.  . saw palmetto 500 MG capsule Take 500 mg by mouth daily.  Marland Kitchen senna-docusate (SENOKOT-S) 8.6-50 MG tablet  Take 2 tablets by mouth at bedtime.  . triamcinolone (NASACORT) 55 MCG/ACT AERO nasal inhaler Place 2 sprays into the nose at bedtime.  . vitamin C (ASCORBIC ACID) 500 MG tablet Take 500 mg by mouth daily.  . [DISCONTINUED] telmisartan (MICARDIS) 80 MG tablet Take 80 mg by mouth daily.   No facility-administered encounter medications on file as of 08/21/2017.     Allergies (verified) Patient has no known allergies.   History: Past Medical History:  Diagnosis Date  . Abnormality of gait   . Arthritis   . Brain bleed ([place]) 09/01/2016  . Cervical spondylosis   . Cervical spondylosis   . Degenerative joint disease (DJD) of lumbar spine   . Depression   . Diplopia   . Dyslipidemia   . Essential tremor   . Foul smelling urine   . Frequent falls   . GERD (gastroesophageal reflux disease)   . Glycosuria   . Hearing difficulty    hearing aids  . Hyperlipidemia   . Hypertension   . Hyperthyroidism   . Memory loss   . Osteoarthritis   . Radiculopathy, lumbar region   . 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 Small vessel disease   . TIA (transient ischemic attack)    Past Surgical History:  Procedure Laterality Date  . APPENDECTOMY  done as a child  . GALLBLADDER SURGERY  2008  . KNEE ARTHROSCOPY Left    Dr. Hart Robinsons 2002  . knee injections Right    Dr. Adriana Mccallum  . TONSILLECTOMY     done as a child   Family History  Problem Relation Age of Onset  . Stroke Mother   . Dementia Mother   . Stroke Father   . Heart disease Father   . Diabetes Father   . Dementia Brother   . Renal Disease Brother   . Diabetes Brother   . Renal Disease Daughter    Social History   Occupational History  . Occupation: Retired    Comment: [employer]  Tobacco Use  . Smoking status: Never Smoker  . Smokeless tobacco: Never Used  Substance and Sexual Activity  . Alcohol use: Yes    Comment: Wife occass/rare  . Drug use: No  . Sexual  activity: Not on file   Tobacco Counseling Counseling given: Not Answered   Activities of Daily Living In your present state of health, do you have any difficulty performing the following activities: 08/21/2017 09/08/2016  Hearing? Tempie Donning  Vision? Y N  Difficulty concentrating or making decisions? Tempie Donning  Walking or climbing stairs? Y Y  Dressing or bathing? Y Y  Doing errands, shopping? Tempie Donning  Preparing Food and eating ? Y -  Using the Toilet? Y -  In the past six months, have you accidently leaked urine? Y -  Do you have problems with loss of bowel control? Y -  Managing your Medications? Y -  Managing your Finances? Y -  Housekeeping or managing your Housekeeping? Y -  Some recent data might be hidden    Immunizations and Health Maintenance Immunization History  Administered Date(s) Administered  . Influenza, High Dose Seasonal PF 07/31/2016  . Influenza-Unspecified 08/01/2010, 07/30/2012, 10/16/2012, 10/16/2013  . Pneumococcal Polysaccharide-23 02/23/2004, 02/21/2013  . Pneumococcal-Unspecified 03/17/2016  . Td 12/02/2008  . Tdap 09/01/2016  . Zoster 03/28/2006   Health Maintenance Due  Topic Date Due  . PNA vac Low Risk Adult (2 of 2 - PCV13) 03/17/2017  . INFLUENZA VACCINE  05/16/2017    Patient Care Team: [name], DO as PCP - General (Geriatric Medicine) Danella Sensing, MD as Consulting Physician (Dermatology) Sharyne Peach, MD as Consulting Physician (Ophthalmology) Irine Seal, MD as Attending Physician (Urology) [name], MD as Consulting Physician (Neurology)  Indicate any recent Medical Services you may have received from other than [facility] providers in the past year (date may be approximate).    Assessment:   This is a routine wellness examination for Diaz.    Hearing/Vision screen No exam data present  Dietary issues and exercise activities discussed: Current Exercise Habits: The patient does not participate in regular exercise at  present, Exercise limited by: neurologic condition(s);orthopedic condition(s)  Goals    None     Depression Screen PHQ 2/9 Scores 08/21/2017 04/04/2017  PHQ - 2 Score 1 0    Fall Risk Fall Risk  08/21/2017 04/04/2017 02/26/2017  Falls in the past year? No No No    Cognitive Function: MMSE - Mini Mental State Exam 01/17/2017  Orientation to time 4  Orientation to Place 5  Registration 3  Attention/ Calculation 0  Recall 0  Language- name 2 objects 2  Language- repeat 1  Language- follow 3 step command 3  Language- read & follow direction 0  Write a sentence 0  Copy  design 0  Total score 18        Screening Tests Health Maintenance  Topic Date Due  . PNA vac Low Risk Adult (2 of 2 - PCV13) 03/17/2017  . INFLUENZA VACCINE  05/16/2017  . TETANUS/TDAP  09/01/2026        Plan:    I have personally reviewed and addressed the Medicare Annual Wellness questionnaire and have noted the following in the patient's chart:  A. Medical and social history B. Use of alcohol, tobacco or illicit drugs  C. Current medications and supplements D. Functional ability and status E.  Nutritional status F.  Physical activity G. Advance directives H. List of other physicians I.  Hospitalizations, surgeries, and ER visits in previous 12 months J.  [place] to include hearing, vision, cognitive, depression L. Referrals and appointments - none  In addition, I have reviewed and discussed with patient certain preventive protocols, quality metrics, and best practice recommendations. A written personalized care plan for preventive services as well as general preventive health recommendations were provided to patient.  See attached scanned questionnaire for additional information.   Signed,   Rich Reining, RN Nurse Health Advisor   Quick Notes   Health Maintenance: Flu vaccine due. Facility will give.     Abnormal Screen: MMSE 01/17/2017 18/30. Did not pass clock  drawing     Patient Concerns: None     Nurse Concerns: None

## 2017-08-21 NOTE — Patient Instructions (Signed)
Kenneth Stark , Thank you for taking time to come for your Medicare Wellness Visit. I appreciate your ongoing commitment to your health goals. Please review the following plan we discussed and let me know if I can assist you in the future.   Screening recommendations/referrals: Colonoscopy excluded, pt is over age 81 Recommended yearly ophthalmology/optometry visit for glaucoma screening and checkup Recommended yearly dental visit for hygiene and checkup  Vaccinations: Influenza vaccine due, facility will give Pneumococcal vaccine up to date Tdap vaccine up to date. Due 09/01/2026 Shingles vaccine not in records    Advanced directives: In Chart  Conditions/risks identified: None  Next appointment: Dr. Mariea Clonts makes rounds  Preventive Care 18 Years and Older, Male Preventive care refers to lifestyle choices and visits with your health care provider that can promote health and wellness. What does preventive care include?  A yearly physical exam. This is also called an annual well check.  Dental exams once or twice a year.  Routine eye exams. Ask your health care provider how often you should have your eyes checked.  Personal lifestyle choices, including:  Daily care of your teeth and gums.  Regular physical activity.  Eating a healthy diet.  Avoiding tobacco and drug use.  Limiting alcohol use.  Practicing safe sex.  Taking low doses of aspirin every day.  Taking vitamin and mineral supplements as recommended by your health care provider. What happens during an annual well check? The services and screenings done by your health care provider during your annual well check will depend on your age, overall health, lifestyle risk factors, and family history of disease. Counseling  Your health care provider may ask you questions about your:  Alcohol use.  Tobacco use.  Drug use.  Emotional well-being.  Home and relationship well-being.  Sexual activity.  Eating  habits.  History of falls.  Memory and ability to understand (cognition).  Work and work Statistician. Screening  You may have the following tests or measurements:  Height, weight, and BMI.  Blood pressure.  Lipid and cholesterol levels. These may be checked every 5 years, or more frequently if you are over 80 years old.  Skin check.  Lung cancer screening. You may have this screening every year starting at age 34 if you have a 30-pack-year history of smoking and currently smoke or have quit within the past 15 years.  Fecal occult blood test (FOBT) of the stool. You may have this test every year starting at age 5.  Flexible sigmoidoscopy or colonoscopy. You may have a sigmoidoscopy every 5 years or a colonoscopy every 10 years starting at age 56.  Prostate cancer screening. Recommendations will vary depending on your family history and other risks.  Hepatitis C blood test.  Hepatitis B blood test.  Sexually transmitted disease (STD) testing.  Diabetes screening. This is done by checking your blood sugar (glucose) after you have not eaten for a while (fasting). You may have this done every 1-3 years.  Abdominal aortic aneurysm (AAA) screening. You may need this if you are a current or former smoker.  Osteoporosis. You may be screened starting at age 78 if you are at high risk. Talk with your health care provider about your test results, treatment options, and if necessary, the need for more tests. Vaccines  Your health care provider may recommend certain vaccines, such as:  Influenza vaccine. This is recommended every year.  Tetanus, diphtheria, and acellular pertussis (Tdap, Td) vaccine. You may need a Td booster every  10 years.  Zoster vaccine. You may need this after age 21.  Pneumococcal 13-valent conjugate (PCV13) vaccine. One dose is recommended after age 65.  Pneumococcal polysaccharide (PPSV23) vaccine. One dose is recommended after age 47. Talk to your health  care provider about which screenings and vaccines you need and how often you need them. This information is not intended to replace advice given to you by your health care provider. Make sure you discuss any questions you have with your health care provider. Document Released: 10/29/2015 Document Revised: 06/21/2016 Document Reviewed: 08/03/2015 Elsevier Interactive Patient Education  2017 [place] Prevention in the Home Falls can cause injuries. They can happen to people of all ages. There are many things you can do to make your home safe and to help prevent falls. What can I do on the outside of my home?  Regularly fix the edges of walkways and driveways and fix any cracks.  Remove anything that might make you trip as you walk through a door, such as a raised step or threshold.  Trim any bushes or trees on the path to your home.  Use bright outdoor lighting.  Clear any walking paths of anything that might make someone trip, such as rocks or tools.  Regularly check to see if handrails are loose or broken. Make sure that both sides of any steps have handrails.  Any raised decks and porches should have guardrails on the edges.  Have any leaves, snow, or ice cleared regularly.  Use sand or salt on walking paths during winter.  Clean up any spills in your garage right away. This includes oil or grease spills. What can I do in the bathroom?  Use night lights.  Install grab bars by the toilet and in the tub and shower. Do not use towel bars as grab bars.  Use non-skid mats or decals in the tub or shower.  If you need to sit down in the shower, use a plastic, non-slip stool.  Keep the floor dry. Clean up any water that spills on the floor as soon as it happens.  Remove soap buildup in the tub or shower regularly.  Attach bath mats securely with double-sided non-slip rug tape.  Do not have throw rugs and other things on the floor that can make you trip. What can I do  in the bedroom?  Use night lights.  Make sure that you have a light by your bed that is easy to reach.  Do not use any sheets or blankets that are too big for your bed. They should not hang down onto the floor.  Have a firm chair that has side arms. You can use this for support while you get dressed.  Do not have throw rugs and other things on the floor that can make you trip. What can I do in the kitchen?  Clean up any spills right away.  Avoid walking on wet floors.  Keep items that you use a lot in easy-to-reach places.  If you need to reach something above you, use a strong step stool that has a grab bar.  Keep electrical cords out of the way.  Do not use floor polish or wax that makes floors slippery. If you must use wax, use non-skid floor wax.  Do not have throw rugs and other things on the floor that can make you trip. What can I do with my stairs?  Do not leave any items on the stairs.  Make sure  that there are handrails on both sides of the stairs and use them. Fix handrails that are broken or loose. Make sure that handrails are as long as the stairways.  Check any carpeting to make sure that it is firmly attached to the stairs. Fix any carpet that is loose or worn.  Avoid having throw rugs at the top or bottom of the stairs. If you do have throw rugs, attach them to the floor with carpet tape.  Make sure that you have a light switch at the top of the stairs and the bottom of the stairs. If you do not have them, ask someone to add them for you. What else can I do to help prevent falls?  Wear shoes that:  Do not have high heels.  Have rubber bottoms.  Are comfortable and fit you well.  Are closed at the toe. Do not wear sandals.  If you use a stepladder:  Make sure that it is fully opened. Do not climb a closed stepladder.  Make sure that both sides of the stepladder are locked into place.  Ask someone to hold it for you, if possible.  Clearly mark  and make sure that you can see:  Any grab bars or handrails.  First and last steps.  Where the edge of each step is.  Use tools that help you move around (mobility aids) if they are needed. These include:  Canes.  Walkers.  Scooters.  Crutches.  Turn on the lights when you go into a dark area. Replace any light bulbs as soon as they burn out.  Set up your furniture so you have a clear path. Avoid moving your furniture around.  If any of your floors are uneven, fix them.  If there are any pets around you, be aware of where they are.  Review your medicines with your doctor. Some medicines can make you feel dizzy. This can increase your chance of falling. Ask your doctor what other things that you can do to help prevent falls. This information is not intended to replace advice given to you by your health care provider. Make sure you discuss any questions you have with your health care provider. Document Released: 07/29/2009 Document Revised: 03/09/2016 Document Reviewed: 11/06/2014 Elsevier Interactive Patient Education  2017 [employer].

## 2017-08-23 DIAGNOSIS — M6389 Disorders of muscle in diseases classified elsewhere, multiple sites: Secondary | ICD-10-CM | POA: Diagnosis not present

## 2017-08-23 DIAGNOSIS — R278 Other lack of coordination: Secondary | ICD-10-CM | POA: Diagnosis not present

## 2017-08-23 DIAGNOSIS — G214 Vascular parkinsonism: Secondary | ICD-10-CM | POA: Diagnosis not present

## 2017-08-23 DIAGNOSIS — I679 Cerebrovascular disease, unspecified: Secondary | ICD-10-CM | POA: Diagnosis not present

## 2017-08-24 DIAGNOSIS — I679 Cerebrovascular disease, unspecified: Secondary | ICD-10-CM | POA: Diagnosis not present

## 2017-08-24 DIAGNOSIS — R278 Other lack of coordination: Secondary | ICD-10-CM | POA: Diagnosis not present

## 2017-08-24 DIAGNOSIS — G214 Vascular parkinsonism: Secondary | ICD-10-CM | POA: Diagnosis not present

## 2017-08-24 DIAGNOSIS — M6389 Disorders of muscle in diseases classified elsewhere, multiple sites: Secondary | ICD-10-CM | POA: Diagnosis not present

## 2017-08-27 ENCOUNTER — Ambulatory Visit: Payer: Medicare Other | Admitting: Neurology

## 2017-08-27 ENCOUNTER — Encounter: Payer: Self-pay | Admitting: Neurology

## 2017-08-27 VITALS — BP 105/60 | HR 61 | Ht 69.0 in

## 2017-08-27 DIAGNOSIS — M6389 Disorders of muscle in diseases classified elsewhere, multiple sites: Secondary | ICD-10-CM | POA: Diagnosis not present

## 2017-08-27 DIAGNOSIS — G214 Vascular parkinsonism: Secondary | ICD-10-CM | POA: Diagnosis not present

## 2017-08-27 DIAGNOSIS — R269 Unspecified abnormalities of gait and mobility: Secondary | ICD-10-CM | POA: Diagnosis not present

## 2017-08-27 DIAGNOSIS — I679 Cerebrovascular disease, unspecified: Secondary | ICD-10-CM | POA: Diagnosis not present

## 2017-08-27 DIAGNOSIS — R278 Other lack of coordination: Secondary | ICD-10-CM | POA: Diagnosis not present

## 2017-08-27 NOTE — Progress Notes (Signed)
Reason for visit: Gait disorder  Kenneth Stark is an 83 y.o. male  History of present illness:  Kenneth Stark is an 83 year old right-handed white male with a history of a gait disorder associated with a parkinsonism syndrome.  The patient had undergone a lumbar puncture to look for normal pressure hydrocephalus, there was no definite benefit with the spinal tap.  The patient had been tapered off of his Sinemet but he developed significant stiffness of all 4 extremities he has been placed back on 1 tablet 3 times daily of the 25/100 mg tablets.  The patient recently had some confusion associated with a urinary tract infection.  Family indicates that he does have a mild memory disorder.  He is unable to ambulate whatsoever.  He is in an extended care facility, [place].  The patient requires assistance for bathing, dressing, and taking care of toilet needs.  He is able to feed himself.  He has not had any recent falls.  He has a very definite tendency to lean backwards when he attempts to stand up.  Past Medical History:  Diagnosis Date  . Abnormality of gait   . Arthritis   . Brain bleed ([place]) 09/01/2016  . Cervical spondylosis   . Cervical spondylosis   . Degenerative joint disease (DJD) of lumbar spine   . Depression   . Diplopia   . Dyslipidemia   . Essential tremor   . Foul smelling urine   . Frequent falls   . GERD (gastroesophageal reflux disease)   . Glycosuria   . Hearing difficulty    hearing aids  . Hyperlipidemia   . Hypertension   . Hyperthyroidism   . Memory loss   . Osteoarthritis   . Radiculopathy, lumbar region   . 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 Small vessel disease   . TIA (transient ischemic attack)     Past Surgical History:  Procedure Laterality Date  . APPENDECTOMY     done as a child  . GALLBLADDER SURGERY  2008  . KNEE ARTHROSCOPY Left    Dr. Hart Robinsons 2002  . knee injections Right    Dr. Adriana Mccallum    . TONSILLECTOMY     done as a child    Family History  Problem Relation Age of Onset  . Stroke Mother   . Dementia Mother   . Stroke Father   . Heart disease Father   . Diabetes Father   . Dementia Brother   . Renal Disease Brother   . Diabetes Brother   . Renal Disease Daughter     Social history:  reports that  has never smoked. he has never used smokeless tobacco. He reports that he drinks alcohol. He reports that he does not use drugs.   No Known Allergies  Medications:  Prior to Admission medications   Medication Sig Start Date End Date Taking? Authorizing Provider  Memantine HCl-Donepezil HCl (NAMZARIC) 28-10 MG CP24 Take 1 capsule by mouth every evening.   Yes [provider]  amLODipine (NORVASC) 10 MG tablet Take 1 tablet (10 mg total) by mouth daily. 05/29/17   Reed, Tiffany L, DO  aspirin 81 MG tablet Take 81 mg by mouth daily.    [provider]  bisacodyl (DULCOLAX) 10 MG suppository Place 10 mg rectally as needed for moderate constipation.    [provider]  Calcium Carb-Cholecalciferol 600-800 MG-UNIT CHEW Chew by mouth.  [provider]  carbidopa-levodopa (SINEMET IR) 25-100 MG tablet Take 1 tablet by mouth 3 (three) times daily. 08/07/17   [name], MD  carboxymethylcellulose (REFRESH PLUS) 0.5 % SOLN 2 drops as needed.    [provider]  clopidogrel (PLAVIX) 75 MG tablet Take 75 mg by mouth daily.    [provider]  Co-Enzyme Q-10 100 MG CAPS Take 100 mg by mouth daily.    [provider]  escitalopram (LEXAPRO) 10 MG tablet Take 10 mg by mouth daily.    [provider]  Lutein-Zeaxanthin 25-5 MG CAPS Take 1 capsule by mouth daily.    [provider]  Menthol, Topical Analgesic, (BIOFREEZE) 4 % GEL Apply 1 application topically 2 (two) times daily as needed.    [provider]  Misc Natural Products (GLUCOSAMINE CHOND COMPLEX/MSM PO) Take 1 tablet by mouth 3  (three) times daily.     [provider]  Multiple Vitamin (MULTI-VITAMINS) TABS Take by mouth.    [provider]  nebivolol (BYSTOLIC) 5 MG tablet Take 1 tablet (5 mg total) by mouth daily. 04/04/17   Reed, Tiffany L, DO  Omega-3 Fatty Acids (OMEGA 3 PO) Take 1 capsule by mouth 2 (two) times daily.     [provider]  saccharomyces boulardii (FLORASTOR) 250 MG capsule Take 250 mg by mouth 2 (two) times daily.    [provider]  saw palmetto 500 MG capsule Take 500 mg by mouth daily.    [provider]  senna-docusate (SENOKOT-S) 8.6-50 MG tablet Take 2 tablets by mouth at bedtime.    [provider]  triamcinolone (NASACORT) 55 MCG/ACT AERO nasal inhaler Place 2 sprays into the nose at bedtime.    [provider]  vitamin C (ASCORBIC ACID) 500 MG tablet Take 500 mg by mouth daily.    [provider]    ROS:  Out of a complete 14 system review of symptoms, the patient complains only of the following symptoms, and all other reviewed systems are negative.  Constipation  Blood pressure 105/60, pulse 61, height 5\' 9"  (1.753 m), SpO2 97 %.  Physical Exam  General: The patient is alert and cooperative at the time of the examination.  Skin: No significant peripheral edema is noted.   Neurologic Exam  Mental status: The patient is alert and oriented x 3 at the time of the examination. The patient has apparent normal recent and remote memory, with an apparently normal attention span and concentration ability.   Cranial nerves: Facial symmetry is present. Speech is normal, no aphasia or dysarthria is noted. Extraocular movements are full with the exception of some restriction of superior gaze. Visual fields are full.  Motor: The patient has good strength in all 4 extremities.  Increased motor tone is noted in both legs.  Sensory examination: Soft touch sensation is symmetric on the face, arms, and  legs.  Coordination: The patient has good finger-nose-finger and heel-to-shin bilaterally.  Gait and station: The patient is unable to ambulate, he is wheelchair-bound.  Reflexes: Deep tendon reflexes are symmetric.   Assessment/Plan:  1.  Gait disturbance  2.  Parkinsonism  The patient will remain on Sinemet taking 25/100 mg tablets 3 times daily, we will follow-up in 4 months.  We may consider increasing the medication in the future depending upon what his needs are.  The family will contact me if his condition changes.  Jill Alexanders MD 08/27/2017 2:03 PM  [facility] [address] [place], [place] [address]  Phone [phone] Fax [phone]

## 2017-08-31 DIAGNOSIS — R278 Other lack of coordination: Secondary | ICD-10-CM | POA: Diagnosis not present

## 2017-08-31 DIAGNOSIS — I679 Cerebrovascular disease, unspecified: Secondary | ICD-10-CM | POA: Diagnosis not present

## 2017-08-31 DIAGNOSIS — G214 Vascular parkinsonism: Secondary | ICD-10-CM | POA: Diagnosis not present

## 2017-08-31 DIAGNOSIS — M6389 Disorders of muscle in diseases classified elsewhere, multiple sites: Secondary | ICD-10-CM | POA: Diagnosis not present

## 2017-09-03 ENCOUNTER — Encounter: Payer: Self-pay | Admitting: Adult Health

## 2017-09-03 ENCOUNTER — Non-Acute Institutional Stay (SKILLED_NURSING_FACILITY): Payer: Medicare Other | Admitting: Adult Health

## 2017-09-03 DIAGNOSIS — R278 Other lack of coordination: Secondary | ICD-10-CM | POA: Diagnosis not present

## 2017-09-03 DIAGNOSIS — K5901 Slow transit constipation: Secondary | ICD-10-CM

## 2017-09-03 DIAGNOSIS — D649 Anemia, unspecified: Secondary | ICD-10-CM | POA: Diagnosis not present

## 2017-09-03 DIAGNOSIS — F0150 Vascular dementia without behavioral disturbance: Secondary | ICD-10-CM

## 2017-09-03 DIAGNOSIS — I10 Essential (primary) hypertension: Secondary | ICD-10-CM | POA: Diagnosis not present

## 2017-09-03 DIAGNOSIS — K5900 Constipation, unspecified: Secondary | ICD-10-CM | POA: Insufficient documentation

## 2017-09-03 DIAGNOSIS — G309 Alzheimer's disease, unspecified: Secondary | ICD-10-CM | POA: Diagnosis not present

## 2017-09-03 DIAGNOSIS — F0280 Dementia in other diseases classified elsewhere without behavioral disturbance: Secondary | ICD-10-CM | POA: Diagnosis not present

## 2017-09-03 DIAGNOSIS — G214 Vascular parkinsonism: Secondary | ICD-10-CM | POA: Diagnosis not present

## 2017-09-03 DIAGNOSIS — I679 Cerebrovascular disease, unspecified: Secondary | ICD-10-CM

## 2017-09-03 DIAGNOSIS — M6389 Disorders of muscle in diseases classified elsewhere, multiple sites: Secondary | ICD-10-CM | POA: Diagnosis not present

## 2017-09-03 NOTE — Assessment & Plan Note (Signed)
Add HCTZ 12.5 mg qd.  Check BMP in 2 weeks. Continue to monitor BP Continue Bystolic 5 mg qd, Telmisartan 80 mg qd and Norvasc 10 mg qd.

## 2017-09-03 NOTE — Progress Notes (Signed)
Location:  [facility] psychologist of Service:  SNF (31) Provider:   Cindi Carbon, ANP [place] [phone]   [name], DO  Patient Care Team: [name], DO as PCP - General (Geriatric Medicine) Danella Sensing, MD as Consulting Physician (Dermatology) Sharyne Peach, MD as Consulting Physician (Ophthalmology) Irine Seal, MD as Attending Physician (Urology) [name], MD as Consulting Physician (Neurology)  Extended Emergency Contact Information Primary Emergency Contact: Gartner,[name] Address: [address]          [place] [address] [name] of [place] Phone: [phone] Relation: Spouse Secondary Emergency Contact: [place] of [place] Mobile Phone: [phone] Relation: Daughter  Code Status:  DNR Goals of care: Advanced Directive information Advanced Directives 08/21/2017  Does Patient Have a Medical Advance Directive? Yes  Type of Advance Directive Out of facility DNR (pink MOST or yellow form);[place];Living will  Does patient want to make changes to medical advance directive? No - Patient declined  Copy of [place] in Chart? Yes  Would patient like information on creating a medical advance directive? -  Pre-existing out of facility DNR order (yellow form or pink MOST form) Yellow form placed in chart (order not valid for inpatient use);Pink MOST form placed in chart (order not valid for inpatient use)     Chief Complaint  Patient presents with  . Medical Management of Chronic Issues    HPI:  Pt is a 83 y.o. male seen today for medical management of chronic diseases.  He resides in skilled care due to functional and cognitive losses  Vascular dementia: Currently on namzaric  MMSE 18/30 08/21/17  HTN: BP elevated consistently 497-026 systolic, HR 37-85'Y, no edema or chest pain  Seen bu Dr Jannifer Franklin on 11/12 for vascular  parkinsonism: he was taken off sinemet but developed rigidity and was placed back on it which seems to have helped  Constipation: no issues with BMS reported on senna s   Anemia: very mild Hgb 12.5, on plavix  Functional status: stand up lift, incontinent  Past Medical History:  Diagnosis Date  . Abnormality of gait   . Arthritis   . Brain bleed ([place]) 09/01/2016  . Cervical spondylosis   . Cervical spondylosis   . Degenerative joint disease (DJD) of lumbar spine   . Depression   . Diplopia   . Dyslipidemia   . Essential tremor   . Foul smelling urine   . Frequent falls   . GERD (gastroesophageal reflux disease)   . Glycosuria   . Hearing difficulty    hearing aids  . Hyperlipidemia   . Hypertension   . Hyperthyroidism   . Memory loss   . Osteoarthritis   . Radiculopathy, lumbar region   . 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 Small vessel disease   . TIA (transient ischemic attack)    Past Surgical History:  Procedure Laterality Date  . APPENDECTOMY     done as a child  . GALLBLADDER SURGERY  2008  . KNEE ARTHROSCOPY Left    Dr. Hart Robinsons 2002  . knee injections Right    Dr. Adriana Mccallum  . TONSILLECTOMY     done as a child    No Known Allergies  Outpatient Encounter Medications as of 09/03/2017  Medication Sig  . amLODipine (NORVASC) 10 MG tablet Take 1 tablet (10 mg total) by mouth daily.  Marland Kitchen  aspirin 81 MG tablet Take 81 mg by mouth daily.  . Calcium Carb-Cholecalciferol 600-800 MG-UNIT CHEW Chew 1 tablet daily by mouth.   . carbidopa-levodopa (SINEMET IR) 25-100 MG tablet Take 1 tablet by mouth 3 (three) times daily.  . clopidogrel (PLAVIX) 75 MG tablet Take 75 mg by mouth daily.  Marland Kitchen Co-Enzyme Q-10 100 MG CAPS Take 100 mg by mouth daily.  . Memantine HCl-Donepezil HCl (NAMZARIC) 28-10 MG CP24 Take 1 capsule at bedtime by mouth.   . Misc Natural Products (GLUCOSAMINE CHOND COMPLEX/MSM PO) Take 1 tablet by mouth 3 (three) times  daily.   . Omega-3 Fatty Acids (OMEGA 3 PO) Take 1,000 mg 2 (two) times daily by mouth.   . saw palmetto 500 MG capsule Take 500 mg by mouth daily.  Marland Kitchen senna-docusate (SENOKOT-S) 8.6-50 MG tablet Take 2 tablets 2 (two) times daily by mouth.   . triamcinolone (NASACORT) 55 MCG/ACT AERO nasal inhaler Place 2 sprays into the nose at bedtime.  . carboxymethylcellulose (REFRESH PLUS) 0.5 % SOLN Apply 2 drops as needed to eye.   . escitalopram (LEXAPRO) 10 MG tablet Take 10 mg by mouth daily.  . Lutein-Zeaxanthin 25-5 MG CAPS Take 1 capsule by mouth daily.  . Menthol, Topical Analgesic, (BIOFREEZE) 4 % GEL Apply 1 application topically 2 (two) times daily as needed.  . Multiple Vitamin (MULTI-VITAMINS) TABS Take 1 tablet daily by mouth.   . nebivolol (BYSTOLIC) 5 MG tablet Take 1 tablet (5 mg total) by mouth daily.  Marland Kitchen telmisartan (MICARDIS) 80 MG tablet Take 80 mg daily by mouth.  . vitamin C (ASCORBIC ACID) 500 MG tablet Take 500 mg by mouth daily.   No facility-administered encounter medications on file as of 09/03/2017.     Review of Systems  Constitutional: Negative for activity change, appetite change, chills, diaphoresis, fatigue, fever and unexpected weight change.  Respiratory: Negative for cough, shortness of breath, wheezing and stridor.   Cardiovascular: Negative for chest pain, palpitations and leg swelling.  Gastrointestinal: Negative for abdominal distention, abdominal pain, constipation and diarrhea.  Genitourinary: Negative for difficulty urinating and dysuria.  Musculoskeletal: Positive for gait problem. Negative for arthralgias, back pain, joint swelling and myalgias.  Neurological: Positive for weakness. Negative for dizziness, seizures, syncope, facial asymmetry, speech difficulty and headaches.  Hematological: Negative for adenopathy. Does not bruise/bleed easily.  Psychiatric/Behavioral: Positive for confusion. Negative for agitation and behavioral problems.     Immunization History  Administered Date(s) Administered  . Influenza, High Dose Seasonal PF 07/31/2016  . Influenza-Unspecified 08/01/2010, 07/30/2012, 10/16/2012, 10/16/2013  . Pneumococcal Polysaccharide-23 02/23/2004, 02/21/2013  . Pneumococcal-Unspecified 03/17/2016  . Td 12/02/2008  . Tdap 09/01/2016  . Zoster 03/28/2006   Pertinent  Health Maintenance Due  Topic Date Due  . PNA vac Low Risk Adult (2 of 2 - PCV13) 03/17/2017  . INFLUENZA VACCINE  05/16/2017   Fall Risk  08/21/2017 04/04/2017 02/26/2017  Falls in the past year? No No No   Functional Status Survey:    Vitals:   09/03/17 1433  BP: (!) 172/72  Pulse: (!) 56  Weight: 167 lb 3.2 oz (75.8 kg)   Body mass index is 24.69 kg/m.  Wt Readings from Last 3 Encounters:  09/03/17 167 lb 3.2 oz (75.8 kg)  08/21/17 172 lb (78 kg)  08/07/17 172 lb (78 kg)   Physical Exam  Constitutional: No distress.  HENT:  Head: Normocephalic and atraumatic.  Mouth/Throat: No oropharyngeal exudate.  Eyes: Conjunctivae are normal. Pupils are equal, round, and  reactive to light. Right eye exhibits no discharge. Left eye exhibits no discharge.  Neck: Normal range of motion. Neck supple. No JVD present. No tracheal deviation present. No thyromegaly present.  Cardiovascular: Normal rate and regular rhythm.  No murmur heard. No carotid bruit or abd bruit  Pulmonary/Chest: Effort normal and breath sounds normal. No respiratory distress. He has no wheezes.  Abdominal: Soft. Bowel sounds are normal. He exhibits no distension. There is no tenderness.  Lymphadenopathy:    He has no cervical adenopathy.  Neurological: He is alert.  Oriented to self and place, not time. Able to f/c .   Skin: Skin is warm and dry. He is not diaphoretic.  Psychiatric: He has a normal mood and affect.    Labs reviewed: Recent Labs    09/07/16 1402 09/07/16 1844  09/09/16 0548 09/12/16 0212 09/15/16 0555  12/14/16 05/11/17 06/04/17  NA  --   --     < > 137 136 135   < > 137 140 140  K  --   --    < > 3.9 3.4* 3.6   < > 3.9 3.9 4.1  CL  --   --    < > 104 104 100*  --   --   --   --   CO2  --   --    < > 24 25 24   --   --   --   --   GLUCOSE  --   --    < > 191* 129* 138*  --   --   --   --   BUN  --   --    < > 21* 17 15   < > 26* 20 17  CREATININE  --   --    < > 0.97 0.71 0.87   < > 1.1 0.9 0.9  CALCIUM  --   --    < > 8.8* 8.6* 9.1  --   --   --   --   MG 1.8  --   --   --   --   --   --   --   --   --   PHOS  --  3.2  --   --   --   --   --   --   --   --    < > = values in this interval not displayed.   Recent Labs    09/07/16 1300 09/08/16 0603  AST 42* 40  ALT 53 53  ALKPHOS 51 44  BILITOT 0.5 0.5  PROT 5.7* 5.3*  ALBUMIN 2.9* 2.6*   Recent Labs    09/07/16 1300 09/08/16 0603 09/09/16 0548 09/12/16 0212 10/18/16 12/14/16  WBC 12.1* 11.2* 10.9* 10.5 6.4 8.8  NEUTROABS 8.9*  --   --  6.8  --   --   HGB 9.4* 9.1* 9.9* 10.4* 11.3* 12.2*  HCT 27.4* 27.0* 29.1* 31.0* 34* 36*  MCV 90.7 91.2 90.4 91.7  --   --   PLT 255 235 297 359  --  303   Lab Results  Component Value Date   TSH 2.40 08/02/2017   Lab Results  Component Value Date   HGBA1C 6.8 (H) 09/07/2016   Lab Results  Component Value Date   CHOL 127 05/11/2017   HDL 50 05/11/2017   LDLCALC 58 05/11/2017   TRIG 98 05/11/2017    Significant Diagnostic Results in last 30 days:  No  results found.  Assessment/Plan . Essential hypertension Add HCTZ 12.5 mg qd.  Check BMP in 2 weeks. Continue to monitor BP Continue Bystolic 5 mg qd, Telmisartan 80 mg qd and Norvasc 10 mg qd.    Mixed Alzheimer's and vascular dementia Progressive cognitive and functional losses over time, c/w the dz.  Continue Namzaric.  Parkinsonism ([place]) Followed by Dr. Jannifer Franklin. Continue Sinemet  Anemia Check CBC  Cerebrovascular disease Continue Plavix 75 mg qd. Work on BP control.    Constipation Continue Senokot 2 tabs BID   Family/ staff Communication: discussed  with resident and his nurse  Cindi Carbon, [place] [phone]

## 2017-09-03 NOTE — Assessment & Plan Note (Signed)
Continue Plavix 75 mg qd. Work on BP control.

## 2017-09-03 NOTE — Assessment & Plan Note (Signed)
Continue Senokot 2 tabs BID

## 2017-09-03 NOTE — Assessment & Plan Note (Signed)
Progressive cognitive and functional losses over time, c/w the dz.  Continue Namzaric.

## 2017-09-03 NOTE — Assessment & Plan Note (Signed)
Followed by Dr. Jannifer Franklin. Continue Sinemet

## 2017-09-03 NOTE — Assessment & Plan Note (Signed)
Check CBC 

## 2017-09-04 DIAGNOSIS — M6389 Disorders of muscle in diseases classified elsewhere, multiple sites: Secondary | ICD-10-CM | POA: Diagnosis not present

## 2017-09-04 DIAGNOSIS — G214 Vascular parkinsonism: Secondary | ICD-10-CM | POA: Diagnosis not present

## 2017-09-04 DIAGNOSIS — R278 Other lack of coordination: Secondary | ICD-10-CM | POA: Diagnosis not present

## 2017-09-04 DIAGNOSIS — I679 Cerebrovascular disease, unspecified: Secondary | ICD-10-CM | POA: Diagnosis not present

## 2017-09-10 DIAGNOSIS — G214 Vascular parkinsonism: Secondary | ICD-10-CM | POA: Diagnosis not present

## 2017-09-10 DIAGNOSIS — R278 Other lack of coordination: Secondary | ICD-10-CM | POA: Diagnosis not present

## 2017-09-10 DIAGNOSIS — M6389 Disorders of muscle in diseases classified elsewhere, multiple sites: Secondary | ICD-10-CM | POA: Diagnosis not present

## 2017-09-10 DIAGNOSIS — I679 Cerebrovascular disease, unspecified: Secondary | ICD-10-CM | POA: Diagnosis not present

## 2017-09-11 DIAGNOSIS — G214 Vascular parkinsonism: Secondary | ICD-10-CM | POA: Diagnosis not present

## 2017-09-11 DIAGNOSIS — R278 Other lack of coordination: Secondary | ICD-10-CM | POA: Diagnosis not present

## 2017-09-11 DIAGNOSIS — M6389 Disorders of muscle in diseases classified elsewhere, multiple sites: Secondary | ICD-10-CM | POA: Diagnosis not present

## 2017-09-11 DIAGNOSIS — I679 Cerebrovascular disease, unspecified: Secondary | ICD-10-CM | POA: Diagnosis not present

## 2017-09-12 DIAGNOSIS — I679 Cerebrovascular disease, unspecified: Secondary | ICD-10-CM | POA: Diagnosis not present

## 2017-09-12 DIAGNOSIS — G214 Vascular parkinsonism: Secondary | ICD-10-CM | POA: Diagnosis not present

## 2017-09-12 DIAGNOSIS — R278 Other lack of coordination: Secondary | ICD-10-CM | POA: Diagnosis not present

## 2017-09-12 DIAGNOSIS — M6389 Disorders of muscle in diseases classified elsewhere, multiple sites: Secondary | ICD-10-CM | POA: Diagnosis not present

## 2017-09-13 DIAGNOSIS — B351 Tinea unguium: Secondary | ICD-10-CM | POA: Diagnosis not present

## 2017-09-17 DIAGNOSIS — D649 Anemia, unspecified: Secondary | ICD-10-CM | POA: Diagnosis not present

## 2017-09-17 DIAGNOSIS — I10 Essential (primary) hypertension: Secondary | ICD-10-CM | POA: Diagnosis not present

## 2017-09-17 DIAGNOSIS — Z79899 Other long term (current) drug therapy: Secondary | ICD-10-CM | POA: Diagnosis not present

## 2017-09-18 ENCOUNTER — Telehealth: Payer: Self-pay

## 2017-09-18 DIAGNOSIS — R278 Other lack of coordination: Secondary | ICD-10-CM | POA: Diagnosis not present

## 2017-09-18 DIAGNOSIS — M6389 Disorders of muscle in diseases classified elsewhere, multiple sites: Secondary | ICD-10-CM | POA: Diagnosis not present

## 2017-09-18 DIAGNOSIS — I679 Cerebrovascular disease, unspecified: Secondary | ICD-10-CM | POA: Diagnosis not present

## 2017-09-18 DIAGNOSIS — G214 Vascular parkinsonism: Secondary | ICD-10-CM | POA: Diagnosis not present

## 2017-09-18 MED ORDER — CARBIDOPA-LEVODOPA 25-100 MG PO TABS: 1.5000 | ORAL_TABLET | Freq: Three times a day (TID) | ORAL | 3 refills | Status: AC

## 2017-09-18 NOTE — Telephone Encounter (Signed)
Patient's wife says that Dr. Jannifer Franklin told her to call us if she felt like the Sinemet needed to be increased, and she says that she thinks it does. Confirmed pharmacy.

## 2017-09-18 NOTE — Telephone Encounter (Signed)
I called the family.  The patient is on Sinemet taking the 25/100 mg tablet 3 times daily, he is tolerating the medication well without drowsiness or confusion.  He remains somewhat stiff, weak certainly can increase the Sinemet, he will begin taking 1.5 tablets 3 times a day, I will call in a prescription for this.

## 2017-09-18 NOTE — Addendum Note (Signed)
Addended by: [name] on: 09/18/2017 05:01 PM   Modules accepted: Orders

## 2017-09-19 ENCOUNTER — Telehealth: Payer: Self-pay | Admitting: Neurology

## 2017-09-19 NOTE — Telephone Encounter (Signed)
[name] from [facility] is calling regarding the patient and is asking for a call back from Dr Jannifer Franklin' nurse. [phone].

## 2017-09-19 NOTE — Telephone Encounter (Signed)
Called and spoke with [facility]. She wanted to know if adjusments were made to pt rx sinemet 25-100mg  tablet. Advised per CW,MD phone note from 09/18/17 that he increased his dosage to 1.5 tablets TID. She verbalized understanding and asked that they be updated next time with any medication changes.

## 2017-10-04 ENCOUNTER — Encounter: Payer: Self-pay | Admitting: Adult Health

## 2017-10-04 ENCOUNTER — Non-Acute Institutional Stay (SKILLED_NURSING_FACILITY): Payer: Medicare Other | Admitting: Adult Health

## 2017-10-04 DIAGNOSIS — I679 Cerebrovascular disease, unspecified: Secondary | ICD-10-CM | POA: Diagnosis not present

## 2017-10-04 DIAGNOSIS — F0150 Vascular dementia without behavioral disturbance: Secondary | ICD-10-CM

## 2017-10-04 DIAGNOSIS — M1990 Unspecified osteoarthritis, unspecified site: Secondary | ICD-10-CM | POA: Diagnosis not present

## 2017-10-04 DIAGNOSIS — F4321 Adjustment disorder with depressed mood: Secondary | ICD-10-CM

## 2017-10-04 DIAGNOSIS — G309 Alzheimer's disease, unspecified: Secondary | ICD-10-CM | POA: Diagnosis not present

## 2017-10-04 DIAGNOSIS — I10 Essential (primary) hypertension: Secondary | ICD-10-CM

## 2017-10-04 DIAGNOSIS — G214 Vascular parkinsonism: Secondary | ICD-10-CM

## 2017-10-04 DIAGNOSIS — F0280 Dementia in other diseases classified elsewhere without behavioral disturbance: Secondary | ICD-10-CM | POA: Diagnosis not present

## 2017-10-04 NOTE — Assessment & Plan Note (Signed)
Not an issue at this time

## 2017-10-04 NOTE — Assessment & Plan Note (Signed)
Progressive decline in cognition and function. Remains on namzaric.

## 2017-10-04 NOTE — Progress Notes (Signed)
Location:  Occupational psychologist of Service:  SNF (31) Provider:   Cindi Carbon, ANP [place] [phone]   [name], DO  Patient Care Team: [name], DO as PCP - General (Geriatric Medicine) Danella Sensing, MD as Consulting Physician (Dermatology) Sharyne Peach, MD as Consulting Physician (Ophthalmology) Irine Seal, MD as Attending Physician (Urology) [name], MD as Consulting Physician (Neurology)  Extended Emergency Contact Information Primary Emergency Contact: Abreu,[name] Address: [address]           [address] [name] of [place] Phone: [phone] Relation: Spouse Secondary Emergency Contact: [place] of [place] Mobile Phone: [phone] Relation: Daughter  Code Status:  DNR Goals of care: Advanced Directive information Advanced Directives 08/21/2017  Does Patient Have a Medical Advance Directive? Yes  Type of Advance Directive Out of facility DNR (pink MOST or yellow form);[place];Living will  Does patient want to make changes to medical advance directive? No - Patient declined  Copy of [place] in Chart? Yes  Would patient like information on creating a medical advance directive? -  Pre-existing out of facility DNR order (yellow form or pink MOST form) Yellow form placed in chart (order not valid for inpatient use);Pink MOST form placed in chart (order not valid for inpatient use)     Chief Complaint  Patient presents with  . Medical Management of Chronic Issues    HPI:  Pt is a 83 y.o. male seen today for medical management of chronic diseases.  He resides in skilled care due to functional and cognitive losses. He denies any complaints at this time.   Weight loss: has lost 9 lbs since Nov. He was previously on Remeron and gained a significant amt of weight He does not appear depressed and denies decreased  appetite or trouble sleeping. He remains on lexapro for depression.   Vascular dementia: Currently on namzaric  MMSE 18/30 08/21/17  HTN: BP improved with hctz 131/66, follow BMP ok  Parkinsonism: Sinemet increased to 1.5 tabs TID.  Rigidity seems better  Arthritis: hx of pain to the low back, neck and knees. Denies pain at this time. He takes a joint supplement.  He has a hx of cerebrovascular dz and is on aspirin and plavix. Most recent CBC stable.   Functional status: stand up lift, incontinent  Past Medical History:  Diagnosis Date  . Abnormality of gait   . Arthritis   . Brain bleed ([place]) 09/01/2016  . Cervical spondylosis   . Cervical spondylosis   . Degenerative joint disease (DJD) of lumbar spine   . Depression   . Diplopia   . Dyslipidemia   . Essential tremor   . Foul smelling urine   . Frequent falls   . GERD (gastroesophageal reflux disease)   . Glycosuria   . Hearing difficulty    hearing aids  . Hyperlipidemia   . Hypertension   . Hyperthyroidism   . Memory loss   . Osteoarthritis   . Radiculopathy, lumbar region   . 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 Small vessel disease   . TIA (transient ischemic attack)    Past Surgical History:  Procedure Laterality Date  . APPENDECTOMY     done as a child  . GALLBLADDER SURGERY  2008  . KNEE ARTHROSCOPY Left    Dr. Hart Robinsons 2002  . knee injections Right  Dr. Adriana Mccallum  . TONSILLECTOMY     done as a child    No Known Allergies  Outpatient Encounter Medications as of 10/04/2017  Medication Sig  . hydrochlorothiazide (MICROZIDE) 12.5 MG capsule Take 12.5 mg by mouth daily.  Marland Kitchen amLODipine (NORVASC) 10 MG tablet Take 1 tablet (10 mg total) by mouth daily.  Marland Kitchen aspirin 81 MG tablet Take 81 mg by mouth daily.  . Calcium Carb-Cholecalciferol 600-800 MG-UNIT CHEW Chew 1 tablet daily by mouth.   . carbidopa-levodopa (SINEMET IR) 25-100 MG tablet Take 1.5 tablets by mouth 3  (three) times daily.  . carboxymethylcellulose (REFRESH PLUS) 0.5 % SOLN Apply 2 drops as needed to eye.   . clopidogrel (PLAVIX) 75 MG tablet Take 75 mg by mouth daily.  Marland Kitchen Co-Enzyme Q-10 100 MG CAPS Take 100 mg by mouth daily.  Marland Kitchen escitalopram (LEXAPRO) 10 MG tablet Take 10 mg by mouth daily.  . Lutein-Zeaxanthin 25-5 MG CAPS Take 1 capsule by mouth daily.  . Memantine HCl-Donepezil HCl (NAMZARIC) 28-10 MG CP24 Take 1 capsule at bedtime by mouth.   . Menthol, Topical Analgesic, (BIOFREEZE) 4 % GEL Apply 1 application topically 2 (two) times daily as needed.  . Misc Natural Products (GLUCOSAMINE CHOND COMPLEX/MSM PO) Take 1 tablet by mouth 3 (three) times daily.   . Multiple Vitamin (MULTI-VITAMINS) TABS Take 1 tablet daily by mouth.   . nebivolol (BYSTOLIC) 5 MG tablet Take 1 tablet (5 mg total) by mouth daily.  . Omega-3 Fatty Acids (OMEGA 3 PO) Take 1,000 mg 2 (two) times daily by mouth.   . saw palmetto 500 MG capsule Take 500 mg by mouth daily.  Marland Kitchen senna-docusate (SENOKOT-S) 8.6-50 MG tablet Take 2 tablets 2 (two) times daily by mouth.   . telmisartan (MICARDIS) 80 MG tablet Take 80 mg daily by mouth.  . triamcinolone (NASACORT) 55 MCG/ACT AERO nasal inhaler Place 2 sprays into the nose at bedtime.  . vitamin C (ASCORBIC ACID) 500 MG tablet Take 500 mg by mouth daily.   No facility-administered encounter medications on file as of 10/04/2017.     Review of Systems  Constitutional: Negative for activity change, appetite change, chills, diaphoresis, fatigue, fever and unexpected weight change.  Respiratory: Negative for cough, shortness of breath, wheezing and stridor.   Cardiovascular: Negative for chest pain, palpitations and leg swelling.  Gastrointestinal: Positive for constipation. Negative for abdominal distention, abdominal pain and diarrhea.  Genitourinary: Negative for difficulty urinating and dysuria.  Musculoskeletal: Positive for gait problem. Negative for arthralgias, back  pain, joint swelling and myalgias.  Neurological: Positive for weakness. Negative for dizziness, seizures, syncope, facial asymmetry, speech difficulty and headaches.       Rigidity  Hematological: Negative for adenopathy. Does not bruise/bleed easily.  Psychiatric/Behavioral: Positive for confusion. Negative for agitation and behavioral problems.    Immunization History  Administered Date(s) Administered  . Influenza, High Dose Seasonal PF 07/31/2016  . Influenza-Unspecified 08/01/2010, 07/30/2012, 10/16/2012, 10/16/2013, 08/09/2017  . Pneumococcal Polysaccharide-23 02/23/2004, 02/21/2013  . Pneumococcal-Unspecified 03/17/2016  . Td 12/02/2008  . Tdap 09/01/2016  . Zoster 03/28/2006   Pertinent  Health Maintenance Due  Topic Date Due  . INFLUENZA VACCINE  Completed  . PNA vac Low Risk Adult  Completed   Fall Risk  08/21/2017 04/04/2017 02/26/2017  Falls in the past year? No No No   Functional Status Survey:    Vitals:   10/04/17 1457  BP: 131/66  Pulse: (!) 57  Weight: 164 lb 4.8 oz (74.5  kg)   Body mass index is 24.26 kg/m.  Wt Readings from Last 3 Encounters:  10/04/17 164 lb 4.8 oz (74.5 kg)  09/03/17 167 lb 3.2 oz (75.8 kg)  08/21/17 172 lb (78 kg)   Physical Exam  Constitutional: No distress.  HENT:  Head: Normocephalic and atraumatic.  Mouth/Throat: No oropharyngeal exudate.  Eyes: Conjunctivae are normal. Pupils are equal, round, and reactive to light. Right eye exhibits no discharge. Left eye exhibits no discharge.  Neck: Normal range of motion. Neck supple. No JVD present. No tracheal deviation present. No thyromegaly present.  Cardiovascular: Normal rate and regular rhythm.  No murmur heard. No carotid bruit or abd bruit  Pulmonary/Chest: Effort normal and breath sounds normal. No respiratory distress. He has no wheezes.  Abdominal: Soft. Bowel sounds are normal. He exhibits no distension. There is no tenderness.  Musculoskeletal:  Mild rigidity to left  arm and both knees  Lymphadenopathy:    He has no cervical adenopathy.  Neurological: He is alert.  Oriented to self and place, not time. Able to f/c .   Skin: Skin is warm and dry. He is not diaphoretic.  Psychiatric: He has a normal mood and affect.  Nursing note and vitals reviewed.   Labs reviewed: Recent Labs    12/14/16 05/11/17 06/04/17  NA 137 140 140  K 3.9 3.9 4.1  BUN 26* 20 17  CREATININE 1.1 0.9 0.9   No results for input(s): AST, ALT, ALKPHOS, BILITOT, PROT, ALBUMIN in the last 8760 hours. Recent Labs    10/18/16 12/14/16  WBC 6.4 8.8  HGB 11.3* 12.2*  HCT 34* 36*  PLT  --  303   Lab Results  Component Value Date   TSH 2.40 08/02/2017   Lab Results  Component Value Date   HGBA1C 6.8 (H) 09/07/2016   Lab Results  Component Value Date   CHOL 127 05/11/2017   HDL 50 05/11/2017   LDLCALC 58 05/11/2017   TRIG 98 05/11/2017    Significant Diagnostic Results in last 30 days:  No results found.  Assessment/Plan . Essential hypertension Improved, continue hctz, telmisartan, and norvasc. Monitor BP and electrolytes periodically  Cerebrovascular disease No new events. Remains on plavix and aspirin. No longer has lipids drawn due to his age/debility. BP goals <150/90 which is now at goal.  Mixed Alzheimer's and vascular dementia Progressive decline in cognition and function. Remains on namzaric.  Parkinsonism ([place]) Improved rigidity per pt and staff. Followed by Neurology  Arthritis Not an issue at this time.  Situational depression He is losing weight which may be expected at this point as he had previously gained weight. Will continue to monitor. Would not taper lexapro at this time.    Family/ staff Communication: discussed with resident and his nurse  Cindi Carbon, [place] [phone]

## 2017-10-04 NOTE — Assessment & Plan Note (Signed)
Improved rigidity per pt and staff. Followed by Neurology

## 2017-10-04 NOTE — Assessment & Plan Note (Signed)
No new events. Remains on plavix and aspirin. No longer has lipids drawn due to his age/debility. BP goals <150/90 which is now at goal.

## 2017-10-04 NOTE — Assessment & Plan Note (Signed)
He is losing weight which may be expected at this point as he had previously gained weight. Will continue to monitor. Would not taper lexapro at this time.

## 2017-10-04 NOTE — Assessment & Plan Note (Signed)
Improved, continue hctz, telmisartan, and norvasc. Monitor BP and electrolytes periodically

## 2017-10-18 ENCOUNTER — Encounter: Payer: Self-pay | Admitting: Adult Health

## 2017-10-18 ENCOUNTER — Non-Acute Institutional Stay (SKILLED_NURSING_FACILITY): Payer: Medicare Other | Admitting: Adult Health

## 2017-10-18 DIAGNOSIS — M6281 Muscle weakness (generalized): Secondary | ICD-10-CM | POA: Diagnosis not present

## 2017-10-18 DIAGNOSIS — N390 Urinary tract infection, site not specified: Secondary | ICD-10-CM | POA: Diagnosis not present

## 2017-10-18 DIAGNOSIS — R319 Hematuria, unspecified: Secondary | ICD-10-CM | POA: Diagnosis not present

## 2017-10-18 DIAGNOSIS — R338 Other retention of urine: Secondary | ICD-10-CM | POA: Diagnosis not present

## 2017-10-18 DIAGNOSIS — D649 Anemia, unspecified: Secondary | ICD-10-CM | POA: Diagnosis not present

## 2017-10-18 DIAGNOSIS — N3001 Acute cystitis with hematuria: Secondary | ICD-10-CM | POA: Diagnosis not present

## 2017-10-18 DIAGNOSIS — N401 Enlarged prostate with lower urinary tract symptoms: Secondary | ICD-10-CM | POA: Diagnosis not present

## 2017-10-18 DIAGNOSIS — R5381 Other malaise: Secondary | ICD-10-CM | POA: Diagnosis not present

## 2017-10-18 DIAGNOSIS — Z79899 Other long term (current) drug therapy: Secondary | ICD-10-CM | POA: Diagnosis not present

## 2017-10-18 LAB — BASIC METABOLIC PANEL
BUN: 23 — AB (ref 4–21)
Creatinine: 1 (ref 0.6–1.3)
GLUCOSE: 120
Potassium: 3.6 (ref 3.4–5.3)
SODIUM: 145 (ref 137–147)

## 2017-10-18 LAB — CBC AND DIFFERENTIAL
HEMATOCRIT: 39 — AB (ref 41–53)
HEMOGLOBIN: 13.4 — AB (ref 13.5–17.5)
PLATELETS: 291 (ref 150–399)
WBC: 8.2

## 2017-10-18 NOTE — Progress Notes (Signed)
Location:  Occupational psychologist of Service:  SNF (31) Provider:   Cindi Carbon, ANP [place] [phone]   [name], DO  Patient Care Team: [name], DO as PCP - General (Geriatric Medicine) Danella Sensing, MD as Consulting Physician (Dermatology) Sharyne Peach, MD as Consulting Physician (Ophthalmology) Irine Seal, MD as Attending Physician (Urology) [name], MD as Consulting Physician (Neurology)  Extended Emergency Contact Information Primary Emergency Contact: Keshishyan,[name] Address: [address]          [place] [address] [name] of [place] Phone: [phone] Relation: Spouse Secondary Emergency Contact: [place] of [place] Mobile Phone: [phone] Relation: Daughter  Code Status:  DNR Goals of care: Advanced Directive information Advanced Directives 08/21/2017  Does Patient Have a Medical Advance Directive? Yes  Type of Advance Directive Out of facility DNR (pink MOST or yellow form);[place];Living will  Does patient want to make changes to medical advance directive? No - Patient declined  Copy of [place] in Chart? Yes  Would patient like information on creating a medical advance directive? -  Pre-existing out of facility DNR order (yellow form or pink MOST form) Yellow form placed in chart (order not valid for inpatient use);Pink MOST form placed in chart (order not valid for inpatient use)     Chief Complaint  Patient presents with  . Acute Visit    increased confusion, weakness, blood in brief    HPI:  Pt is a 84 y.o. male seen today for an acute visit for confusion, weakness, and blood in his brief. These symptoms were noted 1 day ago.  He is a skilled resident with a hx of vascular dementia and parkinsonism. Due to the above symptoms CBC was ordered which showed normal values. BMP was also ordered with a slight  increase in the BUN to 23. The staff has been encouraging fluid and they have noted urine out put that is rust colored in his brief. He is more alert, stronger and not confused today (from his baseline).  He denies any abd pain, dysuria, back pain, frequency, etc.  A urine was obtained that showed 3+ blood, WBC 50-100, RBC 50-70, 2+ bacteria, and 3+ leukocyte esterase. No nitrites or ketones.   There is a hx of BPH. He has seen Dr. Jeffie Pollock in the past I believe, as noted in epic.  He had another incident similar and was treated for a UTI. Bladder scan done after voiding reveals 208 cc in the bladder.   Past Medical History:  Diagnosis Date  . Abnormality of gait   . Arthritis   . Brain bleed ([place]) 09/01/2016  . Cervical spondylosis   . Cervical spondylosis   . Degenerative joint disease (DJD) of lumbar spine   . Depression   . Diplopia   . Dyslipidemia   . Essential tremor   . Foul smelling urine   . Frequent falls   . GERD (gastroesophageal reflux disease)   . Glycosuria   . Hearing difficulty    hearing aids  . Hyperlipidemia   . Hypertension   . Hyperthyroidism   . Memory loss   . Osteoarthritis   . Radiculopathy, lumbar region   . 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 Small vessel disease   . TIA (transient ischemic attack)    Past Surgical History:  Procedure Laterality Date  . APPENDECTOMY  done as a child  . GALLBLADDER SURGERY  2008  . KNEE ARTHROSCOPY Left    Dr. Hart Robinsons 2002  . knee injections Right    Dr. Adriana Mccallum  . TONSILLECTOMY     done as a child    No Known Allergies  Outpatient Encounter Medications as of 10/18/2017  Medication Sig  . amLODipine (NORVASC) 10 MG tablet Take 1 tablet (10 mg total) by mouth daily.  Marland Kitchen aspirin 81 MG tablet Take 81 mg by mouth daily.  . Calcium Carb-Cholecalciferol 600-800 MG-UNIT CHEW Chew 1 tablet daily by mouth.   . carbidopa-levodopa (SINEMET IR) 25-100 MG tablet Take 1.5 tablets by  mouth 3 (three) times daily.  . carboxymethylcellulose (REFRESH PLUS) 0.5 % SOLN Apply 2 drops as needed to eye.   . clopidogrel (PLAVIX) 75 MG tablet Take 75 mg by mouth daily.  Marland Kitchen Co-Enzyme Q-10 100 MG CAPS Take 100 mg by mouth daily.  Marland Kitchen escitalopram (LEXAPRO) 10 MG tablet Take 10 mg by mouth daily.  . hydrochlorothiazide (MICROZIDE) 12.5 MG capsule Take 12.5 mg by mouth daily.  . Lutein-Zeaxanthin 25-5 MG CAPS Take 1 capsule by mouth daily.  . Memantine HCl-Donepezil HCl (NAMZARIC) 28-10 MG CP24 Take 1 capsule at bedtime by mouth.   . Menthol, Topical Analgesic, (BIOFREEZE) 4 % GEL Apply 1 application topically 2 (two) times daily as needed.  . Misc Natural Products (GLUCOSAMINE CHOND COMPLEX/MSM PO) Take 1 tablet by mouth 3 (three) times daily.   . Multiple Vitamin (MULTI-VITAMINS) TABS Take 1 tablet daily by mouth.   . nebivolol (BYSTOLIC) 5 MG tablet Take 1 tablet (5 mg total) by mouth daily.  . Omega-3 Fatty Acids (OMEGA 3 PO) Take 1,000 mg 2 (two) times daily by mouth.   . saw palmetto 500 MG capsule Take 500 mg by mouth daily.  Marland Kitchen senna-docusate (SENOKOT-S) 8.6-50 MG tablet Take 2 tablets 2 (two) times daily by mouth.   . telmisartan (MICARDIS) 80 MG tablet Take 80 mg daily by mouth.  . triamcinolone (NASACORT) 55 MCG/ACT AERO nasal inhaler Place 2 sprays into the nose at bedtime.  . vitamin C (ASCORBIC ACID) 500 MG tablet Take 500 mg by mouth daily.   No facility-administered encounter medications on file as of 10/18/2017.     Review of Systems  Constitutional: Negative for activity change, appetite change, chills, diaphoresis, fatigue, fever and unexpected weight change.  Respiratory: Negative for cough, shortness of breath, wheezing and stridor.   Cardiovascular: Negative for chest pain, palpitations and leg swelling.  Gastrointestinal: Negative for abdominal distention, abdominal pain, constipation and diarrhea.  Genitourinary: Positive for hematuria. Negative for decreased urine  volume, difficulty urinating, discharge, dysuria, enuresis, flank pain, frequency, genital sores, penile pain, penile swelling, scrotal swelling, testicular pain and urgency.  Musculoskeletal: Positive for gait problem. Negative for arthralgias, back pain, joint swelling and myalgias.  Neurological: Positive for weakness. Negative for dizziness, seizures, syncope, facial asymmetry, speech difficulty and headaches.  Hematological: Negative for adenopathy. Does not bruise/bleed easily.  Psychiatric/Behavioral: Positive for confusion. Negative for agitation and behavioral problems.    Immunization History  Administered Date(s) Administered  . Influenza, High Dose Seasonal PF 07/31/2016  . Influenza-Unspecified 08/01/2010, 07/30/2012, 10/16/2012, 10/16/2013, 08/09/2017  . Pneumococcal Polysaccharide-23 02/23/2004, 02/21/2013  . Pneumococcal-Unspecified 03/17/2016  . Td 12/02/2008  . Tdap 09/01/2016  . Zoster 03/28/2006   Pertinent  Health Maintenance Due  Topic Date Due  . INFLUENZA VACCINE  Completed  . PNA vac Low Risk Adult  Completed  Fall Risk  08/21/2017 04/04/2017 02/26/2017  Falls in the past year? No No No   Functional Status Survey:    Vitals:   10/18/17 1613  BP: (!) 125/48  Pulse: (!) 56  Resp: 16  Temp: 98.5 F (36.9 C)  SpO2: 95%  Weight: 164 lb 4.8 oz (74.5 kg)   Body mass index is 24.26 kg/m. Physical Exam  Constitutional: He is oriented to person, place, and time. No distress.  HENT:  Head: Normocephalic and atraumatic.  Neck: Normal range of motion. Neck supple. No JVD present. No tracheal deviation present. No thyromegaly present.  Cardiovascular: Normal rate and regular rhythm.  No murmur heard. Pulmonary/Chest: Effort normal and breath sounds normal. No respiratory distress. He has no wheezes.  Abdominal: Soft. Bowel sounds are normal. He exhibits no distension. There is no tenderness.  Genitourinary: Penis normal. No penile tenderness.    Lymphadenopathy:    He has no cervical adenopathy.  Neurological: He is alert and oriented to person, place, and time. No cranial nerve deficit.  Skin: Skin is warm and dry. He is not diaphoretic.  Psychiatric: He has a normal mood and affect.  Vitals reviewed.   Labs reviewed: Recent Labs    05/11/17 06/04/17 10/18/17  NA 140 140 145  K 3.9 4.1 3.6  BUN 20 17 23*  CREATININE 0.9 0.9 1.0   No results for input(s): AST, ALT, ALKPHOS, BILITOT, PROT, ALBUMIN in the last 8760 hours. Recent Labs    12/14/16 10/18/17  WBC 8.8 8.2  HGB 12.2* 13.4*  HCT 36* 39*  PLT 303 291   Lab Results  Component Value Date   TSH 2.40 08/02/2017   Lab Results  Component Value Date   HGBA1C 6.8 (H) 09/07/2016   Lab Results  Component Value Date   CHOL 127 05/11/2017   HDL 50 05/11/2017   LDLCALC 58 05/11/2017   TRIG 98 05/11/2017    Significant Diagnostic Results in last 30 days:  No results found.  Assessment/Plan  1. Acute cystitis with hematuria Small amt of blood noted in brief, with slight rust colored urine Hgb stable Start Bactrim DS 1 tab BID for 7 days with florastor 1 bid for 7 days Encourage fluid intake  2. Urinary retention Most likely due to BPH Refer back to urology for recurrent UTI with hematuria, and retention Consider adding flomax and monitoring BP closely (now he is not ambulatory and may tolerate it better) Drain bladder via cath due to retention today  Family/ staff Communication: discussed with nurse, will call his wife  Labs/tests ordered:  UA C and S pending

## 2017-10-25 ENCOUNTER — Telehealth: Payer: Self-pay | Admitting: *Deleted

## 2017-10-25 NOTE — Telephone Encounter (Signed)
Patient wife, [name] called and left message on Clinical Intake stating that she talked with you the other day regarding patient and stated that she was awaiting a call back regarding some concerns she had. She would like to speak with you.

## 2017-10-25 NOTE — Telephone Encounter (Signed)
Kenneth Stark, I seen pt's wife I the hallway at [facility] and she was asking about the cranberry tablets for her husband to take to prevent UTI. Wife states he will take 4 tablets three times daily while he has an infection and take 2 tablets twice daily without infection, please call her with your recommendations [phone]

## 2017-10-26 ENCOUNTER — Encounter: Payer: Self-pay | Admitting: Adult Health

## 2017-10-26 ENCOUNTER — Non-Acute Institutional Stay (SKILLED_NURSING_FACILITY): Payer: Medicare Other | Admitting: Adult Health

## 2017-10-26 DIAGNOSIS — R410 Disorientation, unspecified: Secondary | ICD-10-CM

## 2017-10-26 DIAGNOSIS — D649 Anemia, unspecified: Secondary | ICD-10-CM | POA: Diagnosis not present

## 2017-10-26 DIAGNOSIS — R319 Hematuria, unspecified: Secondary | ICD-10-CM | POA: Diagnosis not present

## 2017-10-26 DIAGNOSIS — R339 Retention of urine, unspecified: Secondary | ICD-10-CM

## 2017-10-26 DIAGNOSIS — G20 Parkinson's disease: Secondary | ICD-10-CM | POA: Diagnosis not present

## 2017-10-26 DIAGNOSIS — F324 Major depressive disorder, single episode, in partial remission: Secondary | ICD-10-CM | POA: Insufficient documentation

## 2017-10-26 DIAGNOSIS — Z79899 Other long term (current) drug therapy: Secondary | ICD-10-CM | POA: Diagnosis not present

## 2017-10-26 DIAGNOSIS — N390 Urinary tract infection, site not specified: Secondary | ICD-10-CM | POA: Diagnosis not present

## 2017-10-26 NOTE — Progress Notes (Signed)
Location:  Occupational psychologist of Service:  SNF (31) Provider:   Cindi Carbon, ANP [place] [phone]   [name], DO  Patient Care Team: [name], DO as PCP - General (Geriatric Medicine) Danella Sensing, MD as Consulting Physician (Dermatology) Sharyne Peach, MD as Consulting Physician (Ophthalmology) Irine Seal, MD as Attending Physician (Urology) [name], MD as Consulting Physician (Neurology)  Extended Emergency Contact Information Primary Emergency Contact: Birchard,[name] Address: [address]          [place] [address] [name] of [place] Phone: [phone] Relation: Spouse Secondary Emergency Contact: [place] of [place] Mobile Phone: [phone] Relation: Daughter  Code Status:  DNR Goals of care: Advanced Directive information Advanced Directives 08/21/2017  Does Patient Have a Medical Advance Directive? Yes  Type of Advance Directive Out of facility DNR (pink MOST or yellow form);[place];Living will  Does patient want to make changes to medical advance directive? No - Patient declined  Copy of [place] in Chart? Yes  Would patient like information on creating a medical advance directive? -  Pre-existing out of facility DNR order (yellow form or pink MOST form) Yellow form placed in chart (order not valid for inpatient use);Pink MOST form placed in chart (order not valid for inpatient use)     Chief Complaint  Patient presents with  . Acute Visit    delirium    HPI:  Pt is a 84 y.o. male seen today for an acute visit for confusion. . These symptoms were noted for the past two days. He has not been sleeping well and is talking to someone that is not there.  The staff report that he his not his usual self and more lethargic.  He is a skilled resident with a hx of vascular dementia and parkinsonism. He was treated for a UTI  with hematuria and confusion on 1/3 Bactrim. When the final urine report was given to me it was noted that the sensitivity to Bactrim was <20. He denies any urinary symptoms and has not had any further hematuria. The nurse has noted that he seemed to be more oriented when treated for the UTI initially but when the antibiotic stopped he became more confused UA C and S 10/18/17 >100,000 colonies of E.Coli sensitive to Bactrim <20  Also of note he has had urinary retention and was placed on Flomax on 1/3. He has an apt to see Dr. Jeffie Pollock later this month.    Past Medical History:  Diagnosis Date  . Abnormality of gait   . Arthritis   . Brain bleed ([place]) 09/01/2016  . Cervical spondylosis   . Cervical spondylosis   . Degenerative joint disease (DJD) of lumbar spine   . Depression   . Diplopia   . Dyslipidemia   . Essential tremor   . Foul smelling urine   . Frequent falls   . GERD (gastroesophageal reflux disease)   . Glycosuria   . Hearing difficulty    hearing aids  . Hyperlipidemia   . Hypertension   . Hyperthyroidism   . Memory loss   . Osteoarthritis   . Radiculopathy, lumbar region   . 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 Small vessel disease   . TIA (transient ischemic attack)    Past Surgical History:  Procedure Laterality Date  . APPENDECTOMY     done  as a child  . GALLBLADDER SURGERY  2008  . KNEE ARTHROSCOPY Left    Dr. Hart Robinsons 2002  . knee injections Right    Dr. Adriana Mccallum  . TONSILLECTOMY     done as a child    No Known Allergies  Outpatient Encounter Medications as of 10/26/2017  Medication Sig  . Cranberry-Vit C-Lactobacillus (RA CRANBERRY SUPPLEMENTS PO) Take 1 capsule by mouth 2 (two) times daily.  . tamsulosin (FLOMAX) 0.4 MG CAPS capsule Take 0.4 mg by mouth at bedtime.  Marland Kitchen amLODipine (NORVASC) 10 MG tablet Take 1 tablet (10 mg total) by mouth daily.  Marland Kitchen aspirin 81 MG tablet Take 81 mg by mouth daily.  . Calcium  Carb-Cholecalciferol 600-800 MG-UNIT CHEW Chew 1 tablet daily by mouth.   . carbidopa-levodopa (SINEMET IR) 25-100 MG tablet Take 1.5 tablets by mouth 3 (three) times daily.  . carboxymethylcellulose (REFRESH PLUS) 0.5 % SOLN Apply 2 drops as needed to eye.   . clopidogrel (PLAVIX) 75 MG tablet Take 75 mg by mouth daily.  Marland Kitchen Co-Enzyme Q-10 100 MG CAPS Take 100 mg by mouth daily.  Marland Kitchen escitalopram (LEXAPRO) 10 MG tablet Take 10 mg by mouth daily.  . hydrochlorothiazide (MICROZIDE) 12.5 MG capsule Take 12.5 mg by mouth daily.  . Lutein-Zeaxanthin 25-5 MG CAPS Take 1 capsule by mouth daily.  . Memantine HCl-Donepezil HCl (NAMZARIC) 28-10 MG CP24 Take 1 capsule at bedtime by mouth.   . Menthol, Topical Analgesic, (BIOFREEZE) 4 % GEL Apply 1 application topically 2 (two) times daily as needed.  . Misc Natural Products (GLUCOSAMINE CHOND COMPLEX/MSM PO) Take 1 tablet by mouth 3 (three) times daily.   . Multiple Vitamin (MULTI-VITAMINS) TABS Take 1 tablet daily by mouth.   . nebivolol (BYSTOLIC) 5 MG tablet Take 1 tablet (5 mg total) by mouth daily.  . Omega-3 Fatty Acids (OMEGA 3 PO) Take 1,000 mg 2 (two) times daily by mouth.   . saw palmetto 500 MG capsule Take 500 mg by mouth daily.  Marland Kitchen senna-docusate (SENOKOT-S) 8.6-50 MG tablet Take 2 tablets 2 (two) times daily by mouth.   . telmisartan (MICARDIS) 80 MG tablet Take 80 mg daily by mouth.  . triamcinolone (NASACORT) 55 MCG/ACT AERO nasal inhaler Place 2 sprays into the nose at bedtime.  . vitamin C (ASCORBIC ACID) 500 MG tablet Take 500 mg by mouth daily.   No facility-administered encounter medications on file as of 10/26/2017.     Review of Systems  Constitutional: Negative for activity change, appetite change, chills, diaphoresis, fatigue, fever and unexpected weight change.  Respiratory: Negative for cough, shortness of breath, wheezing and stridor.   Cardiovascular: Negative for chest pain, palpitations and leg swelling.  Gastrointestinal:  Negative for abdominal distention, abdominal pain, constipation and diarrhea.  Genitourinary: Negative for decreased urine volume, difficulty urinating, discharge, dysuria, enuresis, flank pain, frequency, genital sores, hematuria, penile pain, penile swelling, scrotal swelling, testicular pain and urgency.  Musculoskeletal: Positive for gait problem. Negative for arthralgias, back pain, joint swelling and myalgias.  Neurological: Negative for dizziness, seizures, syncope, facial asymmetry, speech difficulty and headaches.  Hematological: Negative for adenopathy. Does not bruise/bleed easily.  Psychiatric/Behavioral: Positive for confusion, hallucinations and sleep disturbance. Negative for agitation and behavioral problems.    Immunization History  Administered Date(s) Administered  . Influenza, High Dose Seasonal PF 07/31/2016  . Influenza-Unspecified 08/01/2010, 07/30/2012, 10/16/2012, 10/16/2013, 08/09/2017  . Pneumococcal Polysaccharide-23 02/23/2004, 02/21/2013  . Pneumococcal-Unspecified 03/17/2016  . Td 12/02/2008  . Tdap 09/01/2016  .  Zoster 03/28/2006   Pertinent  Health Maintenance Due  Topic Date Due  . INFLUENZA VACCINE  Completed  . PNA vac Low Risk Adult  Completed   Fall Risk  08/21/2017 04/04/2017 02/26/2017  Falls in the past year? No No No   Functional Status Survey:    Vitals:   10/26/17 1133  BP: (!) 114/52  Pulse: 64  Resp: 18  Temp: (!) 96.8 F (36 C)  SpO2: 96%   There is no height or weight on file to calculate BMI. Physical Exam  Constitutional: No distress.  HENT:  Head: Normocephalic and atraumatic.  Nose: Nose normal.  Mouth/Throat: No oropharyngeal exudate.  Eyes: Conjunctivae are normal. Pupils are equal, round, and reactive to light. Right eye exhibits no discharge. Left eye exhibits no discharge.  Neck: No JVD present.  Cardiovascular: Normal rate and regular rhythm.  No murmur heard. Pulmonary/Chest: Effort normal and breath sounds  normal. No respiratory distress. He has no wheezes.  Abdominal: Soft. Bowel sounds are normal. He exhibits no distension. There is no tenderness.  No CVA or suprapubic tenderness  Genitourinary: Penis normal. No penile tenderness.  Lymphadenopathy:    He has no cervical adenopathy.  Neurological: No cranial nerve deficit.  Sleepy but arouses with loud verbal stim; able to follow commands but takes repetitive prompting   Skin: Skin is warm and dry. He is not diaphoretic.  Vitals reviewed.   Labs reviewed: Recent Labs    05/11/17 06/04/17 10/18/17  NA 140 140 145  K 3.9 4.1 3.6  BUN 20 17 23*  CREATININE 0.9 0.9 1.0   No results for input(s): AST, ALT, ALKPHOS, BILITOT, PROT, ALBUMIN in the last 8760 hours. Recent Labs    12/14/16 10/18/17  WBC 8.8 8.2  HGB 12.2* 13.4*  HCT 36* 39*  PLT 303 291   Lab Results  Component Value Date   TSH 2.40 08/02/2017   Lab Results  Component Value Date   HGBA1C 6.8 (H) 09/07/2016   Lab Results  Component Value Date   CHOL 127 05/11/2017   HDL 50 05/11/2017   LDLCALC 58 05/11/2017   TRIG 98 05/11/2017    Significant Diagnostic Results in last 30 days:  No results found.  Assessment/Plan  1) Acute delirium Has not slept well and was recently treated for a UTI. He seems to improve initially cognitively while on Bactrim and the hematuria subsided. He has no urinary symptoms but seems much more confused. CBC, BMP, and UA C and S ordered. Most likely will restart antimicrobial therapy for UTI with Cipro UA is positive as the sensitivity on the Bactrim was <20.  2) Major depression His wife mentioned that he has had feelings of sadness and frustration in regards to living in skilled care and not getting to see his wife frequently. She wonders of depression is a contributor to his cognitive decline. We discussed that we would work on the urinary issues first and then later consider medication changes in regard to this issue.   3)  Parkinsonism It is unclear at this point if he has PSP or parkinsonism (based on the conversation his wife had with the neurologist).  He may be having cognitive decline related to this as well. He is followed by neurology.  4) Urinary retention Staff to check another post void residual and report Continue flomax F/U with urology   Family/ staff Communication: discussed with nurse and his wife [name]  Labs/tests ordered:  UA C and S, CBC, and  BMP

## 2017-10-31 DIAGNOSIS — N200 Calculus of kidney: Secondary | ICD-10-CM | POA: Diagnosis not present

## 2017-10-31 DIAGNOSIS — N401 Enlarged prostate with lower urinary tract symptoms: Secondary | ICD-10-CM | POA: Diagnosis not present

## 2017-10-31 DIAGNOSIS — R3914 Feeling of incomplete bladder emptying: Secondary | ICD-10-CM | POA: Diagnosis not present

## 2017-11-06 DIAGNOSIS — B351 Tinea unguium: Secondary | ICD-10-CM | POA: Diagnosis not present

## 2017-11-27 DIAGNOSIS — R5381 Other malaise: Secondary | ICD-10-CM | POA: Diagnosis not present

## 2017-11-27 DIAGNOSIS — R509 Fever, unspecified: Secondary | ICD-10-CM | POA: Diagnosis not present

## 2017-11-28 ENCOUNTER — Other Ambulatory Visit: Payer: Self-pay

## 2017-11-28 ENCOUNTER — Inpatient Hospital Stay (HOSPITAL_COMMUNITY)
Admission: EM | Admit: 2017-11-28 | Discharge: 2017-11-30 | DRG: 871 | Disposition: A | Discharge status: Skilled Nursing Facility | Payer: Medicare Other | Attending: Internal Medicine | Admitting: Internal Medicine

## 2017-11-28 ENCOUNTER — Encounter (HOSPITAL_COMMUNITY): Payer: Self-pay | Admitting: Emergency Medicine

## 2017-11-28 ENCOUNTER — Inpatient Hospital Stay (HOSPITAL_COMMUNITY): Payer: Medicare Other

## 2017-11-28 ENCOUNTER — Emergency Department (HOSPITAL_COMMUNITY): Payer: Medicare Other

## 2017-11-28 DIAGNOSIS — Z9289 Personal history of other medical treatment: Secondary | ICD-10-CM

## 2017-11-28 DIAGNOSIS — G20 Parkinson's disease: Secondary | ICD-10-CM | POA: Diagnosis not present

## 2017-11-28 DIAGNOSIS — Z7982 Long term (current) use of aspirin: Secondary | ICD-10-CM | POA: Diagnosis not present

## 2017-11-28 DIAGNOSIS — Z515 Encounter for palliative care: Secondary | ICD-10-CM | POA: Diagnosis present

## 2017-11-28 DIAGNOSIS — G9341 Metabolic encephalopathy: Secondary | ICD-10-CM | POA: Diagnosis not present

## 2017-11-28 DIAGNOSIS — M4726 Other spondylosis with radiculopathy, lumbar region: Secondary | ICD-10-CM | POA: Diagnosis not present

## 2017-11-28 DIAGNOSIS — F0280 Dementia in other diseases classified elsewhere without behavioral disturbance: Secondary | ICD-10-CM | POA: Diagnosis present

## 2017-11-28 DIAGNOSIS — E785 Hyperlipidemia, unspecified: Secondary | ICD-10-CM | POA: Diagnosis not present

## 2017-11-28 DIAGNOSIS — M1990 Unspecified osteoarthritis, unspecified site: Secondary | ICD-10-CM | POA: Diagnosis not present

## 2017-11-28 DIAGNOSIS — H9190 Unspecified hearing loss, unspecified ear: Secondary | ICD-10-CM | POA: Diagnosis present

## 2017-11-28 DIAGNOSIS — R509 Fever, unspecified: Secondary | ICD-10-CM | POA: Diagnosis not present

## 2017-11-28 DIAGNOSIS — F329 Major depressive disorder, single episode, unspecified: Secondary | ICD-10-CM | POA: Diagnosis present

## 2017-11-28 DIAGNOSIS — Z79899 Other long term (current) drug therapy: Secondary | ICD-10-CM

## 2017-11-28 DIAGNOSIS — I444 Left anterior fascicular block: Secondary | ICD-10-CM | POA: Diagnosis not present

## 2017-11-28 DIAGNOSIS — J9601 Acute respiratory failure with hypoxia: Secondary | ICD-10-CM | POA: Diagnosis present

## 2017-11-28 DIAGNOSIS — G250 Essential tremor: Secondary | ICD-10-CM | POA: Diagnosis not present

## 2017-11-28 DIAGNOSIS — G20C Parkinsonism, unspecified: Secondary | ICD-10-CM | POA: Diagnosis present

## 2017-11-28 DIAGNOSIS — A419 Sepsis, unspecified organism: Secondary | ICD-10-CM | POA: Diagnosis not present

## 2017-11-28 DIAGNOSIS — J101 Influenza due to other identified influenza virus with other respiratory manifestations: Secondary | ICD-10-CM | POA: Diagnosis not present

## 2017-11-28 DIAGNOSIS — Z4659 Encounter for fitting and adjustment of other gastrointestinal appliance and device: Secondary | ICD-10-CM

## 2017-11-28 DIAGNOSIS — R54 Age-related physical debility: Secondary | ICD-10-CM | POA: Diagnosis not present

## 2017-11-28 DIAGNOSIS — J9691 Respiratory failure, unspecified with hypoxia: Secondary | ICD-10-CM | POA: Diagnosis present

## 2017-11-28 DIAGNOSIS — Z978 Presence of other specified devices: Secondary | ICD-10-CM

## 2017-11-28 DIAGNOSIS — A4189 Other specified sepsis: Principal | ICD-10-CM | POA: Diagnosis present

## 2017-11-28 DIAGNOSIS — K219 Gastro-esophageal reflux disease without esophagitis: Secondary | ICD-10-CM | POA: Diagnosis not present

## 2017-11-28 DIAGNOSIS — M5116 Intervertebral disc disorders with radiculopathy, lumbar region: Secondary | ICD-10-CM | POA: Diagnosis present

## 2017-11-28 DIAGNOSIS — J690 Pneumonitis due to inhalation of food and vomit: Secondary | ICD-10-CM | POA: Diagnosis not present

## 2017-11-28 DIAGNOSIS — R1310 Dysphagia, unspecified: Secondary | ICD-10-CM | POA: Diagnosis present

## 2017-11-28 DIAGNOSIS — Z9181 History of falling: Secondary | ICD-10-CM

## 2017-11-28 DIAGNOSIS — R0602 Shortness of breath: Secondary | ICD-10-CM | POA: Diagnosis not present

## 2017-11-28 DIAGNOSIS — I10 Essential (primary) hypertension: Secondary | ICD-10-CM | POA: Diagnosis present

## 2017-11-28 DIAGNOSIS — Z66 Do not resuscitate: Secondary | ICD-10-CM | POA: Diagnosis not present

## 2017-11-28 DIAGNOSIS — Z7902 Long term (current) use of antithrombotics/antiplatelets: Secondary | ICD-10-CM

## 2017-11-28 DIAGNOSIS — E876 Hypokalemia: Secondary | ICD-10-CM | POA: Diagnosis not present

## 2017-11-28 DIAGNOSIS — Z8673 Personal history of transient ischemic attack (TIA), and cerebral infarction without residual deficits: Secondary | ICD-10-CM | POA: Diagnosis not present

## 2017-11-28 DIAGNOSIS — R918 Other nonspecific abnormal finding of lung field: Secondary | ICD-10-CM | POA: Diagnosis not present

## 2017-11-28 DIAGNOSIS — Z960 Presence of urogenital implants: Secondary | ICD-10-CM

## 2017-11-28 LAB — CBC
HCT: 28.1 % — ABNORMAL LOW (ref 39.0–52.0)
Hemoglobin: 9.7 g/dL — ABNORMAL LOW (ref 13.0–17.0)
MCH: 31.6 pg (ref 26.0–34.0)
MCHC: 34.5 g/dL (ref 30.0–36.0)
MCV: 91.5 fL (ref 78.0–100.0)
Platelets: 199 10*3/uL (ref 150–400)
RBC: 3.07 MIL/uL — AB (ref 4.22–5.81)
RDW: 14.3 % (ref 11.5–15.5)
WBC: 15.4 10*3/uL — AB (ref 4.0–10.5)

## 2017-11-28 LAB — I-STAT CG4 LACTIC ACID, ED
LACTIC ACID, VENOUS: 1.74 mmol/L (ref 0.5–1.9)
LACTIC ACID, VENOUS: 3.1 mmol/L — AB (ref 0.5–1.9)

## 2017-11-28 LAB — URINALYSIS, ROUTINE W REFLEX MICROSCOPIC
Bilirubin Urine: NEGATIVE
Glucose, UA: NEGATIVE mg/dL
Ketones, ur: 5 mg/dL — AB
Nitrite: NEGATIVE
Protein, ur: 30 mg/dL — AB
Specific Gravity, Urine: 1.019 (ref 1.005–1.030)
Squamous Epithelial / HPF: NONE SEEN
pH: 5 (ref 5.0–8.0)

## 2017-11-28 LAB — INFLUENZA PANEL BY PCR (TYPE A & B)
Influenza A By PCR: POSITIVE — AB
Influenza B By PCR: NEGATIVE

## 2017-11-28 LAB — CBC WITH DIFFERENTIAL/PLATELET
BASOS ABS: 0 10*3/uL (ref 0.0–0.1)
BASOS PCT: 0 %
Eosinophils Absolute: 0 10*3/uL (ref 0.0–0.7)
Eosinophils Relative: 0 %
HEMATOCRIT: 34 % — AB (ref 39.0–52.0)
Hemoglobin: 11.5 g/dL — ABNORMAL LOW (ref 13.0–17.0)
LYMPHS PCT: 12 %
Lymphs Abs: 1.8 10*3/uL (ref 0.7–4.0)
MCH: 31.1 pg (ref 26.0–34.0)
MCHC: 33.8 g/dL (ref 30.0–36.0)
MCV: 91.9 fL (ref 78.0–100.0)
Monocytes Absolute: 0.6 10*3/uL (ref 0.1–1.0)
Monocytes Relative: 4 %
NEUTROS ABS: 13.4 10*3/uL — AB (ref 1.7–7.7)
Neutrophils Relative %: 84 %
PLATELETS: 215 10*3/uL (ref 150–400)
RBC: 3.7 MIL/uL — AB (ref 4.22–5.81)
RDW: 14.2 % (ref 11.5–15.5)
WBC: 15.9 10*3/uL — AB (ref 4.0–10.5)

## 2017-11-28 LAB — MRSA PCR SCREENING: MRSA BY PCR: POSITIVE — AB

## 2017-11-28 LAB — COMPREHENSIVE METABOLIC PANEL
ALT: 9 U/L — ABNORMAL LOW (ref 17–63)
ANION GAP: 14 (ref 5–15)
AST: 25 U/L (ref 15–41)
Albumin: 3.1 g/dL — ABNORMAL LOW (ref 3.5–5.0)
Alkaline Phosphatase: 61 U/L (ref 38–126)
BILIRUBIN TOTAL: 0.7 mg/dL (ref 0.3–1.2)
BUN: 25 mg/dL — ABNORMAL HIGH (ref 6–20)
CALCIUM: 9.2 mg/dL (ref 8.9–10.3)
CO2: 24 mmol/L (ref 22–32)
Chloride: 100 mmol/L — ABNORMAL LOW (ref 101–111)
Creatinine, Ser: 1.07 mg/dL (ref 0.61–1.24)
GFR, EST NON AFRICAN AMERICAN: 59 mL/min — AB (ref >60)
Glucose, Bld: 226 mg/dL — ABNORMAL HIGH (ref 65–99)
POTASSIUM: 2.8 mmol/L — AB (ref 3.5–5.1)
Sodium: 138 mmol/L (ref 135–145)
TOTAL PROTEIN: 6.3 g/dL — AB (ref 6.5–8.1)

## 2017-11-28 LAB — BASIC METABOLIC PANEL
ANION GAP: 12 (ref 5–15)
BUN: 23 mg/dL — ABNORMAL HIGH (ref 6–20)
CO2: 22 mmol/L (ref 22–32)
Calcium: 8.3 mg/dL — ABNORMAL LOW (ref 8.9–10.3)
Chloride: 105 mmol/L (ref 101–111)
Creatinine, Ser: 1.04 mg/dL (ref 0.61–1.24)
GFR calc Af Amer: >60 mL/min (ref >60)
GLUCOSE: 252 mg/dL — AB (ref 65–99)
POTASSIUM: 2.9 mmol/L — AB (ref 3.5–5.1)
SODIUM: 139 mmol/L (ref 135–145)

## 2017-11-28 LAB — STREP PNEUMONIAE URINARY ANTIGEN: Strep Pneumo Urinary Antigen: NEGATIVE

## 2017-11-28 LAB — MAGNESIUM: MAGNESIUM: 1.4 mg/dL — AB (ref 1.7–2.4)

## 2017-11-28 MED ORDER — SODIUM CHLORIDE 0.9 % IV SOLN
INTRAVENOUS | Status: DC
  Administered 2017-11-28 (×2): via INTRAVENOUS

## 2017-11-28 MED ORDER — ENOXAPARIN SODIUM 40 MG/0.4ML ~~LOC~~ SOLN
40.0000 mg | SUBCUTANEOUS | Status: DC
  Administered 2017-11-28 – 2017-11-30 (×3): 40 mg via SUBCUTANEOUS
  Filled 2017-11-28 (×3): qty 0.4

## 2017-11-28 MED ORDER — MUPIROCIN 2 % EX OINT
1.0000 "application " | TOPICAL_OINTMENT | Freq: Two times a day (BID) | CUTANEOUS | Status: DC
  Administered 2017-11-28 – 2017-11-30 (×5): 1 via NASAL
  Filled 2017-11-28 (×2): qty 22

## 2017-11-28 MED ORDER — ACETAMINOPHEN 650 MG RE SUPP: 650.0000 mg | Freq: Four times a day (QID) | RECTAL | Status: DC | PRN

## 2017-11-28 MED ORDER — SODIUM CHLORIDE 0.9 % IV SOLN
1.0000 g | Freq: Two times a day (BID) | INTRAVENOUS | Status: DC
  Administered 2017-11-28 – 2017-11-29 (×2): 1 g via INTRAVENOUS
  Filled 2017-11-28 (×3): qty 1

## 2017-11-28 MED ORDER — POLYETHYLENE GLYCOL 3350 17 G PO PACK
17.0000 g | PACK | ORAL | Status: DC
  Administered 2017-11-30: 17 g via ORAL
  Filled 2017-11-28: qty 1

## 2017-11-28 MED ORDER — CLOPIDOGREL BISULFATE 75 MG PO TABS
75.0000 mg | ORAL_TABLET | Freq: Every day | ORAL | Status: DC
  Administered 2017-11-29: 75 mg via ORAL
  Filled 2017-11-28 (×2): qty 1

## 2017-11-28 MED ORDER — ORAL CARE MOUTH RINSE
15.0000 mL | Freq: Two times a day (BID) | OROMUCOSAL | Status: DC
  Administered 2017-11-28 – 2017-11-30 (×5): 15 mL via OROMUCOSAL

## 2017-11-28 MED ORDER — IPRATROPIUM-ALBUTEROL 0.5-2.5 (3) MG/3ML IN SOLN
3.0000 mL | Freq: Four times a day (QID) | RESPIRATORY_TRACT | Status: DC
  Administered 2017-11-28 (×2): 3 mL via RESPIRATORY_TRACT
  Filled 2017-11-28 (×2): qty 3

## 2017-11-28 MED ORDER — OSELTAMIVIR PHOSPHATE 6 MG/ML PO SUSR: 30.0000 mg | Freq: Two times a day (BID) | ORAL | Status: DC

## 2017-11-28 MED ORDER — OSELTAMIVIR PHOSPHATE 6 MG/ML PO SUSR
30.0000 mg | Freq: Two times a day (BID) | ORAL | Status: DC
  Administered 2017-11-28: 30 mg
  Filled 2017-11-28: qty 12.5

## 2017-11-28 MED ORDER — SODIUM CHLORIDE 0.9 % IV BOLUS (SEPSIS)
1000.0000 mL | Freq: Once | INTRAVENOUS | Status: AC
  Administered 2017-11-28: 1000 mL via INTRAVENOUS

## 2017-11-28 MED ORDER — OSELTAMIVIR PHOSPHATE 30 MG PO CAPS
30.0000 mg | ORAL_CAPSULE | Freq: Two times a day (BID) | ORAL | Status: DC
  Administered 2017-11-28 – 2017-11-30 (×4): 30 mg via ORAL
  Filled 2017-11-28 (×5): qty 1

## 2017-11-28 MED ORDER — POLYVINYL ALCOHOL 1.4 % OP SOLN: 2.0000 [drp] | OPHTHALMIC | Status: DC | PRN

## 2017-11-28 MED ORDER — SODIUM CHLORIDE 0.9 % IV SOLN
2.0000 g | Freq: Once | INTRAVENOUS | Status: AC
  Administered 2017-11-28: 2 g via INTRAVENOUS
  Filled 2017-11-28: qty 2

## 2017-11-28 MED ORDER — IPRATROPIUM-ALBUTEROL 0.5-2.5 (3) MG/3ML IN SOLN: 3.0000 mL | RESPIRATORY_TRACT | Status: DC | PRN

## 2017-11-28 MED ORDER — IPRATROPIUM-ALBUTEROL 0.5-2.5 (3) MG/3ML IN SOLN
3.0000 mL | Freq: Three times a day (TID) | RESPIRATORY_TRACT | Status: DC
  Administered 2017-11-28 – 2017-11-30 (×5): 3 mL via RESPIRATORY_TRACT
  Filled 2017-11-28 (×5): qty 3

## 2017-11-28 MED ORDER — CHLORHEXIDINE GLUCONATE 0.12 % MT SOLN
15.0000 mL | Freq: Two times a day (BID) | OROMUCOSAL | Status: DC
  Administered 2017-11-28 – 2017-11-30 (×5): 15 mL via OROMUCOSAL
  Filled 2017-11-28 (×5): qty 15

## 2017-11-28 MED ORDER — DONEPEZIL HCL 10 MG PO TABS
10.0000 mg | ORAL_TABLET | Freq: Every day | ORAL | Status: DC
  Administered 2017-11-29: 10 mg via ORAL
  Filled 2017-11-28: qty 1

## 2017-11-28 MED ORDER — MEMANTINE HCL-DONEPEZIL HCL ER 28-10 MG PO CP24: 1.0000 | ORAL_CAPSULE | Freq: Every day | ORAL | Status: DC

## 2017-11-28 MED ORDER — ONDANSETRON HCL 4 MG PO TABS: 4.0000 mg | ORAL_TABLET | Freq: Four times a day (QID) | ORAL | Status: DC | PRN

## 2017-11-28 MED ORDER — ACETAMINOPHEN 650 MG RE SUPP
650.0000 mg | Freq: Once | RECTAL | Status: AC
  Administered 2017-11-28: 650 mg via RECTAL
  Filled 2017-11-28: qty 1

## 2017-11-28 MED ORDER — POTASSIUM CHLORIDE 10 MEQ/100ML IV SOLN
10.0000 meq | INTRAVENOUS | Status: AC
  Administered 2017-11-28 (×2): 10 meq via INTRAVENOUS
  Filled 2017-11-28 (×4): qty 100

## 2017-11-28 MED ORDER — ONDANSETRON HCL 4 MG/2ML IJ SOLN: 4.0000 mg | Freq: Four times a day (QID) | INTRAMUSCULAR | Status: DC | PRN

## 2017-11-28 MED ORDER — OSELTAMIVIR PHOSPHATE 30 MG PO CAPS
30.0000 mg | ORAL_CAPSULE | Freq: Two times a day (BID) | ORAL | Status: DC
  Filled 2017-11-28: qty 1

## 2017-11-28 MED ORDER — VANCOMYCIN HCL IN DEXTROSE 1-5 GM/200ML-% IV SOLN
1000.0000 mg | INTRAVENOUS | Status: DC
  Administered 2017-11-29: 1000 mg via INTRAVENOUS
  Filled 2017-11-28: qty 200

## 2017-11-28 MED ORDER — MEMANTINE HCL ER 28 MG PO CP24
28.0000 mg | ORAL_CAPSULE | Freq: Every day | ORAL | Status: DC
Start: 2017-11-28 — End: 2017-11-30
  Administered 2017-11-29: 28 mg via ORAL
  Filled 2017-11-28 (×2): qty 1

## 2017-11-28 MED ORDER — VANCOMYCIN HCL IN DEXTROSE 1-5 GM/200ML-% IV SOLN
1000.0000 mg | Freq: Once | INTRAVENOUS | Status: AC
  Administered 2017-11-28: 1000 mg via INTRAVENOUS
  Filled 2017-11-28: qty 200

## 2017-11-28 MED ORDER — ESCITALOPRAM OXALATE 10 MG PO TABS
10.0000 mg | ORAL_TABLET | Freq: Every day | ORAL | Status: DC
  Administered 2017-11-28 – 2017-11-30 (×3): 10 mg via ORAL
  Filled 2017-11-28 (×3): qty 1

## 2017-11-28 MED ORDER — POTASSIUM CHLORIDE 10 MEQ/100ML IV SOLN
10.0000 meq | INTRAVENOUS | Status: AC
  Administered 2017-11-28 (×3): 10 meq via INTRAVENOUS
  Filled 2017-11-28 (×3): qty 100

## 2017-11-28 MED ORDER — MAGNESIUM SULFATE 2 GM/50ML IV SOLN
2.0000 g | Freq: Once | INTRAVENOUS | Status: AC
  Administered 2017-11-28: 2 g via INTRAVENOUS
  Filled 2017-11-28: qty 50

## 2017-11-28 MED ORDER — CARBIDOPA-LEVODOPA 25-100 MG PO TABS
1.5000 | ORAL_TABLET | Freq: Three times a day (TID) | ORAL | Status: DC
  Administered 2017-11-28 – 2017-11-30 (×5): 1.5 via ORAL
  Filled 2017-11-28 (×5): qty 2

## 2017-11-28 MED ORDER — ASPIRIN 81 MG PO CHEW
81.0000 mg | CHEWABLE_TABLET | Freq: Every day | ORAL | Status: DC
  Administered 2017-11-28 – 2017-11-30 (×3): 81 mg via ORAL
  Filled 2017-11-28 (×3): qty 1

## 2017-11-28 MED ORDER — POTASSIUM CHLORIDE 10 MEQ/100ML IV SOLN
10.0000 meq | INTRAVENOUS | Status: AC
  Administered 2017-11-28 (×2): 10 meq via INTRAVENOUS
  Filled 2017-11-28 (×2): qty 100

## 2017-11-28 MED ORDER — CHLORHEXIDINE GLUCONATE CLOTH 2 % EX PADS
6.0000 | MEDICATED_PAD | Freq: Every day | CUTANEOUS | Status: DC
  Administered 2017-11-28 – 2017-11-30 (×3): 6 via TOPICAL

## 2017-11-28 MED ORDER — TRIAMCINOLONE ACETONIDE 55 MCG/ACT NA AERO
2.0000 | INHALATION_SPRAY | Freq: Every day | NASAL | Status: DC
  Administered 2017-11-28 – 2017-11-29 (×2): 2 via NASAL
  Filled 2017-11-28: qty 21.6

## 2017-11-28 MED ORDER — ACETAMINOPHEN 325 MG PO TABS
650.0000 mg | ORAL_TABLET | Freq: Four times a day (QID) | ORAL | Status: DC | PRN
  Administered 2017-11-29: 650 mg via ORAL
  Filled 2017-11-28: qty 2

## 2017-11-28 NOTE — Progress Notes (Signed)
Pharmacy Antibiotic Note  Kenneth Stark is a 84 y.o. male admitted on 11/28/2017 with pneumonia.  Pharmacy has been consulted for Vancomycin, cefepime dosing.  Plan: Cefepime 2gm iv x1, then 1gm iv q12hr  Vancomycin 1gm iv x1, then 1gm iv q24hr  Goal AUC = 400 - 500 for all indications, except meningitis (goal AUC > 500 and Cmin 15-20 mcg/mL)   Height: 5\' 8"  (172.7 cm) Weight: 164 lb 10.9 oz (74.7 kg) IBW/kg (Calculated) : 68.4  Temp (24hrs), Avg:101.1 F (38.4 C), Min:98 F (36.7 C), Max:103.7 F (39.8 C)  Recent Labs  Lab 11/28/17 0026 11/28/17 0052 11/28/17 0332  WBC 15.9*  --   --   CREATININE 1.07  --   --   LATICACIDVEN  --  3.10* 1.74    Estimated Creatinine Clearance: 44.4 mL/min (by C-G formula based on SCr of 1.07 mg/dL).    No Known Allergies  Antimicrobials this admission: Vancomycin 11/28/2017 >> Cefepime 11/28/2017 >>   Dose adjustments this admission: -  Microbiology results: pending  Thank you for allowing pharmacy to be a part of this patient's care.  Kenneth Stark 11/28/2017 5:14 AM

## 2017-11-28 NOTE — ED Notes (Signed)
Bed: WA21 Expected date:  Expected time:  Means of arrival:  Comments: EMS 84 yo male SOB/fever

## 2017-11-28 NOTE — Progress Notes (Signed)
Nts pt per md order. Pt had copious tan secretions. Breath sounds rhonchi. Pt tolerated suctioning well.

## 2017-11-28 NOTE — Progress Notes (Signed)
PHARMACY NOTE:  ANTIMICROBIAL RENAL DOSAGE ADJUSTMENT  Current antimicrobial regimen includes a mismatch between antimicrobial dosage and estimated renal function.  As per policy approved by the Pharmacy & Therapeutics and Medical Executive Committees, the antimicrobial dosage will be adjusted accordingly.  Current antimicrobial dosage:  Oseltamivir 75mg  po bid for 10 total doses  Indication: Influenza   Renal Function:  Estimated Creatinine Clearance: 44.4 mL/min (by C-G formula based on SCr of 1.07 mg/dL). []      On intermittent HD, scheduled: []      On CRRT    Antimicrobial dosage has been changed to:  Oseltamivir 30mg  po bid for 10 total doses   Additional comments:   Thank you for allowing pharmacy to be a part of this patient's care.  [name], [facility] 11/28/2017 2:39 AM

## 2017-11-28 NOTE — ED Notes (Signed)
Admitting physician at bedside at this time.

## 2017-11-28 NOTE — ED Triage Notes (Signed)
Patient arrives from [facility] with complaints of SOB/flu symptoms-fever all day-last Tylenol 1630 at [facility]. Initial pulse ox 84%-patient placed on NRB by [facility]. Patient given Albuterol 10 mg and Atrovent 0.5 mg by EMS. Confirmed flu at facility. Patient did have flu vaccinate this year. O2 sat increased to 98% when on albuterol neb. Patient is DNR. Patient has Parkinsons and has generalized tremors.

## 2017-11-28 NOTE — ED Provider Notes (Addendum)
[facility] DEPT Provider Note: Georgena Spurling, MD, FACEP  CSN: [id number] MRN: [id number] ARRIVAL: 11/28/17 at [place]: [place]; Flu symptoms; and Fever  Level 5 caveat: Dementia; altered mental status HISTORY OF PRESENT ILLNESS  11/28/17 12:43 AM Kenneth Stark is a 84 y.o. male with history of Parkinson's disease and dementia.  He has had a cough for several days.  The cough acutely worsened yesterday afternoon with associated shortness of breath.  He had a fever of 101 at his nursing facility which transiently improved with acetaminophen at 4:30 PM.  He was noted to have hypoxia with an oxygen saturation of 84% on room air.  This improved with supplemental oxygen.  EMS was called and he was administered albuterol 10 mg and Atrovent 0.5 mg.  A flu swab is reportedly pending.  His oxygen saturation improved to 98% after oxygen and neb treatment.  He has a baseline tremor but this is worse with this acute illness.  On arrival he was noted to have a temperature of 103.7.  He was incontinent of stool on arrival.   Past Medical History:  Diagnosis Date  . Abnormality of gait   . Arthritis   . Brain bleed ([place]) 09/01/2016  . Cervical spondylosis   . Degenerative joint disease (DJD) of lumbar spine   . Depression   . Diplopia   . Dyslipidemia   . Essential tremor   . Foul smelling urine   . Frequent falls   . GERD (gastroesophageal reflux disease)   . Glycosuria   . Hearing difficulty    hearing aids  . Hyperlipidemia   . Hypertension   . Hyperthyroidism   . Memory loss   . Osteoarthritis   . Radiculopathy, lumbar region   . 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 Small vessel disease   . TIA (transient ischemic attack)     Past Surgical History:  Procedure Laterality Date  . APPENDECTOMY     done as a child  . GALLBLADDER SURGERY  2008  . KNEE ARTHROSCOPY Left    Dr. Hart Robinsons 2002  . knee  injections Right    Dr. Adriana Mccallum  . TONSILLECTOMY     done as a child    Family History  Problem Relation Age of Onset  . Stroke Mother   . Dementia Mother   . Stroke Father   . Heart disease Father   . Diabetes Father   . Dementia Brother   . Renal Disease Brother   . Diabetes Brother   . Renal Disease Daughter     Social History   Tobacco Use  . Smoking status: Never Smoker  . Smokeless tobacco: Never Used  Substance Use Topics  . Alcohol use: Yes    Comment: Wife occass/rare  . Drug use: No    Prior to Admission medications   Medication Sig Start Date End Date Taking? Authorizing Provider  acetaminophen (TYLENOL) 325 MG tablet Take 650 mg by mouth every 4 (four) hours as needed for mild pain.   Yes [provider]  amLODipine (NORVASC) 10 MG tablet Take 1 tablet (10 mg total) by mouth daily. 05/29/17  Yes Reed, Tiffany L, DO  aspirin 81 MG tablet Take 81 mg by mouth daily.   Yes [provider]  Calcium Carb-Cholecalciferol 600-800 MG-UNIT CHEW Chew 1 tablet by mouth 2 (two) times daily.  Yes [provider]  carbidopa-levodopa (SINEMET IR) 25-100 MG tablet Take 1.5 tablets by mouth 3 (three) times daily. 09/18/17  Yes [name], MD  carboxymethylcellulose (REFRESH PLUS) 0.5 % SOLN Apply 2 drops to eye as needed (dry eyes).    Yes [provider]  clopidogrel (PLAVIX) 75 MG tablet Take 75 mg by mouth daily.   Yes [provider]  Co-Enzyme Q-10 100 MG CAPS Take 100 mg by mouth daily.   Yes [provider]  Cranberry-Vit C-Lactobacillus (RA CRANBERRY SUPPLEMENTS PO) Take 1 capsule by mouth 2 (two) times daily.   Yes [provider]  escitalopram (LEXAPRO) 10 MG tablet Take 10 mg by mouth daily.   Yes [provider]  guaifenesin (ROBITUSSIN) 100 MG/5ML syrup Take 200 mg by mouth every 6 (six) hours as needed for cough.   Yes [provider]  hydrochlorothiazide (MICROZIDE) 12.5 MG  capsule Take 12.5 mg by mouth daily.   Yes [provider]  Lutein-Zeaxanthin 25-5 MG CAPS Take 1 capsule by mouth daily.   Yes [provider]  Memantine HCl-Donepezil HCl (NAMZARIC) 28-10 MG CP24 Take 1 capsule at bedtime by mouth.    Yes [provider]  Menthol, Topical Analgesic, (BIOFREEZE) 4 % GEL Apply 1 application topically 2 (two) times daily as needed (pain).    Yes [provider]  Misc Natural Products (GLUCOSAMINE CHOND COMPLEX/MSM PO) Take 1 tablet by mouth 3 (three) times daily.    Yes [provider]  Multiple Vitamin (MULTI-VITAMINS) TABS Take 1 tablet daily by mouth.    Yes [provider]  nebivolol (BYSTOLIC) 5 MG tablet Take 1 tablet (5 mg total) by mouth daily. 04/04/17  Yes Reed, Tiffany L, DO  Omega-3 Fatty Acids (OMEGA 3 PO) Take 1,000 mg 2 (two) times daily by mouth.    Yes [provider]  polyethylene glycol (MIRALAX / GLYCOLAX) packet Take 17 g by mouth every other day.   Yes [provider]  saw palmetto 500 MG capsule Take 500 mg by mouth daily.   Yes [provider]  senna-docusate (SENOKOT-S) 8.6-50 MG tablet Take 2 tablets 2 (two) times daily by mouth.    Yes [provider]  telmisartan (MICARDIS) 80 MG tablet Take 80 mg daily by mouth.   Yes [provider]  triamcinolone (NASACORT) 55 MCG/ACT AERO nasal inhaler Place 2 sprays into the nose at bedtime.   Yes [provider]  vitamin C (ASCORBIC ACID) 500 MG tablet Take 500 mg by mouth daily.   Yes [provider]    Allergies Patient has no known allergies.   REVIEW OF SYSTEMS     PHYSICAL EXAMINATION  Initial Vital Signs Blood pressure (!) 143/64, pulse (!) 102, temperature (!) 103.7 F (39.8 C), temperature source Rectal, resp. rate 15, SpO2 98 %.  Examination General: Well-developed, well-nourished male; appearance consistent with age of record HENT: normocephalic;  atraumatic Eyes: pupils pinpoint; extraocular muscles cannot be assessed Neck: supple Heart: regular rate and rhythm; Lungs: Grunting respirations; lungs clear to auscultation bilaterally Abdomen: soft; nondistended; nontender; bowel sounds present Extremities: No deformity; full range of motion; pulses normal Neurologic: Does not respond to voice or noxious stimuli; resting tremor noted Skin: Warm and dry   RESULTS  Summary of this visit's results, reviewed by myself:   EKG Interpretation  Date/Time:  Wednesday November 28 2017 00:16:45 EST Ventricular Rate:  97 PR Interval:    QRS Duration: 162 QT Interval:  460 QTC Calculation: 585 R Axis:   -99 Text Interpretation:  Sinus rhythm RBBB and LAFB Rate is faster Confirmed by Laderrick Wilk [phone]) on 11/28/2017 12:21:18 AM      Laboratory Studies: Results for orders placed or performed during the hospital encounter of 11/28/17 (from the past 24 hour(s))  Comprehensive metabolic panel     Status: Abnormal   Collection Time: 11/28/17 12:26 AM  Result Value Ref Range   Sodium 138 135 - 145 mmol/L   Potassium 2.8 (L) 3.5 - 5.1 mmol/L   Chloride 100 (L) 101 - 111 mmol/L   CO2 24 22 - 32 mmol/L   Glucose, Bld 226 (H) 65 - 99 mg/dL   BUN 25 (H) 6 - 20 mg/dL   Creatinine, Ser 1.07 0.61 - 1.24 mg/dL   Calcium 9.2 8.9 - 10.3 mg/dL   Total Protein 6.3 (L) 6.5 - 8.1 g/dL   Albumin 3.1 (L) 3.5 - 5.0 g/dL   AST 25 15 - 41 U/L   ALT 9 (L) 17 - 63 U/L   Alkaline Phosphatase 61 38 - 126 U/L   Total Bilirubin 0.7 0.3 - 1.2 mg/dL   GFR calc non Af Amer 59 (L) >60 mL/min   GFR calc Af Amer >60 >60 mL/min   Anion gap 14 5 - 15  CBC WITH DIFFERENTIAL     Status: Abnormal   Collection Time: 11/28/17 12:26 AM  Result Value Ref Range   WBC 15.9 (H) 4.0 - 10.5 K/uL   RBC 3.70 (L) 4.22 - 5.81 MIL/uL   Hemoglobin 11.5 (L) 13.0 - 17.0 g/dL   HCT 34.0 (L) 39.0 - 52.0 %   MCV 91.9 78.0 - 100.0 fL   MCH 31.1 26.0 - 34.0 pg   MCHC 33.8 30.0 -  36.0 g/dL   RDW 14.2 11.5 - 15.5 %   Platelets 215 150 - 400 K/uL   Neutrophils Relative % 84 %   Neutro Abs 13.4 (H) 1.7 - 7.7 K/uL   Lymphocytes Relative 12 %   Lymphs Abs 1.8 0.7 - 4.0 K/uL   Monocytes Relative 4 %   Monocytes Absolute 0.6 0.1 - 1.0 K/uL   Eosinophils Relative 0 %   Eosinophils Absolute 0.0 0.0 - 0.7 K/uL   Basophils Relative 0 %   Basophils Absolute 0.0 0.0 - 0.1 K/uL  I-Stat CG4 Lactic Acid, ED  (not at  [facility])     Status: Abnormal   Collection Time: 11/28/17 12:52 AM  Result Value Ref Range   Lactic Acid, Venous 3.10 (HH) 0.5 - 1.9 mmol/L   Comment NOTIFIED PHYSICIAN   Influenza panel by PCR (type A & B)     Status: Abnormal   Collection Time: 11/28/17  1:12 AM  Result Value Ref Range   Influenza A By PCR POSITIVE (A) NEGATIVE   Influenza B By PCR NEGATIVE NEGATIVE   Imaging Studies: Dg Chest Port 1 View  Result Date: 11/28/2017 CLINICAL DATA:  Fever to 103.  Unresponsive.  Flu like symptoms. EXAM: PORTABLE CHEST 1 VIEW COMPARISON:  09/08/2016 FINDINGS: Low lung volumes with crowding of interstitial lung markings. No overt pulmonary edema, effusion or pneumothorax. No pulmonary consolidations. Normal size heart. Moderate aortic atherosclerosis along the transverse portion. No aneurysm. Chronic remote right-sided rib fractures involving the right seventh and eighth ribs with angulated deformity. Right upper quadrant surgical clips presumably from prior cholecystectomy. Osteoarthritis of the left glenohumeral joint with spurring off the humeral head and glenoid. IMPRESSION: Low lung  volumes with crowding of interstitial lung markings. No active pulmonary disease. Aortic atherosclerosis. Electronically Signed   By: Ashley Royalty M.D.   On: 11/28/2017 01:12    ED COURSE  Nursing notes and initial vitals signs, including pulse oximetry, reviewed.  Vitals:   11/28/17 0016 11/28/17 0029 11/28/17 0102 11/28/17 0248  BP: (!) 143/64   (!) 117/51  Pulse: (!) 102   79   Resp: 15   18  Temp:  (!) 103.7 F (39.8 C)    TempSrc:  Rectal    SpO2: 98%   99%  Weight:   79.4 kg (175 lb)   Height:   5\' 8"  (1.727 m)    12:55 AM Sepsis protocol initiated.  Lactic acid 3.1, IV fluid bolus initiated.   1:19 AM Vancomycin and cefepime ordered for possible early healthcare associated pneumonia.  Patient is in a skilled nursing facility putting him at risk for HCAP. Influenza test pending.  2:07 AM Dr. Tamala Julian to admit to hospitalist service.  2:56 AM Patient tested positive for influenza A.  PROCEDURES   CRITICAL CARE Performed by: Shanon Rosser L Total critical care time: 30 minutes Critical care time was exclusive of separately billable procedures and treating other patients. Critical care was necessary to treat or prevent imminent or life-threatening deterioration. Critical care was time spent personally by me on the following activities: development of treatment plan with patient and/or surrogate as well as nursing, discussions with consultants, evaluation of patient's response to treatment, examination of patient, obtaining history from patient or surrogate, ordering and performing treatments and interventions, ordering and review of laboratory studies, ordering and review of radiographic studies, pulse oximetry and re-evaluation of patient's condition.   ED DIAGNOSES     ICD-10-CM   1. Influenza A J10.1   2. Hypokalemia E87.6        Mareli Antunes, Jenny Reichmann, MD 11/28/17 0207    Shanon Rosser, MD 11/28/17 0255    Shanon Rosser, MD 11/28/17 [phone]

## 2017-11-28 NOTE — Evaluation (Signed)
Clinical/Bedside Swallow Evaluation Patient Details  Name: Kenneth Stark MRN: [id number] Date of Birth: 04-19-1925  Today's Date: 11/28/2017 Time: SLP Start Time (ACUTE ONLY): 57 SLP Stop Time (ACUTE ONLY): 1155 SLP Time Calculation (min) (ACUTE ONLY): 25 min  Past Medical History:  Past Medical History:  Diagnosis Date  . Abnormality of gait   . Arthritis   . Brain bleed ([place]) 09/01/2016  . Cervical spondylosis   . Degenerative joint disease (DJD) of lumbar spine   . Depression   . Diplopia   . Dyslipidemia   . Essential tremor   . Foul smelling urine   . Frequent falls   . GERD (gastroesophageal reflux disease)   . Glycosuria   . Hearing difficulty    hearing aids  . Hyperlipidemia   . Hypertension   . Hyperthyroidism   . Memory loss   . Osteoarthritis   . Radiculopathy, lumbar region   . 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 Small vessel disease   . TIA (transient ischemic attack)    Past Surgical History:  Past Surgical History:  Procedure Laterality Date  . APPENDECTOMY     done as a child  . GALLBLADDER SURGERY  2008  . KNEE ARTHROSCOPY Left    Dr. Hart Robinsons 2002  . knee injections Right    Dr. Adriana Mccallum  . TONSILLECTOMY     done as a child   HPI:  84 year old male admitted 11/28/17 with flu, sepsis. PMH significant for hearing loss, HTN, GERD, memory loss.  BSE ordered to evaluate swallow safety and identify least restrictive diet.   Assessment / Plan / Recommendation Clinical Impression  Pt presents with generalized weakness due to current illness. No history of swallowing difficulty per wife. Pt appeared to tolerate all po presentations without overt s/s aspiration. Discomfort evident with dry cracker, likely due to sore throat. Will begin Dys 2 diet and thin liquids, primarily for energy conservation. Anticipate being able to advance diet with improved strength and endurance. Safe swalllow precautions posted at [facility]  and reviewed with wife. ST to follow for assessment of diet tolerance and education.    SLP Visit Diagnosis: Dysphagia, unspecified (R13.10)    Aspiration Risk  Mild aspiration risk    Diet Recommendation Dysphagia 2 (Fine chop);Thin liquid   Liquid Administration via: Cup;Straw Medication Administration: Whole meds with puree Supervision: Patient able to self feed;Staff to assist with self feeding;Full supervision/cueing for compensatory strategies Compensations: Slow rate;Minimize environmental distractions Postural Changes: Seated upright at 90 degrees Remain upright 30 minutes after meals   Other  Recommendations Oral Care Recommendations: Oral care BID   Follow up Recommendations None      Frequency and Duration min 1 x/week  1 week;2 weeks       Prognosis Prognosis for Safe Diet Advancement: Good      Swallow Study   General Date of Onset: 11/28/17 HPI: 84 year old male admitted 11/28/17 with flu, sepsis. PMH significant for BSE ordered to evaluate swallow safety and identify least restrictive diet. Type of Study: Bedside Swallow Evaluation Previous Swallow Assessment: no prior ST intervention Diet Prior to this Study: NPO Temperature Spikes Noted: Yes(101.7 on admit) Respiratory Status: Nasal cannula Behavior/Cognition: Alert;Cooperative(HOH) Oral Cavity Assessment: Within Functional Limits Oral Care Completed by SLP: Recent completion by staff Oral Cavity - Dentition: Adequate natural dentition Vision: Functional for self-feeding Self-Feeding Abilities: Needs assist Patient Positioning: Upright in bed Baseline Vocal  Quality: Low vocal intensity Volitional Cough: Weak Volitional Swallow: Able to elicit    Oral/Motor/Sensory Function Overall Oral Motor/Sensory Function: Within functional limits   Ice Chips Ice chips: Within functional limits Presentation: Spoon   Thin Liquid Thin Liquid: Not tested    Nectar Thick Nectar Thick Liquid: Not tested   Honey  Thick Honey Thick Liquid: Not tested   Puree Puree: Within functional limits Presentation: Spoon   Solid   GO   Presentation: Spoon Other Comments: difficulty with dry cracker - grimace with swallow, likely due to sore throat.       Demontre Padin B. Quentin Ore, [facility], [place] Speech Language Pathologist [phone]  Shonna Chock 11/28/2017,11:58 AM

## 2017-11-28 NOTE — Progress Notes (Signed)
Pt was seen by speech therapy, was able to tolerate a DYS 2 diet. He's no longer NPO and is now on dys 2. To feed only when alert and in small bites and to stay upright 30 minutes after meals/taking medications. Pt was already pulling on NG tube so took NG Tube out, MD is aware and says to keep out now since pt only had it in for him to get his tamiflu when he was NPO. Will continue to monitor patient.

## 2017-11-28 NOTE — Progress Notes (Signed)
A consult was received from an ED physician for Vancomycin per pharmacy dosing.  The patient's profile has been reviewed for ht/wt/allergies/indication/available labs.   A one time order has been placed for Vancomycin 1gm iv x1.  Further antibiotics/pharmacy consults should be ordered by admitting physician if indicated.                       Thank you, [name] 11/28/2017  1:30 AM

## 2017-11-28 NOTE — H&P (Signed)
History and Physical    Kenneth Stark ZGY:[id number] DOB: January 23, 1925 DOA: 11/28/2017  Referring MD/NP/PA: Dr. Marisue Humble PCP: [name], DO  Patient coming from: [facility]  Chief Complaint: Shortness of breath  I have personally briefly reviewed patient's old medical records in [place]   HPI: Kenneth Stark is a 84 y.o. male with medical history significant of HTN, HLD, Parkinson's disease with dementia, chronic indwelling Foley; who presents with complaints of worsening shortness of breath.  Patient is currently unable to give his own history and therefore much of history is obtained from his wife who is present at bedside.  He had reportedly had a cough with productive yellow sputum for several days.  However, was found to be acutely worse yesterday.  Nursing facility staff noted a temperature of 101 yesterday afternoon.  He was given Tylenol with some mild improvement.  Furthermore nursing staff noted O2 saturations as low as 84% for which she was placed on supplemental oxygen.  EMS was called thereafter and he was given albuterol and Atrovent.  Wife notes that baseline he ambulates with the use of a motorized chair and has no difficulty with swallowing.  Since the acute worsening of the symptoms he has been more lethargic and not really following commands like normal.  His CODE status is to remain DO NOT RESUSCITATE, but family is okay with placing NG tube to give medications.  She also makes note that he did receive his influenza vaccine this year.  She denies any known history of heart failure.  ED Course: Upon admission to the emergency department patient was noted to be febrile up to 103.7 F, pulse 73-102, respiration 15-21, blood pressures maintained, O2 saturations maintained on 2 L of nasal cannula oxygen.  Labs revealed WBC 15.9, hemoglobin 11.5, potassium 2.8,  Review of Systems  Unable to perform ROS: Mental status change  Constitutional: Positive  for fever and malaise/fatigue.  Respiratory: Positive for cough, sputum production and shortness of breath.     Past Medical History:  Diagnosis Date  . Abnormality of gait   . Arthritis   . Brain bleed ([place]) 09/01/2016  . Cervical spondylosis   . Degenerative joint disease (DJD) of lumbar spine   . Depression   . Diplopia   . Dyslipidemia   . Essential tremor   . Foul smelling urine   . Frequent falls   . GERD (gastroesophageal reflux disease)   . Glycosuria   . Hearing difficulty    hearing aids  . Hyperlipidemia   . Hypertension   . Hyperthyroidism   . Memory loss   . Osteoarthritis   . Radiculopathy, lumbar region   . 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 Small vessel disease   . TIA (transient ischemic attack)     Past Surgical History:  Procedure Laterality Date  . APPENDECTOMY     done as a child  . GALLBLADDER SURGERY  2008  . KNEE ARTHROSCOPY Left    Dr. Hart Robinsons 2002  . knee injections Right    Dr. Adriana Mccallum  . TONSILLECTOMY     done as a child     reports that  has never smoked. he has never used smokeless tobacco. He reports that he drinks alcohol. He reports that he does not use drugs.  No Known Allergies  Family History  Problem Relation Age of Onset  . Stroke Mother   .  Dementia Mother   . Stroke Father   . Heart disease Father   . Diabetes Father   . Dementia Brother   . Renal Disease Brother   . Diabetes Brother   . Renal Disease Daughter     Prior to Admission medications   Medication Sig Start Date End Date Taking? Authorizing Provider  acetaminophen (TYLENOL) 325 MG tablet Take 650 mg by mouth every 4 (four) hours as needed for mild pain.   Yes [provider]  amLODipine (NORVASC) 10 MG tablet Take 1 tablet (10 mg total) by mouth daily. 05/29/17  Yes Reed, Tiffany L, DO  aspirin 81 MG tablet Take 81 mg by mouth daily.   Yes [provider]  Calcium Carb-Cholecalciferol 600-800  MG-UNIT CHEW Chew 1 tablet by mouth 2 (two) times daily.    Yes [provider]  carbidopa-levodopa (SINEMET IR) 25-100 MG tablet Take 1.5 tablets by mouth 3 (three) times daily. 09/18/17  Yes [name], MD  carboxymethylcellulose (REFRESH PLUS) 0.5 % SOLN Apply 2 drops to eye as needed (dry eyes).    Yes [provider]  clopidogrel (PLAVIX) 75 MG tablet Take 75 mg by mouth daily.   Yes [provider]  Co-Enzyme Q-10 100 MG CAPS Take 100 mg by mouth daily.   Yes [provider]  Cranberry-Vit C-Lactobacillus (RA CRANBERRY SUPPLEMENTS PO) Take 1 capsule by mouth 2 (two) times daily.   Yes [provider]  escitalopram (LEXAPRO) 10 MG tablet Take 10 mg by mouth daily.   Yes [provider]  guaifenesin (ROBITUSSIN) 100 MG/5ML syrup Take 200 mg by mouth every 6 (six) hours as needed for cough.   Yes [provider]  hydrochlorothiazide (MICROZIDE) 12.5 MG capsule Take 12.5 mg by mouth daily.   Yes [provider]  Lutein-Zeaxanthin 25-5 MG CAPS Take 1 capsule by mouth daily.   Yes [provider]  Memantine HCl-Donepezil HCl (NAMZARIC) 28-10 MG CP24 Take 1 capsule at bedtime by mouth.    Yes [provider]  Menthol, Topical Analgesic, (BIOFREEZE) 4 % GEL Apply 1 application topically 2 (two) times daily as needed (pain).    Yes [provider]  Misc Natural Products (GLUCOSAMINE CHOND COMPLEX/MSM PO) Take 1 tablet by mouth 3 (three) times daily.    Yes [provider]  Multiple Vitamin (MULTI-VITAMINS) TABS Take 1 tablet daily by mouth.    Yes [provider]  nebivolol (BYSTOLIC) 5 MG tablet Take 1 tablet (5 mg total) by mouth daily. 04/04/17  Yes Reed, Tiffany L, DO  Omega-3 Fatty Acids (OMEGA 3 PO) Take 1,000 mg 2 (two) times daily by mouth.    Yes [provider]  polyethylene glycol (MIRALAX / GLYCOLAX) packet Take 17 g by mouth every other day.   Yes [provider]  saw palmetto 500 MG capsule Take 500 mg by mouth daily.   Yes [provider]  senna-docusate (SENOKOT-S) 8.6-50 MG tablet Take 2 tablets 2 (two) times daily by mouth.    Yes [provider]  telmisartan (MICARDIS) 80 MG tablet Take 80 mg daily by mouth.   Yes [provider]  triamcinolone (NASACORT) 55 MCG/ACT AERO nasal inhaler Place 2 sprays into the nose at bedtime.   Yes [provider]  vitamin C (ASCORBIC ACID) 500 MG tablet Take 500 mg by mouth daily.   Yes [provider]    Physical Exam:  Constitutional: Elderly male who appears acutely ill Vitals:  11/28/17 0102 11/28/17 0248 11/28/17 0300 11/28/17 0329  BP:  (!) 117/51 (!) 119/46   Pulse:  79 73   Resp:  18 16   Temp:    (!) 101.7 F (38.7 C)  TempSrc:    Rectal  SpO2:  99% 99%   Weight: 79.4 kg (175 lb)     Height: 5\' 8"  (1.727 m)      Eyes: PERRL, lids and conjunctivae normal ENMT: Mucous membranes are moist. Posterior pharynx clear of any exudate or lesions.  Neck: normal, supple, no masses, no thyromegaly Respiratory: clear to auscultation bilaterally, no wheezing, no crackles. Normal respiratory effort. No accessory muscle use. Currently on 2 L of nasal cannula oxygen. Cardiovascular: Regular rate and rhythm, no murmurs / rubs / gallops. No extremity edema. 2+ pedal pulses. No carotid bruits.  Abdomen: no tenderness, no masses palpated. No hepatosplenomegaly. Bowel sounds positive.  Musculoskeletal: no clubbing / cyanosis. No joint deformity upper and lower extremities.  Skin: Diaphoretic.  No clear signs of rash or bruising. Neurologic: CN 2-12 grossly intact. Tremor present Psychiatric: Patient lethargic not arousable at this time and therefore unable to assess.    Labs on Admission: I have personally reviewed following labs and imaging studies  CBC: Recent Labs  Lab 11/28/17 0026  WBC 15.9*  NEUTROABS 13.4*  HGB 11.5*  HCT 34.0*  MCV 91.9   PLT 756   Basic Metabolic Panel: Recent Labs  Lab 11/28/17 0026  NA 138  K 2.8*  CL 100*  CO2 24  GLUCOSE 226*  BUN 25*  CREATININE 1.07  CALCIUM 9.2   GFR: Estimated Creatinine Clearance: 44.4 mL/min (by C-G formula based on SCr of 1.07 mg/dL). Liver Function Tests: Recent Labs  Lab 11/28/17 0026  AST 25  ALT 9*  ALKPHOS 61  BILITOT 0.7  PROT 6.3*  ALBUMIN 3.1*   No results for input(s): LIPASE, AMYLASE in the last 168 hours. No results for input(s): AMMONIA in the last 168 hours. Coagulation Profile: No results for input(s): INR, PROTIME in the last 168 hours. Cardiac Enzymes: No results for input(s): CKTOTAL, CKMB, CKMBINDEX, TROPONINI in the last 168 hours. BNP (last 3 results) No results for input(s): PROBNP in the last 8760 hours. HbA1C: No results for input(s): HGBA1C in the last 72 hours. CBG: No results for input(s): GLUCAP in the last 168 hours. Lipid Profile: No results for input(s): CHOL, HDL, LDLCALC, TRIG, CHOLHDL, LDLDIRECT in the last 72 hours. Thyroid Function Tests: No results for input(s): TSH, T4TOTAL, FREET4, T3FREE, THYROIDAB in the last 72 hours. Anemia Panel: No results for input(s): VITAMINB12, FOLATE, FERRITIN, TIBC, IRON, RETICCTPCT in the last 72 hours. Urine analysis:    Component Value Date/Time   COLORURINE YELLOW 09/07/2016 1350   APPEARANCEUR CLEAR 09/07/2016 1350   LABSPEC 1.021 09/07/2016 1350   PHURINE 5.5 09/07/2016 1350   GLUCOSEU NEGATIVE 09/07/2016 1350   HGBUR NEGATIVE 09/07/2016 1350   BILIRUBINUR NEGATIVE 09/07/2016 1350   KETONESUR NEGATIVE 09/07/2016 1350   PROTEINUR NEGATIVE 09/07/2016 1350   NITRITE NEGATIVE 09/07/2016 1350   LEUKOCYTESUR NEGATIVE 09/07/2016 1350   Sepsis Labs: No results found for this or any previous visit (from the past 240 hour(s)).   Radiological Exams on Admission: Dg Chest Port 1 View  Result Date: 11/28/2017 CLINICAL DATA:  Fever to 103.  Unresponsive.  Flu like symptoms. EXAM:  PORTABLE CHEST 1 VIEW COMPARISON:  09/08/2016 FINDINGS: Low lung volumes with crowding of interstitial lung markings. No overt pulmonary edema, effusion or  pneumothorax. No pulmonary consolidations. Normal size heart. Moderate aortic atherosclerosis along the transverse portion. No aneurysm. Chronic remote right-sided rib fractures involving the right seventh and eighth ribs with angulated deformity. Right upper quadrant surgical clips presumably from prior cholecystectomy. Osteoarthritis of the left glenohumeral joint with spurring off the humeral head and glenoid. IMPRESSION: Low lung volumes with crowding of interstitial lung markings. No active pulmonary disease. Aortic atherosclerosis. Electronically Signed   By: Ashley Royalty M.D.   On: 11/28/2017 01:12    EKG: Independently reviewed.  Sinus rhythm with QTC noted to be  Assessment/Plan Sepsis 2/2 influenza A: Patient found to be tachycardic, tachypneic, and febrile up to 103.1 F.  Labs revealed WBC of 15.9.  Initial lactic acid was 3.1.  Flu swab was positive for influenza A as a likely source.  Chest x-ray showed no clear infiltrate, but question possible underlying pneumonia.  Sepsis protocol has been initiated and patient was given empiric antibiotics of cefepime and vancomycin given concern for healthcare associated pneumonia. - Admit to stepdown bed - Follow-up blood and sputum cultures - Place NGT to administer Tamiflu - Check Abdominal x-ray for placement - Continue empiric antibiotics of cefepime and vancomycin  - Trend lactic acid level - Tylenol prn Fever  Acute respiratory failure with hypoxia: At baseline patient not on oxygen, but found to be hypoxic with O2 saturations as low as 84% on room air requiring 2 L of nasal cannula oxygen to maintain O2 saturation greater than 92%. - Continuous pulse oximetry with nasal cannula oxygen as needed - DuoNeb's 4 times daily and prn as needed  Acute encephalopathy: Patient noted to be  acutely more altered and noted not to be safe to swallow at this time. - Elevate head of bed - Aspiration precautions - Neurochecks  Hypokalemia: Initial potassium noted to be 2.8.  He was given 40 mEq of potassium chloride IV in the ED. - Check magnesium level - Continue to replace potassium as needed  Parkinson disease with dementia: Stable - Restart home meds when able through nasogastric tube  Essential hypertension - Restart home blood pressure medications when medically appropriate  Chronic indwelling Foley - Change of Foley catheter - Follow-up urinalysis  History of intracranial hemorrhage secondary to fall  DVT prophylaxis: heparin   Code Status: DNR Family Communication: Discussed plan of care with the patient and family present at bedside Disposition Plan: Discharge back to well since once medically stable Consults called: none Admission status: Inpatient   Norval Morton MD [facility] Pager [phone]   If 7PM-7AM, please contact night-coverage [email or web address] Password [email or web address]  11/28/2017, 3:35 AM

## 2017-11-28 NOTE — Progress Notes (Signed)
PROGRESS NOTE        PATIENT DETAILS Name: Kenneth Stark Age: 84 y.o. Sex: male Date of Birth: 12-Apr-1925 Admit Date: 11/28/2017 Admitting Physician Norval Morton, MD HWE:[id number], Rexene Edison, DO  Brief Narrative: Patient is a 84 y.o. male with chronic indwelling Foley catheter-prior history of Parkinson's disease with dementia, hypertension, dyslipidemia who presented to the ED with fever, cough, shortness of breath, confusion-he was found to have acute hypoxemic respiratory failure in the setting of influenza and admitted to the hospitalist service.  See below for further details  Subjective: He is lethargic-briefly opens eyes but does not follow commands.  Assessment/Plan: Acute hypoxic respiratory failure with sepsis secondary to influenza/aspiration pneumonia: Still appears to be acutely sick-lethargic-febrile as of this morning-on oxygen on 4 L via nasal cannula to maintain O2 sats over 90%.  Continue Tamiflu-given the fact that he is acutely sick and is a danger of worsening-would continue with empiric antibiotics for another 24 hours-he is "gurgling" and pooling secretions and is at risk of  aspiration pneumonitis.  Spoke with spouse at bedside-we discussed continuing with current plan with no further escalation in care and reevaluate tomorrow.  If he deteriorates in the meantime, she is aware that we may have to transition to hospice/comfort care.  Acute metabolic encephalopathy: Suspect secondary to hypoxia and sepsis.  He probably has significant amount of dementia at baseline and is at risk of delirium/sundowning during his hospital stay.  Hopefully his mentation will improve after treatment of underlying sepsis/hypoxia/influenza.  Dysphagia: He appears to be "gurgling" and pooling secretions-NG tube was placed in the emergency room so that he could be given Tamiflu-seen by speech therapy today-started on a dysphagia 2 diet.  NG tube has been  discontinued.  Spouse is aware of aspiration risk -continue suctioning, and other supportive care.  Hypokalemia: Continue to replete.  Dementia: Resume Namenda and Aricept  Parkinson's disease: Resume Sinemet  History of TIA: Resume antiplatelet agents  Hypertension: Blood pressure is soft-hold all antihypertensives  Chronic indwelling Foley catheter  Palliative care: DNR in place-frail elderly male with sepsis and acute hypoxic respiratory failure due to influenza-probable aspiration pneumonitis.  He is incredibly frail-and is at risk of further decompensation and deterioration.  Discussed with spouse at bedside-we have decided to continue with the above-noted plan for another 24 hours and reassessing tomorrow-but in the meantime if he deteriorates further-spouse is aware that we may comfort/hospice care.  His overall prognosis is poor.  DVT Prophylaxis: Prophylactic Lovenox  Code Status: DNR  Family Communication: Spouse at bedside  Disposition Plan: Remain inpatient  Antimicrobial agents: Anti-infectives (From admission, onward)   Start     Dose/Rate Route Frequency Ordered Stop   11/29/17 0600  vancomycin (VANCOCIN) IVPB 1000 mg/200 mL premix     1,000 mg 200 mL/hr over 60 Minutes Intravenous Every 24 hours 11/28/17 0513     11/28/17 2200  oseltamivir (TAMIFLU) capsule 30 mg     30 mg Oral 2 times daily 11/28/17 1214 12/03/17 0959   11/28/17 1200  ceFEPIme (MAXIPIME) 1 g in sodium chloride 0.9 % 100 mL IVPB     1 g 200 mL/hr over 30 Minutes Intravenous Every 12 hours 11/28/17 0347 12/06/17 1159   11/28/17 1000  oseltamivir (TAMIFLU) 6 MG/ML suspension 30 mg  Status:  Discontinued     30 mg Oral 2  times daily 11/28/17 0351 11/28/17 0352   11/28/17 0415  oseltamivir (TAMIFLU) 6 MG/ML suspension 30 mg  Status:  Discontinued     30 mg Per Tube 2 times daily 11/28/17 0352 11/28/17 1214   11/28/17 0230  oseltamivir (TAMIFLU) capsule 30 mg  Status:  Discontinued     30 mg  Oral 2 times daily 11/28/17 0229 11/28/17 0256   11/28/17 0145  vancomycin (VANCOCIN) IVPB 1000 mg/200 mL premix     1,000 mg 200 mL/hr over 60 Minutes Intravenous  Once 11/28/17 0130 11/28/17 0520   11/28/17 0130  ceFEPIme (MAXIPIME) 2 g in sodium chloride 0.9 % 100 mL IVPB     2 g 200 mL/hr over 30 Minutes Intravenous  Once 11/28/17 0119 11/28/17 0359      Procedures: None  CONSULTS:  None  Time spent: 40 minutes-Greater than 50% of this time was spent in counseling, explanation of diagnosis, planning of further management, and coordination of care.  MEDICATIONS: Scheduled Meds: . chlorhexidine  15 mL Mouth Rinse BID  . Chlorhexidine Gluconate Cloth  6 each Topical Q0600  . enoxaparin (LOVENOX) injection  40 mg Subcutaneous Q24H  . ipratropium-albuterol  3 mL Nebulization QID  . mouth rinse  15 mL Mouth Rinse q12n4p  . mupirocin ointment  1 application Nasal BID  . oseltamivir  30 mg Oral BID   Continuous Infusions: . sodium chloride 75 mL/hr at 11/28/17 1322  . ceFEPime (MAXIPIME) IV Stopped (11/28/17 1253)  . [START ON 11/29/2017] vancomycin     PRN Meds:.acetaminophen **OR** acetaminophen, ipratropium-albuterol, ondansetron **OR** ondansetron (ZOFRAN) IV   PHYSICAL EXAM: Vital signs: Vitals:   11/28/17 0330 11/28/17 0400 11/28/17 0510 11/28/17 0803  BP: (!) 115/47 (!) 125/54 (!) 109/40   Pulse: 75 79 65   Resp: (!) 21 20 20    Temp:   98 F (36.7 C)   TempSrc:   Axillary   SpO2: 94% 91% 94% 96%  Weight:   74.7 kg (164 lb 10.9 oz)   Height:   5\' 8"  (1.727 m)    Filed Weights   11/28/17 0102 11/28/17 0510  Weight: 79.4 kg (175 lb) 74.7 kg (164 lb 10.9 oz)   Body mass index is 25.04 kg/m.   General appearance: Lethargic-confused-barely opens eyes. Eyes:, pupils equally reactive to light and accomodation HEENT: Atraumatic and Normocephalic Neck: supple, no JVD. No cervical lymphadenopathy.  Resp:Good air entry bilaterally, coarse rales up to mid  lungs. CVS: S1 S2 regular, no murmurs.  GI: Bowel sounds present, Non tender and not distended with no gaurding, rigidity or rebound.No organomegaly Extremities: B/L Lower Ext shows no edema, both legs are warm to touch Neurology: Generalized weakness-but seems to be moving all 4 extremities Musculoskeletal:No digital cyanosis Skin:No Rash, warm and dry Wounds:N/A  I have personally reviewed following labs and imaging studies  LABORATORY DATA: CBC: Recent Labs  Lab 11/28/17 0026 11/28/17 0513  WBC 15.9* 15.4*  NEUTROABS 13.4*  --   HGB 11.5* 9.7*  HCT 34.0* 28.1*  MCV 91.9 91.5  PLT 215 761    Basic Metabolic Panel: Recent Labs  Lab 11/28/17 0026 11/28/17 0513  NA 138 139  K 2.8* 2.9*  CL 100* 105  CO2 24 22  GLUCOSE 226* 252*  BUN 25* 23*  CREATININE 1.07 1.04  CALCIUM 9.2 8.3*  MG  --  1.4*    GFR: Estimated Creatinine Clearance: 45.7 mL/min (by C-G formula based on SCr of 1.04 mg/dL).  Liver Function Tests: Recent  Labs  Lab 11/28/17 0026  AST 25  ALT 9*  ALKPHOS 61  BILITOT 0.7  PROT 6.3*  ALBUMIN 3.1*   No results for input(s): LIPASE, AMYLASE in the last 168 hours. No results for input(s): AMMONIA in the last 168 hours.  Coagulation Profile: No results for input(s): INR, PROTIME in the last 168 hours.  Cardiac Enzymes: No results for input(s): CKTOTAL, CKMB, CKMBINDEX, TROPONINI in the last 168 hours.  BNP (last 3 results) No results for input(s): PROBNP in the last 8760 hours.  HbA1C: No results for input(s): HGBA1C in the last 72 hours.  CBG: No results for input(s): GLUCAP in the last 168 hours.  Lipid Profile: No results for input(s): CHOL, HDL, LDLCALC, TRIG, CHOLHDL, LDLDIRECT in the last 72 hours.  Thyroid Function Tests: No results for input(s): TSH, T4TOTAL, FREET4, T3FREE, THYROIDAB in the last 72 hours.  Anemia Panel: No results for input(s): VITAMINB12, FOLATE, FERRITIN, TIBC, IRON, RETICCTPCT in the last 72  hours.  Urine analysis:    Component Value Date/Time   COLORURINE YELLOW 11/28/2017 0650   APPEARANCEUR CLOUDY (A) 11/28/2017 0650   LABSPEC 1.019 11/28/2017 0650   PHURINE 5.0 11/28/2017 0650   GLUCOSEU NEGATIVE 11/28/2017 0650   HGBUR MODERATE (A) 11/28/2017 0650   BILIRUBINUR NEGATIVE 11/28/2017 0650   KETONESUR 5 (A) 11/28/2017 0650   PROTEINUR 30 (A) 11/28/2017 0650   NITRITE NEGATIVE 11/28/2017 0650   LEUKOCYTESUR LARGE (A) 11/28/2017 0650    Sepsis Labs: Lactic Acid, Venous    Component Value Date/Time   LATICACIDVEN 1.74 11/28/2017 0332    MICROBIOLOGY: Recent Results (from the past 240 hour(s))  MRSA PCR Screening     Status: Abnormal   Collection Time: 11/28/17  5:43 AM  Result Value Ref Range Status   MRSA by PCR POSITIVE (A) NEGATIVE Final    Comment:        The GeneXpert MRSA Assay (FDA approved for NASAL specimens only), is one component of a comprehensive MRSA colonization surveillance program. It is not intended to diagnose MRSA infection nor to guide or monitor treatment for MRSA infections. RESULT CALLED TO, READ BACK BY AND VERIFIED WITH: R.[email or web address] RN [id number] [email or web address] Performed at [facility] [place] [address] [place], [place] [address]     RADIOLOGY STUDIES/RESULTS: Dg Abd 1 View  Result Date: 11/28/2017 CLINICAL DATA:  Nasogastric tube placement. EXAM: ABDOMEN - 1 VIEW COMPARISON:  None. FINDINGS: Nasogastric tube tip projects in proximal stomach. Included bowel gas pattern is nondilated and nonobstructive. No intra-mass effect. Surgical clips in the included right abdomen compatible with cholecystectomy. Ovoid density in LEFT lower quadrant is likely enteric. IMPRESSION: Nasogastric tube tip projects in proximal stomach. Electronically Signed   By: Elon Alas M.D.   On: 11/28/2017 06:10   Dg Chest Port 1 View  Result Date: 11/28/2017 CLINICAL DATA:  Fever to 103.  Unresponsive.  Flu like symptoms. EXAM: PORTABLE  CHEST 1 VIEW COMPARISON:  09/08/2016 FINDINGS: Low lung volumes with crowding of interstitial lung markings. No overt pulmonary edema, effusion or pneumothorax. No pulmonary consolidations. Normal size heart. Moderate aortic atherosclerosis along the transverse portion. No aneurysm. Chronic remote right-sided rib fractures involving the right seventh and eighth ribs with angulated deformity. Right upper quadrant surgical clips presumably from prior cholecystectomy. Osteoarthritis of the left glenohumeral joint with spurring off the humeral head and glenoid. IMPRESSION: Low lung volumes with crowding of interstitial lung markings. No active pulmonary disease. Aortic atherosclerosis. Electronically Signed   By:  Ashley Royalty M.D.   On: 11/28/2017 01:12     LOS: 0 days   Oren Binet, MD  [facility] Pager:[phone]  If 7PM-7AM, please contact night-coverage [email or web address] Password [email or web address] 11/28/2017, 1:53 PM

## 2017-11-29 ENCOUNTER — Inpatient Hospital Stay (HOSPITAL_COMMUNITY): Payer: Medicare Other

## 2017-11-29 LAB — CBC
HEMATOCRIT: 31.8 % — AB (ref 39.0–52.0)
Hemoglobin: 10.5 g/dL — ABNORMAL LOW (ref 13.0–17.0)
MCH: 30.7 pg (ref 26.0–34.0)
MCHC: 33 g/dL (ref 30.0–36.0)
MCV: 93 fL (ref 78.0–100.0)
PLATELETS: 195 10*3/uL (ref 150–400)
RBC: 3.42 MIL/uL — ABNORMAL LOW (ref 4.22–5.81)
RDW: 14.6 % (ref 11.5–15.5)
WBC: 9 10*3/uL (ref 4.0–10.5)

## 2017-11-29 LAB — BASIC METABOLIC PANEL
Anion gap: 9 (ref 5–15)
BUN: 19 mg/dL (ref 6–20)
CHLORIDE: 110 mmol/L (ref 101–111)
CO2: 24 mmol/L (ref 22–32)
Calcium: 8.3 mg/dL — ABNORMAL LOW (ref 8.9–10.3)
Creatinine, Ser: 0.85 mg/dL (ref 0.61–1.24)
GFR calc Af Amer: >60 mL/min (ref >60)
GLUCOSE: 77 mg/dL (ref 65–99)
POTASSIUM: 3.8 mmol/L (ref 3.5–5.1)
Sodium: 143 mmol/L (ref 135–145)

## 2017-11-29 LAB — MAGNESIUM: Magnesium: 2 mg/dL (ref 1.7–2.4)

## 2017-11-29 MED ORDER — SODIUM CHLORIDE 0.9 % IV SOLN
3.0000 g | Freq: Three times a day (TID) | INTRAVENOUS | Status: DC
  Administered 2017-11-29 – 2017-11-30 (×3): 3 g via INTRAVENOUS
  Filled 2017-11-29 (×5): qty 3

## 2017-11-29 MED ORDER — METHYLPREDNISOLONE SODIUM SUCC 40 MG IJ SOLR
40.0000 mg | Freq: Three times a day (TID) | INTRAMUSCULAR | Status: DC
  Administered 2017-11-29 – 2017-11-30 (×3): 40 mg via INTRAVENOUS
  Filled 2017-11-29 (×3): qty 1

## 2017-11-29 NOTE — Progress Notes (Addendum)
  Speech Language Pathology Treatment: Dysphagia  Patient Details Name: Kenneth Stark MRN: [id number] DOB: [birth year] Today's Date: 11/29/2017 Time: 4827-0786 SLP Time Calculation (min) (ACUTE ONLY): 15 min  Assessment / Plan / Recommendation Clinical Impression  RN expressed concern about pt re: wheezing, ? Difficulty swallowing. Pt was seen at bedside to assess diet tolerance. Pt observed with puree and thin liquids. No overt s/s aspiration observed at this time, however, pt is at risk for aspiration given advanced age and weakness related to flu. Safe swallow precautions posted at [facility]. Will continue Dys 2 diet and thin liquids. Further restrictions in diet may decrease pt willingness to eat. ST will continue to follow for assessment of diet tolerance and education. Consulted with RN and MD.    HPI HPI: 84 year old male admitted 11/28/17 with flu, sepsis. PMH significant for BSE ordered to evaluate swallow safety and identify least restrictive diet.      SLP Plan  Continue with current plan of care       Recommendations  Diet recommendations: Dysphagia 2 (fine chop);Thin liquid Liquids provided via: Cup;Straw Medication Administration: Whole meds with puree Supervision: Patient able to self feed;Full supervision/cueing for compensatory strategies;Staff to assist with self feeding Compensations: Minimize environmental distractions;Slow rate;Small sips/bites Postural Changes and/or Swallow Maneuvers: Out of bed for meals;Seated upright 90 degrees;Upright 30-60 min after meal                Oral Care Recommendations: Oral care BID Follow up Recommendations: None SLP Visit Diagnosis: Dysphagia, unspecified (R13.10) Plan: Continue with current plan of care       GO              Kenneth Stark Ore [facility], CCC-SLP Speech Language Pathologist [phone]  Kenneth Stark 11/29/2017, 10:47 AM

## 2017-11-29 NOTE — Progress Notes (Signed)
Initial Nutrition Assessment  DOCUMENTATION CODES:   (Will assess for malnutrition at follow-up, if appropriate at that time)  INTERVENTION:  - Will order Magic Cup BID with meals (lunch and dinner), each supplement provides 290 kcal and 9 grams of protein. - Tech/RN to assist with feeding during meals.  - Will monitor POC/GOC.   NUTRITION DIAGNOSIS:   Inadequate oral intake related to lethargy/confusion as evidenced by meal completion < 25%.  GOAL:   Patient will meet greater than or equal to 90% of their needs  MONITOR:   PO intake, Weight trends, Labs, Other (Comment)(POC/GOC)  REASON FOR ASSESSMENT:   Low Braden  ASSESSMENT:   84 y.o. male with chronic indwelling Foley catheter-prior history of Parkinson's disease with dementia, hypertension, dyslipidemia who presented to the ED with fever, cough, shortness of breath, confusion-he was found to have acute hypoxemic respiratory failure in the setting of influenza.he had significant metabolic encephalopathy and debility-and was accumulating his oral secretions-with high likelihood of ongoing aspiration causing pneumonia as well.  He was started on broad-spectrum antimicrobial therapy, Tamiflu and other supportive care.  BMI indicated overweight, appropriate for advanced age. No intakes documented since admission. No family/visitors present at this time and pt noted to be a/o to self only. He was sleeping at time of RD visit with untouched lunch tray at bedside. Spoke with tech who reports that pt ate a few bites of breakfast but then began coughing so she stopped feeding him. She states he has been asleep since lunch tray was delivered and will try to feed pt when he awakes.   SLP following pt and recommended continuing current diet: Dysphagia 2, thin liquids. Per chart review, weight has been stable since 09/03/17. Unable to perform NFPE at this time given pt sleeping and did not want to startle him. Reviewed Dr. Nena Alexander note  from yesterday; will continue to monitor for POC.  Medications reviewed. Labs reviewed; Ca: 8.3 mg/dL.  IVF: NS @ 40 mL/hr.      NUTRITION - FOCUSED PHYSICAL EXAM:  Unable to complete/assess at this time.   Diet Order:  Aspiration precautions DIET DYS 2 Room service appropriate? Yes; Fluid consistency: Thin  EDUCATION NEEDS:   No education needs have been identified at this time  Skin:  Skin Assessment: Reviewed RN Assessment  Last BM:  PTA/unknown  Height:   Ht Readings from Last 1 Encounters:  11/28/17 5\' 8"  (1.727 m)    Weight:   Wt Readings from Last 1 Encounters:  11/28/17 164 lb 10.9 oz (74.7 kg)    Ideal Body Weight:  70 kg  BMI:  Body mass index is 25.04 kg/m.  Estimated Nutritional Needs:   Kcal:  1645-1870 (22-25 kcal/kg)  Protein:  65-75 grams  Fluid:  >/= 1.8 L/day     Jarome Matin, MS, RD, LDN, [facility] Inpatient Clinical Dietitian Pager # [phone] After hours/weekend pager # [phone]

## 2017-11-29 NOTE — Progress Notes (Signed)
PROGRESS NOTE        PATIENT DETAILS Name: Kenneth Stark Stark Age: 84 y.o. Sex: male Date of Birth: [birth year] Admit Date: 11/28/2017 Admitting Physician Norval Morton, MD YPP:JKDT, Rexene Edison, DO  Brief Narrative: Patient is a 84 y.o. male with chronic indwelling Foley catheter-prior history of Parkinson's disease with dementia, hypertension, dyslipidemia who presented to the ED with fever, cough, shortness of breath, confusion-he was found to have acute hypoxemic respiratory failure in the setting of influenza.he had significant metabolic encephalopathy and debility-and was accumulating his oral secretions-with high likelihood of ongoing aspiration causing pneumonia as well.  He was started on broad-spectrum antimicrobial therapy, Tamiflu and other supportive care and admitted to the hospitalist service.  With supportive care, he has markedly improved-plans are to continue with current care as outlined below.  See below for further details.   Subjective: He is much more awake and alert compared to yesterday-he is not accumulating secretions as much as yesterday as well.  Assessment/Plan: Acute hypoxic respiratory failure with sepsis secondary to influenza and aspiration pneumonia: Looks markedly better today- no longer lethargic-in fact following commands.  Sepsis pathophysiology seems to have rapidly resolved.  He does not seem to be accumulating oral secretions as he did yesterday.  I suspect with respiratory failure/influenza-he became acutely weak and deconditioned causing aspiration pneumonitis.  Plans are to continue with Tamiflu, we will discontinue both vancomycin and Zosyn and transition to Unasyn.  Blood cultures on 2/13 are still pending.  Acute metabolic encephalopathy: Suspect secondary to hypoxia and sepsis.  He has rapidly improved-and seems to be back to his usual baseline.  He has significant amount of dementia at baseline-and is at risk of sundowning  and delirium during this hospital stay.   Dysphagia: Initially appeared to be accumulating oral secretions-given acute illness with influenza and acute debility-he probably aspirated and remains on Unasyn.  Seen by speech therapy-continue dysphagia 2 diet.  Spoke to patient spouse over the phone-family is aware of aspiration risk.  Hypokalemia: Repleted.  Dementia: Continue Namenda and Aricept  Parkinson's disease: Continue Sinemet  History of TIA: Continue antiplatelet agents  Hypertension: Blood pressure is controlled without the use of any antihypertensives-continue to hold all antihypertensives.   Chronic indwelling Foley catheter  Palliative care: DNR in place-frail elderly male with sepsis and acute hypoxic respiratory failure due to influenza-plicated by acute debility/weakness causing aspiration pneumonitis.  He seems to have rapidly improved in the past 24 hours-plans are to continue with antibiotics and other supportive care.  DNR in place.  Family aware that there is no further room to escalate care in case he deteriorates.  However I suspect that he will probably continue to improve and be discharged back to his skilled nursing facility in the next few days.   DVT Prophylaxis: Prophylactic Lovenox  Code Status: DNR  Family Communication: Spouse of the phone, son at bedside  Disposition Plan: Remain inpatient-back to SNF in the next few days.  Antimicrobial agents: Anti-infectives (From admission, onward)   Start     Dose/Rate Route Frequency Ordered Stop   11/29/17 1200  Ampicillin-Sulbactam (UNASYN) 3 g in sodium chloride 0.9 % 100 mL IVPB     3 g 200 mL/hr over 30 Minutes Intravenous Every 8 hours 11/29/17 1118     11/29/17 0600  vancomycin (VANCOCIN) IVPB 1000 mg/200 mL premix  Status:  Discontinued     1,000 mg 200 mL/hr over 60 Minutes Intravenous Every 24 hours 11/28/17 0513 11/29/17 1002   11/28/17 2200  oseltamivir (TAMIFLU) capsule 30 mg     30 mg Oral 2  times daily 11/28/17 1214 12/03/17 0959   11/28/17 1200  ceFEPIme (MAXIPIME) 1 g in sodium chloride 0.9 % 100 mL IVPB  Status:  Discontinued     1 g 200 mL/hr over 30 Minutes Intravenous Every 12 hours 11/28/17 0347 11/29/17 1104   11/28/17 1000  oseltamivir (TAMIFLU) 6 MG/ML suspension 30 mg  Status:  Discontinued     30 mg Oral 2 times daily 11/28/17 0351 11/28/17 0352   11/28/17 0415  oseltamivir (TAMIFLU) 6 MG/ML suspension 30 mg  Status:  Discontinued     30 mg Per Tube 2 times daily 11/28/17 0352 11/28/17 1214   11/28/17 0230  oseltamivir (TAMIFLU) capsule 30 mg  Status:  Discontinued     30 mg Oral 2 times daily 11/28/17 0229 11/28/17 0256   11/28/17 0145  vancomycin (VANCOCIN) IVPB 1000 mg/200 mL premix     1,000 mg 200 mL/hr over 60 Minutes Intravenous  Once 11/28/17 0130 11/28/17 0520   11/28/17 0130  ceFEPIme (MAXIPIME) 2 g in sodium chloride 0.9 % 100 mL IVPB     2 g 200 mL/hr over 30 Minutes Intravenous  Once 11/28/17 0119 11/28/17 0359      Procedures: None  CONSULTS:  None  Time spent: 40 minutes-Greater than 50% of this time was spent in counseling, explanation of diagnosis, planning of further management, and coordination of care.  MEDICATIONS: Scheduled Meds: . aspirin  81 mg Oral Daily  . carbidopa-levodopa  1.5 tablet Oral TID  . chlorhexidine  15 mL Mouth Rinse BID  . Chlorhexidine Gluconate Cloth  6 each Topical Q0600  . clopidogrel  75 mg Oral Daily  . memantine  28 mg Oral QHS   And  . donepezil  10 mg Oral QHS  . enoxaparin (LOVENOX) injection  40 mg Subcutaneous Q24H  . escitalopram  10 mg Oral Daily  . ipratropium-albuterol  3 mL Nebulization TID  . mouth rinse  15 mL Mouth Rinse q12n4p  . methylPREDNISolone (SOLU-MEDROL) injection  40 mg Intravenous Q8H  . mupirocin ointment  1 application Nasal BID  . oseltamivir  30 mg Oral BID  . polyethylene glycol  17 g Oral QODAY  . triamcinolone  2 spray Nasal QHS   Continuous Infusions: . sodium  chloride 40 mL/hr at 11/28/17 1504  . ampicillin-sulbactam (UNASYN) IV     PRN Meds:.acetaminophen **OR** acetaminophen, ipratropium-albuterol, ondansetron **OR** ondansetron (ZOFRAN) IV, polyvinyl alcohol   PHYSICAL EXAM: Vital signs: Vitals:   11/28/17 2021 11/28/17 2114 11/29/17 0438 11/29/17 0850  BP: (!) 152/58  139/67   Pulse: (!) 50  (!) 53   Resp: 20  19   Temp: 98.8 F (37.1 C)  98.1 F (36.7 C)   TempSrc: Oral  Oral   SpO2: 100% 98% 93% 98%  Weight:      Height:       Filed Weights   11/28/17 0102 11/28/17 0510  Weight: 79.4 kg (175 lb) 74.7 kg (164 lb 10.9 oz)   Body mass index is 25.04 kg/m.   General appearance: Lethargic-confused-barely opens eyes. Eyes:, pupils equally reactive to light and accomodation HEENT: Atraumatic and Normocephalic Neck: supple, no JVD. No cervical lymphadenopathy.  Resp:Good air entry bilaterally, coarse rales up to mid lungs. CVS: S1 S2 regular, no  murmurs.  GI: Bowel sounds present, Non tender and not distended with no gaurding, rigidity or rebound.No organomegaly Extremities: B/L Lower Ext shows no edema, both legs are warm to touch Neurology: Generalized weakness-but seems to be moving all 4 extremities Musculoskeletal:No digital cyanosis Skin:No Rash, warm and dry Wounds:N/A  I have personally reviewed following labs and imaging studies  LABORATORY DATA: CBC: Recent Labs  Lab 11/28/17 0026 11/28/17 0513 11/29/17 0608  WBC 15.9* 15.4* 9.0  NEUTROABS 13.4*  --   --   HGB 11.5* 9.7* 10.5*  HCT 34.0* 28.1* 31.8*  MCV 91.9 91.5 93.0  PLT 215 199 761    Basic Metabolic Panel: Recent Labs  Lab 11/28/17 0026 11/28/17 0513 11/29/17 0608  NA 138 139 143  K 2.8* 2.9* 3.8  CL 100* 105 110  CO2 24 22 24   GLUCOSE 226* 252* 77  BUN 25* 23* 19  CREATININE 1.07 1.04 0.85  CALCIUM 9.2 8.3* 8.3*  MG  --  1.4* 2.0    GFR: Estimated Creatinine Clearance: 55.9 mL/min (by C-G formula based on SCr of 0.85  mg/dL).  Liver Function Tests: Recent Labs  Lab 11/28/17 0026  AST 25  ALT 9*  ALKPHOS 61  BILITOT 0.7  PROT 6.3*  ALBUMIN 3.1*   No results for input(s): LIPASE, AMYLASE in the last 168 hours. No results for input(s): AMMONIA in the last 168 hours.  Coagulation Profile: No results for input(s): INR, PROTIME in the last 168 hours.  Cardiac Enzymes: No results for input(s): CKTOTAL, CKMB, CKMBINDEX, TROPONINI in the last 168 hours.  BNP (last 3 results) No results for input(s): PROBNP in the last 8760 hours.  HbA1C: No results for input(s): HGBA1C in the last 72 hours.  CBG: No results for input(s): GLUCAP in the last 168 hours.  Lipid Profile: No results for input(s): CHOL, HDL, LDLCALC, TRIG, CHOLHDL, LDLDIRECT in the last 72 hours.  Thyroid Function Tests: No results for input(s): TSH, T4TOTAL, FREET4, T3FREE, THYROIDAB in the last 72 hours.  Anemia Panel: No results for input(s): VITAMINB12, FOLATE, FERRITIN, TIBC, IRON, RETICCTPCT in the last 72 hours.  Urine analysis:    Component Value Date/Time   COLORURINE YELLOW 11/28/2017 0650   APPEARANCEUR CLOUDY (A) 11/28/2017 0650   LABSPEC 1.019 11/28/2017 0650   PHURINE 5.0 11/28/2017 0650   GLUCOSEU NEGATIVE 11/28/2017 0650   HGBUR MODERATE (A) 11/28/2017 0650   BILIRUBINUR NEGATIVE 11/28/2017 0650   KETONESUR 5 (A) 11/28/2017 0650   PROTEINUR 30 (A) 11/28/2017 0650   NITRITE NEGATIVE 11/28/2017 0650   LEUKOCYTESUR LARGE (A) 11/28/2017 0650    Sepsis Labs: Lactic Acid, Venous    Component Value Date/Time   LATICACIDVEN 1.74 11/28/2017 0332    MICROBIOLOGY: Recent Results (from the past 240 hour(s))  MRSA PCR Screening     Status: Abnormal   Collection Time: 11/28/17  5:43 AM  Result Value Ref Range Status   MRSA by PCR POSITIVE (A) NEGATIVE Final    Comment:        The GeneXpert MRSA Assay (FDA approved for NASAL specimens only), is one component of a comprehensive MRSA  colonization surveillance program. It is not intended to diagnose MRSA infection nor to guide or monitor treatment for MRSA infections. RESULT CALLED TO, READ BACK BY AND VERIFIED WITH: R.CORKRIN RN [phone] [email or web address] Performed at [facility], [place] [address], [place], [place] [address]     RADIOLOGY STUDIES/RESULTS: Dg Abd 1 View  Result Date: 11/28/2017 CLINICAL DATA:  Nasogastric tube placement. EXAM: ABDOMEN - 1 VIEW COMPARISON:  None. FINDINGS: Nasogastric tube tip projects in proximal stomach. Included bowel gas pattern is nondilated and nonobstructive. No intra-mass effect. Surgical clips in the included right abdomen compatible with cholecystectomy. Ovoid density in LEFT lower quadrant is likely enteric. IMPRESSION: Nasogastric tube tip projects in proximal stomach. Electronically Signed   By: Elon Alas M.D.   On: 11/28/2017 06:10   Dg Chest Port 1 View  Result Date: 11/29/2017 CLINICAL DATA:  Lethargy and confusion in patient with influenza. EXAM: PORTABLE CHEST 1 VIEW COMPARISON:  Single-view of the chest 11/28/2017 and 09/08/2016. FINDINGS: There is new patchy left basilar airspace disease. The right lung is clear. Trace left pleural effusion is noted. No pneumothorax. Heart size is upper normal. Aortic atherosclerosis is seen. IMPRESSION: New patchy left basilar airspace disease worrisome for pneumonia. Trace left pleural effusion also noted. Electronically Signed   By: Inge Rise M.D.   On: 11/29/2017 11:10   Dg Chest Port 1 View  Result Date: 11/28/2017 CLINICAL DATA:  Fever to 103.  Unresponsive.  Flu like symptoms. EXAM: PORTABLE CHEST 1 VIEW COMPARISON:  09/08/2016 FINDINGS: Low lung volumes with crowding of interstitial lung markings. No overt pulmonary edema, effusion or pneumothorax. No pulmonary consolidations. Normal size heart. Moderate aortic atherosclerosis along the transverse portion. No aneurysm. Chronic remote right-sided rib  fractures involving the right seventh and eighth ribs with angulated deformity. Right upper quadrant surgical clips presumably from prior cholecystectomy. Osteoarthritis of the left glenohumeral joint with spurring off the humeral head and glenoid. IMPRESSION: Low lung volumes with crowding of interstitial lung markings. No active pulmonary disease. Aortic atherosclerosis. Electronically Signed   By: Ashley Royalty M.D.   On: 11/28/2017 01:12     LOS: 1 day   Oren Binet, MD  [facility] Pager:[phone]  If 7PM-7AM, please contact night-coverage [email or web address] Password [facility] 11/29/2017, 12:06 PM

## 2017-11-30 DIAGNOSIS — J690 Pneumonitis due to inhalation of food and vomit: Secondary | ICD-10-CM

## 2017-11-30 LAB — LEGIONELLA PNEUMOPHILA SEROGP 1 UR AG: L. pneumophila Serogp 1 Ur Ag: NEGATIVE

## 2017-11-30 MED ORDER — AMOXICILLIN-POT CLAVULANATE 875-125 MG PO TABS: 1.0000 | ORAL_TABLET | Freq: Two times a day (BID) | ORAL | Status: DC

## 2017-11-30 MED ORDER — OSELTAMIVIR PHOSPHATE 30 MG PO CAPS: 30.0000 mg | ORAL_CAPSULE | Freq: Two times a day (BID) | ORAL | 0 refills | Status: DC

## 2017-11-30 MED ORDER — IPRATROPIUM-ALBUTEROL 0.5-2.5 (3) MG/3ML IN SOLN: 3.0000 mL | Freq: Four times a day (QID) | RESPIRATORY_TRACT | Status: DC

## 2017-11-30 MED ORDER — GUAIFENESIN ER 600 MG PO TB12: 1200.0000 mg | ORAL_TABLET | Freq: Two times a day (BID) | ORAL | Status: DC

## 2017-11-30 MED ORDER — PREDNISONE 10 MG PO TABS: ORAL_TABLET | ORAL | 0 refills | Status: DC

## 2017-11-30 MED ORDER — PANTOPRAZOLE SODIUM 40 MG PO TBEC: 40.0000 mg | DELAYED_RELEASE_TABLET | Freq: Every day | ORAL | 1 refills | Status: DC

## 2017-11-30 MED ORDER — ALBUTEROL SULFATE (2.5 MG/3ML) 0.083% IN NEBU: 2.5000 mg | INHALATION_SOLUTION | RESPIRATORY_TRACT | 12 refills | Status: DC | PRN

## 2017-11-30 NOTE — Clinical Social Work Placement (Signed)
Patient returning to [facility]. Facility aware of patient's discharge and confirmed patient's ability to return. PTAR contacted, patient's family notiifed. Patient's RN can call report to [phone], packet complete. CSW signing off, no other needs identified at this time.  CLINICAL SOCIAL WORK PLACEMENT  NOTE  Date:  11/30/2017  Patient Details  Name: Kenneth Stark MRN: [id number] Date of Birth: 1925-02-19  Clinical Social Work is seeking post-discharge placement for this patient at the [place] level of care (*CSW will initial, date and re-position this form in  chart as items are completed):  Yes   Patient/family provided with [facility] Work Department's list of facilities offering this level of care within the geographic area requested by the patient (or if unable, by the patient's family).  Yes   Patient/family informed of their freedom to choose among providers that offer the needed level of care, that participate in Medicare, Medicaid or managed care program needed by the patient, have an available bed and are willing to accept the patient.  Yes   Patient/family informed of [facility]'s ownership interest in [facility] and [facility], as well as of the fact that they are under no obligation to receive care at these facilities.  PASRR submitted to EDS on       PASRR number received on       Existing PASRR number confirmed on 11/30/17     FL2 transmitted to all facilities in geographic area requested by pt/family on 11/30/17     FL2 transmitted to all facilities within larger geographic area on       Patient informed that his/her managed care company has contracts with or will negotiate with certain facilities, including the following:            Patient/family informed of bed offers received.  Patient chooses bed at [facility]     Physician recommends and patient chooses bed at      Patient to be transferred to  [facility] on 11/30/17.  Patient to be transferred to facility by PTAR     Patient family notified on 11/30/17 of transfer.  Name of family member notified:  Lorin Glass     PHYSICIAN       Additional Comment:    _______________________________________________ Burnis Medin, LCSW 11/30/2017, 11:08 AM

## 2017-11-30 NOTE — Discharge Summary (Signed)
PATIENT DETAILS Name: Kenneth Stark Age: 84 y.o. Sex: male Date of Birth: Jan 27, 1925 MRN: [id number]. Admitting Physician: Norval Morton, MD AOZ:HYQM, Rexene Edison, DO  Admit Date: 11/28/2017 Discharge date: 11/30/2017  Recommendations for Outpatient Follow-up:  1. Follow up with PCP in 1-2 weeks 2. Please obtain BMP/CBC in one week  Admitted From:  SNF  Disposition: SNF   Home Health: Yes  Equipment/Devices: Has chronic indwelling Foley catheter  Discharge Condition: Stable  CODE STATUS:  DNR  Diet recommendation:  Diet recommendations: Dysphagia 2 (fine chop);Thin liquid        Brief Summary: See H&P, Labs, Consult and Test reports for all details in brief, Patient is a 84 y.o. male with chronic indwelling Foley catheter-prior history of Parkinson's disease with dementia, hypertension, dyslipidemia who presented to the ED with fever, cough, shortness of breath, confusion-he was found to have acute hypoxemic respiratory failure in the setting of influenza and admitted to the hospitalist service.    He was started on broad-spectrum anti-biotics, Tamiflu and rapidly improved.  See below for further details  Brief Hospital Course: Acute hypoxic respiratory failure with sepsis secondary to influenza/aspiration pneumonia: He was acutely ill when he was admitted, very lethargic, overtly aspirating-NG tube was placed and given Tamiflu.  He was also on empiric antimicrobial therapy.  Thankfully he rapidly improved-he initially was accumulating his oral secretions-but he is no longer doing that.  He is much more awake and alert, and in fact was able to answer some of my questions appropriately this morning.  His lung exam has improved-although he still has some congestion.  Since he has rapidly improved-and is able to take oral medications-we will switch him to Tamiflu and Augmentin and discharge him back to his skilled nursing facility.  Blood cultures are negative so far  and will need to be followed until final.  Acute metabolic encephalopathy: Suspect secondary to hypoxia and sepsis.  He has rapidly improved-and seems to be back to his usual baseline.  Dysphagia: Initially appeared to be accumulating oral secretions-given acute illness with influenza and acute debility-he probably aspirated and was treated with broad-spectrum antimicrobial therapy.  With improvement in his hypoxia, and his overall health-he is no longer accumulating secretions.  He was seen by speech therapy and started on a dysphagia 2 diet.  This MD spoke with patient's family-they are aware of aspiration risk.  Continue dysphagia 2 diet at skilled nursing facility, and have speech therapy follow up at SNF as well.   Hypokalemia: Repleted.  Dementia: Continue Namenda and Aricept  Parkinson's disease: Continue Sinemet  History of TIA: Continue antiplatelet agents  Hypertension: Blood pressure is controlled without the use of any antihypertensives-continue to hold all antihypertensives.   Chronic indwelling Foley catheter  Palliative care: DNR in place-frail elderly male with sepsis and acute hypoxic respiratory failure due to influenza-plicated by acute debility/weakness causing aspiration pneumonitis.  He seems to have rapidly improved i with antibiotics and other supportive care.  DNR in place.  Family aware that there was no further room to escalate care in case he deteriorates.  In the future, if he were to get acutely sick-it may be be reasonable to transition him to comfort measures-if family agreeable.  Procedures/Studies: None  Discharge Diagnoses:  Principal Problem:   Sepsis ([place]) Active Problems:   Parkinsonism ([place])   Influenza A   Respiratory failure with hypoxia ([facility])   Chronic indwelling Foley catheter   Hypokalemia   Discharge Instructions:  Activity:  As  tolerated with Full fall precautions use walker/cane & assistance as needed  Discharge  Instructions    Diet - low sodium heart healthy   Complete by:  As directed    Diet recommendations: Dysphagia 2 (fine chop);Thin liquid Liquids provided via: Cup;Straw Medication Administration: Whole meds with puree Supervision: Patient able to self feed;Full supervision/cueing for compensatory strategies;Staff to assist with self feeding Compensations: Minimize environmental distractions;Slow rate;Small sips/bites Postural Changes and/or Swallow Maneuvers: Out of bed for meals;Seated upright 90 degrees;Upright 30-60 min after meal      Increase activity slowly   Complete by:  As directed      Allergies as of 11/30/2017   No Known Allergies     Medication List    STOP taking these medications   guaifenesin 100 MG/5ML syrup Commonly known as:  ROBITUSSIN Replaced by:  guaiFENesin 600 MG 12 hr tablet     TAKE these medications   acetaminophen 325 MG tablet Commonly known as:  TYLENOL Take 650 mg by mouth every 4 (four) hours as needed for mild pain.   albuterol (2.5 MG/3ML) 0.083% nebulizer solution Commonly known as:  PROVENTIL Take 3 mLs (2.5 mg total) by nebulization every 2 (two) hours as needed for wheezing or shortness of breath.   amLODipine 10 MG tablet Commonly known as:  NORVASC Take 1 tablet (10 mg total) by mouth daily.   amoxicillin-clavulanate 875-125 MG tablet Commonly known as:  AUGMENTIN Take 1 tablet by mouth 2 (two) times daily. For 5 more days and then stop   aspirin 81 MG tablet Take 81 mg by mouth daily.   BIOFREEZE 4 % Gel Generic drug:  Menthol (Topical Analgesic) Apply 1 application topically 2 (two) times daily as needed (pain).   Calcium Carb-Cholecalciferol 600-800 MG-UNIT Chew Chew 1 tablet by mouth 2 (two) times daily.   carbidopa-levodopa 25-100 MG tablet Commonly known as:  SINEMET IR Take 1.5 tablets by mouth 3 (three) times daily.   carboxymethylcellulose 0.5 % Soln Commonly known as:  REFRESH PLUS Apply 2 drops to eye as  needed (dry eyes).   clopidogrel 75 MG tablet Commonly known as:  PLAVIX Take 75 mg by mouth daily.   Co-Enzyme Q-10 100 MG Caps Take 100 mg by mouth daily.   escitalopram 10 MG tablet Commonly known as:  LEXAPRO Take 10 mg by mouth daily.   GLUCOSAMINE CHOND COMPLEX/MSM PO Take 1 tablet by mouth 3 (three) times daily.   guaiFENesin 600 MG 12 hr tablet Commonly known as:  MUCINEX Take 2 tablets (1,200 mg total) by mouth 2 (two) times daily. Replaces:  guaifenesin 100 MG/5ML syrup   hydrochlorothiazide 12.5 MG capsule Commonly known as:  MICROZIDE Take 12.5 mg by mouth daily.   ipratropium-albuterol 0.5-2.5 (3) MG/3ML Soln Commonly known as:  DUONEB Take 3 mLs by nebulization every 6 (six) hours.   Lutein-Zeaxanthin 25-5 MG Caps Take 1 capsule by mouth daily.   MULTI-VITAMINS Tabs Take 1 tablet daily by mouth.   NAMZARIC 28-10 MG Cp24 Generic drug:  Memantine HCl-Donepezil HCl Take 1 capsule at bedtime by mouth.   OMEGA 3 PO Take 1,000 mg 2 (two) times daily by mouth.   oseltamivir 30 MG capsule Commonly known as:  TAMIFLU Take 1 capsule (30 mg total) by mouth 2 (two) times daily. For 5 more doses   pantoprazole 40 MG tablet Commonly known as:  PROTONIX Take 1 tablet (40 mg total) by mouth daily.   polyethylene glycol packet Commonly known as:  MIRALAX /  GLYCOLAX Take 17 g by mouth every other day.   predniSONE 10 MG tablet Commonly known as:  DELTASONE Take 4 tablets (40 mg) daily for 2 days, then, Take 3 tablets (30 mg) daily for 2 days, then, Take 2 tablets (20 mg) daily for 2 days, then, Take 1 tablets (10 mg) daily for 1 days, then stop   RA CRANBERRY SUPPLEMENTS PO Take 1 capsule by mouth 2 (two) times daily.   saw palmetto 500 MG capsule Take 500 mg by mouth daily.   senna-docusate 8.6-50 MG tablet Commonly known as:  Senokot-S Take 2 tablets 2 (two) times daily by mouth.   telmisartan 80 MG tablet Commonly known as:  MICARDIS Take 80 mg  daily by mouth.   triamcinolone 55 MCG/ACT Aero nasal inhaler Commonly known as:  NASACORT Place 2 sprays into the nose at bedtime.   vitamin C 500 MG tablet Commonly known as:  ASCORBIC ACID Take 500 mg by mouth daily.      Follow-up Information    Reed, Tiffany L, DO. Schedule an appointment as soon as possible for a visit in 1 week(s).   Specialty:  Geriatric Medicine Contact information: [place] [place] [id number] [phone]          No Known Allergies  Consultations:   None  Other Procedures/Studies: Dg Abd 1 View  Result Date: 11/28/2017 CLINICAL DATA:  Nasogastric tube placement. EXAM: ABDOMEN - 1 VIEW COMPARISON:  None. FINDINGS: Nasogastric tube tip projects in proximal stomach. Included bowel gas pattern is nondilated and nonobstructive. No intra-mass effect. Surgical clips in the included right abdomen compatible with cholecystectomy. Ovoid density in LEFT lower quadrant is likely enteric. IMPRESSION: Nasogastric tube tip projects in proximal stomach. Electronically Signed   By: Elon Alas M.D.   On: 11/28/2017 06:10   Dg Chest Port 1 View  Result Date: 11/29/2017 CLINICAL DATA:  Lethargy and confusion in patient with influenza. EXAM: PORTABLE CHEST 1 VIEW COMPARISON:  Single-view of the chest 11/28/2017 and 09/08/2016. FINDINGS: There is new patchy left basilar airspace disease. The right lung is clear. Trace left pleural effusion is noted. No pneumothorax. Heart size is upper normal. Aortic atherosclerosis is seen. IMPRESSION: New patchy left basilar airspace disease worrisome for pneumonia. Trace left pleural effusion also noted. Electronically Signed   By: Inge Rise M.D.   On: 11/29/2017 11:10   Dg Chest Port 1 View  Result Date: 11/28/2017 CLINICAL DATA:  Fever to 103.  Unresponsive.  Flu like symptoms. EXAM: PORTABLE CHEST 1 VIEW COMPARISON:  09/08/2016 FINDINGS: Low lung volumes with crowding of interstitial lung markings. No overt  pulmonary edema, effusion or pneumothorax. No pulmonary consolidations. Normal size heart. Moderate aortic atherosclerosis along the transverse portion. No aneurysm. Chronic remote right-sided rib fractures involving the right seventh and eighth ribs with angulated deformity. Right upper quadrant surgical clips presumably from prior cholecystectomy. Osteoarthritis of the left glenohumeral joint with spurring off the humeral head and glenoid. IMPRESSION: Low lung volumes with crowding of interstitial lung markings. No active pulmonary disease. Aortic atherosclerosis. Electronically Signed   By: Ashley Royalty M.D.   On: 11/28/2017 01:12     TODAY-DAY OF DISCHARGE:  Subjective:   Kenneth Stark today has no headache,no chest abdominal pain,no new weakness tingling or numbness, feels much better  Objective:   Blood pressure 135/70, pulse 67, temperature 98.2 F (36.8 C), temperature source Oral, resp. rate 16, height 5\' 8"  (1.727 m), weight 74.7 kg (164 lb 10.9 oz),  SpO2 98 %.  Intake/Output Summary (Last 24 hours) at 11/30/2017 0917 Last data filed at 11/30/2017 0753 Gross per 24 hour  Intake 904 ml  Output 1150 ml  Net -246 ml   Filed Weights   11/28/17 0102 11/28/17 0510  Weight: 79.4 kg (175 lb) 74.7 kg (164 lb 10.9 oz)    Exam: Awake Alert,No new F.N deficits, Normal affect [place].AT,PERRAL Supple Neck,No JVD, No cervical lymphadenopathy appriciated.  Symmetrical Chest wall movement, Good air movement bilaterally, some transmitted upper airway sound-congestion is improved. RRR,No Gallops,Rubs or new Murmurs, No Parasternal Heave +ve B.Sounds, Abd Soft, Non tender, No organomegaly appriciated, No rebound -guarding or rigidity. No Cyanosis, Clubbing or edema, No new Rash or bruise   PERTINENT RADIOLOGIC STUDIES: Dg Abd 1 View  Result Date: 11/28/2017 CLINICAL DATA:  Nasogastric tube placement. EXAM: ABDOMEN - 1 VIEW COMPARISON:  None. FINDINGS: Nasogastric tube tip projects in  proximal stomach. Included bowel gas pattern is nondilated and nonobstructive. No intra-mass effect. Surgical clips in the included right abdomen compatible with cholecystectomy. Ovoid density in LEFT lower quadrant is likely enteric. IMPRESSION: Nasogastric tube tip projects in proximal stomach. Electronically Signed   By: Elon Alas M.D.   On: 11/28/2017 06:10   Dg Chest Port 1 View  Result Date: 11/29/2017 CLINICAL DATA:  Lethargy and confusion in patient with influenza. EXAM: PORTABLE CHEST 1 VIEW COMPARISON:  Single-view of the chest 11/28/2017 and 09/08/2016. FINDINGS: There is new patchy left basilar airspace disease. The right lung is clear. Trace left pleural effusion is noted. No pneumothorax. Heart size is upper normal. Aortic atherosclerosis is seen. IMPRESSION: New patchy left basilar airspace disease worrisome for pneumonia. Trace left pleural effusion also noted. Electronically Signed   By: Inge Rise M.D.   On: 11/29/2017 11:10   Dg Chest Port 1 View  Result Date: 11/28/2017 CLINICAL DATA:  Fever to 103.  Unresponsive.  Flu like symptoms. EXAM: PORTABLE CHEST 1 VIEW COMPARISON:  09/08/2016 FINDINGS: Low lung volumes with crowding of interstitial lung markings. No overt pulmonary edema, effusion or pneumothorax. No pulmonary consolidations. Normal size heart. Moderate aortic atherosclerosis along the transverse portion. No aneurysm. Chronic remote right-sided rib fractures involving the right seventh and eighth ribs with angulated deformity. Right upper quadrant surgical clips presumably from prior cholecystectomy. Osteoarthritis of the left glenohumeral joint with spurring off the humeral head and glenoid. IMPRESSION: Low lung volumes with crowding of interstitial lung markings. No active pulmonary disease. Aortic atherosclerosis. Electronically Signed   By: Ashley Royalty M.D.   On: 11/28/2017 01:12     PERTINENT LAB RESULTS: CBC: Recent Labs    11/28/17 0513 11/29/17 0608   WBC 15.4* 9.0  HGB 9.7* 10.5*  HCT 28.1* 31.8*  PLT 199 195   CMET CMP     Component Value Date/Time   NA 143 11/29/2017 0608   NA 145 10/18/2017   K 3.8 11/29/2017 0608   CL 110 11/29/2017 0608   CO2 24 11/29/2017 0608   GLUCOSE 77 11/29/2017 0608   BUN 19 11/29/2017 0608   BUN 23 (A) 10/18/2017   CREATININE 0.85 11/29/2017 0608   CALCIUM 8.3 (L) 11/29/2017 0608   PROT 6.3 (L) 11/28/2017 0026   ALBUMIN 3.1 (L) 11/28/2017 0026   AST 25 11/28/2017 0026   ALT 9 (L) 11/28/2017 0026   ALKPHOS 61 11/28/2017 0026   BILITOT 0.7 11/28/2017 0026   GFRNONAA >60 11/29/2017 0608   GFRAA >60 11/29/2017 0608    GFR Estimated Creatinine  Clearance: 55.9 mL/min (by C-G formula based on SCr of 0.85 mg/dL). No results for input(s): LIPASE, AMYLASE in the last 72 hours. No results for input(s): CKTOTAL, CKMB, CKMBINDEX, TROPONINI in the last 72 hours. Invalid input(s): POCBNP No results for input(s): DDIMER in the last 72 hours. No results for input(s): HGBA1C in the last 72 hours. No results for input(s): CHOL, HDL, LDLCALC, TRIG, CHOLHDL, LDLDIRECT in the last 72 hours. No results for input(s): TSH, T4TOTAL, T3FREE, THYROIDAB in the last 72 hours.  Invalid input(s): FREET3 No results for input(s): VITAMINB12, FOLATE, FERRITIN, TIBC, IRON, RETICCTPCT in the last 72 hours. Coags: No results for input(s): INR in the last 72 hours.  Invalid input(s): PT Microbiology: Recent Results (from the past 240 hour(s))  Blood Culture (routine x 2)     Status: None (Preliminary result)   Collection Time: 11/28/17 12:26 AM  Result Value Ref Range Status   Specimen Description   Final    BLOOD RIGHT ANTECUBITAL Performed at [facility] [address], [place], [place] [address]    Special Requests   Final    BOTTLES DRAWN AEROBIC AND ANAEROBIC Blood Culture adequate volume Performed at [facility] [address] [place], [place] [address]    Culture    Final    NO GROWTH 1 DAY Performed at [facility], [place] [address] [place], [place] [address]    Report Status PENDING  Incomplete  Blood Culture (routine x 2)     Status: None (Preliminary result)   Collection Time: 11/28/17  3:19 AM  Result Value Ref Range Status   Specimen Description   Final    BLOOD LEFT WRIST Performed at [facility] [address] [place], [place] [address]    Special Requests   Final    BOTTLES DRAWN AEROBIC AND ANAEROBIC Blood Culture adequate volume Performed at [facility] [address] [place], [place] [address]    Culture   Final    NO GROWTH 1 DAY Performed at [facility], [place] [address], [place], [place] [address]    Report Status PENDING  Incomplete  MRSA PCR Screening     Status: Abnormal   Collection Time: 11/28/17  5:43 AM  Result Value Ref Range Status   MRSA by PCR POSITIVE (A) NEGATIVE Final    Comment:        The GeneXpert MRSA Assay (FDA approved for NASAL specimens only), is one component of a comprehensive MRSA colonization surveillance program. It is not intended to diagnose MRSA infection nor to guide or monitor treatment for MRSA infections. RESULT CALLED TO, READ BACK BY AND VERIFIED WITH: R.CORKRIN RN [id number] A.[email or web address] Performed at [facility], [place] [address], [place], [place] [address]     FURTHER DISCHARGE INSTRUCTIONS:  Get Medicines reviewed and adjusted: Please take all your medications with you for your next visit with your Primary MD  Laboratory/radiological data: Please request your Primary MD to go over all hospital tests and procedure/radiological results at the follow up, please ask your Primary MD to get all Hospital records sent to his/her office.  In some cases, they will be blood work, cultures and biopsy results pending at the time of your discharge. Please request that your primary care M.D. goes through all the records  of your hospital data and follows up on these results.  Also Note the following: If you experience worsening of your admission symptoms,  develop shortness of breath, life threatening emergency, suicidal or homicidal thoughts you must seek medical attention immediately by calling 911 or calling your MD immediately  if symptoms less severe.  You must read complete instructions/literature along with all the possible adverse reactions/side effects for all the Medicines you take and that have been prescribed to you. Take any new Medicines after you have completely understood and accpet all the possible adverse reactions/side effects.   Do not drive when taking Pain medications or sleeping medications (Benzodaizepines)  Do not take more than prescribed Pain, Sleep and Anxiety Medications. It is not advisable to combine anxiety,sleep and pain medications without talking with your primary care practitioner  Special Instructions: If you have smoked or chewed Tobacco  in the last 2 yrs please stop smoking, stop any regular Alcohol  and or any Recreational drug use.  Wear Seat belts while driving.  Please note: You were cared for by a hospitalist during your hospital stay. Once you are discharged, your primary care physician will handle any further medical issues. Please note that NO REFILLS for any discharge medications will be authorized once you are discharged, as it is imperative that you return to your primary care physician (or establish a relationship with a primary care physician if you do not have one) for your post hospital discharge needs so that they can reassess your need for medications and monitor your lab values.  Total Time spent coordinating discharge including counseling, education and face to face time equals  45 minutes.  SignedOren Binet 11/30/2017 9:17 AM

## 2017-11-30 NOTE — Progress Notes (Signed)
Report called to [facility] at [facility].  Pt assessment unchanged.  Discharged to facility via EMS. Andre Lefort
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
## 2017-11-30 NOTE — Care Management Note (Signed)
Case Management Note  Patient Details  Name: Kenneth Stark MRN: [id number] Date of Birth: 06-05-1925  Subjective/Objective:                    Action/Plan:d/c SNF   Expected Discharge Date:  11/30/17               Expected Discharge Plan:  Skilled Nursing Facility  In-House Referral:  Clinical Social Work  Discharge planning Services  CM Consult  Post Acute Care Choice:    Choice offered to:     DME Arranged:    DME Agency:     HH Arranged:    [place] Agency:     Status of Service:  Completed, signed off  If discussed at [employer], dates discussed:    Additional Comments:  Dessa Phi, RN 11/30/2017, 10:08 AM

## 2017-11-30 NOTE — NC FL2 (Signed)
[place] LEVEL OF CARE SCREENING TOOL     IDENTIFICATION  Patient Name: Kenneth Stark Birthdate: [birth year] Sex: male Admission Date (Current Location): 11/28/2017  [facility] and [place] Number:  Herbalist and Address:  [facility],  [place] [address] [place]      Provider Number: [id number]  Attending Physician Name and Address:  Jonetta Osgood, MD  Relative Name and Phone Number:       Current Level of Care: Hospital Recommended Level of Care: [place] Prior Approval Number:    Date Approved/Denied:   PASRR Number: [id number]  Discharge Plan: SNF    Current Diagnoses: Patient Active Problem List   Diagnosis Date Noted  . Sepsis ([place]) 11/28/2017  . Influenza A 11/28/2017  . Respiratory failure with hypoxia ([place]) 11/28/2017  . Chronic indwelling Foley catheter 11/28/2017  . Hypokalemia 11/28/2017  . Depression, major, single episode, in partial remission ([place]) 10/26/2017  . Constipation 09/03/2017  . Situational depression 07/30/2017  . Parkinsonism ([place]) 04/04/2017  . Sensorineural hearing loss (SNHL), bilateral 02/08/2017  . BPH (benign prostatic hyperplasia) 10/31/2016  . Mixed Alzheimer's and vascular dementia 10/31/2016  . Subarachnoid hemorrhage ([place]) 09/07/2016  . Anemia 09/07/2016  . Subdural hematoma ([place]) 09/01/2016  . Complex laceration of left ear 09/01/2016  . Cerebrovascular disease 09/01/2016  . History of TIA (transient ischemic attack) 09/01/2016  . Essential hypertension   . Sixth nerve palsy   . Cervical spondylosis   . Degenerative joint disease (DJD) of lumbar spine   . Arthritis   . Dyslipidemia   . Gait disorder 11/27/2013    Orientation RESPIRATION BLADDER Height & Weight     Self  O2 Indwelling catheter Weight: 164 lb 10.9 oz (74.7 kg) Height:  5\' 8"  (172.7 cm)  BEHAVIORAL SYMPTOMS/MOOD NEUROLOGICAL BOWEL NUTRITION STATUS        Diet(Dysphagia 2 (fine  chop);Thin liquid)  AMBULATORY STATUS COMMUNICATION OF NEEDS Skin   Total Care Verbally Normal                       Personal Care Assistance Level of Assistance  Bathing, Feeding, Dressing Bathing Assistance: Maximum assistance Feeding assistance: Maximum assistance Dressing Assistance: Maximum assistance     Functional Limitations Info  Sight, Hearing, Speech Sight Info: Adequate Hearing Info: Impaired Speech Info: Adequate    SPECIAL CARE FACTORS FREQUENCY                       Contractures Contractures Info: Not present    Additional Factors Info  Code Status, Allergies, Isolation Precautions Code Status Info: DNR Allergies Info: NKA     Isolation Precautions Info: Droplet and Contact Precautions   Infection: MRSA     Current Medications (11/30/2017):  This is the current hospital active medication list Current Facility-Administered Medications  Medication Dose Route Frequency Provider Last Rate Last Dose  . 0.9 %  sodium chloride infusion   Intravenous Continuous Jonetta Osgood, MD 10 mL/hr at 11/29/17 1229    . acetaminophen (TYLENOL) tablet 650 mg  650 mg Oral Q6H PRN Fuller Plan A, MD   650 mg at 11/29/17 2200   Or  . acetaminophen (TYLENOL) suppository 650 mg  650 mg Rectal Q6H PRN Smith, Rondell A, MD      . Ampicillin-Sulbactam (UNASYN) 3 g in sodium chloride 0.9 % 100 mL IVPB  3 g Intravenous Q8H Ghimire, Henreitta Leber, MD  Stopped at 11/30/17 0400  . aspirin chewable tablet 81 mg  81 mg Oral Daily Jonetta Osgood, MD   81 mg at 11/29/17 1033  . carbidopa-levodopa (SINEMET IR) 25-100 MG per tablet immediate release 1.5 tablet  1.5 tablet Oral TID Jonetta Osgood, MD   1.5 tablet at 11/29/17 2158  . chlorhexidine (PERIDEX) 0.12 % solution 15 mL  15 mL Mouth Rinse BID Jonetta Osgood, MD   15 mL at 11/29/17 2159  . Chlorhexidine Gluconate Cloth 2 % PADS 6 each  6 each Topical Q0600 Jonetta Osgood, MD   6 each at 11/30/17 [phone]  .  clopidogrel (PLAVIX) tablet 75 mg  75 mg Oral Daily Jonetta Osgood, MD   75 mg at 11/29/17 1828  . memantine (NAMENDA XR) 24 hr capsule 28 mg  28 mg Oral QHS Jonetta Osgood, MD   28 mg at 11/29/17 2158   And  . donepezil (ARICEPT) tablet 10 mg  10 mg Oral QHS Jonetta Osgood, MD   10 mg at 11/29/17 2159  . enoxaparin (LOVENOX) injection 40 mg  40 mg Subcutaneous Q24H Smith, Rondell A, MD   40 mg at 11/29/17 1034  . escitalopram (LEXAPRO) tablet 10 mg  10 mg Oral Daily Jonetta Osgood, MD   10 mg at 11/29/17 1033  . ipratropium-albuterol (DUONEB) 0.5-2.5 (3) MG/3ML nebulizer solution 3 mL  3 mL Nebulization Q4H PRN Smith, Rondell A, MD      . ipratropium-albuterol (DUONEB) 0.5-2.5 (3) MG/3ML nebulizer solution 3 mL  3 mL Nebulization TID Jonetta Osgood, MD   3 mL at 11/30/17 0924  . MEDLINE mouth rinse  15 mL Mouth Rinse q12n4p Jonetta Osgood, MD   15 mL at 11/29/17 1558  . methylPREDNISolone sodium succinate (SOLU-MEDROL) 40 mg/mL injection 40 mg  40 mg Intravenous Q8H Ghimire, Henreitta Leber, MD   40 mg at 11/30/17 4854  . mupirocin ointment (BACTROBAN) 2 % 1 application  1 application Nasal BID Jonetta Osgood, MD   1 application at 62/70/35 2200  . ondansetron (ZOFRAN) tablet 4 mg  4 mg Oral Q6H PRN Fuller Plan A, MD       Or  . ondansetron (ZOFRAN) injection 4 mg  4 mg Intravenous Q6H PRN Smith, Rondell A, MD      . oseltamivir (TAMIFLU) capsule 30 mg  30 mg Oral BID Minda Ditto, RPH   30 mg at 11/29/17 2159  . polyethylene glycol (MIRALAX / GLYCOLAX) packet 17 g  17 g Oral Raquel Sarna, MD   Stopped at 11/28/17 1532  . polyvinyl alcohol (LIQUIFILM TEARS) 1.4 % ophthalmic solution 2 drop  2 drop Both Eyes PRN Ghimire, Henreitta Leber, MD      . triamcinolone (NASACORT) nasal inhaler 2 spray  2 spray Nasal QHS Jonetta Osgood, MD   2 spray at 11/29/17 2200     Discharge Medications: Please see discharge summary for a list of discharge  medications.  Relevant Imaging Results:  Relevant Lab Results:   Additional Information SSN [id number]  Burnis Medin, LCSW

## 2017-12-03 ENCOUNTER — Telehealth: Payer: Self-pay

## 2017-12-03 LAB — CULTURE, BLOOD (ROUTINE X 2)
CULTURE: NO GROWTH
Culture: NO GROWTH
SPECIAL REQUESTS: ADEQUATE
Special Requests: ADEQUATE

## 2017-12-03 NOTE — Telephone Encounter (Signed)
Possible re-admission to facility. This is a patient you were seeing at [facility] . [facility] F/U is needed if patient was re-admitted to facility upon discharge. Hospital discharge from [facility] on 11/30/2017

## 2017-12-04 ENCOUNTER — Non-Acute Institutional Stay (SKILLED_NURSING_FACILITY): Payer: Medicare Other | Admitting: Internal Medicine

## 2017-12-04 ENCOUNTER — Encounter: Payer: Self-pay | Admitting: Internal Medicine

## 2017-12-04 DIAGNOSIS — N401 Enlarged prostate with lower urinary tract symptoms: Secondary | ICD-10-CM | POA: Diagnosis not present

## 2017-12-04 DIAGNOSIS — A419 Sepsis, unspecified organism: Secondary | ICD-10-CM | POA: Diagnosis not present

## 2017-12-04 DIAGNOSIS — K5901 Slow transit constipation: Secondary | ICD-10-CM

## 2017-12-04 DIAGNOSIS — I10 Essential (primary) hypertension: Secondary | ICD-10-CM | POA: Diagnosis not present

## 2017-12-04 DIAGNOSIS — J1100 Influenza due to unidentified influenza virus with unspecified type of pneumonia: Secondary | ICD-10-CM

## 2017-12-04 DIAGNOSIS — G214 Vascular parkinsonism: Secondary | ICD-10-CM

## 2017-12-04 DIAGNOSIS — R338 Other retention of urine: Secondary | ICD-10-CM

## 2017-12-04 DIAGNOSIS — J9601 Acute respiratory failure with hypoxia: Secondary | ICD-10-CM

## 2017-12-04 DIAGNOSIS — R5381 Other malaise: Secondary | ICD-10-CM | POA: Diagnosis not present

## 2017-12-04 DIAGNOSIS — I679 Cerebrovascular disease, unspecified: Secondary | ICD-10-CM

## 2017-12-04 DIAGNOSIS — S065X9A Traumatic subdural hemorrhage with loss of consciousness of unspecified duration, initial encounter: Secondary | ICD-10-CM | POA: Diagnosis not present

## 2017-12-04 DIAGNOSIS — S065XAA Traumatic subdural hemorrhage with loss of consciousness status unknown, initial encounter: Secondary | ICD-10-CM

## 2017-12-04 NOTE — Progress Notes (Addendum)
Patient ID: Kenneth Stark, male   DOB: 08-20-1925, 84 y.o.   MRN: [id number]  Provider:  Rexene Edison. Mariea Clonts, D.O., C.M.D. Location:  [place] Room Number: [place] of Service:  SNF (31)  PCP: [name], DO Patient Care Team: [name], DO as PCP - General (Geriatric Medicine) Danella Sensing, MD as Consulting Physician (Dermatology) Sharyne Peach, MD as Consulting Physician (Ophthalmology) Irine Seal, MD as Attending Physician (Urology) [name], MD as Consulting Physician (Neurology)  Extended Emergency Contact Information Primary Emergency Contact: Theurer,[name] Address: [address]          [place] [address] [name] of [place] Phone: [phone] Relation: Spouse Secondary Emergency Contact: [place] of [place] Mobile Phone: [phone] Relation: Daughter  Code Status: DNR Goals of Care: Advanced Directive information Advanced Directives 12/04/2017  Does Patient Have a Medical Advance Directive? Yes  Type of Advance Directive Out of facility DNR (pink MOST or yellow form);[place];Living will  Does patient want to make changes to medical advance directive? No - Patient declined  Copy of [place] in Chart? Yes  Would patient like information on creating a medical advance directive? -  Pre-existing out of facility DNR order (yellow form or pink MOST form) Yellow form placed in chart (order not valid for inpatient use)    Chief Complaint  Patient presents with  . Readmit To SNF    s/p Flu & aspiration pneumonia    HPI: Patient is a 84 y.o. male with h/o prior stroke, vascular parkinsonism, dementia, urinary retention with chronic foley seen today for readmission to [facility] on 11/30/17 after hospitalization from 2/13-2/15/19 at [facility] long with cough, congestion, increased sob, fever to 103.1, hypoxia to 84%, increased lethargy,  tachycardia, tachypnea, leukocytosis.  He was found to have influenza A, aspiration pneumonia, hypokalemia, delirium, and acute respiratory failure.  He was admitted with sepsis protocol.  Final blood cultures just returned negative.  CXR had not shown a clear infiltrate.  He was treated with tamiflu via NGT (due to lethargy and inability to safely swallow at first) and empiric cefepime and vancomycin and admitted to stepdown.  Nebs were ordered QID.  Potassium was repleted.  He was discharged with dysphagia 2 (finely chopped), thin liquids and aspiration precautions on augmentin.  Hospitalist discharge summary indicates that there was no room to escalate therapy further for him if he should decline and suggested a hospice referral should this transpire.    Since returned, he remains a bit more lethargic. He's still quite congested which seems to be worse after meals due to his dysphagia.  He ate very well at breakfast according to his home care aide.  Fortunately, he's not lost weight with this event, but had lost overall over months.    I need to meet with Mrs. Nordstrom when she recovers from the flu to complete a MOST form for [facility].  Past Medical History:  Diagnosis Date  . Abnormality of gait   . Arthritis   . Brain bleed ([place]) 09/01/2016  . Cervical spondylosis   . Degenerative joint disease (DJD) of lumbar spine   . Depression   . Diplopia   . Dyslipidemia   . Essential tremor   . Foul smelling urine   . Frequent falls   . GERD (gastroesophageal reflux disease)   . Glycosuria   . Hearing difficulty    hearing aids  . Hyperlipidemia   .  Hypertension   . Hyperthyroidism   . Memory loss   . Osteoarthritis   . Radiculopathy, lumbar region   . 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 Small vessel disease   . TIA (transient ischemic attack)    Past Surgical History:  Procedure Laterality Date  . APPENDECTOMY     done as a child  . GALLBLADDER SURGERY  2008   . KNEE ARTHROSCOPY Left    Dr. Hart Robinsons 2002  . knee injections Right    Dr. Adriana Mccallum  . TONSILLECTOMY     done as a child    reports that  has never smoked. he has never used smokeless tobacco. He reports that he drinks alcohol. He reports that he does not use drugs. Social History   Socioeconomic History  . Marital status: Married    Spouse name: [name]  . Number of children: 4  . Years of education: Not on file  . Highest education level: Not on file  Social Needs  . Financial resource strain: Not on file  . Food insecurity - worry: Not on file  . Food insecurity - inability: Not on file  . Transportation needs - medical: Not on file  . Transportation needs - non-medical: Not on file  Occupational History  . Occupation: Retired    Comment: [employer]  Tobacco Use  . Smoking status: Never Smoker  . Smokeless tobacco: Never Used  Substance and Sexual Activity  . Alcohol use: Yes    Comment: Wife occass/rare  . Drug use: No  . Sexual activity: Not on file  Other Topics Concern  . Not on file  Social History Narrative   Tobacco use, amount per day now: NEVER   Past tobacco use, amount per day: never    How many years did you use tobacco:   Alcohol use (drinks per week): OCCASIONAL GLASS OF WINE   Diet:HIGH FIBER, LOW FAT   Do you drink/eat things with caffeine:COFFEE IN THE MORNING   Marital status:    MARRIED                             What year were you married? 1951   Do you live in a house, apartment, assisted living, condo, trailer, etc.? Assisted living    Is it one or more stories?   How many persons live in your home?    Do you have pets in your home?( please list) NO   Highest level of education completed: [place] classes    Current or past profession: [employer].   Do you exercise?    Walking    Type and how often? 3 times a week    Do you have a living will? YES   Do you  have a DNR form?  YES                                 If not, do you want to discuss one?   Do you have signed POA/HPOA forms?  YES                      If so, please bring to you appointment      Has difficulty bathing or dressing self  Has difficulty preparing food    Has difficulty managing medications   Has difficulty managing finances   Does not have any difficulty affording medications     Functional Status Survey:  dependent in ADLs   Family History  Problem Relation Age of Onset  . Stroke Mother   . Dementia Mother   . Stroke Father   . Heart disease Father   . Diabetes Father   . Dementia Brother   . Renal Disease Brother   . Diabetes Brother   . Renal Disease Daughter     Health Maintenance  Topic Date Due  . TETANUS/TDAP  09/01/2026  . INFLUENZA VACCINE  Completed  . PNA vac Low Risk Adult  Completed    No Known Allergies  Outpatient Encounter Medications as of 12/04/2017  Medication Sig  . albuterol (PROVENTIL) (2.5 MG/3ML) 0.083% nebulizer solution Take 3 mLs (2.5 mg total) by nebulization every 2 (two) hours as needed for wheezing or shortness of breath.  Marland Kitchen amLODipine (NORVASC) 10 MG tablet Take 1 tablet (10 mg total) by mouth daily.  Marland Kitchen amoxicillin-clavulanate (AUGMENTIN) 875-125 MG tablet Take 1 tablet by mouth 2 (two) times daily. For 5 more days and then stop  . Calcium Carb-Cholecalciferol 600-800 MG-UNIT CHEW Chew 1 tablet by mouth 2 (two) times daily.   . carbidopa-levodopa (SINEMET IR) 25-100 MG tablet Take 1.5 tablets by mouth 3 (three) times daily.  . carboxymethylcellulose (REFRESH PLUS) 0.5 % SOLN Apply 2 drops to eye as needed (dry eyes).   . clopidogrel (PLAVIX) 75 MG tablet Take 75 mg by mouth daily.  Marland Kitchen Co-Enzyme Q-10 100 MG CAPS Take 100 mg by mouth daily.  . Cranberry-Vit C-Lactobacillus (RA CRANBERRY SUPPLEMENTS PO) Take 1 capsule by mouth 2 (two) times daily.  Marland Kitchen escitalopram (LEXAPRO) 10 MG tablet Take 10 mg by mouth daily.  .  hydrochlorothiazide (MICROZIDE) 12.5 MG capsule Take 12.5 mg by mouth daily.  . Lutein-Zeaxanthin 25-5 MG CAPS Take 1 capsule by mouth daily.  Marland Kitchen MELATONIN-CHAMOMILE PO Place 2 tablets under tongue at bedtime as needed  . Memantine HCl-Donepezil HCl (NAMZARIC) 28-10 MG CP24 Take 1 capsule at bedtime by mouth.   . Menthol, Topical Analgesic, (BIOFREEZE) 4 % GEL Apply 1 application topically 2 (two) times daily as needed (pain).   . Misc Natural Products (GLUCOSAMINE CHOND COMPLEX/MSM PO) Take 1 tablet by mouth 3 (three) times daily.   . Multiple Vitamin (MULTI-VITAMINS) TABS Take 1 tablet daily by mouth.   . Omega-3 Fatty Acids (OMEGA 3 PO) Take 1,000 mg 2 (two) times daily by mouth.   . pantoprazole (PROTONIX) 40 MG tablet Take 1 tablet (40 mg total) by mouth daily.  . polyethylene glycol (MIRALAX / GLYCOLAX) packet Take 17 g by mouth every other day.  . predniSONE (DELTASONE) 10 MG tablet Take 4 tablets (40 mg) daily for 2 days, then, Take 3 tablets (30 mg) daily for 2 days, then, Take 2 tablets (20 mg) daily for 2 days, then, Take 1 tablets (10 mg) daily for 1 days, then stop  . saw palmetto 500 MG capsule Take 500 mg by mouth daily.  Marland Kitchen senna-docusate (SENOKOT-S) 8.6-50 MG tablet Take 2 tablets 2 (two) times daily by mouth.   . telmisartan (MICARDIS) 80 MG tablet Take 80 mg daily by mouth.  . triamcinolone (NASACORT) 55 MCG/ACT AERO nasal inhaler Place 2 sprays into the nose at bedtime.  . vitamin C (ASCORBIC ACID) 500 MG tablet Take 500 mg by mouth daily.  . [  DISCONTINUED] acetaminophen (TYLENOL) 325 MG tablet Take 650 mg by mouth every 4 (four) hours as needed for mild pain.  . [DISCONTINUED] aspirin 81 MG tablet Take 81 mg by mouth daily.  . [DISCONTINUED] guaiFENesin (MUCINEX) 600 MG 12 hr tablet Take 2 tablets (1,200 mg total) by mouth 2 (two) times daily.  . [DISCONTINUED] ipratropium-albuterol (DUONEB) 0.5-2.5 (3) MG/3ML SOLN Take 3 mLs by nebulization every 6 (six) hours.  .  [DISCONTINUED] oseltamivir (TAMIFLU) 30 MG capsule Take 1 capsule (30 mg total) by mouth 2 (two) times daily. For 5 more doses   No facility-administered encounter medications on file as of 12/04/2017.     Review of Systems  Constitutional: Positive for activity change and fatigue. Negative for appetite change, chills and fever.  HENT: Positive for congestion, hearing loss and trouble swallowing.   Eyes: Negative for visual disturbance.       Glasses  Respiratory: Positive for cough, shortness of breath and wheezing. Negative for chest tightness.   Cardiovascular: Negative for chest pain, palpitations and leg swelling.  Gastrointestinal: Positive for constipation. Negative for abdominal distention and abdominal pain.  Genitourinary: Negative for dysuria.       Urinary retention with indwelling foley, decreased output so fluids being pushed  Musculoskeletal: Positive for gait problem.  Skin: Negative for color change.  Neurological: Positive for weakness. Negative for dizziness.  Psychiatric/Behavioral: Positive for confusion. Negative for agitation, behavioral problems and suicidal ideas.    Vitals:   12/04/17 0940  BP: 118/62  Pulse: 60  Resp: 20  Temp: 98.6 F (37 C)  TempSrc: Oral  SpO2: 99%  Weight: 165 lb (74.8 kg)   Body mass index is 25.09 kg/m. Physical Exam  Constitutional: No distress.  HENT:  Head: Normocephalic and atraumatic.  Right Ear: External ear normal.  Left Ear: External ear normal.  Nose: Nose normal.  Mouth/Throat: Oropharynx is clear and moist.  Eyes: Conjunctivae are normal. Pupils are equal, round, and reactive to light.  glasses  Neck: Neck supple. No JVD present.  Cardiovascular: Normal rate, regular rhythm, normal heart sounds and intact distal pulses.  Pulmonary/Chest: Effort normal. No respiratory distress. He has wheezes.  Coarse rhonchi anteriorly, wet cough  Abdominal: Soft. Bowel sounds are normal. He exhibits no distension. There is no  tenderness.  Genitourinary:  Genitourinary Comments: Foley in place with amber colored urine  Musculoskeletal: Normal range of motion.  Lymphadenopathy:    He has no cervical adenopathy.  Neurological: He exhibits abnormal muscle tone.  Lethargic, falling asleep every couple of mins  Skin: Skin is warm and dry.  Psychiatric: He has a normal mood and affect.    Labs reviewed: Basic Metabolic Panel: Recent Labs    11/28/17 0026 11/28/17 0513 11/29/17 0608  NA 138 139 143  K 2.8* 2.9* 3.8  CL 100* 105 110  CO2 24 22 24   GLUCOSE 226* 252* 77  BUN 25* 23* 19  CREATININE 1.07 1.04 0.85  CALCIUM 9.2 8.3* 8.3*  MG  --  1.4* 2.0   Liver Function Tests: Recent Labs    11/28/17 0026  AST 25  ALT 9*  ALKPHOS 61  BILITOT 0.7  PROT 6.3*  ALBUMIN 3.1*   No results for input(s): LIPASE, AMYLASE in the last 8760 hours. No results for input(s): AMMONIA in the last 8760 hours. CBC: Recent Labs    11/28/17 0026 11/28/17 0513 11/29/17 0608  WBC 15.9* 15.4* 9.0  NEUTROABS 13.4*  --   --   HGB 11.5*  9.7* 10.5*  HCT 34.0* 28.1* 31.8*  MCV 91.9 91.5 93.0  PLT 215 199 195   Cardiac Enzymes: No results for input(s): CKTOTAL, CKMB, CKMBINDEX, TROPONINI in the last 8760 hours. BNP: Invalid input(s): POCBNP Lab Results  Component Value Date   HGBA1C 6.8 (H) 09/07/2016   Lab Results  Component Value Date   TSH 2.40 08/02/2017   Lab Results  Component Value Date   [id number] 588 12/08/2016   No results found for: FOLATE No results found for: IRON, TIBC, FERRITIN  Imaging and Procedures obtained prior to SNF admission: Dg Abd 1 View  Result Date: 11/28/2017 CLINICAL DATA:  Nasogastric tube placement. EXAM: ABDOMEN - 1 VIEW COMPARISON:  None. FINDINGS: Nasogastric tube tip projects in proximal stomach. Included bowel gas pattern is nondilated and nonobstructive. No intra-mass effect. Surgical clips in the included right abdomen compatible with cholecystectomy. Ovoid density  in LEFT lower quadrant is likely enteric. IMPRESSION: Nasogastric tube tip projects in proximal stomach. Electronically Signed   By: Elon Alas M.D.   On: 11/28/2017 06:10   Dg Chest Port 1 View  Result Date: 11/29/2017 CLINICAL DATA:  Lethargy and confusion in patient with influenza. EXAM: PORTABLE CHEST 1 VIEW COMPARISON:  Single-view of the chest 11/28/2017 and 09/08/2016. FINDINGS: There is new patchy left basilar airspace disease. The right lung is clear. Trace left pleural effusion is noted. No pneumothorax. Heart size is upper normal. Aortic atherosclerosis is seen. IMPRESSION: New patchy left basilar airspace disease worrisome for pneumonia. Trace left pleural effusion also noted. Electronically Signed   By: Inge Rise M.D.   On: 11/29/2017 11:10   Dg Chest Port 1 View  Result Date: 11/28/2017 CLINICAL DATA:  Fever to 103.  Unresponsive.  Flu like symptoms. EXAM: PORTABLE CHEST 1 VIEW COMPARISON:  09/08/2016 FINDINGS: Low lung volumes with crowding of interstitial lung markings. No overt pulmonary edema, effusion or pneumothorax. No pulmonary consolidations. Normal size heart. Moderate aortic atherosclerosis along the transverse portion. No aneurysm. Chronic remote right-sided rib fractures involving the right seventh and eighth ribs with angulated deformity. Right upper quadrant surgical clips presumably from prior cholecystectomy. Osteoarthritis of the left glenohumeral joint with spurring off the humeral head and glenoid. IMPRESSION: Low lung volumes with crowding of interstitial lung markings. No active pulmonary disease. Aortic atherosclerosis. Electronically Signed   By: Ashley Royalty M.D.   On: 11/28/2017 01:12    Assessment/Plan 1. Influenza and pneumonia -finished tamiflu, seems majority of remaining congestion is due to his recurrent aspiration--still completing augmentin therapy and working with [place] not attempt resuscitation (DNR)  2. Acute respiratory failure with  hypoxia ([place]) -much improved, cont with aspiration precautions, completed course of duonebs but continues with albuterol nebs q2h prn, oxygen to keep sats over 90, try to get up into chair bid, completing prednisone taper - Do not attempt resuscitation (DNR)  3. Cerebrovascular disease -with prior subdural hematoma, vascular disease affecting basal ganglia -reamains on some secondary prevention with plavix, bp control with amlodipine, hctz, telmisartan, was taken off baby asa -? Much benefit to his aricept and namenda at this point, especially given his dysphagia - Do not attempt resuscitation (DNR)  4. Urinary retention due to benign prostatic hyperplasia -ongoing, but output was 150cc on last shift so fluids being encouraged (did great at breakfast) -cont foley catheter  - Do not attempt resuscitation (DNR)  5. Essential hypertension -bp at goal with current therapy - Do not attempt resuscitation (DNR)  6. Physical deconditioning -ongoing, very  frail, has definitely taken a downturn with his current illness - Do not attempt resuscitation (DNR)  7. Sepsis, due to unspecified organism ([place]) - blood cultures negative, recovering slowly - Do not attempt resuscitation (DNR)  8. Subdural hematoma ([place]) - historical, led to his declining functional and cognitive status with fall; has now been in skilled care for several months - Do not attempt resuscitation (DNR)  9. 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 Slow transit constipation -cont current bowel regimen  Family/ staff Communication:   Discussed with snf nurse and home care (wife also ill with flu so not present for visit)  Labs/tests ordered:   Orders Placed This Encounter  Procedures  . Do not attempt resuscitation (DNR)    Discussed, scanned copy should be in documents and media    Order Specific Question:   In the event of cardiac or respiratory ARREST     Answer:   Do not call a "code blue"    Order Specific Question:   In the event of cardiac or respiratory ARREST    Answer:   Do not perform Intubation, CPR, defibrillation or ACLS    Order Specific Question:   In the event of cardiac or respiratory ARREST    Answer:   Use medication by any route, position, wound care, and other measures to relive pain and suffering. May use oxygen, suction and manual treatment of airway obstruction as needed for comfort.   Kenneth Stark, D.O. [facility] [address], [place] [address] Cell Phone (Mon-Fri 8am-5pm):  [phone] On Call:  [phone] & follow prompts after 5pm & weekends Office Phone:  [phone] Office Fax:  [phone]

## 2017-12-04 NOTE — Telephone Encounter (Signed)
Seen today. 

## 2017-12-11 DIAGNOSIS — R1312 Dysphagia, oropharyngeal phase: Secondary | ICD-10-CM | POA: Diagnosis not present

## 2017-12-18 DIAGNOSIS — R1312 Dysphagia, oropharyngeal phase: Secondary | ICD-10-CM | POA: Diagnosis not present

## 2018-01-08 ENCOUNTER — Encounter: Payer: Self-pay | Admitting: Internal Medicine

## 2018-01-08 ENCOUNTER — Non-Acute Institutional Stay (SKILLED_NURSING_FACILITY): Payer: Medicare Other | Admitting: Internal Medicine

## 2018-01-08 DIAGNOSIS — R338 Other retention of urine: Secondary | ICD-10-CM

## 2018-01-08 DIAGNOSIS — G309 Alzheimer's disease, unspecified: Secondary | ICD-10-CM

## 2018-01-08 DIAGNOSIS — R5381 Other malaise: Secondary | ICD-10-CM | POA: Diagnosis not present

## 2018-01-08 DIAGNOSIS — G214 Vascular parkinsonism: Secondary | ICD-10-CM

## 2018-01-08 DIAGNOSIS — I10 Essential (primary) hypertension: Secondary | ICD-10-CM | POA: Diagnosis not present

## 2018-01-08 DIAGNOSIS — N401 Enlarged prostate with lower urinary tract symptoms: Secondary | ICD-10-CM

## 2018-01-08 DIAGNOSIS — F0280 Dementia in other diseases classified elsewhere without behavioral disturbance: Secondary | ICD-10-CM | POA: Diagnosis not present

## 2018-01-08 DIAGNOSIS — F0150 Vascular dementia without behavioral disturbance: Secondary | ICD-10-CM

## 2018-01-08 NOTE — Progress Notes (Signed)
Patient ID: Kenneth Stark, male   DOB: 1924/12/14, 84 y.o.   MRN: [id number]  Location:  [facility] Room Number: [place] of Service:  SNF [phone]) Provider: Karen Kays, RN DNP Student / [name], DO  Patient Care Team: [name], DO as PCP - General (Geriatric Medicine) Danella Sensing, MD as Consulting Physician (Dermatology) Sharyne Peach, MD as Consulting Physician (Ophthalmology) Irine Seal, MD as Attending Physician (Urology) [name], MD as Consulting Physician (Neurology)  Extended Emergency Contact Information Primary Emergency Contact: Stocks,[name] Address: [address]          [place] [address] [name] Phone: [phone] Relation: Spouse Secondary Emergency Contact: [place] Mobile Phone: [phone] Relation: Daughter  Code Status:  DNR  Goals of care: Advanced Directive information Advanced Directives 01/08/2018  Does Patient Have a Medical Advance Directive? Yes  Type of Advance Directive Out of facility DNR (pink MOST or yellow form);[place];Living will  Does patient want to make changes to medical advance directive? No - Patient declined  Copy of [place] in Chart? Yes  Would patient like information on creating a medical advance directive? -  Pre-existing out of facility DNR order (yellow form or pink MOST form) Yellow form placed in chart (order not valid for inpatient use)     Chief Complaint  Patient presents with  . Medical Management of Chronic Issues    Routine Visit    HPI:  Pt is a 84 y.o. male seen today for medical management of chronic diseases.   He is found today in his room in his chair with a caregiver present. He is doing well per him. He has no complaints. In Feb he had Flu and aspiration PNA and was hospitalized for this. He is quite, but pleasant in communication. He is  noted to be more tired.  Per Dr Cyndi Lennert note she would like to discuss the MOST form with his wife soon.   Per nursing: He eats well, pills are whole. He has lost 5 pounds in the last month and half. But appetite is still good. He waxes and wanes in confusion, but has no behavior issues. He has a chronic foley which is change every 6 weeks. He is continent of his bowels. He uses a power scooter and wheelchair and requires a lift for transfers. He tried getting him self up and had a fall recently. He was without injury with this. There are no reported skin issues per nurse.   Past Medical History:  Diagnosis Date  . Abnormality of gait   . Arthritis   . Brain bleed ([place] [facility]) 09/01/2016  . Cervical spondylosis   . Degenerative joint disease (DJD) of lumbar spine   . Depression   . Diplopia   . Dyslipidemia   . Essential tremor   . Foul smelling urine   . Frequent falls   . GERD (gastroesophageal reflux disease)   . Glycosuria   . Hearing difficulty    hearing aids  . Hyperlipidemia   . Hypertension   . Hyperthyroidism   . Memory loss   . Osteoarthritis   . Radiculopathy, lumbar region   . 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 Small vessel disease   . TIA (transient ischemic attack)    Past Surgical History:  Procedure Laterality Date  . APPENDECTOMY  done as a child  . GALLBLADDER SURGERY  2008  . KNEE ARTHROSCOPY Left    Dr. Hart Robinsons 2002  . knee injections Right    Dr. Adriana Mccallum  . TONSILLECTOMY     done as a child    No Known Allergies  Outpatient Encounter Medications as of 01/08/2018  Medication Sig  . albuterol (PROVENTIL) (2.5 MG/3ML) 0.083% nebulizer solution Take 3 mLs (2.5 mg total) by nebulization every 2 (two) hours as needed for wheezing or shortness of breath.  Marland Kitchen amLODipine (NORVASC) 10 MG tablet Take 1 tablet (10 mg total) by mouth daily.  Marland Kitchen aspirin EC 81 MG tablet Take 81 mg by mouth daily.  . Calcium Carb-Cholecalciferol  600-800 MG-UNIT CHEW Chew 1 tablet by mouth 2 (two) times daily.   . carbidopa-levodopa (SINEMET IR) 25-100 MG tablet Take 1.5 tablets by mouth 3 (three) times daily.  . carboxymethylcellulose (REFRESH PLUS) 0.5 % SOLN Apply 2 drops to eye as needed (dry eyes).   . clopidogrel (PLAVIX) 75 MG tablet Take 75 mg by mouth daily.  Marland Kitchen Co-Enzyme Q-10 100 MG CAPS Take 100 mg by mouth daily.  . Cranberry-Vit C-Lactobacillus (RA CRANBERRY SUPPLEMENTS PO) Take 1 capsule by mouth 2 (two) times daily.  Marland Kitchen escitalopram (LEXAPRO) 10 MG tablet Take 10 mg by mouth daily.  . hydrochlorothiazide (MICROZIDE) 12.5 MG capsule Take 12.5 mg by mouth daily.  . Lutein-Zeaxanthin 25-5 MG CAPS Take 1 capsule by mouth daily.  . Memantine HCl-Donepezil HCl (NAMZARIC) 28-10 MG CP24 Take 1 capsule at bedtime by mouth.   . Menthol, Topical Analgesic, (BIOFREEZE) 4 % GEL Apply 1 application topically 2 (two) times daily as needed (pain).   . Misc Natural Products (GLUCOSAMINE CHOND COMPLEX/MSM PO) Take 1 tablet by mouth 3 (three) times daily.   . Multiple Vitamin (MULTI-VITAMINS) TABS Take 1 tablet daily by mouth.   . nebivolol (BYSTOLIC) 5 MG tablet Take 5 mg by mouth daily.  . Omega-3 Fatty Acids (OMEGA 3 PO) Take 1,000 mg 2 (two) times daily by mouth.   . pantoprazole (PROTONIX) 40 MG tablet Take 1 tablet (40 mg total) by mouth daily.  . polyethylene glycol (MIRALAX / GLYCOLAX) packet Take 17 g by mouth every other day.  . saw palmetto 500 MG capsule Take 500 mg by mouth daily.  Marland Kitchen senna-docusate (SENOKOT-S) 8.6-50 MG tablet Take 2 tablets 2 (two) times daily by mouth.   . telmisartan (MICARDIS) 80 MG tablet Take 80 mg daily by mouth.  . triamcinolone (NASACORT) 55 MCG/ACT AERO nasal inhaler Place 2 sprays into the nose at bedtime.  . vitamin C (ASCORBIC ACID) 500 MG tablet Take 500 mg by mouth daily.  . [DISCONTINUED] amoxicillin-clavulanate (AUGMENTIN) 875-125 MG tablet Take 1 tablet by mouth 2 (two) times daily. For 5 more  days and then stop  . [DISCONTINUED] MELATONIN-CHAMOMILE PO Place 2 tablets under tongue at bedtime as needed  . [DISCONTINUED] predniSONE (DELTASONE) 10 MG tablet Take 4 tablets (40 mg) daily for 2 days, then, Take 3 tablets (30 mg) daily for 2 days, then, Take 2 tablets (20 mg) daily for 2 days, then, Take 1 tablets (10 mg) daily for 1 days, then stop   No facility-administered encounter medications on file as of 01/08/2018.     Review of Systems  Constitutional: Positive for unexpected weight change. Negative for activity change, appetite change and fatigue.  HENT: Negative.   Eyes:       Glasses  Respiratory: Negative for cough and  shortness of breath.   Cardiovascular: Negative for chest pain, palpitations and leg swelling.  Gastrointestinal: Negative for abdominal distention, abdominal pain, constipation and diarrhea.  Genitourinary:       Urinary retention: indwelling cath   Musculoskeletal: Negative.   Neurological: Positive for tremors. Negative for dizziness.  Psychiatric/Behavioral: Positive for confusion. Negative for behavioral problems and sleep disturbance. The patient is not nervous/anxious.     Immunization History  Administered Date(s) Administered  . Influenza, High Dose Seasonal PF 07/31/2016  . Influenza-Unspecified 08/01/2010, 07/30/2012, 10/16/2012, 10/16/2013, 08/09/2017  . Pneumococcal Polysaccharide-23 02/23/2004, 02/21/2013  . Pneumococcal-Unspecified 03/17/2016  . Td 12/02/2008  . Tdap 09/01/2016  . Zoster 03/28/2006   Pertinent  Health Maintenance Due  Topic Date Due  . INFLUENZA VACCINE  Completed  . PNA vac Low Risk Adult  Completed   Fall Risk  08/21/2017 04/04/2017 02/26/2017  Falls in the past year? No No No   Functional Status Survey:    Vitals:   01/08/18 1215  BP: 128/71  Pulse: (!) 56  Resp: 17  Temp: 98.8 F (37.1 C)  TempSrc: Oral  SpO2: 96%  Weight: 160 lb (72.6 kg)   Body mass index is 24.33 kg/m. Physical Exam    Constitutional: He appears well-developed and well-nourished.  HENT:  Head: Normocephalic.  Cardiovascular: Normal rate, regular rhythm, normal heart sounds and intact distal pulses.  Pulmonary/Chest: Effort normal and breath sounds normal.  Abdominal: Soft. Bowel sounds are normal. He exhibits no distension. There is no tenderness.  Genitourinary:  Genitourinary Comments: Foley in place no signs of infection.  Neurological: He is alert.  Poor muscle tone  Skin: Skin is warm and dry.  Psychiatric: He has a normal mood and affect.    Labs reviewed: Recent Labs    11/28/17 0026 11/28/17 0513 11/29/17 0608  NA 138 139 143  K 2.8* 2.9* 3.8  CL 100* 105 110  CO2 24 22 24   GLUCOSE 226* 252* 77  BUN 25* 23* 19  CREATININE 1.07 1.04 0.85  CALCIUM 9.2 8.3* 8.3*  MG  --  1.4* 2.0   Recent Labs    11/28/17 0026  AST 25  ALT 9*  ALKPHOS 61  BILITOT 0.7  PROT 6.3*  ALBUMIN 3.1*   Recent Labs    11/28/17 0026 11/28/17 0513 11/29/17 0608  WBC 15.9* 15.4* 9.0  NEUTROABS 13.4*  --   --   HGB 11.5* 9.7* 10.5*  HCT 34.0* 28.1* 31.8*  MCV 91.9 91.5 93.0  PLT 215 199 195   Lab Results  Component Value Date   TSH 2.40 08/02/2017   Lab Results  Component Value Date   HGBA1C 6.8 (H) 09/07/2016   Lab Results  Component Value Date   CHOL 127 05/11/2017   HDL 50 05/11/2017   LDLCALC 58 05/11/2017   TRIG 98 05/11/2017    Significant Diagnostic Results in last 30 days:  No results found.  Assessment/Plan  1. Urinary retention due to benign prostatic hyperplasia He has a chronic foley will continue this. Keep with montioring  Fluid intake because at time he does not do well with this.  2. Essential hypertension His BP is stable, will continue norvasc, bystolic, HCTZ  3. Physical deconditioning He is physically deconditioned and requires assistance with most ADL's  Aside from feeding, which he still does on his own.   4. Vascular parkinsonism (HCC) Muscle tone  is poor. He requires a life for transfers now. He is followed by Neurology with this.  5. Mixed Alzheimer's and vascular dementia He has had progressive decline and some waxing and waning for confusion He remains on Namzaric at this time. Unsure how much longer we will continue that treatment.    Family/ staff Communication: spoke with nursing staff  Labs/tests ordered: none

## 2018-01-11 ENCOUNTER — Telehealth: Payer: Self-pay | Admitting: *Deleted

## 2018-01-11 ENCOUNTER — Ambulatory Visit: Payer: Medicare Other | Admitting: Neurology

## 2018-01-11 ENCOUNTER — Encounter: Payer: Self-pay | Admitting: Neurology

## 2018-01-11 VITALS — BP 118/71 | HR 52

## 2018-01-11 DIAGNOSIS — F0280 Dementia in other diseases classified elsewhere without behavioral disturbance: Secondary | ICD-10-CM

## 2018-01-11 DIAGNOSIS — G309 Alzheimer's disease, unspecified: Secondary | ICD-10-CM | POA: Diagnosis not present

## 2018-01-11 DIAGNOSIS — G20 Parkinson's disease: Secondary | ICD-10-CM | POA: Diagnosis not present

## 2018-01-11 DIAGNOSIS — F0150 Vascular dementia without behavioral disturbance: Secondary | ICD-10-CM

## 2018-01-11 NOTE — Progress Notes (Signed)
Reason for visit: Gait disorder, memory disorder  Kenneth Stark is an 84 y.o. male  History of present illness:  Kenneth Stark is a 84 year old right-handed white male with a history of a severe gait disorder and a memory disorder.  The patient recent was in the hospital on 28 November 2017 with influenza A.  The patient recovered fairly well following this.  The patient is in an extended care facility, he has not had any falls, he requires a lift to get him out of bed or get him out of a chair.  The patient is unable to ambulate, he leans backwards when he tries to stand up.  His gait disturbance dramatically worsened over the summer 2018, and began to worsen after a fall with head injury and intracranial hemorrhage that occurred in November 2017 with a left frontal subdural hematoma.  The patient has been treated with Sinemet taking 1.5 tablets of the 25/100 mg Sinemet 3 times daily.  The patient was taken off the medication at one point but the family felt that he stiffened up significantly off the drug.  The patient has had some episodes of confusion off and on.  He is able to eat and drink fairly well, he denies any problems with choking.  He returns for an evaluation.  Past Medical History:  Diagnosis Date  . Abnormality of gait   . Arthritis   . Brain bleed ([place]) 09/01/2016  . Cervical spondylosis   . Degenerative joint disease (DJD) of lumbar spine   . Depression   . Diplopia   . Dyslipidemia   . Essential tremor   . Foul smelling urine   . Frequent falls   . GERD (gastroesophageal reflux disease)   . Glycosuria   . Hearing difficulty    hearing aids  . Hyperlipidemia   . Hypertension   . Hyperthyroidism   . Memory loss   . Osteoarthritis   . Radiculopathy, lumbar region   . 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 Small vessel disease   . TIA (transient ischemic attack)     Past Surgical History:  Procedure Laterality Date  .  APPENDECTOMY     done as a child  . GALLBLADDER SURGERY  2008  . KNEE ARTHROSCOPY Left    Dr. Hart Robinsons 2002  . knee injections Right    Dr. Adriana Mccallum  . TONSILLECTOMY     done as a child    Family History  Problem Relation Age of Onset  . Stroke Mother   . Dementia Mother   . Stroke Father   . Heart disease Father   . Diabetes Father   . Dementia Brother   . Renal Disease Brother   . Diabetes Brother   . Renal Disease Daughter     Social history:  reports that he has never smoked. He has never used smokeless tobacco. He reports that he drinks alcohol. He reports that he does not use drugs.   No Known Allergies  Medications:  Prior to Admission medications   Medication Sig Start Date End Date Taking? Authorizing Provider  albuterol (PROVENTIL) (2.5 MG/3ML) 0.083% nebulizer solution Take 3 mLs (2.5 mg total) by nebulization every 2 (two) hours as needed for wheezing or shortness of breath. 11/30/17  Yes Ghimire, Henreitta Leber, MD  amLODipine (NORVASC) 10 MG tablet Take 1 tablet (10 mg total) by mouth daily. 05/29/17  Yes Reed, [place], DO  aspirin EC 81 MG tablet Take 81 mg by mouth daily.   Yes [provider]  Calcium Carb-Cholecalciferol 600-800 MG-UNIT CHEW Chew 1 tablet by mouth 2 (two) times daily.    Yes [provider]  carbidopa-levodopa (SINEMET IR) 25-100 MG tablet Take 1.5 tablets by mouth 3 (three) times daily. 09/18/17  Yes [name], MD  carboxymethylcellulose (REFRESH PLUS) 0.5 % SOLN Apply 2 drops to eye as needed (dry eyes).    Yes [provider]  clopidogrel (PLAVIX) 75 MG tablet Take 75 mg by mouth daily.   Yes [provider]  Co-Enzyme Q-10 100 MG CAPS Take 100 mg by mouth daily.   Yes [provider]  Cranberry-Vit C-Lactobacillus (RA CRANBERRY SUPPLEMENTS PO) Take 1 capsule by mouth 2 (two) times daily.   Yes [provider]  escitalopram (LEXAPRO) 10 MG tablet Take 10 mg by mouth daily.    Yes [provider]  GLUCOSAMINE-CHONDROITIN-MSM PO Take 1 tablet by mouth daily.   Yes [provider]  hydrochlorothiazide (MICROZIDE) 12.5 MG capsule Take 12.5 mg by mouth daily.   Yes [provider]  Lutein-Zeaxanthin 25-5 MG CAPS Take 1 capsule by mouth daily.   Yes [provider]  Memantine HCl-Donepezil HCl (NAMZARIC) 28-10 MG CP24 Take 1 capsule at bedtime by mouth.    Yes [provider]  Menthol, Topical Analgesic, (BIOFREEZE) 4 % GEL Apply 1 application topically 2 (two) times daily as needed (pain).    Yes [provider]  Multiple Vitamin (MULTI-VITAMINS) TABS Take 1 tablet daily by mouth.    Yes [provider]  nebivolol (BYSTOLIC) 5 MG tablet Take 5 mg by mouth daily.   Yes [provider]  Omega-3 Fatty Acids (OMEGA 3 PO) Take 1,000 mg 2 (two) times daily by mouth.    Yes [provider]  pantoprazole (PROTONIX) 40 MG tablet Take 1 tablet (40 mg total) by mouth daily. 11/30/17 11/30/18 Yes Ghimire, Henreitta Leber, MD  polyethylene glycol (MIRALAX / GLYCOLAX) packet Take 17 g by mouth every other day.   Yes [provider]  saw palmetto 500 MG capsule Take 500 mg by mouth daily.   Yes [provider]  senna-docusate (SENOKOT-S) 8.6-50 MG tablet Take 2 tablets 2 (two) times daily by mouth.    Yes [provider]  telmisartan (MICARDIS) 80 MG tablet Take 80 mg daily by mouth.   Yes [provider]  triamcinolone (NASACORT) 55 MCG/ACT AERO nasal inhaler Place 2 sprays into the nose at bedtime.   Yes [provider]  vitamin C (ASCORBIC ACID) 500 MG tablet Take 500 mg by mouth daily.   Yes [provider]    ROS:  Out of a complete 14 system review of symptoms, the patient complains only of the following symptoms, and all other reviewed systems are negative.  Daytime sleepiness Confusion  Blood pressure 118/71, pulse (!) 52.  Physical  Exam  General: The patient is alert and cooperative at the time of the examination.  Skin: No significant peripheral edema is noted.   Neurologic Exam  Mental status: The patient is alert and oriented x 3 at the time of the examination. The patient has apparent normal recent and remote memory, with an apparently normal attention span and concentration ability.   Cranial nerves: Facial symmetry is present. Speech is normal, no aphasia or dysarthria is noted. Extraocular movements are full. Visual fields are full.  Motor: The patient has good strength in  all 4 extremities.  Sensory examination: Soft touch sensation is symmetric on the face, arms, and legs.  Coordination: The patient has good finger-nose-finger and heel-to-shin bilaterally, but the patient has significant apraxia with use of the extremities.  Gait and station: The patient is unable to ambulate, he is wheelchair-bound, attempts to stand the patient resulted in significant leaning backwards, unable to get upright.  Reflexes: Deep tendon reflexes are symmetric.   Assessment/Plan:  1.  Memory disturbance  2.  Gait disturbance  The patient will continue the Sinemet for now, he remains on Namzeric, he will follow-up in 6 months.  Clinically, he appears to be stable at this point.  Jill Alexanders MD 01/11/2018 12:25 PM  [facility] [address] [place], [place] [address]  Phone [phone] Fax [phone]

## 2018-01-11 NOTE — Telephone Encounter (Signed)
Called [name] at [facility] at [phone]. Advised we saw pt today and she asked we call next time to give her updates. I advised per Dr. Jannifer Franklin note: "The patient will continue the Sinemet for now, he remains on Namzeric, he will follow-up in 6 months.  Clinically, he appears to be stable at this point".   Advised he has 6 month follow up scheduled for 07/25/18 at 11am, check in 1030am. She verbalized understanding and appreciation for call.

## 2018-01-18 DIAGNOSIS — E507 Other ocular manifestations of vitamin A deficiency: Secondary | ICD-10-CM | POA: Diagnosis not present

## 2018-01-18 DIAGNOSIS — E039 Hypothyroidism, unspecified: Secondary | ICD-10-CM | POA: Diagnosis not present

## 2018-01-18 LAB — TSH: TSH: 3.14 (ref 0.41–5.90)

## 2018-01-22 ENCOUNTER — Encounter: Payer: Self-pay | Admitting: Internal Medicine

## 2018-02-04 ENCOUNTER — Encounter: Payer: Self-pay | Admitting: Adult Health

## 2018-02-04 ENCOUNTER — Non-Acute Institutional Stay (SKILLED_NURSING_FACILITY): Payer: Medicare Other | Admitting: Adult Health

## 2018-02-04 DIAGNOSIS — F0280 Dementia in other diseases classified elsewhere without behavioral disturbance: Secondary | ICD-10-CM

## 2018-02-04 DIAGNOSIS — I679 Cerebrovascular disease, unspecified: Secondary | ICD-10-CM | POA: Diagnosis not present

## 2018-02-04 DIAGNOSIS — G309 Alzheimer's disease, unspecified: Secondary | ICD-10-CM

## 2018-02-04 DIAGNOSIS — F4321 Adjustment disorder with depressed mood: Secondary | ICD-10-CM | POA: Diagnosis not present

## 2018-02-04 DIAGNOSIS — N400 Enlarged prostate without lower urinary tract symptoms: Secondary | ICD-10-CM

## 2018-02-04 DIAGNOSIS — I10 Essential (primary) hypertension: Secondary | ICD-10-CM | POA: Diagnosis not present

## 2018-02-04 DIAGNOSIS — F0150 Vascular dementia without behavioral disturbance: Secondary | ICD-10-CM

## 2018-02-04 DIAGNOSIS — D649 Anemia, unspecified: Secondary | ICD-10-CM | POA: Diagnosis not present

## 2018-02-04 DIAGNOSIS — G20 Parkinson's disease: Secondary | ICD-10-CM | POA: Diagnosis not present

## 2018-02-04 DIAGNOSIS — K219 Gastro-esophageal reflux disease without esophagitis: Secondary | ICD-10-CM | POA: Diagnosis not present

## 2018-02-04 DIAGNOSIS — R269 Unspecified abnormalities of gait and mobility: Secondary | ICD-10-CM | POA: Diagnosis not present

## 2018-02-04 NOTE — Progress Notes (Signed)
Provider:   Cindi Carbon, ANP [facility] [phone]  Location: [facility]:  SNF (31)   PCP: [name], DO Patient Care Team: [name], DO as PCP - General (Geriatric Medicine) Danella Sensing, MD as Consulting Physician (Dermatology) Sharyne Peach, MD as Consulting Physician (Ophthalmology) Irine Seal, MD as Attending Physician (Urology) [name], MD as Consulting Physician (Neurology)  Extended Emergency Contact Information Primary Emergency Contact: Likes,[name] Address: [address]          [place] [address] [name] of [place] Phone: [phone] Relation: Spouse Secondary Emergency Contact: [place] of [place] Mobile Phone: [phone] Relation: Daughter  Code Status: DNR Goals of Care: Advanced Directive information Advanced Directives 01/08/2018  Does Patient Have a Medical Advance Directive? Yes  Type of Advance Directive Out of facility DNR (pink MOST or yellow form);[place];Living will  Does patient want to make changes to medical advance directive? No - Patient declined  Copy of [place] in Chart? Yes  Would patient like information on creating a medical advance directive? -  Pre-existing out of facility DNR order (yellow form or pink MOST form) Yellow form placed in chart (order not valid for inpatient use)     Chief Complaint  Patient presents with  . Annual Exam    HPI: Patient is a 84 y.o. male seen today for an annual comprehensive examination. He resides in skilled care due to progressive cognitive and functional losses. There are no complaints regarding his care.   Up to date on vaccines  12/01/17 MMSE 15/30 with failed clock  Functional status: intermittently incontinent of stool, has chronic foley due to retention Uses lift for transfers  Past Medical History:  Diagnosis Date  .  Abnormality of gait   . Arthritis   . Brain bleed ([place]) 09/01/2016  . Cervical spondylosis   . Degenerative joint disease (DJD) of lumbar spine   . Depression   . Diplopia   . Dyslipidemia   . Essential tremor   . Foul smelling urine   . Frequent falls   . GERD (gastroesophageal reflux disease)   . Glycosuria   . Hearing difficulty    hearing aids  . Hyperlipidemia   . Hypertension   . Hyperthyroidism   . Memory loss   . Osteoarthritis   . Radiculopathy, lumbar region   . 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 Small vessel disease ([place])   . TIA (transient ischemic attack)    Past Surgical History:  Procedure Laterality Date  . APPENDECTOMY     done as a child  . GALLBLADDER SURGERY  2008  . KNEE ARTHROSCOPY Left    Dr. Hart Robinsons 2002  . knee injections Right    Dr. Adriana Mccallum  . TONSILLECTOMY     done as a child    reports that he has never smoked. He has never used smokeless tobacco. He reports that he drinks alcohol. He reports that he does not use drugs. Social History   Socioeconomic History  . Marital status: Married    Spouse name: [name]  . Number of children: 4  . Years of education: Not on file  . Highest education level: Not on file  Occupational History  . Occupation: Retired    Comment: [employer]  Social Needs  . Financial resource strain: Not on file  .  Food insecurity:    Worry: Not on file    Inability: Not on file  . Transportation needs:    Medical: Not on file    Non-medical: Not on file  Tobacco Use  . Smoking status: Never Smoker  . Smokeless tobacco: Never Used  Substance and Sexual Activity  . Alcohol use: Yes    Comment: Wife occass/rare  . Drug use: No  . Sexual activity: Not on file  Lifestyle  . Physical activity:    Days per week: Not on file    Minutes per session: Not on file  . Stress: Not on file  Relationships  . Social connections:    Talks on phone: Not on file    Gets  together: Not on file    Attends religious service: Not on file    Active member of club or organization: Not on file    Attends meetings of clubs or organizations: Not on file    Relationship status: Not on file  Other Topics Concern  . Not on file  Social History Narrative   Tobacco use, amount per day now: NEVER   Past tobacco use, amount per day: never    How many years did you use tobacco:   Alcohol use (drinks per week): OCCASIONAL GLASS OF WINE   Diet:HIGH FIBER, LOW FAT   Do you drink/eat things with caffeine:COFFEE IN THE MORNING   Marital status:    MARRIED                             What year were you married? 1951   Do you live in a house, apartment, assisted living, condo, trailer, etc.? Assisted living    Is it one or more stories?   How many persons live in your home?    Do you have pets in your home?( please list) NO   Highest level of education completed: [place] classes    Current or past profession: [employer]   Do you exercise?    Walking    Type and how often? 3 times a week    Do you have a living will? YES   Do you have a DNR form?  YES                                 If not, do you want to discuss one?   Do you have signed POA/HPOA forms?  YES                      If so, please bring to you appointment      Has difficulty bathing or dressing self   Has difficulty preparing food    Has difficulty managing medications   Has difficulty managing finances   Does not have any difficulty affording medications    Family History  Problem Relation Age of Onset  . Stroke Mother   . Dementia Mother   . Stroke Father   . Heart disease Father   . Diabetes Father   . Dementia Brother   . Renal Disease Brother   . Diabetes Brother   . Renal Disease Daughter     Pertinent  Health Maintenance Due  Topic Date Due  . INFLUENZA VACCINE  05/16/2018  . PNA vac Low Risk Adult  Completed  Fall Risk   08/21/2017 04/04/2017 02/26/2017  Falls in the past year? No No No   Depression screen [facility] 2/9 08/21/2017 04/04/2017  Decreased Interest 0 0  Down, Depressed, Hopeless 1 0  PHQ - 2 Score 1 0    Functional Status Survey:    No Known Allergies  Outpatient Encounter Medications as of 02/04/2018  Medication Sig  . albuterol (PROVENTIL) (2.5 MG/3ML) 0.083% nebulizer solution Take 3 mLs (2.5 mg total) by nebulization every 2 (two) hours as needed for wheezing or shortness of breath.  Marland Kitchen amLODipine (NORVASC) 10 MG tablet Take 1 tablet (10 mg total) by mouth daily.  Marland Kitchen aspirin EC 81 MG tablet Take 81 mg by mouth daily.  . Calcium Carb-Cholecalciferol 600-800 MG-UNIT CHEW Chew 1 tablet by mouth 2 (two) times daily.   . carbidopa-levodopa (SINEMET IR) 25-100 MG tablet Take 1.5 tablets by mouth 3 (three) times daily.  . carboxymethylcellulose (REFRESH PLUS) 0.5 % SOLN Apply 2 drops to eye as needed (dry eyes).   . clopidogrel (PLAVIX) 75 MG tablet Take 75 mg by mouth daily.  Marland Kitchen Co-Enzyme Q-10 100 MG CAPS Take 100 mg by mouth daily.  . Cranberry-Vit C-Lactobacillus (RA CRANBERRY SUPPLEMENTS PO) Take 1 capsule by mouth 2 (two) times daily.  Marland Kitchen escitalopram (LEXAPRO) 10 MG tablet Take 10 mg by mouth daily.  Marland Kitchen GLUCOSAMINE-CHONDROITIN-MSM PO Take 1 tablet by mouth daily.  . hydrochlorothiazide (MICROZIDE) 12.5 MG capsule Take 12.5 mg by mouth daily.  . Lutein-Zeaxanthin 25-5 MG CAPS Take 1 capsule by mouth daily.  . Memantine HCl-Donepezil HCl (NAMZARIC) 28-10 MG CP24 Take 1 capsule at bedtime by mouth.   . Menthol, Topical Analgesic, (BIOFREEZE) 4 % GEL Apply 1 application topically 2 (two) times daily as needed (pain).   . Multiple Vitamin (MULTI-VITAMINS) TABS Take 1 tablet daily by mouth.   . nebivolol (BYSTOLIC) 5 MG tablet Take 5 mg by mouth daily.  . Omega-3 Fatty Acids (OMEGA 3 PO) Take 1,000 mg 2 (two) times daily by mouth.   . pantoprazole (PROTONIX) 40 MG tablet Take 1 tablet (40 mg total) by  mouth daily.  . polyethylene glycol (MIRALAX / GLYCOLAX) packet Take 17 g by mouth every other day.  . saw palmetto 500 MG capsule Take 500 mg by mouth daily.  Marland Kitchen senna-docusate (SENOKOT-S) 8.6-50 MG tablet Take 2 tablets 2 (two) times daily by mouth.   . telmisartan (MICARDIS) 80 MG tablet Take 80 mg daily by mouth.  . triamcinolone (NASACORT) 55 MCG/ACT AERO nasal inhaler Place 2 sprays into the nose at bedtime.  . vitamin C (ASCORBIC ACID) 500 MG tablet Take 500 mg by mouth daily.   No facility-administered encounter medications on file as of 02/04/2018.     Review of Systems  Constitutional: Negative for activity change, appetite change, chills, diaphoresis, fatigue, fever and unexpected weight change.  HENT: Negative for congestion.   Eyes: Negative for visual disturbance.  Respiratory: Negative for cough, shortness of breath, wheezing and stridor.   Cardiovascular: Negative for chest pain, palpitations and leg swelling.  Gastrointestinal: Negative for abdominal distention, abdominal pain, constipation and diarrhea.  Genitourinary: Negative for difficulty urinating and dysuria.  Musculoskeletal: Positive for gait problem. Negative for arthralgias, back pain, joint swelling and myalgias.  Skin: Negative for wound.  Neurological: Positive for weakness. Negative for dizziness, seizures, syncope, facial asymmetry, speech difficulty and headaches.  Hematological: Negative for adenopathy. Does not bruise/bleed easily.  Psychiatric/Behavioral: Positive for confusion. Negative for agitation and behavioral problems.  Vitals:   02/04/18 1513  Weight: 169 lb 3.2 oz (76.7 kg)   Body mass index is 25.73 kg/m.  Wt Readings from Last 3 Encounters:  02/04/18 169 lb 3.2 oz (76.7 kg)  01/08/18 160 lb (72.6 kg)  12/04/17 165 lb (74.8 kg)   Physical Exam  Constitutional: He is oriented to person, place, and time. No distress.  HENT:  Head: Normocephalic and atraumatic.  Right Ear: External  ear normal.  Left Ear: External ear normal.  Nose: Nose normal.  Mouth/Throat: Oropharynx is clear and moist. No oropharyngeal exudate.  Eyes: Pupils are equal, round, and reactive to light. Conjunctivae and EOM are normal. Right eye exhibits no discharge. Left eye exhibits no discharge.  Neck: No JVD present. No tracheal deviation present. No thyromegaly present.  Cardiovascular: Normal rate and regular rhythm.  No murmur heard. Pulmonary/Chest: Effort normal and breath sounds normal. No respiratory distress. He has no wheezes.  Abdominal: Soft. Bowel sounds are normal. He exhibits no distension. There is no tenderness.  Musculoskeletal: He exhibits no edema, tenderness or deformity.  Lymphadenopathy:    He has no cervical adenopathy.  Neurological: He is alert and oriented to person, place, and time. No cranial nerve deficit.  Skin: Skin is warm and dry. He is not diaphoretic.  Psychiatric: He has a normal mood and affect.  Nursing note and vitals reviewed.   Labs reviewed: Basic Metabolic Panel: Recent Labs    11/28/17 0026 11/28/17 0513 11/29/17 0608  NA 138 139 143  K 2.8* 2.9* 3.8  CL 100* 105 110  CO2 24 22 24   GLUCOSE 226* 252* 77  BUN 25* 23* 19  CREATININE 1.07 1.04 0.85  CALCIUM 9.2 8.3* 8.3*  MG  --  1.4* 2.0   Liver Function Tests: Recent Labs    11/28/17 0026  AST 25  ALT 9*  ALKPHOS 61  BILITOT 0.7  PROT 6.3*  ALBUMIN 3.1*   No results for input(s): LIPASE, AMYLASE in the last 8760 hours. No results for input(s): AMMONIA in the last 8760 hours. CBC: Recent Labs    11/28/17 0026 11/28/17 0513 11/29/17 0608  WBC 15.9* 15.4* 9.0  NEUTROABS 13.4*  --   --   HGB 11.5* 9.7* 10.5*  HCT 34.0* 28.1* 31.8*  MCV 91.9 91.5 93.0  PLT 215 199 195   Cardiac Enzymes: No results for input(s): CKTOTAL, CKMB, CKMBINDEX, TROPONINI in the last 8760 hours. BNP: Invalid input(s): POCBNP Lab Results  Component Value Date   HGBA1C 6.8 (H) 09/07/2016   Lab  Results  Component Value Date   TSH 3.14 01/18/2018   Lab Results  Component Value Date   [id number] 378 12/08/2016   No results found for: FOLATE No results found for: IRON, TIBC, FERRITIN  Imaging and Procedures obtained recently: No results found.  Assessment/Plan  Mixed Alzheimer's and vascular dementia Followed by neurology and continues on Namzaric. Has had progressive decline in cognition and function over the past few years but is doing well in skilled care.   Essential hypertension Controlled. Continue Norvasc, Bystolic, and HCTZ  Cerebrovascular disease Hx of TIA, currently on aspirin and plavix  BPH (benign prostatic hyperplasia) Led to urinary retention and now has a chronic foley catheter. Maintenance provided by [facility].   Gait disorder Due to progressive decline in function. Uses a lift for transfers and resides in skilled care.  Parkinsonism ([place]) Followed by neurology. No issues with tremor or rigidity. Continue Sinemet.  Anemia Normocytic and normochromic.  Remains  on plavix and aspirin.  Monitored CBC periodically.  GERD (gastroesophageal reflux disease) Controlled, continue protonix 40 mg qd.   Situational depression Previously has had s/s of depression due to moving to skilled care and failing health. He has adjusted well recently and most likely benefits from Lexapro.    Family/ staff Communication: discussed with resident and nurse  Labs/tests ordered: NA

## 2018-02-04 NOTE — Assessment & Plan Note (Signed)
Hx of TIA, currently on aspirin and plavix

## 2018-02-04 NOTE — Assessment & Plan Note (Signed)
Followed by neurology and continues on Namzaric. Has had progressive decline in cognition and function over the past few years but is doing well in skilled care.

## 2018-02-04 NOTE — Assessment & Plan Note (Signed)
Controlled. Continue Norvasc, Bystolic, and HCTZ

## 2018-02-05 DIAGNOSIS — K219 Gastro-esophageal reflux disease without esophagitis: Secondary | ICD-10-CM | POA: Insufficient documentation

## 2018-02-05 NOTE — Assessment & Plan Note (Signed)
Followed by neurology. No issues with tremor or rigidity. Continue Sinemet.

## 2018-02-05 NOTE — Assessment & Plan Note (Signed)
Previously has had s/s of depression due to moving to skilled care and failing health. He has adjusted well recently and most likely benefits from Lexapro.

## 2018-02-05 NOTE — Assessment & Plan Note (Signed)
Controlled, continue protonix 40 mg qd.  

## 2018-02-05 NOTE — Assessment & Plan Note (Addendum)
Normocytic and normochromic.  Remains on plavix and aspirin.  Monitored CBC periodically.

## 2018-02-05 NOTE — Assessment & Plan Note (Signed)
Due to progressive decline in function. Uses a lift for transfers and resides in skilled care.

## 2018-02-05 NOTE — Assessment & Plan Note (Signed)
Led to urinary retention and now has a chronic foley catheter. Maintenance provided by [facility].

## 2018-02-18 DIAGNOSIS — R3914 Feeling of incomplete bladder emptying: Secondary | ICD-10-CM | POA: Diagnosis not present

## 2018-02-18 DIAGNOSIS — Z87440 Personal history of urinary (tract) infections: Secondary | ICD-10-CM | POA: Diagnosis not present

## 2018-02-18 DIAGNOSIS — N401 Enlarged prostate with lower urinary tract symptoms: Secondary | ICD-10-CM | POA: Diagnosis not present

## 2018-03-05 DIAGNOSIS — L0889 Other specified local infections of the skin and subcutaneous tissue: Secondary | ICD-10-CM | POA: Diagnosis not present

## 2018-03-05 DIAGNOSIS — L249 Irritant contact dermatitis, unspecified cause: Secondary | ICD-10-CM | POA: Diagnosis not present

## 2018-03-05 DIAGNOSIS — L821 Other seborrheic keratosis: Secondary | ICD-10-CM | POA: Diagnosis not present

## 2018-03-18 ENCOUNTER — Non-Acute Institutional Stay (SKILLED_NURSING_FACILITY): Payer: Medicare Other | Admitting: Adult Health

## 2018-03-18 ENCOUNTER — Encounter: Payer: Self-pay | Admitting: Adult Health

## 2018-03-18 DIAGNOSIS — L304 Erythema intertrigo: Secondary | ICD-10-CM | POA: Diagnosis not present

## 2018-03-18 DIAGNOSIS — R21 Rash and other nonspecific skin eruption: Secondary | ICD-10-CM | POA: Diagnosis not present

## 2018-03-18 NOTE — Progress Notes (Signed)
Location:  [facility]:  SNF (31) Provider:   Cindi Carbon, ANP [facility] [phone]   [name], DO  Patient Care Team: [name], DO as PCP - General (Geriatric Medicine) Danella Sensing, MD as Consulting Physician (Dermatology) Sharyne Peach, MD as Consulting Physician (Ophthalmology) Irine Seal, MD as Attending Physician (Urology) [name], MD as Consulting Physician (Neurology)  Extended Emergency Contact Information Primary Emergency Contact: Pieratt,[name] Address: [address]          [place] [address] [name] of [place] Phone: [phone] Relation: Spouse Secondary Emergency Contact: [name] of [place] Mobile Phone: [phone] Relation: Daughter  Code Status:  DNR Goals of care: Advanced Directive information Advanced Directives 01/08/2018  Does Patient Have a Medical Advance Directive? Yes  Type of Advance Directive Out of facility DNR (pink MOST or yellow form);[place];Living will  Does patient want to make changes to medical advance directive? No - Patient declined  Copy of [place] in Chart? Yes  Would patient like information on creating a medical advance directive? -  Pre-existing out of facility DNR order (yellow form or pink MOST form) Yellow form placed in chart (order not valid for inpatient use)     Chief Complaint  Patient presents with  . Acute Visit    fluid filled blisters, rash    HPI:  Pt is a 84 y.o. male seen today for an acute visit for fluid filled blisters to the thighs with associated rash. The resident has dementia and so most of the hx is from the chart and the nurse. He was seen by derm on 5/21 for this rash and felt to have a local skin infection and prescribed mupirocin for 10 days and followed by clobetasol 0.05% for two weeks. He has completed the mupirocin but has not  started the clobetasol yet. He has a foley with leg back attachment and the staff wonder if this is an allergic reaction. He is also starting to get a red rash to both arms and had one fluid filled blister on the left forearm which dried up. He remains with fluid filled areas to both thighs. There is mild itching. No fever. No purulent drainage. No other associated symptoms.    Past Medical History:  Diagnosis Date  . Abnormality of gait   . Arthritis   . Brain bleed ([place]) 09/01/2016  . Cervical spondylosis   . Degenerative joint disease (DJD) of lumbar spine   . Depression   . Diplopia   . Dyslipidemia   . Essential tremor   . Foul smelling urine   . Frequent falls   . GERD (gastroesophageal reflux disease)   . Glycosuria   . Hearing difficulty    hearing aids  . Hyperlipidemia   . Hypertension   . Hyperthyroidism   . Memory loss   . Osteoarthritis   . Radiculopathy, lumbar region   . 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 Small vessel disease ([place])   . TIA (transient ischemic attack)    Past Surgical History:  Procedure Laterality Date  . APPENDECTOMY     done as a child  . GALLBLADDER SURGERY  2008  . KNEE ARTHROSCOPY Left    Dr. Hart Robinsons 2002  . knee injections Right    Dr. Adriana Mccallum  . TONSILLECTOMY     done as  a child    No Known Allergies  Outpatient Encounter Medications as of 03/18/2018  Medication Sig  . albuterol (PROVENTIL) (2.5 MG/3ML) 0.083% nebulizer solution Take 3 mLs (2.5 mg total) by nebulization every 2 (two) hours as needed for wheezing or shortness of breath.  Marland Kitchen amLODipine (NORVASC) 10 MG tablet Take 1 tablet (10 mg total) by mouth daily.  Marland Kitchen aspirin EC 81 MG tablet Take 81 mg by mouth daily.  . Calcium Carb-Cholecalciferol 600-800 MG-UNIT CHEW Chew 1 tablet by mouth 2 (two) times daily.   . carbidopa-levodopa (SINEMET IR) 25-100 MG tablet Take 1.5 tablets by mouth 3 (three) times daily.  . carboxymethylcellulose  (REFRESH PLUS) 0.5 % SOLN Apply 2 drops to eye as needed (dry eyes).   . clopidogrel (PLAVIX) 75 MG tablet Take 75 mg by mouth daily.  Marland Kitchen Co-Enzyme Q-10 100 MG CAPS Take 100 mg by mouth daily.  Marland Kitchen escitalopram (LEXAPRO) 10 MG tablet Take 10 mg by mouth daily.  Marland Kitchen GLUCOSAMINE-CHONDROITIN-MSM PO Take 1 tablet by mouth daily.  . hydrochlorothiazide (MICROZIDE) 12.5 MG capsule Take 12.5 mg by mouth daily.  . Lutein-Zeaxanthin 25-5 MG CAPS Take 1 capsule by mouth daily.  . Memantine HCl-Donepezil HCl (NAMZARIC) 28-10 MG CP24 Take 1 capsule at bedtime by mouth.   . Multiple Vitamin (MULTI-VITAMINS) TABS Take 1 tablet daily by mouth.   . nebivolol (BYSTOLIC) 5 MG tablet Take 5 mg by mouth daily.  . Omega-3 Fatty Acids (OMEGA 3 PO) Take 1,000 mg 2 (two) times daily by mouth.   . pantoprazole (PROTONIX) 40 MG tablet Take 1 tablet (40 mg total) by mouth daily.  . polyethylene glycol (MIRALAX / GLYCOLAX) packet Take 17 g by mouth every other day.  . senna-docusate (SENOKOT-S) 8.6-50 MG tablet Take 2 tablets 2 (two) times daily by mouth.   . telmisartan (MICARDIS) 80 MG tablet Take 80 mg daily by mouth.  . triamcinolone (NASACORT) 55 MCG/ACT AERO nasal inhaler Place 2 sprays into the nose at bedtime.  . vitamin C (ASCORBIC ACID) 500 MG tablet Take 500 mg by mouth daily.  . Cranberry-Vit C-Lactobacillus (RA CRANBERRY SUPPLEMENTS PO) Take 1 capsule by mouth 2 (two) times daily.  . Menthol, Topical Analgesic, (BIOFREEZE) 4 % GEL Apply 1 application topically 2 (two) times daily as needed (pain).   . [DISCONTINUED] saw palmetto 500 MG capsule Take 500 mg by mouth daily.   No facility-administered encounter medications on file as of 03/18/2018.     Review of Systems  Unable to perform ROS: Dementia    Immunization History  Administered Date(s) Administered  . Influenza, High Dose Seasonal PF 07/31/2016  . Influenza-Unspecified 08/01/2010, 07/30/2012, 10/16/2012, 10/16/2013, 08/09/2017  . Pneumococcal  Polysaccharide-23 02/23/2004, 02/21/2013  . Pneumococcal-Unspecified 03/17/2016  . Td 12/02/2008  . Tdap 09/01/2016  . Zoster 03/28/2006   Pertinent  Health Maintenance Due  Topic Date Due  . INFLUENZA VACCINE  05/16/2018  . PNA vac Low Risk Adult  Completed   Fall Risk  08/21/2017 04/04/2017 02/26/2017  Falls in the past year? No No No   Functional Status Survey:    Vitals:   03/18/18 1419  BP: (!) 122/50  Pulse: (!) 56  Resp: 20  Temp: 98.3 F (36.8 C)  SpO2: 97%   There is no height or weight on file to calculate BMI. Physical Exam  Constitutional: No distress.  HENT:  Mouth/Throat: Oropharynx is clear and moist.  Cardiovascular: Normal rate and regular rhythm.  No murmur heard. Pulmonary/Chest: Effort normal  and breath sounds normal.  Abdominal: Soft. Bowel sounds are normal.  Genitourinary: Testes normal. Right testis shows no mass, no swelling and no tenderness. Left testis shows no mass, no swelling and no tenderness. Penile erythema present. No penile tenderness.     Neurological: He is alert.  Oriented x 2 and able to f/c  Skin: Rash noted. He is not diaphoretic. There is erythema (maculopapular erythematous rash to both thighs and both arms. Varying sizes of fluid filled blisters to both upper thighs with out purulent drainage. ).  Psychiatric: He has a normal mood and affect.    Labs reviewed: Recent Labs    11/28/17 0026 11/28/17 0513 11/29/17 0608  NA 138 139 143  K 2.8* 2.9* 3.8  CL 100* 105 110  CO2 24 22 24   GLUCOSE 226* 252* 77  BUN 25* 23* 19  CREATININE 1.07 1.04 0.85  CALCIUM 9.2 8.3* 8.3*  MG  --  1.4* 2.0   Recent Labs    11/28/17 0026  AST 25  ALT 9*  ALKPHOS 61  BILITOT 0.7  PROT 6.3*  ALBUMIN 3.1*   Recent Labs    11/28/17 0026 11/28/17 0513 11/29/17 0608  WBC 15.9* 15.4* 9.0  NEUTROABS 13.4*  --   --   HGB 11.5* 9.7* 10.5*  HCT 34.0* 28.1* 31.8*  MCV 91.9 91.5 93.0  PLT 215 199 195   Lab Results  Component  Value Date   TSH 3.14 01/18/2018   Lab Results  Component Value Date   HGBA1C 6.8 (H) 09/07/2016   Lab Results  Component Value Date   CHOL 127 05/11/2017   HDL 50 05/11/2017   LDLCALC 58 05/11/2017   TRIG 98 05/11/2017    Significant Diagnostic Results in last 30 days:  No results found.  Assessment/Plan  1. Bullous rash No new soaps or medications are found Consider bullous pemphigoid or contact dermatitis associated with the catheter bag Staff should use latex free products and avoid a leg bag until his symptoms are sorted out. He should start the clobetasol and monitor for improvement. If not, notify derm.   2. Intertrigo Diflucan 150 mg x 1 dose   Family/ staff Communication: staff/resident  Labs/tests ordered:  NA

## 2018-03-21 DIAGNOSIS — N390 Urinary tract infection, site not specified: Secondary | ICD-10-CM | POA: Diagnosis not present

## 2018-03-21 DIAGNOSIS — Z79899 Other long term (current) drug therapy: Secondary | ICD-10-CM | POA: Diagnosis not present

## 2018-03-21 DIAGNOSIS — R319 Hematuria, unspecified: Secondary | ICD-10-CM | POA: Diagnosis not present

## 2018-04-02 ENCOUNTER — Non-Acute Institutional Stay (SKILLED_NURSING_FACILITY): Payer: Medicare Other | Admitting: Internal Medicine

## 2018-04-02 ENCOUNTER — Encounter: Payer: Self-pay | Admitting: Internal Medicine

## 2018-04-02 DIAGNOSIS — L89622 Pressure ulcer of left heel, stage 2: Secondary | ICD-10-CM

## 2018-04-02 DIAGNOSIS — F4321 Adjustment disorder with depressed mood: Secondary | ICD-10-CM

## 2018-04-02 DIAGNOSIS — G214 Vascular parkinsonism: Secondary | ICD-10-CM

## 2018-04-02 DIAGNOSIS — L249 Irritant contact dermatitis, unspecified cause: Secondary | ICD-10-CM | POA: Diagnosis not present

## 2018-04-02 DIAGNOSIS — R21 Rash and other nonspecific skin eruption: Secondary | ICD-10-CM

## 2018-04-02 DIAGNOSIS — F0280 Dementia in other diseases classified elsewhere without behavioral disturbance: Secondary | ICD-10-CM

## 2018-04-02 DIAGNOSIS — F0150 Vascular dementia without behavioral disturbance: Secondary | ICD-10-CM

## 2018-04-02 DIAGNOSIS — G309 Alzheimer's disease, unspecified: Secondary | ICD-10-CM | POA: Diagnosis not present

## 2018-04-02 DIAGNOSIS — L821 Other seborrheic keratosis: Secondary | ICD-10-CM | POA: Diagnosis not present

## 2018-04-02 DIAGNOSIS — N401 Enlarged prostate with lower urinary tract symptoms: Secondary | ICD-10-CM

## 2018-04-02 DIAGNOSIS — R338 Other retention of urine: Secondary | ICD-10-CM

## 2018-04-02 NOTE — Progress Notes (Signed)
Patient ID: Kenneth Stark, male   DOB: September 18, 1925, 84 y.o.   MRN: [id number]  Location:  [facility] Room Number: [place] of Service:  SNF ([phone]) Provider:   [name], DO  Patient Care Team: Kenneth Curry, DO as PCP - General (Geriatric Medicine) Kenneth Sensing, MD as Consulting Physician (Dermatology) Kenneth Peach, MD as Consulting Physician (Ophthalmology) Kenneth Seal, MD as Attending Physician (Urology) Kenneth Ducking, MD as Consulting Physician (Neurology)  Extended Emergency Contact Information Primary Emergency Contact: Stark,Kenneth Address: [address]          [place] [address] [name] of [place] Phone: [phone] Relation: Spouse Secondary Emergency Contact: Kenneth Stark States of [place] Mobile Phone: [phone] Relation: Daughter  Code Status:  DNR Goals of care: Advanced Directive information Advanced Directives 04/02/2018  Does Patient Have a Medical Advance Directive? Yes  Type of Paramedic of [place];Living will;Out of facility DNR (pink MOST or yellow form)  Does patient want to make changes to medical advance directive? No - Patient declined  Copy of [place] in Chart? Yes  Would patient like information on creating a medical advance directive? -  Pre-existing out of facility DNR order (yellow form or pink MOST form) Yellow form placed in chart (order not valid for inpatient use)     Chief Complaint  Patient presents with  . Medical Management of Chronic Issues    Routine Visit    HPI:  Pt is a 84 y.o. male seen today for medical management of chronic diseases.    Early this month, [name] was treated for a UTI, first with keflex, but it was resistant and changed to cipro.  He had several ulcerations of his legs and groin.  He was first seen by derm end of last month and prescribed mupirocin for 10 days and then clobetasol 0.05%  for two weeks.  NP saw him before the second cream was started and advised to avoid latex products near it and did give diflucan for intertrigo.   Unfortunately, he has also developed heel ulcers that were first noted on 6/17.  He was evaluated by the wound care nurse here yesterday:   "Resident has a stage 2 pressure injury to the left heel. There is a serous filled blister on the left heel that measures 6.0 cm x 3.0 cm. There is also an scabbed area on the right heel that measures 0.5 cm x 0.4 cm. Resident was provided with bilateral heel lift boots to keep heels freely floating at all times. Spoke with dietician to update her on this change. Weight stable at current time and no nutritional interventions needed at this time. "  His wife is aware of his decline.  He does have DNR status.  He is in need of a MOST form.  No mention of the rash was made at today's visit.  Rash said to be resolved.  Affect chronically flat since his fall.  On lexapro for depression.    His wife has concerns that he does not always get well-hydrated so orders have been written for bid oral hydration.  Past Medical History:  Diagnosis Date  . Abnormality of gait   . Arthritis   . Brain bleed ([place]) 09/01/2016  . Cervical spondylosis   . Degenerative joint disease (DJD) of lumbar spine   . Depression   . Diplopia   . Dyslipidemia   . Essential tremor   . Foul  smelling urine   . Frequent falls   . GERD (gastroesophageal reflux disease)   . Glycosuria   . Hearing difficulty    hearing aids  . Hyperlipidemia   . Hypertension   . Hyperthyroidism   . Memory loss   . Osteoarthritis   . Radiculopathy, lumbar region   . 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 Small vessel disease ([place])   . TIA (transient ischemic attack)    Past Surgical History:  Procedure Laterality Date  . APPENDECTOMY     done as a child  . GALLBLADDER SURGERY  2008  . KNEE ARTHROSCOPY Left    Dr. Hart Robinsons 2002  . knee injections Right    Dr. Adriana Mccallum  . TONSILLECTOMY     done as a child    No Known Allergies  Outpatient Encounter Medications as of 04/02/2018  Medication Sig  . albuterol (PROVENTIL) (2.5 MG/3ML) 0.083% nebulizer solution Take 3 mLs (2.5 mg total) by nebulization every 2 (two) hours as needed for wheezing or shortness of breath.  Marland Kitchen amLODipine (NORVASC) 10 MG tablet Take 1 tablet (10 mg total) by mouth daily.  Marland Kitchen aspirin EC 81 MG tablet Take 81 mg by mouth daily.  . Calcium Carb-Cholecalciferol 600-800 MG-UNIT CHEW Chew 1 tablet by mouth 2 (two) times daily.   . carbidopa-levodopa (SINEMET IR) 25-100 MG tablet Take 1.5 tablets by mouth 3 (three) times daily.  . carboxymethylcellulose (REFRESH PLUS) 0.5 % SOLN Apply 2 drops to eye as needed (dry eyes).   . clopidogrel (PLAVIX) 75 MG tablet Take 75 mg by mouth daily.  Marland Kitchen Co-Enzyme Q-10 100 MG CAPS Take 100 mg by mouth daily.  . Cranberry-Vit C-Lactobacillus (RA CRANBERRY SUPPLEMENTS PO) Take 1 capsule by mouth 2 (two) times daily.  Marland Kitchen escitalopram (LEXAPRO) 10 MG tablet Take 10 mg by mouth daily.  Marland Kitchen GLUCOSAMINE-CHONDROITIN-MSM PO Take 1 tablet by mouth daily.  . hydrochlorothiazide (MICROZIDE) 12.5 MG capsule Take 12.5 mg by mouth daily.  . Lutein-Zeaxanthin 25-5 MG CAPS Take 1 capsule by mouth daily.  . Melatonin-Chamomile 3-500 MG-MCG TABS Take 2 tablets by mouth at bedtime as needed (sleep).  . Memantine HCl-Donepezil HCl (NAMZARIC) 28-10 MG CP24 Take 1 capsule at bedtime by mouth.   . Menthol, Topical Analgesic, (BIOFREEZE) 4 % GEL Apply 1 application topically 2 (two) times daily as needed (pain).   . Multiple Vitamin (MULTI-VITAMINS) TABS Take 1 tablet daily by mouth.   . nebivolol (BYSTOLIC) 5 MG tablet Take 5 mg by mouth daily.  . Omega-3 Fatty Acids (OMEGA 3 PO) Take 1,000 mg 2 (two) times daily by mouth.   . pantoprazole (PROTONIX) 40 MG tablet Take 1 tablet (40 mg total) by mouth daily.  . polyethylene glycol  (MIRALAX / GLYCOLAX) packet Take 17 g by mouth every other day.  . senna-docusate (SENOKOT-S) 8.6-50 MG tablet Take 2 tablets 2 (two) times daily by mouth.   . telmisartan (MICARDIS) 80 MG tablet Take 80 mg daily by mouth.  . triamcinolone (NASACORT) 55 MCG/ACT AERO nasal inhaler Place 2 sprays into the nose at bedtime.  . vitamin C (ASCORBIC ACID) 500 MG tablet Take 500 mg by mouth daily.   No facility-administered encounter medications on file as of 04/02/2018.     Review of Systems  Unable to perform ROS: Dementia (see hpi for details of info from nursing)    Immunization History  Administered Date(s) Administered  . Influenza  Inj Mdck Quad Pf 08/09/2017  . Influenza, High Dose Seasonal PF 07/31/2016  . Influenza-Unspecified 08/01/2010, 07/30/2012, 10/16/2012, 10/16/2013  . Pneumococcal Polysaccharide-23 02/23/2004, 02/21/2013  . Pneumococcal-Unspecified 03/17/2016  . Td 12/02/2008  . Tdap 09/01/2016  . Zoster 03/28/2006  . Zoster Recombinat (Shingrix) 10/29/2017, 01/28/2018   Pertinent  Health Maintenance Due  Topic Date Due  . INFLUENZA VACCINE  05/16/2018  . PNA vac Low Risk Adult  Completed   Fall Risk  08/21/2017 04/04/2017 02/26/2017  Falls in the past year? No No No   Functional Status Survey:    Vitals:   04/02/18 1324  BP: (!) 135/56  Pulse: (!) 55  Resp: 18  Temp: (!) 97 F (36.1 C)  TempSrc: Oral  SpO2: 91%  Weight: 168 lb (76.2 kg)  Height: 5\' 8"  (1.727 m)   Body mass index is 25.54 kg/m. Physical Exam  Constitutional: No distress.  HENT:  Head: Normocephalic and atraumatic.  Eyes:  glasses  Cardiovascular: Normal rate, regular rhythm and normal heart sounds.  Pulmonary/Chest: Effort normal and breath sounds normal. No respiratory distress.  Abdominal: Soft. Bowel sounds are normal. He exhibits no distension. There is no tenderness.  Genitourinary:  Genitourinary Comments: Suprapubic catheter with dark yellow to orange urine  Musculoskeletal:    Uses manual wheelchair to get around with high back for support  Neurological:  Awake off and on but falls asleep in between  Skin:  Pressure injury to heels, in boots (see hpi)    Labs reviewed: Recent Labs    11/28/17 0026 11/28/17 0513 11/29/17 0608  NA 138 139 143  K 2.8* 2.9* 3.8  CL 100* 105 110  CO2 24 22 24   GLUCOSE 226* 252* 77  BUN 25* 23* 19  CREATININE 1.07 1.04 0.85  CALCIUM 9.2 8.3* 8.3*  MG  --  1.4* 2.0   Recent Labs    11/28/17 0026  AST 25  ALT 9*  ALKPHOS 61  BILITOT 0.7  PROT 6.3*  ALBUMIN 3.1*   Recent Labs    11/28/17 0026 11/28/17 0513 11/29/17 0608  WBC 15.9* 15.4* 9.0  NEUTROABS 13.4*  --   --   HGB 11.5* 9.7* 10.5*  HCT 34.0* 28.1* 31.8*  MCV 91.9 91.5 93.0  PLT 215 199 195   Lab Results  Component Value Date   TSH 3.14 01/18/2018   Lab Results  Component Value Date   HGBA1C 6.8 (H) 09/07/2016   Lab Results  Component Value Date   CHOL 127 05/11/2017   HDL 50 05/11/2017   LDLCALC 58 05/11/2017   TRIG 98 05/11/2017    Assessment/Plan 1. Pressure injury of left heel, stage 2 -ongoing, cont wound care as mentioned in hpi with offloading of pressure -indicates his poor prognosis  2. Mixed Alzheimer's and vascular dementia -progressive, spends a lot of time sleeping, less talkative and interactive, getting recurrent utis, aspirations, increasingly frail -recommend MOST completion and palliative approach--Kenneth seems to be coming around to this gradually and has completed her own advance directives  3. Bullous rash -resolved with tx per derm  4. 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 Urinary retention due to benign prostatic hyperplasia -cont suprapubic cath with regular changing with proper technique and adequate hydration to help prevent UTIs  6. Situational depression -cont lexapro therapy, didn't respond to remeron in the past  Family/ staff Communication: discussed with  SNF nurse  Labs/tests ordered:  No new  Seymone Forlenza L. Mandeep Ferch, D.O. Geriatrics [employer]  [facility] [address], [place] [address] Cell Phone (Mon-Fri 8am-5pm):  [phone] On Call:  [phone] & follow prompts after 5pm & weekends Office Phone:  [phone] Office Fax:  [phone]

## 2018-04-09 DIAGNOSIS — B351 Tinea unguium: Secondary | ICD-10-CM | POA: Diagnosis not present

## 2018-04-10 DIAGNOSIS — H2513 Age-related nuclear cataract, bilateral: Secondary | ICD-10-CM | POA: Diagnosis not present

## 2018-04-10 DIAGNOSIS — H5203 Hypermetropia, bilateral: Secondary | ICD-10-CM | POA: Diagnosis not present

## 2018-04-10 DIAGNOSIS — H02002 Unspecified entropion of right lower eyelid: Secondary | ICD-10-CM | POA: Diagnosis not present

## 2018-04-19 ENCOUNTER — Non-Acute Institutional Stay (SKILLED_NURSING_FACILITY): Payer: Medicare Other | Admitting: Adult Health

## 2018-04-19 ENCOUNTER — Encounter: Payer: Self-pay | Admitting: Adult Health

## 2018-04-19 DIAGNOSIS — F0280 Dementia in other diseases classified elsewhere without behavioral disturbance: Secondary | ICD-10-CM

## 2018-04-19 DIAGNOSIS — F4321 Adjustment disorder with depressed mood: Secondary | ICD-10-CM | POA: Diagnosis not present

## 2018-04-19 DIAGNOSIS — Z960 Presence of urogenital implants: Secondary | ICD-10-CM

## 2018-04-19 DIAGNOSIS — G309 Alzheimer's disease, unspecified: Secondary | ICD-10-CM | POA: Diagnosis not present

## 2018-04-19 DIAGNOSIS — R310 Gross hematuria: Secondary | ICD-10-CM | POA: Diagnosis not present

## 2018-04-19 DIAGNOSIS — Z978 Presence of other specified devices: Secondary | ICD-10-CM

## 2018-04-19 DIAGNOSIS — L2489 Irritant contact dermatitis due to other agents: Secondary | ICD-10-CM

## 2018-04-19 DIAGNOSIS — Z79899 Other long term (current) drug therapy: Secondary | ICD-10-CM | POA: Diagnosis not present

## 2018-04-19 DIAGNOSIS — R319 Hematuria, unspecified: Secondary | ICD-10-CM | POA: Diagnosis not present

## 2018-04-19 DIAGNOSIS — F0150 Vascular dementia without behavioral disturbance: Secondary | ICD-10-CM | POA: Diagnosis not present

## 2018-04-19 DIAGNOSIS — N390 Urinary tract infection, site not specified: Secondary | ICD-10-CM | POA: Diagnosis not present

## 2018-04-19 NOTE — Progress Notes (Signed)
Location:  [facility] of Service:  SNF (31) Provider:  Cindi Carbon, ANP [place] [phone]   [name], DO  Patient Care Team: [name], DO as PCP - General (Geriatric Medicine) Danella Sensing, MD as Consulting Physician (Dermatology) Sharyne Peach, MD as Consulting Physician (Ophthalmology) Irine Seal, MD as Attending Physician (Urology) [name], MD as Consulting Physician (Neurology)  Extended Emergency Contact Information Primary Emergency Contact: Guerry,[name] Address: [address]          [place] [address] [name] of [place] Phone: [phone] Relation: Spouse Secondary Emergency Contact: [name] States of [place] Mobile Phone: [phone] Relation: Daughter  Code Status:  DNR Goals of care: Advanced Directive information Advanced Directives 04/02/2018  Does Patient Have a Medical Advance Directive? Yes  Type of Paramedic of [place];Living will;Out of facility DNR (pink MOST or yellow form)  Does patient want to make changes to medical advance directive? No - Patient declined  Copy of [place] in Chart? Yes  Would patient like information on creating a medical advance directive? -  Pre-existing out of facility DNR order (yellow form or pink MOST form) Yellow form placed in chart (order not valid for inpatient use)     Chief Complaint  Patient presents with  . Acute Visit    blisters, depression, hematuria    HPI:  Pt is a 84 y.o. male seen today for blisters, depression, hematuria. He resides  In skilled care due to progressive decline in function and cognition associated with mixed vascular/alzheimer's related dementia.   Depression: since moving to AL and then later transitioning to skilled care he has experienced chronic sadness with tearful episodes per his wife. She feels he is depressed, he agrees. He  had difficulty relaying information due to his dementia but did confirm that he was sad, denied any difficulty sleeping or appetite.   Nurse reports blisters to his left thigh with red rash where leg strap was applied to his thigh for the catheter. He was treated for blisters to both thighs with prednisone last month which resolved. ?if this was contact dermatitis vs bullous pemphigoid. He is followed by dermatology and saw them on 6/18 but at that point the symptoms had resolved.   Hematuria: present off and on for a few days. Also present last month and he was found to have a UTI and treated with Cipro. The nurse reports that his meatus has been widening since the catheter was placed. He denies any abd pain, fever, chills, dysuria, etc. Hx of BPH with retention which led to infections and hematuria in the past.   He is on aspirin and plavix due to a hx of mixed dementia and TIA.  Past Medical History:  Diagnosis Date  . Abnormality of gait   . Arthritis   . Brain bleed ([place]) 09/01/2016  . Cervical spondylosis   . Degenerative joint disease (DJD) of lumbar spine   . Depression   . Diplopia   . Dyslipidemia   . Essential tremor   . Foul smelling urine   . Frequent falls   . GERD (gastroesophageal reflux disease)   . Glycosuria   . Hearing difficulty    hearing aids  . Hyperlipidemia   . Hypertension   . Hyperthyroidism   . Memory loss   . Osteoarthritis   . Radiculopathy, lumbar region   . 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 Small vessel disease ([place])   . TIA (transient ischemic attack)    Past Surgical History:  Procedure Laterality Date  . APPENDECTOMY     done as a child  . GALLBLADDER SURGERY  2008  . KNEE ARTHROSCOPY Left    Dr. Hart Robinsons 2002  . knee injections Right    Dr. Adriana Mccallum  . TONSILLECTOMY     done as a child    No Known Allergies  Outpatient Encounter Medications as of 04/19/2018  Medication Sig  . albuterol  (PROVENTIL) (2.5 MG/3ML) 0.083% nebulizer solution Take 3 mLs (2.5 mg total) by nebulization every 2 (two) hours as needed for wheezing or shortness of breath.  Marland Kitchen amLODipine (NORVASC) 10 MG tablet Take 1 tablet (10 mg total) by mouth daily.  Marland Kitchen aspirin EC 81 MG tablet Take 81 mg by mouth daily.  . Calcium Carb-Cholecalciferol 600-800 MG-UNIT CHEW Chew 1 tablet by mouth 2 (two) times daily.   . carbidopa-levodopa (SINEMET IR) 25-100 MG tablet Take 1.5 tablets by mouth 3 (three) times daily.  . carboxymethylcellulose (REFRESH PLUS) 0.5 % SOLN Apply 2 drops to eye as needed (dry eyes).   . clopidogrel (PLAVIX) 75 MG tablet Take 75 mg by mouth daily.  Marland Kitchen Co-Enzyme Q-10 100 MG CAPS Take 100 mg by mouth daily.  . Cranberry-Vit C-Lactobacillus (RA CRANBERRY SUPPLEMENTS PO) Take 1 capsule by mouth 2 (two) times daily.  Marland Kitchen escitalopram (LEXAPRO) 10 MG tablet Take 10 mg by mouth daily.  Marland Kitchen GLUCOSAMINE-CHONDROITIN-MSM PO Take 1 tablet by mouth daily.  . hydrochlorothiazide (MICROZIDE) 12.5 MG capsule Take 12.5 mg by mouth daily.  . Lutein-Zeaxanthin 25-5 MG CAPS Take 1 capsule by mouth daily.  . Melatonin-Chamomile 3-500 MG-MCG TABS Take 2 tablets by mouth at bedtime as needed (sleep).  . Memantine HCl-Donepezil HCl (NAMZARIC) 28-10 MG CP24 Take 1 capsule at bedtime by mouth.   . Menthol, Topical Analgesic, (BIOFREEZE) 4 % GEL Apply 1 application topically 2 (two) times daily as needed (pain).   . Multiple Vitamin (MULTI-VITAMINS) TABS Take 1 tablet daily by mouth.   . nebivolol (BYSTOLIC) 5 MG tablet Take 5 mg by mouth daily.  . Omega-3 Fatty Acids (OMEGA 3 PO) Take 1,000 mg 2 (two) times daily by mouth.   . pantoprazole (PROTONIX) 40 MG tablet Take 1 tablet (40 mg total) by mouth daily.  . polyethylene glycol (MIRALAX / GLYCOLAX) packet Take 17 g by mouth every other day.  . senna-docusate (SENOKOT-S) 8.6-50 MG tablet Take 2 tablets 2 (two) times daily by mouth.   . telmisartan (MICARDIS) 80 MG tablet Take  80 mg daily by mouth.  . triamcinolone (NASACORT) 55 MCG/ACT AERO nasal inhaler Place 2 sprays into the nose at bedtime.  . vitamin C (ASCORBIC ACID) 500 MG tablet Take 500 mg by mouth daily.   No facility-administered encounter medications on file as of 04/19/2018.     Review of Systems  Constitutional: Negative for activity change, appetite change, chills, diaphoresis, fever and unexpected weight change.  HENT: Negative for trouble swallowing.   Eyes: Negative for visual disturbance.  Respiratory: Negative for cough, choking and shortness of breath.   Cardiovascular: Negative for chest pain, palpitations and leg swelling.  Gastrointestinal: Negative for abdominal distention, abdominal pain, blood in stool, constipation, diarrhea and nausea.  Genitourinary: Positive for hematuria. Negative for discharge, dysuria, enuresis, flank pain, frequency, penile pain, penile swelling, scrotal swelling and testicular pain.  Musculoskeletal: Positive for gait  problem. Negative for arthralgias and back pain.  Skin: Positive for wound (skin tears to right foot, pressure ulcers healing to both heels).  Neurological: Negative for dizziness, tremors, syncope and facial asymmetry.  Psychiatric/Behavioral: Positive for confusion and dysphoric mood. Negative for agitation, behavioral problems, hallucinations, self-injury, sleep disturbance and suicidal ideas. The patient is not nervous/anxious and is not hyperactive.     Immunization History  Administered Date(s) Administered  . Influenza Inj Mdck Quad Pf 08/09/2017  . Influenza, High Dose Seasonal PF 07/31/2016  . Influenza-Unspecified 08/01/2010, 07/30/2012, 10/16/2012, 10/16/2013  . Pneumococcal Polysaccharide-23 02/23/2004, 02/21/2013  . Pneumococcal-Unspecified 03/17/2016  . Td 12/02/2008  . Tdap 09/01/2016  . Zoster 03/28/2006  . Zoster Recombinat (Shingrix) 10/29/2017, 01/28/2018   Pertinent  Health Maintenance Due  Topic Date Due  . INFLUENZA  VACCINE  05/16/2018  . PNA vac Low Risk Adult  Completed   Fall Risk  08/21/2017 04/04/2017 02/26/2017  Falls in the past year? No No No   Functional Status Survey:    Vitals:   04/19/18 1224  Weight: 164 lb 9.6 oz (74.7 kg)   Body mass index is 25.03 kg/m. Physical Exam  Constitutional: No distress.  HENT:  Head: Normocephalic and atraumatic.  Cardiovascular: Normal rate and regular rhythm.  No murmur heard. Pulmonary/Chest: Effort normal and breath sounds normal.  Abdominal: Soft. Bowel sounds are normal. He exhibits no distension. There is no tenderness. There is no guarding.  NO CVA tenderness  Lymphadenopathy:    He has no cervical adenopathy.  Neurological: He is alert.  Oriented to self and place but not time.   Skin: Skin is warm and dry. He is not diaphoretic.  Skin tears to 2nd and 3rd toe on the right with 100% pink tissue, no redness or drainage. Healing bilateral pressure areas to both heels. No tenderness or drainage or redness. Left thigh with linear macular rash and one fluid filled blister on the left.  Psychiatric:  Cried during the interview, then fell asleep    Labs reviewed: Recent Labs    11/28/17 0026 11/28/17 0513 11/29/17 0608  NA 138 139 143  K 2.8* 2.9* 3.8  CL 100* 105 110  CO2 24 22 24   GLUCOSE 226* 252* 77  BUN 25* 23* 19  CREATININE 1.07 1.04 0.85  CALCIUM 9.2 8.3* 8.3*  MG  --  1.4* 2.0   Recent Labs    11/28/17 0026  AST 25  ALT 9*  ALKPHOS 61  BILITOT 0.7  PROT 6.3*  ALBUMIN 3.1*   Recent Labs    11/28/17 0026 11/28/17 0513 11/29/17 0608  WBC 15.9* 15.4* 9.0  NEUTROABS 13.4*  --   --   HGB 11.5* 9.7* 10.5*  HCT 34.0* 28.1* 31.8*  MCV 91.9 91.5 93.0  PLT 215 199 195   Lab Results  Component Value Date   TSH 3.14 01/18/2018   Lab Results  Component Value Date   HGBA1C 6.8 (H) 09/07/2016   Lab Results  Component Value Date   CHOL 127 05/11/2017   HDL 50 05/11/2017   LDLCALC 58 05/11/2017   TRIG 98  05/11/2017    Significant Diagnostic Results in last 30 days:  No results found.  Assessment/Plan  1. Situational depression Recurrent  Discontinue lexapro and start Zoloft 25 mg qhs, increase to 50 mg qhs after four weeks  2. Gross hematuria Check UA C and S and treat accordingly Does not appear acutely ill Consider referral back to urology as this is a  recurrent problem   3. Irritant contact dermatitis due to other agents D/C leg bag use Start clobetasol 0.05% BID x 10 days to left thigh  4. Mixed Alzheimer's and vascular dementia With hx of TIA Remains on plavix and asa and followed by Dr. Jannifer Franklin Will have staff contact there office to see if we could discontinued either the aspirin or plavix which may help with the hematuria.   5. 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 If he will need suprapubic I discussed his care with his wife and it is recommended that he follow back up with urology (Dr Jeffie Pollock) regarding these concerns.    Family/ staff Communication: [name] (his wife)  Labs/tests ordered:  UA C and S

## 2018-04-22 ENCOUNTER — Telehealth: Payer: Self-pay | Admitting: *Deleted

## 2018-04-22 NOTE — Telephone Encounter (Signed)
Took call from phone staff and spoke with [place] from [facility]. She is calling because pt having hematuria in catheter. He is on ASA and plavix. Currently being treated for UTI with Cipro. Started 7 day course yesterday. He was previously treated for UTI back on 03/23/18.   They are wanting to know if Dr. Jannifer Franklin wants to adjust ASA or plavix d/t hematuria. Advised he is out for the next couple weeks. I will speak with WID and call back to advise on how to proceed.  [name] number: [phone]

## 2018-04-22 NOTE — Telephone Encounter (Signed)
Received the following VO from Dr. Felecia Shelling: continue to monitor hematuria for the next five days while pt on cipro for UTI. If hematuria persists, he recommends going to Plavix every other day

## 2018-04-22 NOTE — Telephone Encounter (Signed)
I called Kenneth Stark back. Relayed orders per Dr. Felecia Shelling. She verbalized understanding.  I also recommended they f/u with urology. She believes they are aware of  Hematuria but she will have him f/u with urology as well.

## 2018-04-30 DIAGNOSIS — S70312A Abrasion, left thigh, initial encounter: Secondary | ICD-10-CM | POA: Diagnosis not present

## 2018-04-30 DIAGNOSIS — L249 Irritant contact dermatitis, unspecified cause: Secondary | ICD-10-CM | POA: Diagnosis not present

## 2018-04-30 DIAGNOSIS — L853 Xerosis cutis: Secondary | ICD-10-CM | POA: Diagnosis not present

## 2018-05-07 ENCOUNTER — Telehealth: Payer: Self-pay | Admitting: Neurology

## 2018-05-07 NOTE — Telephone Encounter (Signed)
[name] called back from [facility]. I relayed message below. She took VO and will update this in patient's chart. Nothing further needed.

## 2018-05-07 NOTE — Telephone Encounter (Signed)
Called, LVM for [name] asking her to call back.  Patient takes Sinemet 25-100mg  tab 1.5 tabs TID and he should take each dose 30 min prior to eating as this can mess with the effectiveness of the medication if not given 60min prior.

## 2018-05-07 NOTE — Telephone Encounter (Signed)
[name] with [facility] calling to clarify carbidopa-levodopa (SINEMET IR) 25-100 MG tablet. Should the patient take tid or 30 minutes before meal. Please call and advise. [name] can be reached at [phone].

## 2018-05-13 ENCOUNTER — Non-Acute Institutional Stay (SKILLED_NURSING_FACILITY): Payer: Medicare Other | Admitting: Adult Health

## 2018-05-13 DIAGNOSIS — G309 Alzheimer's disease, unspecified: Secondary | ICD-10-CM

## 2018-05-13 DIAGNOSIS — L139 Bullous disorder, unspecified: Secondary | ICD-10-CM

## 2018-05-13 DIAGNOSIS — F0150 Vascular dementia without behavioral disturbance: Secondary | ICD-10-CM

## 2018-05-13 DIAGNOSIS — I679 Cerebrovascular disease, unspecified: Secondary | ICD-10-CM | POA: Diagnosis not present

## 2018-05-13 DIAGNOSIS — F4321 Adjustment disorder with depressed mood: Secondary | ICD-10-CM

## 2018-05-13 DIAGNOSIS — N401 Enlarged prostate with lower urinary tract symptoms: Secondary | ICD-10-CM | POA: Diagnosis not present

## 2018-05-13 DIAGNOSIS — D649 Anemia, unspecified: Secondary | ICD-10-CM | POA: Diagnosis not present

## 2018-05-13 DIAGNOSIS — R338 Other retention of urine: Secondary | ICD-10-CM

## 2018-05-13 DIAGNOSIS — F0280 Dementia in other diseases classified elsewhere without behavioral disturbance: Secondary | ICD-10-CM

## 2018-05-13 NOTE — Progress Notes (Signed)
.  Location:  [facility] of Service:  SNF (31) Provider:   Cindi Carbon, ANP [place] [phone]   [name], DO  Patient Care Team: [name], DO as PCP - General (Geriatric Medicine) Danella Sensing, MD as Consulting Physician (Dermatology) Sharyne Peach, MD as Consulting Physician (Ophthalmology) Irine Seal, MD as Attending Physician (Urology) [name], MD as Consulting Physician (Neurology)  Extended Emergency Contact Information Primary Emergency Contact: Dant,[name] Address: [address]          [place] [address] [name] of [place] Phone: [phone] Relation: Spouse Secondary Emergency Contact: [name] States of [place] Mobile Phone: [phone] Relation: Daughter  Code Status:  DNR Goals of care: Advanced Directive information Advanced Directives 04/02/2018  Does Patient Have a Medical Advance Directive? Yes  Type of Paramedic of [place];Living will;Out of facility DNR (pink MOST or yellow form)  Does patient want to make changes to medical advance directive? No - Patient declined  Copy of [place] in Chart? Yes  Would patient like information on creating a medical advance directive? -  Pre-existing out of facility DNR order (yellow form or pink MOST form) Yellow form placed in chart (order not valid for inpatient use)     Chief Complaint  Patient presents with  . Medical Management of Chronic Issues    HPI:  Pt is a 84 y.o. male seen today for medical management of chronic diseases.    Depression: started on zoloft on 7/2 due to depressive symptoms No reports of crying or anxiety per nurse. No s/e noted. Due to his dementia it is difficult to assess  Mixed AD/Vascular dementia: MMSE 15/30 on 12/01/17 On namzaric, followed by neurology.    BPH with chronic indwelling foley: hx of hematuria with recurrent  UTIs. Treated with Cipro 3 weeks ago (culture grew serratia). No further bleeding, fever, etc.   Anemia: Lab Results  Component Value Date   HGB 10.5 (L) 11/29/2017    BPs are elevated at times on the automatic cuff but tend to be WNL on the manual cuff  He has had issues with blisters and was seen by dermatology with triamcinolone ordered which has helped. Only one small area is noted to the left thigh. Differential was an irritant dermatitis or bullous pemphigoid.    Past Medical History:  Diagnosis Date  . Abnormality of gait   . Arthritis   . Brain bleed ([place]) 09/01/2016  . Cervical spondylosis   . Degenerative joint disease (DJD) of lumbar spine   . Depression   . Diplopia   . Dyslipidemia   . Essential tremor   . Foul smelling urine   . Frequent falls   . GERD (gastroesophageal reflux disease)   . Glycosuria   . Hearing difficulty    hearing aids  . Hyperlipidemia   . Hypertension   . Hyperthyroidism   . Memory loss   . Osteoarthritis   . Radiculopathy, lumbar region   . 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 Small vessel disease ([place])   . TIA (transient ischemic attack)    Past Surgical History:  Procedure Laterality Date  . APPENDECTOMY     done as a child  . GALLBLADDER SURGERY  2008  . KNEE ARTHROSCOPY Left    Dr. Hart Robinsons 2002  . knee injections Right    Dr. Catalina Antigua  [facility]  . TONSILLECTOMY     done as a child    No Known Allergies  Outpatient Encounter Medications as of 05/13/2018  Medication Sig  . Cranberry-Vit C-Lactobacillus (RA CRANBERRY SUPPLEMENTS PO) Take 1 capsule by mouth 2 (two) times daily.  . sertraline (ZOLOFT) 25 MG tablet Take 25 mg by mouth daily. X 4 weeks then increase to 50 mg qhs  . triamcinolone cream (KENALOG) 0.1 % Apply 1 application topically 2 (two) times daily.  Marland Kitchen albuterol (PROVENTIL) (2.5 MG/3ML) 0.083% nebulizer solution Take 3 mLs (2.5 mg total) by nebulization every 2 (two) hours as  needed for wheezing or shortness of breath.  Marland Kitchen amLODipine (NORVASC) 10 MG tablet Take 1 tablet (10 mg total) by mouth daily.  Marland Kitchen aspirin EC 81 MG tablet Take 81 mg by mouth daily.  . Calcium Carb-Cholecalciferol 600-800 MG-UNIT CHEW Chew 1 tablet by mouth 2 (two) times daily.   . carbidopa-levodopa (SINEMET IR) 25-100 MG tablet Take 1.5 tablets by mouth 3 (three) times daily.  . carboxymethylcellulose (REFRESH PLUS) 0.5 % SOLN Apply 2 drops to eye as needed (dry eyes).   . clopidogrel (PLAVIX) 75 MG tablet Take 75 mg by mouth daily.  Marland Kitchen Co-Enzyme Q-10 100 MG CAPS Take 100 mg by mouth daily.  Marland Kitchen GLUCOSAMINE-CHONDROITIN-MSM PO Take 1 tablet by mouth daily.  . hydrochlorothiazide (MICROZIDE) 12.5 MG capsule Take 12.5 mg by mouth daily.  . Lutein-Zeaxanthin 25-5 MG CAPS Take 1 capsule by mouth daily.  . Melatonin-Chamomile 3-500 MG-MCG TABS Take 2 tablets by mouth at bedtime as needed (sleep).  . Memantine HCl-Donepezil HCl (NAMZARIC) 28-10 MG CP24 Take 1 capsule at bedtime by mouth.   . Menthol, Topical Analgesic, (BIOFREEZE) 4 % GEL Apply 1 application topically 2 (two) times daily as needed (pain).   . Multiple Vitamin (MULTI-VITAMINS) TABS Take 1 tablet daily by mouth.   . Omega-3 Fatty Acids (OMEGA 3 PO) Take 1,000 mg 2 (two) times daily by mouth.   . pantoprazole (PROTONIX) 40 MG tablet Take 1 tablet (40 mg total) by mouth daily.  . polyethylene glycol (MIRALAX / GLYCOLAX) packet Take 17 g by mouth every other day.  . senna-docusate (SENOKOT-S) 8.6-50 MG tablet Take 2 tablets 2 (two) times daily by mouth.   . telmisartan (MICARDIS) 80 MG tablet Take 80 mg daily by mouth.  . triamcinolone (NASACORT) 55 MCG/ACT AERO nasal inhaler Place 2 sprays into the nose at bedtime.  . vitamin C (ASCORBIC ACID) 500 MG tablet Take 500 mg by mouth daily.  . [DISCONTINUED] escitalopram (LEXAPRO) 10 MG tablet Take 10 mg by mouth daily.  . [DISCONTINUED] nebivolol (BYSTOLIC) 5 MG tablet Take 5 mg by mouth daily.     No facility-administered encounter medications on file as of 05/13/2018.     Review of Systems  Unable to perform ROS: Dementia    Immunization History  Administered Date(s) Administered  . Influenza Inj Mdck Quad Pf 08/09/2017  . Influenza, High Dose Seasonal PF 07/31/2016  . Influenza-Unspecified 08/01/2010, 07/30/2012, 10/16/2012, 10/16/2013  . Pneumococcal Polysaccharide-23 02/23/2004, 02/21/2013  . Pneumococcal-Unspecified 03/17/2016  . Td 12/02/2008  . Tdap 09/01/2016  . Zoster 03/28/2006  . Zoster Recombinat (Shingrix) 10/29/2017, 01/28/2018   Pertinent  Health Maintenance Due  Topic Date Due  . INFLUENZA VACCINE  05/16/2018  . PNA vac Low Risk Adult  Completed   Fall Risk  08/21/2017 04/04/2017 02/26/2017  Falls in the past year? No No No   Functional Status Survey:    Vitals:  05/15/18 0744  Weight: 164 lb 9.6 oz (74.7 kg)   Body mass index is 25.03 kg/m. Physical Exam  Constitutional: No distress.  HENT:  Head: Normocephalic and atraumatic.  Neck: Normal range of motion. Neck supple. No JVD present. No tracheal deviation present. No thyromegaly present.  Cardiovascular: Normal rate and regular rhythm.  No murmur heard. Pulmonary/Chest: Effort normal and breath sounds normal. No respiratory distress. He has no wheezes.  Abdominal: Soft. Bowel sounds are normal. He exhibits no distension. There is no tenderness.  Musculoskeletal:  Rigidity noted to BUE and BLE  Lymphadenopathy:    He has no cervical adenopathy.  Neurological: He is alert.  Oriented to self and place not time  Skin: Skin is warm and dry. He is not diaphoretic.  Wounds to bilateral heels are closed. One small blister to left inner thigh, staff using triamcinolone  Psychiatric: He has a normal mood and affect.  Nursing note and vitals reviewed.   Labs reviewed: Recent Labs    11/28/17 0026 11/28/17 0513 11/29/17 0608  NA 138 139 143  K 2.8* 2.9* 3.8  CL 100* 105 110  CO2 24 22  24   GLUCOSE 226* 252* 77  BUN 25* 23* 19  CREATININE 1.07 1.04 0.85  CALCIUM 9.2 8.3* 8.3*  MG  --  1.4* 2.0   Recent Labs    11/28/17 0026  AST 25  ALT 9*  ALKPHOS 61  BILITOT 0.7  PROT 6.3*  ALBUMIN 3.1*   Recent Labs    11/28/17 0026 11/28/17 0513 11/29/17 0608  WBC 15.9* 15.4* 9.0  NEUTROABS 13.4*  --   --   HGB 11.5* 9.7* 10.5*  HCT 34.0* 28.1* 31.8*  MCV 91.9 91.5 93.0  PLT 215 199 195   Lab Results  Component Value Date   TSH 3.14 01/18/2018   Lab Results  Component Value Date   HGBA1C 6.8 (H) 09/07/2016   Lab Results  Component Value Date   CHOL 127 05/11/2017   HDL 50 05/11/2017   LDLCALC 58 05/11/2017   TRIG 98 05/11/2017    Significant Diagnostic Results in last 30 days:  No results found.  Assessment/Plan   Situational depression Currently he seems to be doing well on Zoloft 25 mg. He will go to the 50 mg dose this week and then need to re evaluated. It will be best to speak with him with his wife present to help evaluate his mood (due to his dementia)  Anemia Check CBC   BPH (benign prostatic hyperplasia) Has a chronic indwelling foley   Mixed Alzheimer's and vascular dementia Continue Namzaric and supportive care in the skilled care setting.   Cerebrovascular disease The nurse called and spoke with Dr. Jannifer Franklin regarding the use of aspirin and plavix. The dual regimen is likely necessary due to the above hx.  An order was written that if he had continued bleeding the dose could be reduced to qod but that is not needed at this time.   Bullous eruption Improved, continue triamcinolone per dermatology   Family/ staff Communication: staff  Labs/tests ordered:  CBC BMP due to hx of hematuria on plavix and asa

## 2018-05-15 ENCOUNTER — Encounter: Payer: Self-pay | Admitting: Adult Health

## 2018-05-15 DIAGNOSIS — L139 Bullous disorder, unspecified: Secondary | ICD-10-CM | POA: Insufficient documentation

## 2018-05-15 NOTE — Assessment & Plan Note (Addendum)
Has a chronic indwelling foley

## 2018-05-15 NOTE — Assessment & Plan Note (Signed)
Check CBC 

## 2018-05-15 NOTE — Assessment & Plan Note (Signed)
Continue Namzaric and supportive care in the skilled care setting.

## 2018-05-15 NOTE — Assessment & Plan Note (Signed)
Currently he seems to be doing well on Zoloft 25 mg. He will go to the 50 mg dose this week and then need to re evaluated. It will be best to speak with him with his wife present to help evaluate his mood (due to his dementia)

## 2018-05-15 NOTE — Assessment & Plan Note (Signed)
Improved, continue triamcinolone per dermatology

## 2018-05-15 NOTE — Assessment & Plan Note (Signed)
The nurse called and spoke with Dr. Jannifer Franklin regarding the use of aspirin and plavix. The dual regimen is likely necessary due to the above hx.  An order was written that if he had continued bleeding the dose could be reduced to qod but that is not needed at this time.

## 2018-05-20 DIAGNOSIS — Z79899 Other long term (current) drug therapy: Secondary | ICD-10-CM | POA: Diagnosis not present

## 2018-05-20 DIAGNOSIS — D649 Anemia, unspecified: Secondary | ICD-10-CM | POA: Diagnosis not present

## 2018-05-20 LAB — CBC AND DIFFERENTIAL
HCT: 39 — AB (ref 41–53)
HEMOGLOBIN: 12.6 — AB (ref 13.5–17.5)
Platelets: 318 (ref 150–399)
WBC: 9.1

## 2018-05-20 LAB — BASIC METABOLIC PANEL
BUN: 16 (ref 4–21)
Creatinine: 1 (ref 0.6–1.3)
GLUCOSE: 214
POTASSIUM: 4.2 (ref 3.4–5.3)
SODIUM: 139 (ref 137–147)

## 2018-06-04 DIAGNOSIS — H903 Sensorineural hearing loss, bilateral: Secondary | ICD-10-CM | POA: Diagnosis not present

## 2018-06-06 ENCOUNTER — Non-Acute Institutional Stay (SKILLED_NURSING_FACILITY): Payer: Medicare Other | Admitting: Adult Health

## 2018-06-06 DIAGNOSIS — Z960 Presence of urogenital implants: Secondary | ICD-10-CM

## 2018-06-06 DIAGNOSIS — F0150 Vascular dementia without behavioral disturbance: Secondary | ICD-10-CM

## 2018-06-06 DIAGNOSIS — F4321 Adjustment disorder with depressed mood: Secondary | ICD-10-CM

## 2018-06-06 DIAGNOSIS — R739 Hyperglycemia, unspecified: Secondary | ICD-10-CM

## 2018-06-06 DIAGNOSIS — D649 Anemia, unspecified: Secondary | ICD-10-CM

## 2018-06-06 DIAGNOSIS — R338 Other retention of urine: Secondary | ICD-10-CM

## 2018-06-06 DIAGNOSIS — L139 Bullous disorder, unspecified: Secondary | ICD-10-CM

## 2018-06-06 DIAGNOSIS — N401 Enlarged prostate with lower urinary tract symptoms: Secondary | ICD-10-CM

## 2018-06-06 DIAGNOSIS — F0280 Dementia in other diseases classified elsewhere without behavioral disturbance: Secondary | ICD-10-CM

## 2018-06-06 DIAGNOSIS — Z978 Presence of other specified devices: Secondary | ICD-10-CM

## 2018-06-06 DIAGNOSIS — G309 Alzheimer's disease, unspecified: Secondary | ICD-10-CM | POA: Diagnosis not present

## 2018-06-11 ENCOUNTER — Encounter: Payer: Self-pay | Admitting: Adult Health

## 2018-06-11 NOTE — Assessment & Plan Note (Signed)
No periods of tearfulness reported. Would continue zoloft at 50 mg qhs.

## 2018-06-11 NOTE — Assessment & Plan Note (Signed)
Hx of recurrent UTI. Clear yellow urine in the foley at this time. Continue catheter maintenance per [facility] protocol.

## 2018-06-11 NOTE — Assessment & Plan Note (Signed)
With urinary retention, currently with a foley and followed by urology.

## 2018-06-11 NOTE — Assessment & Plan Note (Signed)
Resolved. ?bullous pemphigoid vs. Contact dermatitis (leg bag)

## 2018-06-11 NOTE — Progress Notes (Signed)
Location:  [facility] of Service:  SNF (31) Provider:   Cindi Carbon, ANP [place] [phone]   [name], DO  Patient Care Team: [name], DO as PCP - General (Geriatric Medicine) Danella Sensing, MD as Consulting Physician (Dermatology) Sharyne Peach, MD as Consulting Physician (Ophthalmology) Irine Seal, MD as Attending Physician (Urology) [name], MD as Consulting Physician (Neurology)  Extended Emergency Contact Information Primary Emergency Contact: Bugay,[name] Address: [address]          [place] [address] [name] of [place] Phone: [phone] Relation: Spouse Secondary Emergency Contact: [name] States of [place] Mobile Phone: [phone] Relation: Daughter  Code Status:  DNR Goals of care: Advanced Directive information Advanced Directives 04/02/2018  Does Patient Have a Medical Advance Directive? Yes  Type of Paramedic of [place];Living will;Out of facility DNR (pink MOST or yellow form)  Does patient want to make changes to medical advance directive? No - Patient declined  Copy of [place] in Chart? Yes  Would patient like information on creating a medical advance directive? -  Pre-existing out of facility DNR order (yellow form or pink MOST form) Yellow form placed in chart (order not valid for inpatient use)     Chief Complaint  Patient presents with  . Medical Management of Chronic Issues    HPI:  Pt is a 84 y.o. male seen today for medical management of chronic diseases.    He has been treated for a bullous rash with prednisone in the past and triamcinolone which has resolved.   Hx of recurrent UTI associated with a foley cath which is in place due to urinary retention associated with BPH. No current symptoms of UTI per nsg staff.  Depression: started on Zoloft on 04/19/18.  Staff deny any symptoms of  depression which is difficult to assess due to his dementia.   Mixed AD/ vascular dementia: decline in cognition over the past year, currently on namzaric 12/01/17 MMSE 15/30  Anemia: improved Lab Results  Component Value Date   HGB 12.6 (A) 05/20/2018   Hyperglycemia: noted on BMP 214 after breakfast       Past Medical History:  Diagnosis Date  . Abnormality of gait   . Arthritis   . Brain bleed ([place]) 09/01/2016  . Cervical spondylosis   . Degenerative joint disease (DJD) of lumbar spine   . Depression   . Diplopia   . Dyslipidemia   . Essential tremor   . Foul smelling urine   . Frequent falls   . GERD (gastroesophageal reflux disease)   . Glycosuria   . Hearing difficulty    hearing aids  . Hyperlipidemia   . Hypertension   . Hyperthyroidism   . Memory loss   . Osteoarthritis   . Radiculopathy, lumbar region   . 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 Small vessel disease ([place])   . TIA (transient ischemic attack)    Past Surgical History:  Procedure Laterality Date  . APPENDECTOMY     done as a child  . GALLBLADDER SURGERY  2008  . KNEE ARTHROSCOPY Left    Dr. Hart Robinsons 2002  . knee injections Right    Dr. Adriana Mccallum  . TONSILLECTOMY     done as a child    No Known Allergies  Outpatient Encounter Medications as of 06/06/2018  Medication Sig  .  albuterol (PROVENTIL) (2.5 MG/3ML) 0.083% nebulizer solution Take 3 mLs (2.5 mg total) by nebulization every 2 (two) hours as needed for wheezing or shortness of breath.  Marland Kitchen amLODipine (NORVASC) 10 MG tablet Take 1 tablet (10 mg total) by mouth daily.  Marland Kitchen aspirin EC 81 MG tablet Take 81 mg by mouth daily.  . Calcium Carb-Cholecalciferol 600-800 MG-UNIT CHEW Chew 1 tablet by mouth 2 (two) times daily.   . carbidopa-levodopa (SINEMET IR) 25-100 MG tablet Take 1.5 tablets by mouth 3 (three) times daily.  . carboxymethylcellulose (REFRESH PLUS) 0.5 % SOLN Apply 2 drops to eye as needed  (dry eyes).   . clopidogrel (PLAVIX) 75 MG tablet Take 75 mg by mouth daily.  Marland Kitchen Co-Enzyme Q-10 100 MG CAPS Take 100 mg by mouth daily.  . Cranberry-Vit C-Lactobacillus (RA CRANBERRY SUPPLEMENTS PO) Take 1 capsule by mouth 2 (two) times daily.  Marland Kitchen GLUCOSAMINE-CHONDROITIN-MSM PO Take 1 tablet by mouth daily.  . hydrochlorothiazide (MICROZIDE) 12.5 MG capsule Take 12.5 mg by mouth daily.  . Lutein-Zeaxanthin 25-5 MG CAPS Take 1 capsule by mouth daily.  . Melatonin-Chamomile 3-500 MG-MCG TABS Take 2 tablets by mouth at bedtime as needed (sleep).  . Memantine HCl-Donepezil HCl (NAMZARIC) 28-10 MG CP24 Take 1 capsule at bedtime by mouth.   . Menthol, Topical Analgesic, (BIOFREEZE) 4 % GEL Apply 1 application topically 2 (two) times daily as needed (pain).   . Multiple Vitamin (MULTI-VITAMINS) TABS Take 1 tablet daily by mouth.   . Omega-3 Fatty Acids (OMEGA 3 PO) Take 1,000 mg 2 (two) times daily by mouth.   . pantoprazole (PROTONIX) 40 MG tablet Take 1 tablet (40 mg total) by mouth daily.  . polyethylene glycol (MIRALAX / GLYCOLAX) packet Take 17 g by mouth every other day.  . senna-docusate (SENOKOT-S) 8.6-50 MG tablet Take 2 tablets 2 (two) times daily by mouth.   . sertraline (ZOLOFT) 50 MG tablet Take 50 mg by mouth at bedtime.  Marland Kitchen telmisartan (MICARDIS) 80 MG tablet Take 80 mg daily by mouth.  . triamcinolone (NASACORT) 55 MCG/ACT AERO nasal inhaler Place 2 sprays into the nose at bedtime.  . vitamin C (ASCORBIC ACID) 500 MG tablet Take 500 mg by mouth daily.  . [DISCONTINUED] sertraline (ZOLOFT) 25 MG tablet Take 25 mg by mouth daily. X 4 weeks then increase to 50 mg qhs  . [DISCONTINUED] triamcinolone cream (KENALOG) 0.1 % Apply 1 application topically 2 (two) times daily.   No facility-administered encounter medications on file as of 06/06/2018.     Review of Systems  Constitutional: Negative for activity change, appetite change, chills, diaphoresis, fatigue, fever and unexpected weight  change.  Respiratory: Negative for cough, shortness of breath, wheezing and stridor.   Cardiovascular: Negative for chest pain, palpitations and leg swelling.  Gastrointestinal: Negative for abdominal distention, abdominal pain, constipation and diarrhea.  Genitourinary: Negative for difficulty urinating and dysuria.       Has foley  Musculoskeletal: Positive for gait problem. Negative for arthralgias, back pain, joint swelling and myalgias.  Skin: Negative for rash and wound.  Neurological: Negative for dizziness, seizures, syncope, facial asymmetry, speech difficulty, weakness and headaches.  Hematological: Negative for adenopathy. Does not bruise/bleed easily.  Psychiatric/Behavioral: Positive for confusion. Negative for agitation and behavioral problems.    Immunization History  Administered Date(s) Administered  . Influenza Inj Mdck Quad Pf 08/09/2017  . Influenza, High Dose Seasonal PF 07/31/2016  . Influenza-Unspecified 08/01/2010, 07/30/2012, 10/16/2012, 10/16/2013  . Pneumococcal Polysaccharide-23 02/23/2004, 02/21/2013  . Pneumococcal-Unspecified  03/17/2016  . Td 12/02/2008  . Tdap 09/01/2016  . Zoster 03/28/2006  . Zoster Recombinat (Shingrix) 10/29/2017, 01/28/2018   Pertinent  Health Maintenance Due  Topic Date Due  . INFLUENZA VACCINE  05/16/2018  . PNA vac Low Risk Adult  Completed   Fall Risk  08/21/2017 04/04/2017 02/26/2017  Falls in the past year? No No No   Functional Status Survey:    Vitals:   06/11/18 0759  BP: (!) 143/69  Pulse: 63  Resp: 19  Temp: 98.9 F (37.2 C)  SpO2: 100%  Weight: 164 lb 6.4 oz (74.6 kg)   Body mass index is 25 kg/m.  Wt Readings from Last 3 Encounters:  06/11/18 164 lb 6.4 oz (74.6 kg)  05/15/18 164 lb 9.6 oz (74.7 kg)  04/19/18 164 lb 9.6 oz (74.7 kg)   Physical Exam  Constitutional: No distress.  HENT:  Head: Normocephalic and atraumatic.  Neck: Normal range of motion. Neck supple. No JVD present. No tracheal  deviation present. No thyromegaly present.  Cardiovascular: Normal rate and regular rhythm.  No murmur heard. Pulmonary/Chest: Effort normal and breath sounds normal. No respiratory distress. He has no wheezes.  Abdominal: Soft. Bowel sounds are normal. He exhibits no distension. There is no tenderness.  Genitourinary:  Genitourinary Comments: Foley cath with clear yellow urine  Musculoskeletal: He exhibits no edema, tenderness or deformity.  Lymphadenopathy:    He has no cervical adenopathy.  Neurological: He is alert.  Oriented x 2  Skin: Skin is warm and dry. He is not diaphoretic.  Heel wounds closed with no surrounding erythema or tenderness  Psychiatric: He has a normal mood and affect.  Nursing note and vitals reviewed.   Labs reviewed: Recent Labs    11/28/17 0026 11/28/17 0513 11/29/17 0608 05/20/18  NA 138 139 143 139  K 2.8* 2.9* 3.8 4.2  CL 100* 105 110  --   CO2 24 22 24   --   GLUCOSE 226* 252* 77  --   BUN 25* 23* 19 16  CREATININE 1.07 1.04 0.85 1.0  CALCIUM 9.2 8.3* 8.3*  --   MG  --  1.4* 2.0  --    Recent Labs    11/28/17 0026  AST 25  ALT 9*  ALKPHOS 61  BILITOT 0.7  PROT 6.3*  ALBUMIN 3.1*   Recent Labs    11/28/17 0026 11/28/17 0513 11/29/17 0608 05/20/18  WBC 15.9* 15.4* 9.0 9.1  NEUTROABS 13.4*  --   --   --   HGB 11.5* 9.7* 10.5* 12.6*  HCT 34.0* 28.1* 31.8* 39*  MCV 91.9 91.5 93.0  --   PLT 215 199 195 318   Lab Results  Component Value Date   TSH 3.14 01/18/2018   Lab Results  Component Value Date   HGBA1C 6.8 (H) 09/07/2016   Lab Results  Component Value Date   CHOL 127 05/11/2017   HDL 50 05/11/2017   LDLCALC 58 05/11/2017   TRIG 98 05/11/2017    Significant Diagnostic Results in last 30 days:  No results found.  Assessment/Plan  Mixed Alzheimer's and vascular dementia Progressive decline in cognition and function. Continue namzaric and supportive skilled environment  Anemia Associated with acute illness  earlier in the year and chronic disease.  Improved  Situational depression No periods of tearfulness reported. Would continue zoloft at 50 mg qhs.   BPH (benign prostatic hyperplasia) With urinary retention, currently with a foley and followed by urology.  Chronic indwelling Foley catheter  Hx of recurrent UTI. Clear yellow urine in the foley at this time. Continue catheter maintenance per [facility] protocol.  Hyperglycemia Blood glucose 214 on am labs after breakfast. Discussed with his wife. No aggressive measures given his advanced dementia but we did agree to reduce sugar/carb intake.   Bullous eruption Resolved. ?bullous pemphigoid vs. Contact dermatitis (leg bag)   Family/ staff Communication: resident/staff  Labs/tests ordered:  NA

## 2018-06-11 NOTE — Assessment & Plan Note (Signed)
Associated with acute illness earlier in the year and chronic disease.  Improved

## 2018-06-11 NOTE — Assessment & Plan Note (Signed)
Progressive decline in cognition and function. Continue namzaric and supportive skilled environment

## 2018-06-11 NOTE — Assessment & Plan Note (Signed)
Blood glucose 214 on am labs after breakfast. Discussed with his wife. No aggressive measures given his advanced dementia but we did agree to reduce sugar/carb intake.

## 2018-06-18 DIAGNOSIS — L853 Xerosis cutis: Secondary | ICD-10-CM | POA: Diagnosis not present

## 2018-06-18 DIAGNOSIS — L821 Other seborrheic keratosis: Secondary | ICD-10-CM | POA: Diagnosis not present

## 2018-06-18 DIAGNOSIS — D1801 Hemangioma of skin and subcutaneous tissue: Secondary | ICD-10-CM | POA: Diagnosis not present

## 2018-06-27 DIAGNOSIS — M47816 Spondylosis without myelopathy or radiculopathy, lumbar region: Secondary | ICD-10-CM | POA: Diagnosis not present

## 2018-06-27 DIAGNOSIS — L89612 Pressure ulcer of right heel, stage 2: Secondary | ICD-10-CM | POA: Diagnosis not present

## 2018-06-27 DIAGNOSIS — G20 Parkinson's disease: Secondary | ICD-10-CM | POA: Diagnosis not present

## 2018-06-27 DIAGNOSIS — M6389 Disorders of muscle in diseases classified elsewhere, multiple sites: Secondary | ICD-10-CM | POA: Diagnosis not present

## 2018-06-27 DIAGNOSIS — G214 Vascular parkinsonism: Secondary | ICD-10-CM | POA: Diagnosis not present

## 2018-06-27 DIAGNOSIS — L89622 Pressure ulcer of left heel, stage 2: Secondary | ICD-10-CM | POA: Diagnosis not present

## 2018-06-28 DIAGNOSIS — G20 Parkinson's disease: Secondary | ICD-10-CM | POA: Diagnosis not present

## 2018-06-28 DIAGNOSIS — M6389 Disorders of muscle in diseases classified elsewhere, multiple sites: Secondary | ICD-10-CM | POA: Diagnosis not present

## 2018-06-28 DIAGNOSIS — L89612 Pressure ulcer of right heel, stage 2: Secondary | ICD-10-CM | POA: Diagnosis not present

## 2018-06-28 DIAGNOSIS — G214 Vascular parkinsonism: Secondary | ICD-10-CM | POA: Diagnosis not present

## 2018-06-28 DIAGNOSIS — L89622 Pressure ulcer of left heel, stage 2: Secondary | ICD-10-CM | POA: Diagnosis not present

## 2018-06-28 DIAGNOSIS — M47816 Spondylosis without myelopathy or radiculopathy, lumbar region: Secondary | ICD-10-CM | POA: Diagnosis not present

## 2018-07-01 DIAGNOSIS — G20 Parkinson's disease: Secondary | ICD-10-CM | POA: Diagnosis not present

## 2018-07-01 DIAGNOSIS — G214 Vascular parkinsonism: Secondary | ICD-10-CM | POA: Diagnosis not present

## 2018-07-01 DIAGNOSIS — M6389 Disorders of muscle in diseases classified elsewhere, multiple sites: Secondary | ICD-10-CM | POA: Diagnosis not present

## 2018-07-01 DIAGNOSIS — M47816 Spondylosis without myelopathy or radiculopathy, lumbar region: Secondary | ICD-10-CM | POA: Diagnosis not present

## 2018-07-01 DIAGNOSIS — L89612 Pressure ulcer of right heel, stage 2: Secondary | ICD-10-CM | POA: Diagnosis not present

## 2018-07-01 DIAGNOSIS — L89622 Pressure ulcer of left heel, stage 2: Secondary | ICD-10-CM | POA: Diagnosis not present

## 2018-07-02 ENCOUNTER — Non-Acute Institutional Stay (SKILLED_NURSING_FACILITY): Payer: Medicare Other | Admitting: Internal Medicine

## 2018-07-02 ENCOUNTER — Encounter: Payer: Self-pay | Admitting: Internal Medicine

## 2018-07-02 DIAGNOSIS — R338 Other retention of urine: Secondary | ICD-10-CM

## 2018-07-02 DIAGNOSIS — F0150 Vascular dementia without behavioral disturbance: Secondary | ICD-10-CM

## 2018-07-02 DIAGNOSIS — Z960 Presence of urogenital implants: Secondary | ICD-10-CM | POA: Diagnosis not present

## 2018-07-02 DIAGNOSIS — F4321 Adjustment disorder with depressed mood: Secondary | ICD-10-CM | POA: Diagnosis not present

## 2018-07-02 DIAGNOSIS — N401 Enlarged prostate with lower urinary tract symptoms: Secondary | ICD-10-CM

## 2018-07-02 DIAGNOSIS — G309 Alzheimer's disease, unspecified: Secondary | ICD-10-CM

## 2018-07-02 DIAGNOSIS — Z978 Presence of other specified devices: Secondary | ICD-10-CM

## 2018-07-02 DIAGNOSIS — L89610 Pressure ulcer of right heel, unstageable: Secondary | ICD-10-CM

## 2018-07-02 DIAGNOSIS — F0280 Dementia in other diseases classified elsewhere without behavioral disturbance: Secondary | ICD-10-CM

## 2018-07-02 NOTE — Progress Notes (Signed)
Patient ID: Kenneth Stark, male   DOB: 04-08-1925, 84 y.o.   MRN: [id number]  Location:  [facility] Room Number: [place] of Service:  SNF ([phone]) Provider:   [name], DO  Patient Care Team: [name], DO as PCP - General (Geriatric Medicine) Danella Sensing, MD as Consulting Physician (Dermatology) Sharyne Peach, MD as Consulting Physician (Ophthalmology) Irine Seal, MD as Attending Physician (Urology) [name], MD as Consulting Physician (Neurology)  Extended Emergency Contact Information Primary Emergency Contact: Kenneth Stark,Kenneth Stark Address: [address]          [place] [address] [name] of [place] Phone: [phone] Relation: Spouse Secondary Emergency Contact: Kenneth Stark States of [place] Mobile Phone: [phone] Relation: Daughter  Code Status:  DNR Goals of care: Advanced Directive information Advanced Directives 07/02/2018  Does Patient Have a Medical Advance Directive? Yes  Type of Paramedic of [place];Living will;Out of facility DNR (pink MOST or yellow form)  Does patient want to make changes to medical advance directive? No - Patient declined  Copy of [place] in Chart? Yes  Would patient like information on creating a medical advance directive? -  Pre-existing out of facility DNR order (yellow form or pink MOST form) Yellow form placed in chart (order not valid for inpatient use)     Chief Complaint  Patient presents with  . Medical Management of Chronic Issues    Routine Visit    HPI:  Pt is a 84 y.o. male seen today for medical management of chronic diseases.    He was alert and talkative today.  Resting in bed for his afternoon nap.  His wife, Kenneth Stark, is very involved in his care.  She's been ensuring he gets adequate hydration which has prevented the UTIs he was getting recurrently.  Since his subarachoid hemorrhage and  subdural from his fall, he has been dependent in ADLs.  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  He is quite fatigued much of the time and spends much time in bed.  Prior to his trauma, he had just mild cognitive impairment and parkinsonism.  He has chronic urinary retention with an indwelling foley longstanding.    Recently, he has developed a right heel pressure ulcer.  He now has a Catering manager" device in place to decrease pressure and a duoderm-like dressing covering his heel for protection.  Had on left heel before and it healed.  Weight is stable over the past 3 mos around 165 lbs.  MMSE - Mini Mental State Exam 01/17/2017  Orientation to time 4  Orientation to Place 5  Registration 3  Attention/ Calculation 0  Recall 0  Language- name 2 objects 2  Language- repeat 1  Language- follow 3 step command 3  Language- read & follow direction 0  Write a sentence 0  Copy design 0  Total score 18  but last mmse was 12/30 on 06/08/18 here.  He's had mild anemia on his latest labs.  His wife is interested in comfort care for him.  We need to complete a MOST form.  Past Medical History:  Diagnosis Date  . Abnormality of gait   . Arthritis   . Brain bleed ([place]) 09/01/2016  . Cervical spondylosis   . Degenerative joint disease (DJD) of lumbar spine   . Depression   . Diplopia   .  Dyslipidemia   . Essential tremor   . Foul smelling urine   . Frequent falls   . GERD (gastroesophageal reflux disease)   . Glycosuria   . Hearing difficulty    hearing aids  . Hyperlipidemia   . Hypertension   . Hyperthyroidism   . Memory loss   . Osteoarthritis   . Radiculopathy, lumbar region   . 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 Small vessel disease ([place])   . TIA (transient ischemic attack)    Past Surgical History:  Procedure Laterality Date  . APPENDECTOMY      done as a child  . GALLBLADDER SURGERY  2008  . KNEE ARTHROSCOPY Left    Dr. Hart Robinsons 2002  . knee injections Right    Dr. Adriana Mccallum  . TONSILLECTOMY     done as a child    No Known Allergies  Outpatient Encounter Medications as of 07/02/2018  Medication Sig  . albuterol (PROVENTIL) (2.5 MG/3ML) 0.083% nebulizer solution Take 3 mLs (2.5 mg total) by nebulization every 2 (two) hours as needed for wheezing or shortness of breath.  Marland Kitchen amLODipine (NORVASC) 10 MG tablet Take 1 tablet (10 mg total) by mouth daily.  Marland Kitchen ammonium lactate (AMLACTIN) 12 % cream Apply topically as needed for dry skin.  Marland Kitchen aspirin EC 81 MG tablet Take 81 mg by mouth daily.  . Calcium Carb-Cholecalciferol 600-800 MG-UNIT CHEW Chew 1 tablet by mouth 2 (two) times daily.   . carbidopa-levodopa (SINEMET IR) 25-100 MG tablet Take 1.5 tablets by mouth 3 (three) times daily.  . carboxymethylcellulose (REFRESH PLUS) 0.5 % SOLN Apply 2 drops to eye as needed (dry eyes).   . clopidogrel (PLAVIX) 75 MG tablet Take 75 mg by mouth daily.  . clotrimazole-betamethasone (LOTRISONE) cream Apply 1 application topically 2 (two) times daily.  Marland Kitchen Co-Enzyme Q-10 100 MG CAPS Take 100 mg by mouth daily.  . Cranberry-Vit C-Lactobacillus (RA CRANBERRY SUPPLEMENTS PO) Take 1 capsule by mouth 2 (two) times daily.  Marland Kitchen GLUCOSAMINE-CHONDROITIN-MSM PO Take 1 tablet by mouth daily.  . hydrochlorothiazide (MICROZIDE) 12.5 MG capsule Take 12.5 mg by mouth daily.  . Lutein-Zeaxanthin 25-5 MG CAPS Take 1 capsule by mouth daily.  . Melatonin-Chamomile 3-500 MG-MCG TABS Take 2 tablets by mouth at bedtime as needed (sleep).  . Memantine HCl-Donepezil HCl (NAMZARIC) 28-10 MG CP24 Take 1 capsule at bedtime by mouth.   . Menthol, Topical Analgesic, (BIOFREEZE) 4 % GEL Apply 1 application topically 2 (two) times daily as needed (pain).   . Multiple Vitamin (MULTI-VITAMINS) TABS Take 1 tablet daily by mouth.   . Omega-3 Fatty Acids (OMEGA 3 PO) Take 1,000 mg  2 (two) times daily by mouth.   . pantoprazole (PROTONIX) 40 MG tablet Take 1 tablet (40 mg total) by mouth daily.  . polyethylene glycol (MIRALAX / GLYCOLAX) packet Take 17 g by mouth every other day.  . senna-docusate (SENOKOT-S) 8.6-50 MG tablet Take 2 tablets 2 (two) times daily by mouth.   . sertraline (ZOLOFT) 50 MG tablet Take 50 mg by mouth at bedtime.  Marland Kitchen telmisartan (MICARDIS) 80 MG tablet Take 80 mg daily by mouth.  . triamcinolone (NASACORT) 55 MCG/ACT AERO nasal inhaler Place 2 sprays into the nose at bedtime.  . vitamin C (ASCORBIC ACID) 500 MG tablet Take 500 mg by mouth daily.   No facility-administered encounter medications on file as of 07/02/2018.     Review of Systems  Constitutional: Positive for activity change and fatigue. Negative for appetite change, chills and fever.  HENT: Positive for hearing loss.   Eyes: Negative for visual disturbance.       Glasses  Respiratory: Negative for chest tightness and shortness of breath.   Cardiovascular: Negative for chest pain, palpitations and leg swelling.  Gastrointestinal: Positive for constipation. Negative for abdominal pain, blood in stool, diarrhea and nausea.  Genitourinary: Negative for dysuria.       Chronic foley with dark yellow urine   Musculoskeletal: Positive for gait problem. Negative for arthralgias.       Transported in high back manual wheelchair,no longer safe to use his power chair  Neurological: Positive for weakness. Negative for dizziness.  Psychiatric/Behavioral: Positive for confusion.       Flat affect, decreasing speech    Immunization History  Administered Date(s) Administered  . Influenza Inj Mdck Quad Pf 08/09/2017  . Influenza, High Dose Seasonal PF 07/31/2016  . Influenza-Unspecified 08/01/2010, 07/30/2012, 10/16/2012, 10/16/2013  . Pneumococcal Polysaccharide-23 02/23/2004, 02/21/2013  . Pneumococcal-Unspecified 03/17/2016  . Td 12/02/2008  . Tdap 09/01/2016  . Zoster 03/28/2006  .  Zoster Recombinat (Shingrix) 10/29/2017, 01/28/2018   Pertinent  Health Maintenance Due  Topic Date Due  . INFLUENZA VACCINE  05/16/2018  . PNA vac Low Risk Adult  Completed   Fall Risk  08/21/2017 04/04/2017 02/26/2017  Falls in the past year? No No No   Functional Status Survey:    Vitals:   07/02/18 1131  BP: (!) 140/52  Pulse: 71  Resp: 18  Temp: (!) 97 F (36.1 C)  TempSrc: Oral  SpO2: 98%  Weight: 165 lb (74.8 kg)  Height: 5\' 8"  (1.727 m)   Body mass index is 25.09 kg/m. Physical Exam  Constitutional: No distress.  HENT:  Head: Normocephalic and atraumatic.  Cardiovascular: Normal rate, regular rhythm and normal heart sounds.  Pulmonary/Chest: Effort normal and breath sounds normal. No respiratory distress.  Abdominal: Soft. Bowel sounds are normal.  Genitourinary:  Genitourinary Comments: Foley with dark yellow urine  Neurological: He is alert.  6th n palsy  Skin: Skin is warm.  Right heel with duoderm and bunny boot in place  Psychiatric:  Flat affect    Labs reviewed: Recent Labs    11/28/17 0026 11/28/17 0513 11/29/17 0608 05/20/18  NA 138 139 143 139  K 2.8* 2.9* 3.8 4.2  CL 100* 105 110  --   CO2 24 22 24   --   GLUCOSE 226* 252* 77  --   BUN 25* 23* 19 16  CREATININE 1.07 1.04 0.85 1.0  CALCIUM 9.2 8.3* 8.3*  --   MG  --  1.4* 2.0  --    Recent Labs    11/28/17 0026  AST 25  ALT 9*  ALKPHOS 61  BILITOT 0.7  PROT 6.3*  ALBUMIN 3.1*   Recent Labs    11/28/17 0026 11/28/17 0513 11/29/17 0608 05/20/18  WBC 15.9* 15.4* 9.0 9.1  NEUTROABS 13.4*  --   --   --   HGB 11.5* 9.7* 10.5* 12.6*  HCT 34.0* 28.1* 31.8* 39*  MCV 91.9 91.5 93.0  --   PLT 215 199 195 318   Lab Results  Component Value Date   TSH 3.14 01/18/2018   Lab Results  Component Value Date   HGBA1C 6.8 (H) 09/07/2016   Lab Results  Component Value Date   CHOL 127 05/11/2017   HDL 50 05/11/2017   LDLCALC 58 05/11/2017  TRIG 98 05/11/2017    Assessment/Plan 1. Mixed Alzheimer's and vascular dementia ([place]) -gradually progressing with decreasing MMSE, less interaction and speech -his wife is interested in a comfort care approach for his best QOL based on discussions we've had but we need to document on a MOST form  2. Situational depression -cont zoloft therapy--life changed quickly and he's had parkinsonism, strokes that increase risk of depression  3. Benign prostatic hyperplasia with urinary retention -cont foley, adequate hydration for UTI prevention and regular catheter changing protocol  4. Chronic indwelling Foley catheter -see #3  5. Pressure ulcer of right heel, unstageable (HCC) -cont cushioned dressing and bunny boot to promote healing and prevent progression  Family/ staff Communication: discussed with SNF3 nurse  Diarra Ceja L. Gary Gabrielsen, D.O. [facility] [address], [place] [address] Cell Phone (Mon-Fri 8am-5pm):  [phone] On Call:  [phone] & follow prompts after 5pm & weekends Office Phone:  [phone] Office Fax:  [phone]

## 2018-07-11 DIAGNOSIS — N390 Urinary tract infection, site not specified: Secondary | ICD-10-CM | POA: Diagnosis not present

## 2018-07-11 DIAGNOSIS — E708 Other disorders of aromatic amino-acid metabolism: Secondary | ICD-10-CM | POA: Diagnosis not present

## 2018-07-11 DIAGNOSIS — Z79899 Other long term (current) drug therapy: Secondary | ICD-10-CM | POA: Diagnosis not present

## 2018-07-15 DIAGNOSIS — G20 Parkinson's disease: Secondary | ICD-10-CM | POA: Diagnosis not present

## 2018-07-15 DIAGNOSIS — L89612 Pressure ulcer of right heel, stage 2: Secondary | ICD-10-CM | POA: Diagnosis not present

## 2018-07-15 DIAGNOSIS — G214 Vascular parkinsonism: Secondary | ICD-10-CM | POA: Diagnosis not present

## 2018-07-15 DIAGNOSIS — L89622 Pressure ulcer of left heel, stage 2: Secondary | ICD-10-CM | POA: Diagnosis not present

## 2018-07-15 DIAGNOSIS — M47816 Spondylosis without myelopathy or radiculopathy, lumbar region: Secondary | ICD-10-CM | POA: Diagnosis not present

## 2018-07-15 DIAGNOSIS — M6389 Disorders of muscle in diseases classified elsewhere, multiple sites: Secondary | ICD-10-CM | POA: Diagnosis not present

## 2018-07-17 ENCOUNTER — Non-Acute Institutional Stay (SKILLED_NURSING_FACILITY): Payer: Medicare Other

## 2018-07-17 DIAGNOSIS — Z0000 Encounter for general adult medical examination without abnormal findings: Secondary | ICD-10-CM

## 2018-07-17 NOTE — Progress Notes (Signed)
Subjective:   Kenneth Stark is a 84 y.o. male who presents for Medicare Annual/Subsequent preventive examination at [facility]  Last AWV-08/21/2017    Objective:    Vitals: BP 136/62 (BP Location: Left Arm, Patient Position: Sitting)   Pulse 70   Temp 98 F (36.7 C) (Oral)   Ht 5\' 8"  (1.727 m)   Wt 165 lb (74.8 kg)   BMI 25.09 kg/m   Body mass index is 25.09 kg/m.  Advanced Directives 07/17/2018 07/02/2018 04/02/2018 01/08/2018 12/04/2017 11/28/2017 08/21/2017  Does Patient Have a Medical Advance Directive? Yes Yes Yes Yes Yes Yes Yes  Type of Paramedic of [place];Living will;Out of facility DNR (pink MOST or yellow form) [place];Living will;Out of facility DNR (pink MOST or yellow form) [place];Living will;Out of facility DNR (pink MOST or yellow form) Out of facility DNR (pink MOST or yellow form);[place];Living will Out of facility DNR (pink MOST or yellow form);[place];Living will Out of facility DNR (pink MOST or yellow form);Living will;Healthcare Power of Attorney Out of facility DNR (pink MOST or yellow form);[place];Living will  Does patient want to make changes to medical advance directive? No - Patient declined No - Patient declined No - Patient declined No - Patient declined No - Patient declined No - Patient declined No - Patient declined  Copy of [place] in Chart? Yes Yes Yes Yes Yes - Yes  Would patient like information on creating a medical advance directive? - - - - - - -  Pre-existing out of facility DNR order (yellow form or pink MOST form) Yellow form placed in chart (order not valid for inpatient use) Yellow form placed in chart (order not valid for inpatient use) Yellow form placed in chart (order not valid for inpatient use) Yellow form placed in chart (order not valid for inpatient use) Yellow form placed  in chart (order not valid for inpatient use) Yellow form placed in chart (order not valid for inpatient use) Yellow form placed in chart (order not valid for inpatient use);Pink MOST form placed in chart (order not valid for inpatient use)    Tobacco Social History   Tobacco Use  Smoking Status Never Smoker  Smokeless Tobacco Never Used     Counseling given: Not Answered   Clinical Intake:  Pre-visit preparation completed: No  Pain : No/denies pain     Diabetes: No  How often do you need to have someone help you when you read instructions, pamphlets, or other written materials from your doctor or pharmacy?: 4 - Often  Interpreter Needed?: No  Information entered by :: Tyson Dense, RN  Past Medical History:  Diagnosis Date  . Abnormality of gait   . Arthritis   . Brain bleed ([place]) 09/01/2016  . Cervical spondylosis   . Degenerative joint disease (DJD) of lumbar spine   . Depression   . Diplopia   . Dyslipidemia   . Essential tremor   . Foul smelling urine   . Frequent falls   . GERD (gastroesophageal reflux disease)   . Glycosuria   . Hearing difficulty    hearing aids  . Hyperlipidemia   . Hypertension   . Hyperthyroidism   . Memory loss   . Osteoarthritis   . Radiculopathy, lumbar region   . 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 Small vessel  disease ([place])   . TIA (transient ischemic attack)    Past Surgical History:  Procedure Laterality Date  . APPENDECTOMY     done as a child  . GALLBLADDER SURGERY  2008  . KNEE ARTHROSCOPY Left    Dr. Hart Robinsons 2002  . knee injections Right    Dr. Adriana Mccallum  . TONSILLECTOMY     done as a child   Family History  Problem Relation Age of Onset  . Stroke Mother   . Dementia Mother   . Stroke Father   . Heart disease Father   . Diabetes Father   . Dementia Brother   . Renal Disease Brother   . Diabetes Brother   . Renal Disease Daughter    Social History   Socioeconomic  History  . Marital status: Married    Spouse name: [name]  . Number of children: 4  . Years of education: Not on file  . Highest education level: Not on file  Occupational History  . Occupation: Retired    Comment: [employer]  Social Needs  . Financial resource strain: Not hard at all  . Food insecurity:    Worry: Never true    Inability: Never true  . Transportation needs:    Medical: No    Non-medical: No  Tobacco Use  . Smoking status: Never Smoker  . Smokeless tobacco: Never Used  Substance and Sexual Activity  . Alcohol use: Yes    Comment: Wife occass/rare  . Drug use: No  . Sexual activity: Not on file  Lifestyle  . Physical activity:    Days per week: 0 days    Minutes per session: 0 min  . Stress: Not at all  Relationships  . Social connections:    Talks on phone: More than three times a week    Gets together: More than three times a week    Attends religious service: Never    Active member of club or organization: No    Attends meetings of clubs or organizations: Never    Relationship status: Married  Other Topics Concern  . Not on file  Social History Narrative   Tobacco use, amount per day now: NEVER   Past tobacco use, amount per day: never    How many years did you use tobacco:   Alcohol use (drinks per week): OCCASIONAL GLASS OF WINE   Diet:HIGH FIBER, LOW FAT   Do you drink/eat things with caffeine:COFFEE IN THE MORNING   Marital status:    MARRIED                             What year were you married? 1951   Do you live in a house, apartment, assisted living, condo, trailer, etc.? Assisted living    Is it one or more stories?   How many persons live in your home?    Do you have pets in your home?( please list) NO   Highest level of education completed: [place] classes    Current or past profession: [employer].   Do you exercise?    Walking    Type and how often? 3 times  a week    Do you have a living will? YES   Do you have a DNR form?  YES  If not, do you want to discuss one?   Do you have signed POA/HPOA forms?  YES                      If so, please bring to you appointment      Has difficulty bathing or dressing self   Has difficulty preparing food    Has difficulty managing medications   Has difficulty managing finances   Does not have any difficulty affording medications     Outpatient Encounter Medications as of 07/17/2018  Medication Sig  . albuterol (PROVENTIL) (2.5 MG/3ML) 0.083% nebulizer solution Take 3 mLs (2.5 mg total) by nebulization every 2 (two) hours as needed for wheezing or shortness of breath.  Marland Kitchen amLODipine (NORVASC) 10 MG tablet Take 1 tablet (10 mg total) by mouth daily.  Marland Kitchen ammonium lactate (AMLACTIN) 12 % cream Apply topically as needed for dry skin.  Marland Kitchen aspirin EC 81 MG tablet Take 81 mg by mouth daily.  . Calcium Carb-Cholecalciferol 600-800 MG-UNIT CHEW Chew 1 tablet by mouth 2 (two) times daily.   . carbidopa-levodopa (SINEMET IR) 25-100 MG tablet Take 1.5 tablets by mouth 3 (three) times daily.  . carboxymethylcellulose (REFRESH PLUS) 0.5 % SOLN Apply 2 drops to eye as needed (dry eyes).   . clopidogrel (PLAVIX) 75 MG tablet Take 75 mg by mouth daily.  Marland Kitchen Co-Enzyme Q-10 100 MG CAPS Take 100 mg by mouth daily.  . Cranberry-Vit C-Lactobacillus (RA CRANBERRY SUPPLEMENTS PO) Take 1 capsule by mouth 2 (two) times daily.  Marland Kitchen GLUCOSAMINE-CHONDROITIN-MSM PO Take 1 tablet by mouth daily.  . hydrochlorothiazide (MICROZIDE) 12.5 MG capsule Take 12.5 mg by mouth daily.  . Lutein-Zeaxanthin 25-5 MG CAPS Take 1 capsule by mouth daily.  . Melatonin-Chamomile 3-500 MG-MCG TABS Take 2 tablets by mouth at bedtime as needed (sleep).  . Memantine HCl-Donepezil HCl (NAMZARIC) 28-10 MG CP24 Take 1 capsule at bedtime by mouth.   . Menthol, Topical Analgesic, (BIOFREEZE) 4 % GEL Apply 1 application topically 2 (two)  times daily as needed (pain).   . Multiple Vitamin (MULTI-VITAMINS) TABS Take 1 tablet daily by mouth.   . Omega-3 Fatty Acids (OMEGA 3 PO) Take 1,000 mg 2 (two) times daily by mouth.   . pantoprazole (PROTONIX) 40 MG tablet Take 1 tablet (40 mg total) by mouth daily.  . polyethylene glycol (MIRALAX / GLYCOLAX) packet Take 17 g by mouth every other day.  . senna-docusate (SENOKOT-S) 8.6-50 MG tablet Take 2 tablets 2 (two) times daily by mouth.   . sertraline (ZOLOFT) 50 MG tablet Take 50 mg by mouth at bedtime.  Marland Kitchen telmisartan (MICARDIS) 80 MG tablet Take 80 mg daily by mouth.  . triamcinolone (NASACORT) 55 MCG/ACT AERO nasal inhaler Place 2 sprays into the nose at bedtime.  . vitamin C (ASCORBIC ACID) 500 MG tablet Take 500 mg by mouth daily.   No facility-administered encounter medications on file as of 07/17/2018.     Activities of Daily Living In your present state of health, do you have any difficulty performing the following activities: 07/17/2018 11/28/2017  Hearing? Tempie Donning  Vision? N N  Difficulty concentrating or making decisions? Tempie Donning  Walking or climbing stairs? Y Y  Dressing or bathing? Y Y  Doing errands, shopping? Tempie Donning  Preparing Food and eating ? Y -  Using the Toilet? Y -  In the past six months, have you accidently leaked urine? Y -  Comment foley catheter -  Do  you have problems with loss of bowel control? Y -  Managing your Medications? Y -  Managing your Finances? Y -  Housekeeping or managing your Housekeeping? Y -  Some recent data might be hidden    Patient Care Team: [name], DO as PCP - General (Geriatric Medicine) Danella Sensing, MD as Consulting Physician (Dermatology) Sharyne Peach, MD as Consulting Physician (Ophthalmology) Irine Seal, MD as Attending Physician (Urology) [name], MD as Consulting Physician (Neurology)   Assessment:   This is a routine wellness examination for Dolores.  Exercise Activities and Dietary  recommendations Current Exercise Habits: The patient does not participate in regular exercise at present, Exercise limited by: orthopedic condition(s);neurologic condition(s)  Goals   None     Fall Risk Fall Risk  07/17/2018 08/21/2017 04/04/2017 02/26/2017  Falls in the past year? No No No No   Is the patient's home free of loose throw rugs in walkways, pet beds, electrical cords, etc?   yes      Grab bars in the bathroom? yes      Handrails on the stairs?   yes      Adequate lighting?   yes  Depression Screen PHQ 2/9 Scores 07/17/2018 08/21/2017 04/04/2017  PHQ - 2 Score 0 1 0    Cognitive Function completed within the last year MMSE - Mini Mental State Exam 12/01/2017 01/17/2017  Orientation to time 1 4  Orientation to Place 5 5  Registration 2 3  Attention/ Calculation 0 0  Recall 0 0  Language- name 2 objects 2 2  Language- repeat 1 1  Language- follow 3 step command 3 3  Language- read & follow direction 1 0  Write a sentence 0 0  Copy design 0 0  Total score 15 18        Immunization History  Administered Date(s) Administered  . Influenza Inj Mdck Quad Pf 08/09/2017  . Influenza, High Dose Seasonal PF 07/31/2016  . Influenza-Unspecified 08/01/2010, 07/30/2012, 10/16/2012, 10/16/2013  . Pneumococcal Polysaccharide-23 02/23/2004, 02/21/2013  . Pneumococcal-Unspecified 03/17/2016  . Td 12/02/2008  . Tdap 09/01/2016  . Zoster 03/28/2006  . Zoster Recombinat (Shingrix) 10/29/2017, 01/28/2018    Qualifies for Shingles Vaccine? Up to date, completed  Screening Tests Health Maintenance  Topic Date Due  . INFLUENZA VACCINE  05/16/2018  . TETANUS/TDAP  09/01/2026  . PNA vac Low Risk Adult  Completed   Cancer Screenings: Lung: Low Dose CT Chest recommended if Age 27-80 years, 30 pack-year currently smoking OR have quit w/in 15years. Patient does not qualify. Colorectal: up to date  Additional Screenings:  Hepatitis C Screening: declined Flu vaccine due: will  receive at [facility]:    I have personally reviewed and addressed the Medicare Annual Wellness questionnaire and have noted the following in the patient's chart:  A. Medical and social history B. Use of alcohol, tobacco or illicit drugs  C. Current medications and supplements D. Functional ability and status E.  Nutritional status F.  Physical activity G. Advance directives H. List of other physicians I.  Hospitalizations, surgeries, and ER visits in previous 12 months J.  [place] to include hearing, vision, cognitive, depression L. Referrals and appointments - none  In addition, I have reviewed and discussed with patient certain preventive protocols, quality metrics, and best practice recommendations. A written personalized care plan for preventive services as well as general preventive health recommendations were provided to patient.  See attached scanned questionnaire  for additional information.   Signed,   Tyson Dense, RN Nurse Health Advisor  Patient Concerns: Patient's wife asked for an ordered to 16 oz of fluids in the afternoon and for it to be supervised by staff

## 2018-07-17 NOTE — Patient Instructions (Addendum)
Kenneth Stark , Thank you for taking time to come for your Medicare Wellness Visit. I appreciate your ongoing commitment to your health goals. Please review the following plan we discussed and let me know if I can assist you in the future.   Screening recommendations/referrals: Colonoscopy excluded, over age 82 Recommended yearly ophthalmology/optometry visit for glaucoma screening and checkup Recommended yearly dental visit for hygiene and checkup  Vaccinations: Influenza vaccine due, will receive at [facility] Pneumococcal vaccine up to date, completed Tdap vaccine up to date, due 09/01/2026 Shingles vaccine up to date, completed    Advanced directives: in chart  Conditions/risks identified: none  Next appointment: Dr. Mariea Clonts makes rounds  Preventive Care 82 Years and Older, Male Preventive care refers to lifestyle choices and visits with your health care provider that can promote health and wellness. What does preventive care include?  A yearly physical exam. This is also called an annual well check.  Dental exams once or twice a year.  Routine eye exams. Ask your health care provider how often you should have your eyes checked.  Personal lifestyle choices, including:  Daily care of your teeth and gums.  Regular physical activity.  Eating a healthy diet.  Avoiding tobacco and drug use.  Limiting alcohol use.  Practicing safe sex.  Taking low doses of aspirin every day.  Taking vitamin and mineral supplements as recommended by your health care provider. What happens during an annual well check? The services and screenings done by your health care provider during your annual well check will depend on your age, overall health, lifestyle risk factors, and family history of disease. Counseling  Your health care provider may ask you questions about your:  Alcohol use.  Tobacco use.  Drug use.  Emotional well-being.  Home and relationship well-being.  Sexual  activity.  Eating habits.  History of falls.  Memory and ability to understand (cognition).  Work and work Statistician. Screening  You may have the following tests or measurements:  Height, weight, and BMI.  Blood pressure.  Lipid and cholesterol levels. These may be checked every 5 years, or more frequently if you are over 20 years old.  Skin check.  Lung cancer screening. You may have this screening every year starting at age 103 if you have a 30-pack-year history of smoking and currently smoke or have quit within the past 15 years.  Fecal occult blood test (FOBT) of the stool. You may have this test every year starting at age 17.  Flexible sigmoidoscopy or colonoscopy. You may have a sigmoidoscopy every 5 years or a colonoscopy every 10 years starting at age 37.  Prostate cancer screening. Recommendations will vary depending on your family history and other risks.  Hepatitis C blood test.  Hepatitis B blood test.  Sexually transmitted disease (STD) testing.  Diabetes screening. This is done by checking your blood sugar (glucose) after you have not eaten for a while (fasting). You may have this done every 1-3 years.  Abdominal aortic aneurysm (AAA) screening. You may need this if you are a current or former smoker.  Osteoporosis. You may be screened starting at age 27 if you are at high risk. Talk with your health care provider about your test results, treatment options, and if necessary, the need for more tests. Vaccines  Your health care provider may recommend certain vaccines, such as:  Influenza vaccine. This is recommended every year.  Tetanus, diphtheria, and acellular pertussis (Tdap, Td) vaccine. You may need a Td booster  every 10 years.  Zoster vaccine. You may need this after age 22.  Pneumococcal 13-valent conjugate (PCV13) vaccine. One dose is recommended after age 14.  Pneumococcal polysaccharide (PPSV23) vaccine. One dose is recommended after age  75. Talk to your health care provider about which screenings and vaccines you need and how often you need them. This information is not intended to replace advice given to you by your health care provider. Make sure you discuss any questions you have with your health care provider. Document Released: 10/29/2015 Document Revised: 06/21/2016 Document Reviewed: 08/03/2015 Elsevier Interactive Patient Education  2017 [place] Prevention in the Home Falls can cause injuries. They can happen to people of all ages. There are many things you can do to make your home safe and to help prevent falls. What can I do on the outside of my home?  Regularly fix the edges of walkways and driveways and fix any cracks.  Remove anything that might make you trip as you walk through a door, such as a raised step or threshold.  Trim any bushes or trees on the path to your home.  Use bright outdoor lighting.  Clear any walking paths of anything that might make someone trip, such as rocks or tools.  Regularly check to see if handrails are loose or broken. Make sure that both sides of any steps have handrails.  Any raised decks and porches should have guardrails on the edges.  Have any leaves, snow, or ice cleared regularly.  Use sand or salt on walking paths during winter.  Clean up any spills in your garage right away. This includes oil or grease spills. What can I do in the bathroom?  Use night lights.  Install grab bars by the toilet and in the tub and shower. Do not use towel bars as grab bars.  Use non-skid mats or decals in the tub or shower.  If you need to sit down in the shower, use a plastic, non-slip stool.  Keep the floor dry. Clean up any water that spills on the floor as soon as it happens.  Remove soap buildup in the tub or shower regularly.  Attach bath mats securely with double-sided non-slip rug tape.  Do not have throw rugs and other things on the floor that can make  you trip. What can I do in the bedroom?  Use night lights.  Make sure that you have a light by your bed that is easy to reach.  Do not use any sheets or blankets that are too big for your bed. They should not hang down onto the floor.  Have a firm chair that has side arms. You can use this for support while you get dressed.  Do not have throw rugs and other things on the floor that can make you trip. What can I do in the kitchen?  Clean up any spills right away.  Avoid walking on wet floors.  Keep items that you use a lot in easy-to-reach places.  If you need to reach something above you, use a strong step stool that has a grab bar.  Keep electrical cords out of the way.  Do not use floor polish or wax that makes floors slippery. If you must use wax, use non-skid floor wax.  Do not have throw rugs and other things on the floor that can make you trip. What can I do with my stairs?  Do not leave any items on the stairs.  Make  sure that there are handrails on both sides of the stairs and use them. Fix handrails that are broken or loose. Make sure that handrails are as long as the stairways.  Check any carpeting to make sure that it is firmly attached to the stairs. Fix any carpet that is loose or worn.  Avoid having throw rugs at the top or bottom of the stairs. If you do have throw rugs, attach them to the floor with carpet tape.  Make sure that you have a light switch at the top of the stairs and the bottom of the stairs. If you do not have them, ask someone to add them for you. What else can I do to help prevent falls?  Wear shoes that:  Do not have high heels.  Have rubber bottoms.  Are comfortable and fit you well.  Are closed at the toe. Do not wear sandals.  If you use a stepladder:  Make sure that it is fully opened. Do not climb a closed stepladder.  Make sure that both sides of the stepladder are locked into place.  Ask someone to hold it for you, if  possible.  Clearly mark and make sure that you can see:  Any grab bars or handrails.  First and last steps.  Where the edge of each step is.  Use tools that help you move around (mobility aids) if they are needed. These include:  Canes.  Walkers.  Scooters.  Crutches.  Turn on the lights when you go into a dark area. Replace any light bulbs as soon as they burn out.  Set up your furniture so you have a clear path. Avoid moving your furniture around.  If any of your floors are uneven, fix them.  If there are any pets around you, be aware of where they are.  Review your medicines with your doctor. Some medicines can make you feel dizzy. This can increase your chance of falling. Ask your doctor what other things that you can do to help prevent falls. This information is not intended to replace advice given to you by your health care provider. Make sure you discuss any questions you have with your health care provider. Document Released: 07/29/2009 Document Revised: 03/09/2016 Document Reviewed: 11/06/2014 Elsevier Interactive Patient Education  2017 [employer].

## 2018-07-22 DIAGNOSIS — G214 Vascular parkinsonism: Secondary | ICD-10-CM | POA: Diagnosis not present

## 2018-07-22 DIAGNOSIS — G20 Parkinson's disease: Secondary | ICD-10-CM | POA: Diagnosis not present

## 2018-07-22 DIAGNOSIS — M47816 Spondylosis without myelopathy or radiculopathy, lumbar region: Secondary | ICD-10-CM | POA: Diagnosis not present

## 2018-07-22 DIAGNOSIS — L89612 Pressure ulcer of right heel, stage 2: Secondary | ICD-10-CM | POA: Diagnosis not present

## 2018-07-22 DIAGNOSIS — M6389 Disorders of muscle in diseases classified elsewhere, multiple sites: Secondary | ICD-10-CM | POA: Diagnosis not present

## 2018-07-22 DIAGNOSIS — L89622 Pressure ulcer of left heel, stage 2: Secondary | ICD-10-CM | POA: Diagnosis not present

## 2018-07-24 DIAGNOSIS — M6389 Disorders of muscle in diseases classified elsewhere, multiple sites: Secondary | ICD-10-CM | POA: Diagnosis not present

## 2018-07-24 DIAGNOSIS — G214 Vascular parkinsonism: Secondary | ICD-10-CM | POA: Diagnosis not present

## 2018-07-24 DIAGNOSIS — L89622 Pressure ulcer of left heel, stage 2: Secondary | ICD-10-CM | POA: Diagnosis not present

## 2018-07-24 DIAGNOSIS — M47816 Spondylosis without myelopathy or radiculopathy, lumbar region: Secondary | ICD-10-CM | POA: Diagnosis not present

## 2018-07-24 DIAGNOSIS — L89612 Pressure ulcer of right heel, stage 2: Secondary | ICD-10-CM | POA: Diagnosis not present

## 2018-07-24 DIAGNOSIS — G20 Parkinson's disease: Secondary | ICD-10-CM | POA: Diagnosis not present

## 2018-07-25 ENCOUNTER — Ambulatory Visit: Payer: Medicare Other | Admitting: Neurology

## 2018-07-25 ENCOUNTER — Encounter: Payer: Self-pay | Admitting: Neurology

## 2018-07-25 VITALS — BP 130/62 | HR 68

## 2018-07-25 DIAGNOSIS — F0150 Vascular dementia without behavioral disturbance: Secondary | ICD-10-CM | POA: Diagnosis not present

## 2018-07-25 DIAGNOSIS — G20 Parkinson's disease: Secondary | ICD-10-CM

## 2018-07-25 DIAGNOSIS — F0280 Dementia in other diseases classified elsewhere without behavioral disturbance: Secondary | ICD-10-CM

## 2018-07-25 DIAGNOSIS — G309 Alzheimer's disease, unspecified: Secondary | ICD-10-CM | POA: Diagnosis not present

## 2018-07-25 DIAGNOSIS — R269 Unspecified abnormalities of gait and mobility: Secondary | ICD-10-CM | POA: Diagnosis not present

## 2018-07-25 NOTE — Progress Notes (Signed)
Reason for visit: Dementia, gait disorder  Kenneth Stark is an 84 y.o. male  History of present illness:  Kenneth Stark is a 84 year old right-handed white male with a history of a progressive dementia.  The patient is felt to have some parkinsonism, but in the past reductions of his Sinemet resulted in increased stiffness and the medication has been maintained at the current level.  The patient has not had any falls since last seen, he is essentially nonambulatory, he has assistance with standing and transfers, he has a Engineer, drilling lift as well.  The patient needs assistance with bathing and dressing.  He sleeps fairly well at night, he takes a nap around 2 PM every day.  He is able to eat and drink fairly well, he does have some problems with urinary tract infections off and on, he has an indwelling catheter.  He is on vitamin C and cranberry tablets.  The patient returns to this office for an evaluation.  He remains on Namzeric for his dementia.  Past Medical History:  Diagnosis Date  . Abnormality of gait   . Arthritis   . Brain bleed ([place]) 09/01/2016  . Cervical spondylosis   . Degenerative joint disease (DJD) of lumbar spine   . Depression   . Diplopia   . Dyslipidemia   . Essential tremor   . Foul smelling urine   . Frequent falls   . GERD (gastroesophageal reflux disease)   . Glycosuria   . Hearing difficulty    hearing aids  . Hyperlipidemia   . Hypertension   . Hyperthyroidism   . Memory loss   . Osteoarthritis   . Radiculopathy, lumbar region   . 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 Small vessel disease ([place])   . TIA (transient ischemic attack)     Past Surgical History:  Procedure Laterality Date  . APPENDECTOMY     done as a child  . GALLBLADDER SURGERY  2008  . KNEE ARTHROSCOPY Left    Dr. Hart Robinsons 2002  . knee injections Right    Dr. Adriana Mccallum  . TONSILLECTOMY     done as a child    Family History  Problem Relation  Age of Onset  . Stroke Mother   . Dementia Mother   . Stroke Father   . Heart disease Father   . Diabetes Father   . Dementia Brother   . Renal Disease Brother   . Diabetes Brother   . Renal Disease Daughter     Social history:  reports that he has never smoked. He has never used smokeless tobacco. He reports that he drinks alcohol. He reports that he does not use drugs.   No Known Allergies  Medications:  Prior to Admission medications   Medication Sig Start Date End Date Taking? Authorizing Provider  albuterol (PROVENTIL) (2.5 MG/3ML) 0.083% nebulizer solution Take 3 mLs (2.5 mg total) by nebulization every 2 (two) hours as needed for wheezing or shortness of breath. 11/30/17  Yes Ghimire, Henreitta Leber, MD  amLODipine (NORVASC) 10 MG tablet Take 1 tablet (10 mg total) by mouth daily. 05/29/17  Yes Reed, Tiffany L, DO  ammonium lactate (AMLACTIN) 12 % cream Apply topically as needed for dry skin.   Yes [provider]  aspirin EC 81 MG tablet Take 81 mg by mouth daily.   Yes [provider]  Calcium Carb-Cholecalciferol 600-800 MG-UNIT CHEW Chew 1 tablet  by mouth 2 (two) times daily.    Yes [provider]  carbidopa-levodopa (SINEMET IR) 25-100 MG tablet Take 1.5 tablets by mouth 3 (three) times daily. 09/18/17  Yes [name], MD  carboxymethylcellulose (REFRESH PLUS) 0.5 % SOLN Apply 2 drops to eye as needed (dry eyes).    Yes [provider]  clopidogrel (PLAVIX) 75 MG tablet Take 75 mg by mouth daily.   Yes [provider]  Co-Enzyme Q-10 100 MG CAPS Take 100 mg by mouth daily.   Yes [provider]  Cranberry-Vit C-Lactobacillus (RA CRANBERRY SUPPLEMENTS PO) Take 1 capsule by mouth 2 (two) times daily.   Yes [provider]  GLUCOSAMINE-CHONDROITIN-MSM PO Take 1 tablet by mouth daily.   Yes [provider]  hydrochlorothiazide (MICROZIDE) 12.5 MG capsule Take 12.5 mg by mouth daily.   Yes [provider]  Lutein-Zeaxanthin 25-5 MG CAPS Take 1 capsule by mouth daily.   Yes [provider]  Melatonin-Chamomile 3-500 MG-MCG TABS Take 2 tablets by mouth at bedtime as needed (sleep).   Yes [provider]  Memantine HCl-Donepezil HCl (NAMZARIC) 28-10 MG CP24 Take 1 capsule at bedtime by mouth.    Yes [provider]  Menthol, Topical Analgesic, (BIOFREEZE) 4 % GEL Apply 1 application topically 2 (two) times daily as needed (pain).    Yes [provider]  Multiple Vitamin (MULTI-VITAMINS) TABS Take 1 tablet daily by mouth.    Yes [provider]  Omega-3 Fatty Acids (OMEGA 3 PO) Take 1,000 mg 2 (two) times daily by mouth.    Yes [provider]  pantoprazole (PROTONIX) 40 MG tablet Take 1 tablet (40 mg total) by mouth daily. 11/30/17 11/30/18 Yes Ghimire, Henreitta Leber, MD  polyethylene glycol (MIRALAX / GLYCOLAX) packet Take 17 g by mouth every other day.   Yes [provider]  senna-docusate (SENOKOT-S) 8.6-50 MG tablet Take 2 tablets 2 (two) times daily by mouth.    Yes [provider]  sertraline (ZOLOFT) 50 MG tablet Take 50 mg by mouth at bedtime.   Yes [provider]  telmisartan (MICARDIS) 80 MG tablet Take 80 mg daily by mouth.   Yes [provider]  triamcinolone (NASACORT) 55 MCG/ACT AERO nasal inhaler Place 2 sprays into the nose at bedtime.   Yes [provider]  vitamin C (ASCORBIC ACID) 500 MG tablet Take 500 mg by mouth daily.   Yes [provider]    ROS:  Out of a complete 14 system review of symptoms, the patient complains only of the following symptoms, and all other reviewed systems are negative.  Itching Cold intolerance  Blood pressure 130/62, pulse 68.  Physical Exam  General: The patient is alert and cooperative at the time of the examination.  Skin: No significant peripheral edema is noted.   Neurologic Exam  Mental status: The patient is alert  and cooperative, he is oriented to person and place, not date.   Cranial nerves: Facial symmetry is present. Speech is normal, no aphasia or dysarthria is noted. Extraocular movements are full. Visual fields are full.  Motor: The patient has good strength in all 4 extremities.  Sensory examination: Soft touch sensation is symmetric on the face, arms, and legs.  Coordination: The patient has good finger-nose-finger bilaterally, he has some difficulty with heel-to-shin bilaterally.  Gait and station: The patient is not Dealer, he is wheelchair-bound.  Reflexes: Deep tendon reflexes are symmetric.  The deep tendon reflexes involving the  knees are somewhat brisk.   Assessment/Plan:  1.  Progressive dementia  2.  Gait disorder  3.  Parkinsonism  The patient will remain on Sinemet and Namzeric for now.  He will follow-up in about 6 months.  The patient appears to be relatively stable from his last visit.  The patient is in a motorized wheelchair, apparently he requires some supervision with operation of this.  Greater than 50% of the visit was spent in counseling and coordination of care.  Face-to-face time with the patient was 20 minutes.   Jill Alexanders MD 07/25/2018 11:28 AM  [facility] [address] [place], [place] [address]  Phone [phone] Fax [phone]

## 2018-07-26 ENCOUNTER — Encounter: Payer: Self-pay | Admitting: Adult Health

## 2018-07-26 ENCOUNTER — Non-Acute Institutional Stay (SKILLED_NURSING_FACILITY): Payer: Medicare Other | Admitting: Adult Health

## 2018-07-26 DIAGNOSIS — F0280 Dementia in other diseases classified elsewhere without behavioral disturbance: Secondary | ICD-10-CM

## 2018-07-26 DIAGNOSIS — I10 Essential (primary) hypertension: Secondary | ICD-10-CM | POA: Diagnosis not present

## 2018-07-26 DIAGNOSIS — R338 Other retention of urine: Secondary | ICD-10-CM

## 2018-07-26 DIAGNOSIS — N401 Enlarged prostate with lower urinary tract symptoms: Secondary | ICD-10-CM

## 2018-07-26 DIAGNOSIS — G309 Alzheimer's disease, unspecified: Secondary | ICD-10-CM

## 2018-07-26 DIAGNOSIS — M6389 Disorders of muscle in diseases classified elsewhere, multiple sites: Secondary | ICD-10-CM | POA: Diagnosis not present

## 2018-07-26 DIAGNOSIS — L89612 Pressure ulcer of right heel, stage 2: Secondary | ICD-10-CM | POA: Diagnosis not present

## 2018-07-26 DIAGNOSIS — M47816 Spondylosis without myelopathy or radiculopathy, lumbar region: Secondary | ICD-10-CM | POA: Diagnosis not present

## 2018-07-26 DIAGNOSIS — G20 Parkinson's disease: Secondary | ICD-10-CM | POA: Diagnosis not present

## 2018-07-26 DIAGNOSIS — L304 Erythema intertrigo: Secondary | ICD-10-CM

## 2018-07-26 DIAGNOSIS — L89622 Pressure ulcer of left heel, stage 2: Secondary | ICD-10-CM | POA: Diagnosis not present

## 2018-07-26 DIAGNOSIS — G214 Vascular parkinsonism: Secondary | ICD-10-CM | POA: Diagnosis not present

## 2018-07-26 DIAGNOSIS — F0150 Vascular dementia without behavioral disturbance: Secondary | ICD-10-CM

## 2018-07-26 NOTE — Progress Notes (Signed)
Location:  Occupational psychologist of Service:  SNF (31) Provider:   Cindi Carbon, ANP [place] [phone]   [name], DO  Patient Care Team: [name], DO as PCP - General (Geriatric Medicine) Danella Sensing, MD as Consulting Physician (Dermatology) Sharyne Peach, MD as Consulting Physician (Ophthalmology) Irine Seal, MD as Attending Physician (Urology) [name], MD as Consulting Physician (Neurology)  Extended Emergency Contact Information Primary Emergency Contact: Mungin,[name] Address: [address]          [place] [address] [name] of [place] Phone: [phone] Relation: Spouse Secondary Emergency Contact: [name] States of [place] Mobile Phone: [phone] Relation: Daughter  Code Status:  DNR Goals of care: Advanced Directive information Advanced Directives 07/17/2018  Does Patient Have a Medical Advance Directive? Yes  Type of Paramedic of [place];Living will;Out of facility DNR (pink MOST or yellow form)  Does patient want to make changes to medical advance directive? No - Patient declined  Copy of [place] in Chart? Yes  Would patient like information on creating a medical advance directive? -  Pre-existing out of facility DNR order (yellow form or pink MOST form) Yellow form placed in chart (order not valid for inpatient use)     Chief Complaint  Patient presents with  . Acute Visit    scrotal rash and discomfort    HPI:  Pt is a 84 y.o. male seen today for an acute visit for scrotal rash and discomfort. The staff first noticed these symptoms today. He has a hx of BPH with a foley catheter and is incontinent of stool at times. He denies any urinary symptoms. He was treated with Cipro in Sept for a UTI.  He states his scrotal area is painful to touch and feels irritated. The nurse reports the skin is reddened and raw and  there has been a small amt of blood in his brief due to the irritation. Vitals are stable with BP ranging mostly in the 120-140 range. He was seen by Dr. Jannifer Franklin on 10/10 for a hx of mixed progressive dementia, gait disorder, and parkinsonism. He is currently on sinemet and namzaric. No issues with rigidity or tremor at this time. MMSE 15/30 12/01/17  Functional status: hoyer lift, incontinent   Past Medical History:  Diagnosis Date  . Abnormality of gait   . Arthritis   . Brain bleed ([place]) 09/01/2016  . Cervical spondylosis   . Degenerative joint disease (DJD) of lumbar spine   . Depression   . Diplopia   . Dyslipidemia   . Essential tremor   . Foul smelling urine   . Frequent falls   . GERD (gastroesophageal reflux disease)   . Glycosuria   . Hearing difficulty    hearing aids  . Hyperlipidemia   . Hypertension   . Hyperthyroidism   . Memory loss   . Osteoarthritis   . Radiculopathy, lumbar region   . 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 Small vessel disease ([place])   . TIA (transient ischemic attack)    Past Surgical History:  Procedure Laterality Date  . APPENDECTOMY     done as a child  . GALLBLADDER SURGERY  2008  . KNEE ARTHROSCOPY Left    Dr. Hart Robinsons 2002  . knee injections Right    Dr. Adriana Mccallum  . TONSILLECTOMY     done as  a child    No Known Allergies  Outpatient Encounter Medications as of 07/26/2018  Medication Sig  . albuterol (PROVENTIL) (2.5 MG/3ML) 0.083% nebulizer solution Take 3 mLs (2.5 mg total) by nebulization every 2 (two) hours as needed for wheezing or shortness of breath.  Marland Kitchen amLODipine (NORVASC) 10 MG tablet Take 1 tablet (10 mg total) by mouth daily.  Marland Kitchen ammonium lactate (AMLACTIN) 12 % cream Apply topically as needed for dry skin.  Marland Kitchen aspirin EC 81 MG tablet Take 81 mg by mouth daily.  . Calcium Carb-Cholecalciferol 600-800 MG-UNIT CHEW Chew 1 tablet by mouth 2 (two) times daily.   . carbidopa-levodopa  (SINEMET IR) 25-100 MG tablet Take 1.5 tablets by mouth 3 (three) times daily.  . carboxymethylcellulose (REFRESH PLUS) 0.5 % SOLN Apply 2 drops to eye as needed (dry eyes).   . clopidogrel (PLAVIX) 75 MG tablet Take 75 mg by mouth daily.  Marland Kitchen Co-Enzyme Q-10 100 MG CAPS Take 100 mg by mouth daily.  . Cranberry-Vit C-Lactobacillus (RA CRANBERRY SUPPLEMENTS PO) Take 1 capsule by mouth 2 (two) times daily.  Marland Kitchen GLUCOSAMINE-CHONDROITIN-MSM PO Take 1 tablet by mouth daily.  . hydrochlorothiazide (MICROZIDE) 12.5 MG capsule Take 12.5 mg by mouth daily.  . Lutein-Zeaxanthin 25-5 MG CAPS Take 1 capsule by mouth daily.  . Melatonin-Chamomile 3-500 MG-MCG TABS Take 2 tablets by mouth at bedtime as needed (sleep).  . Memantine HCl-Donepezil HCl (NAMZARIC) 28-10 MG CP24 Take 1 capsule at bedtime by mouth.   . Menthol, Topical Analgesic, (BIOFREEZE) 4 % GEL Apply 1 application topically 2 (two) times daily as needed (pain).   . Multiple Vitamin (MULTI-VITAMINS) TABS Take 1 tablet daily by mouth.   . Omega-3 Fatty Acids (OMEGA 3 PO) Take 1,000 mg 2 (two) times daily by mouth.   . pantoprazole (PROTONIX) 40 MG tablet Take 1 tablet (40 mg total) by mouth daily.  . polyethylene glycol (MIRALAX / GLYCOLAX) packet Take 17 g by mouth every other day.  . senna-docusate (SENOKOT-S) 8.6-50 MG tablet Take 2 tablets 2 (two) times daily by mouth.   . sertraline (ZOLOFT) 50 MG tablet Take 50 mg by mouth at bedtime.  Marland Kitchen telmisartan (MICARDIS) 80 MG tablet Take 80 mg daily by mouth.  . triamcinolone (NASACORT) 55 MCG/ACT AERO nasal inhaler Place 2 sprays into the nose at bedtime.  . vitamin C (ASCORBIC ACID) 500 MG tablet Take 500 mg by mouth daily.   No facility-administered encounter medications on file as of 07/26/2018.     Review of Systems  Unable to perform ROS: Dementia    Immunization History  Administered Date(s) Administered  . Influenza Inj Mdck Quad Pf 08/09/2017  . Influenza, High Dose Seasonal PF  07/31/2016  . Influenza-Unspecified 08/01/2010, 07/30/2012, 10/16/2012, 10/16/2013  . Pneumococcal Polysaccharide-23 02/23/2004, 02/21/2013  . Pneumococcal-Unspecified 03/17/2016  . Td 12/02/2008  . Tdap 09/01/2016  . Zoster 03/28/2006  . Zoster Recombinat (Shingrix) 10/29/2017, 01/28/2018   Pertinent  Health Maintenance Due  Topic Date Due  . INFLUENZA VACCINE  05/16/2018  . PNA vac Low Risk Adult  Completed   Fall Risk  07/17/2018 08/21/2017 04/04/2017 02/26/2017  Falls in the past year? No No No No   Functional Status Survey:    There were no vitals filed for this visit. There is no height or weight on file to calculate BMI. Physical Exam  Constitutional: No distress.  HENT:  Head: Normocephalic and atraumatic.  Neck: No JVD present. No tracheal deviation present. No thyromegaly present.  Cardiovascular:  Normal rate and regular rhythm.  No murmur heard. Pulmonary/Chest: Effort normal and breath sounds normal. No respiratory distress. He has no wheezes.  Abdominal: Soft. Bowel sounds are normal. He exhibits no distension. There is no tenderness.  Lymphadenopathy:    He has no cervical adenopathy.  Neurological: He is alert. No cranial nerve deficit.  Oriented x 2, MAE, able to f/c  Skin: Skin is warm and dry. Rash (noted to scrotal area and inner thighs, tender to touch) noted. He is not diaphoretic. There is erythema.  Psychiatric: He has a normal mood and affect.    Labs reviewed: Recent Labs    11/28/17 0026 11/28/17 0513 11/29/17 0608 05/20/18  NA 138 139 143 139  K 2.8* 2.9* 3.8 4.2  CL 100* 105 110  --   CO2 24 22 24   --   GLUCOSE 226* 252* 77  --   BUN 25* 23* 19 16  CREATININE 1.07 1.04 0.85 1.0  CALCIUM 9.2 8.3* 8.3*  --   MG  --  1.4* 2.0  --    Recent Labs    11/28/17 0026  AST 25  ALT 9*  ALKPHOS 61  BILITOT 0.7  PROT 6.3*  ALBUMIN 3.1*   Recent Labs    11/28/17 0026 11/28/17 0513 11/29/17 0608 05/20/18  WBC 15.9* 15.4* 9.0 9.1    NEUTROABS 13.4*  --   --   --   HGB 11.5* 9.7* 10.5* 12.6*  HCT 34.0* 28.1* 31.8* 39*  MCV 91.9 91.5 93.0  --   PLT 215 199 195 318   Lab Results  Component Value Date   TSH 3.14 01/18/2018   Lab Results  Component Value Date   HGBA1C 6.8 (H) 09/07/2016   Lab Results  Component Value Date   CHOL 127 05/11/2017   HDL 50 05/11/2017   LDLCALC 58 05/11/2017   TRIG 98 05/11/2017    Significant Diagnostic Results in last 30 days:  No results found.  Assessment/Plan  1. Intertrigo Significant noted to scrotal area and inner thighs Diflucan 150 mg x 1 dose Lotrisone apply bid for 10 days and keep area dry  2. Essential hypertension Continue Norvasc 10 mg qd, micardis 80 mg qd, and hctz 12.5 mg qd  3. Mixed Alzheimer's and vascular dementia ([place]) Followed by Dr. Jannifer Franklin Continue supportive environment in the skilled care setting Continue Namzaric  4. Benign prostatic hyperplasia with urinary retention With foley catheter Continue catheter care per [facility] protocol  Denies urinary symptoms    Family/ staff Communication: discussed with staff, resident and his wife [name]  Labs/tests ordered:  NA

## 2018-07-31 DIAGNOSIS — H2513 Age-related nuclear cataract, bilateral: Secondary | ICD-10-CM | POA: Diagnosis not present

## 2018-07-31 DIAGNOSIS — H524 Presbyopia: Secondary | ICD-10-CM | POA: Diagnosis not present

## 2018-07-31 DIAGNOSIS — H02002 Unspecified entropion of right lower eyelid: Secondary | ICD-10-CM | POA: Diagnosis not present

## 2018-08-29 ENCOUNTER — Non-Acute Institutional Stay (SKILLED_NURSING_FACILITY): Payer: Medicare Other | Admitting: Adult Health

## 2018-08-29 DIAGNOSIS — I10 Essential (primary) hypertension: Secondary | ICD-10-CM

## 2018-08-29 DIAGNOSIS — I679 Cerebrovascular disease, unspecified: Secondary | ICD-10-CM | POA: Diagnosis not present

## 2018-08-29 DIAGNOSIS — N401 Enlarged prostate with lower urinary tract symptoms: Secondary | ICD-10-CM | POA: Diagnosis not present

## 2018-08-29 DIAGNOSIS — G20 Parkinson's disease: Secondary | ICD-10-CM | POA: Diagnosis not present

## 2018-08-29 DIAGNOSIS — F324 Major depressive disorder, single episode, in partial remission: Secondary | ICD-10-CM

## 2018-08-29 DIAGNOSIS — G20C Parkinsonism, unspecified: Secondary | ICD-10-CM

## 2018-08-29 DIAGNOSIS — R338 Other retention of urine: Secondary | ICD-10-CM

## 2018-08-31 ENCOUNTER — Encounter: Payer: Self-pay | Admitting: Adult Health

## 2018-08-31 NOTE — Progress Notes (Signed)
Location:  Occupational psychologist of Service:  SNF (31) Provider:   Cindi Carbon, ANP [place] [phone]   [name], DO  Patient Care Team: [name], DO as PCP - General (Geriatric Medicine) Danella Sensing, MD as Consulting Physician (Dermatology) Sharyne Peach, MD as Consulting Physician (Ophthalmology) Irine Seal, MD as Attending Physician (Urology) [name], MD as Consulting Physician (Neurology)  Extended Emergency Contact Information Primary Emergency Contact: Luepke,[name] Address: [address]          [place] [address] [name] of [place] Phone: [phone] Relation: Spouse Secondary Emergency Contact: [name] States of [place] Mobile Phone: [phone] Relation: Daughter  Code Status:  DNR Goals of care: Advanced Directive information Advanced Directives 07/17/2018  Does Patient Have a Medical Advance Directive? Yes  Type of Paramedic of [place];Living will;Out of facility DNR (pink MOST or yellow form)  Does patient want to make changes to medical advance directive? No - Patient declined  Copy of [place] in Chart? Yes  Would patient like information on creating a medical advance directive? -  Pre-existing out of facility DNR order (yellow form or pink MOST form) Yellow form placed in chart (order not valid for inpatient use)     Chief Complaint  Patient presents with  . Medical Management of Chronic Issues    HPI:  Pt is a 84 y.o. male seen today for medical management of chronic diseases.  He resides in the skilled care unit. The staff report that he is in his usual state of health. VS are stable. No complaints.  His bowels are moving regularly with a good appetite.   Depression: seems to be doing better on zoloft but given his advanced dementia this is difficult to assess  HTN: controlled  Cerebrovascular dz:on  plavix and asa with a hx of TIA MMSE 15/30 12/01/17  Urinary retention due to BPH: has a foley cath chronically. No urinary symptoms. Takes cranberry supplements for UTI prevention  Parkinsonism: takes sinemet with no reports of tremor or rigidity. Seem to do worse with rigidity when this med was tapered in the past.   Functional status: hoyer lift for transfers, incontinent  Past Medical History:  Diagnosis Date  . Abnormality of gait   . Arthritis   . Brain bleed ([place]) 09/01/2016  . Cervical spondylosis   . Degenerative joint disease (DJD) of lumbar spine   . Depression   . Diplopia   . Dyslipidemia   . Essential tremor   . Foul smelling urine   . Frequent falls   . GERD (gastroesophageal reflux disease)   . Glycosuria   . Hearing difficulty    hearing aids  . Hyperlipidemia   . Hypertension   . Hyperthyroidism   . Memory loss   . Osteoarthritis   . Radiculopathy, lumbar region   . 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 Small vessel disease ([place])   . TIA (transient ischemic attack)    Past Surgical History:  Procedure Laterality Date  . APPENDECTOMY     done as a child  . GALLBLADDER SURGERY  2008  . KNEE ARTHROSCOPY Left    Dr. Hart Robinsons 2002  . knee injections Right    Dr. Adriana Mccallum  . TONSILLECTOMY     done as a child    No Known Allergies  Outpatient Encounter Medications as of  08/29/2018  Medication Sig  . albuterol (PROVENTIL) (2.5 MG/3ML) 0.083% nebulizer solution Take 3 mLs (2.5 mg total) by nebulization every 2 (two) hours as needed for wheezing or shortness of breath.  Marland Kitchen amLODipine (NORVASC) 10 MG tablet Take 1 tablet (10 mg total) by mouth daily.  Marland Kitchen ammonium lactate (AMLACTIN) 12 % cream Apply topically as needed for dry skin.  Marland Kitchen aspirin EC 81 MG tablet Take 81 mg by mouth daily.  . Calcium Carb-Cholecalciferol 600-800 MG-UNIT CHEW Chew 1 tablet by mouth 2 (two) times daily.   . carbidopa-levodopa (SINEMET IR) 25-100  MG tablet Take 1.5 tablets by mouth 3 (three) times daily.  . Memantine HCl-Donepezil HCl (NAMZARIC) 28-10 MG CP24 Take 1 capsule at bedtime by mouth.   . sertraline (ZOLOFT) 50 MG tablet Take 50 mg by mouth at bedtime.  Marland Kitchen telmisartan (MICARDIS) 80 MG tablet Take 80 mg daily by mouth.  . triamcinolone (NASACORT) 55 MCG/ACT AERO nasal inhaler Place 2 sprays into the nose at bedtime.  . vitamin C (ASCORBIC ACID) 500 MG tablet Take 500 mg by mouth daily.  . carboxymethylcellulose (REFRESH PLUS) 0.5 % SOLN Apply 2 drops to eye as needed (dry eyes).   . clopidogrel (PLAVIX) 75 MG tablet Take 75 mg by mouth daily.  Marland Kitchen Co-Enzyme Q-10 100 MG CAPS Take 100 mg by mouth daily.  . Cranberry-Vit C-Lactobacillus (RA CRANBERRY SUPPLEMENTS PO) Take 1 capsule by mouth 3 (three) times daily.   Marland Kitchen GLUCOSAMINE-CHONDROITIN-MSM PO Take 1 tablet by mouth daily.  . hydrochlorothiazide (MICROZIDE) 12.5 MG capsule Take 12.5 mg by mouth daily.  . Lutein-Zeaxanthin 25-5 MG CAPS Take 1 capsule by mouth daily.  . Melatonin-Chamomile 3-500 MG-MCG TABS Take 2 tablets by mouth at bedtime as needed (sleep).  . Menthol, Topical Analgesic, (BIOFREEZE) 4 % GEL Apply 1 application topically 2 (two) times daily as needed (pain).   . Multiple Vitamin (MULTI-VITAMINS) TABS Take 1 tablet daily by mouth.   . Omega-3 Fatty Acids (OMEGA 3 PO) Take 1,000 mg 2 (two) times daily by mouth.   . pantoprazole (PROTONIX) 40 MG tablet Take 1 tablet (40 mg total) by mouth daily.  . polyethylene glycol (MIRALAX / GLYCOLAX) packet Take 17 g by mouth every other day.  . senna-docusate (SENOKOT-S) 8.6-50 MG tablet Take 2 tablets 2 (two) times daily by mouth.    No facility-administered encounter medications on file as of 08/29/2018.     Review of Systems  Unable to perform ROS: Dementia  Constitutional: Negative for activity change, appetite change, chills, diaphoresis, fatigue, fever and unexpected weight change.  Respiratory: Negative for cough,  shortness of breath, wheezing and stridor.   Cardiovascular: Negative for chest pain, palpitations and leg swelling.  Gastrointestinal: Negative for abdominal distention, abdominal pain, constipation and diarrhea.  Genitourinary: Negative for difficulty urinating and dysuria.  Musculoskeletal: Positive for gait problem. Negative for arthralgias, back pain, joint swelling and myalgias.  Neurological: Negative for dizziness, seizures, syncope, facial asymmetry, speech difficulty, weakness and headaches.  Hematological: Negative for adenopathy. Does not bruise/bleed easily.  Psychiatric/Behavioral: Positive for confusion. Negative for agitation and behavioral problems.    Immunization History  Administered Date(s) Administered  . Influenza Inj Mdck Quad Pf 08/09/2017  . Influenza, High Dose Seasonal PF 07/31/2016  . Influenza,inj,Quad PF,6+ Mos 08/06/2018  . Influenza-Unspecified 08/01/2010, 07/30/2012, 10/16/2012, 10/16/2013  . Pneumococcal Polysaccharide-23 02/23/2004, 02/21/2013  . Pneumococcal-Unspecified 03/17/2016  . Td 12/02/2008  . Tdap 09/01/2016  . Zoster 03/28/2006  . Zoster Recombinat (Shingrix) 10/29/2017, 01/24/2018  Pertinent  Health Maintenance Due  Topic Date Due  . INFLUENZA VACCINE  Completed  . PNA vac Low Risk Adult  Completed   Fall Risk  07/17/2018 08/21/2017 04/04/2017 02/26/2017  Falls in the past year? No No No No   Functional Status Survey:    Vitals:   08/29/18 1442  Weight: 169 lb 12.8 oz (77 kg)   Body mass index is 25.82 kg/m. Physical Exam  Constitutional: He is oriented to person, place, and time. No distress.  HENT:  Head: Normocephalic and atraumatic.  Neck: No JVD present. No tracheal deviation present. No thyromegaly present.  Cardiovascular: Normal rate and regular rhythm.  No murmur heard. Pulmonary/Chest: Effort normal and breath sounds normal. No respiratory distress. He has no wheezes.  Abdominal: Soft. Bowel sounds are normal. He  exhibits no distension. There is no tenderness.  Genitourinary:  Genitourinary Comments: Cath with yellow urine and sediment  Musculoskeletal: He exhibits no edema, tenderness or deformity.  Strength to BLE 3/5.    Lymphadenopathy:    He has no cervical adenopathy.  Neurological: He is alert and oriented to person, place, and time.  Oriented to self and place only, Able to f/c  Skin: Skin is warm and dry. He is not diaphoretic.  Psychiatric: He has a normal mood and affect.  Nursing note and vitals reviewed.   Labs reviewed: Recent Labs    11/28/17 0026 11/28/17 0513 11/29/17 0608 05/20/18  NA 138 139 143 139  K 2.8* 2.9* 3.8 4.2  CL 100* 105 110  --   CO2 24 22 24   --   GLUCOSE 226* 252* 77  --   BUN 25* 23* 19 16  CREATININE 1.07 1.04 0.85 1.0  CALCIUM 9.2 8.3* 8.3*  --   MG  --  1.4* 2.0  --    Recent Labs    11/28/17 0026  AST 25  ALT 9*  ALKPHOS 61  BILITOT 0.7  PROT 6.3*  ALBUMIN 3.1*   Recent Labs    11/28/17 0026 11/28/17 0513 11/29/17 0608 05/20/18  WBC 15.9* 15.4* 9.0 9.1  NEUTROABS 13.4*  --   --   --   HGB 11.5* 9.7* 10.5* 12.6*  HCT 34.0* 28.1* 31.8* 39*  MCV 91.9 91.5 93.0  --   PLT 215 199 195 318   Lab Results  Component Value Date   TSH 3.14 01/18/2018   Lab Results  Component Value Date   HGBA1C 6.8 (H) 09/07/2016   Lab Results  Component Value Date   CHOL 127 05/11/2017   HDL 50 05/11/2017   LDLCALC 58 05/11/2017   TRIG 98 05/11/2017    Significant Diagnostic Results in last 30 days:  No results found.  Assessment/Plan  1. Essential hypertension Controlled, continue Norvasc, HCTZ, and micardis  2. Cerebrovascular disease Has had a progressive decline in cognition and function, but over all stable in the skilled setting Continue asa and plavix as per neurology Followed by Dr Jannifer Franklin  3. Parkinsonism, unspecified Parkinsonism type ([place]) Possibly related to vascular dementia Continue sinemet   4. Benign prostatic  hyperplasia with urinary retention Has a chronic indwelling foley with cath are provided by [facility]  5. Depression, major, single episode, in partial remission (HCC) Improved Continue zoloft 50 mg qhs  Family/ staff Communication: staff  Labs/tests ordered:  NA

## 2018-09-17 DIAGNOSIS — Z79899 Other long term (current) drug therapy: Secondary | ICD-10-CM | POA: Diagnosis not present

## 2018-09-17 DIAGNOSIS — R319 Hematuria, unspecified: Secondary | ICD-10-CM | POA: Diagnosis not present

## 2018-09-17 DIAGNOSIS — N390 Urinary tract infection, site not specified: Secondary | ICD-10-CM | POA: Diagnosis not present

## 2018-09-24 ENCOUNTER — Encounter: Payer: Self-pay | Admitting: Internal Medicine

## 2018-09-24 ENCOUNTER — Non-Acute Institutional Stay (SKILLED_NURSING_FACILITY): Payer: Medicare Other | Admitting: Internal Medicine

## 2018-09-24 DIAGNOSIS — G309 Alzheimer's disease, unspecified: Secondary | ICD-10-CM

## 2018-09-24 DIAGNOSIS — G20C Parkinsonism, unspecified: Secondary | ICD-10-CM

## 2018-09-24 DIAGNOSIS — G20 Parkinson's disease: Secondary | ICD-10-CM

## 2018-09-24 DIAGNOSIS — R338 Other retention of urine: Secondary | ICD-10-CM

## 2018-09-24 DIAGNOSIS — F0280 Dementia in other diseases classified elsewhere without behavioral disturbance: Secondary | ICD-10-CM

## 2018-09-24 DIAGNOSIS — I679 Cerebrovascular disease, unspecified: Secondary | ICD-10-CM

## 2018-09-24 DIAGNOSIS — N401 Enlarged prostate with lower urinary tract symptoms: Secondary | ICD-10-CM

## 2018-09-24 DIAGNOSIS — F0150 Vascular dementia without behavioral disturbance: Secondary | ICD-10-CM

## 2018-09-24 DIAGNOSIS — I10 Essential (primary) hypertension: Secondary | ICD-10-CM

## 2018-09-24 DIAGNOSIS — F324 Major depressive disorder, single episode, in partial remission: Secondary | ICD-10-CM

## 2018-09-24 NOTE — Progress Notes (Signed)
Patient ID: Kenneth Stark, male   DOB: [birth year], 83 y.o.   MRN: [id number]  Location:  [facility] Room Number: [place] of Service:  SNF ([phone]) Provider:   [name], DO  Patient Care Team: [name], DO as PCP - General (Geriatric Medicine) Danella Sensing, MD as Consulting Physician (Dermatology) Sharyne Peach, MD as Consulting Physician (Ophthalmology) Irine Seal, MD as Attending Physician (Urology) [name], MD as Consulting Physician (Neurology)  Extended Emergency Contact Information Primary Emergency Contact: Stark,Kenneth Address: [address]          [place] [address] Kenneth Stark of [place] Phone: [phone] Relation: Spouse Secondary Emergency Contact: Kenneth Stark States of [place] Mobile Phone: [phone] Relation: Daughter  Code Status: DNR, MOST Goals of care: Advanced Directive information Advanced Directives 09/24/2018  Does Patient Have a Medical Advance Directive? Yes  Type of Paramedic of [place];Living will;Out of facility DNR (pink MOST or yellow form)  Does patient want to make changes to medical advance directive? No - Patient declined  Copy of [place] in Chart? Yes - validated most recent copy scanned in chart (See row information)  Would patient like information on creating a medical advance directive? -  Pre-existing out of facility DNR order (yellow form or pink MOST form) Yellow form placed in chart (order not valid for inpatient use)     Chief Complaint  Patient presents with  . Medical Management of Chronic Issues    Routine Visit    HPI:  Pt is a 84 y.o. male seen today for medical management of chronic diseases.  Nursing reports no new concerns.  His wife reports no new concerns.  He is sitting up, alert and speaking today.  They are about to go to lunch in the bistro which she feels he enjoys.  Staff  continue to encourage two 16 oz glasses of water in addition to his fluids with meals. This seems to help ward off his UTIs.  BP today was elevated.    Regular diet: all foods cut bite size except finger foods thin liquids.    He is completing abx for a UTI.    Past Medical History:  Diagnosis Date  . Abnormality of gait   . Arthritis   . Brain bleed ([place]) 09/01/2016  . Cervical spondylosis   . Degenerative joint disease (DJD) of lumbar spine   . Depression   . Diplopia   . Dyslipidemia   . Essential tremor   . Foul smelling urine   . Frequent falls   . GERD (gastroesophageal reflux disease)   . Glycosuria   . Hearing difficulty    hearing aids  . Hyperlipidemia   . Hypertension   . Hyperthyroidism   . Memory loss   . Osteoarthritis   . Radiculopathy, lumbar region   . 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 Small vessel disease ([place])   . TIA (transient ischemic attack)    Past Surgical History:  Procedure Laterality Date  . APPENDECTOMY     done as a child  . GALLBLADDER SURGERY  2008  . KNEE ARTHROSCOPY Left    Dr. Hart Robinsons 2002  . knee injections Right    Dr. Adriana Mccallum  . TONSILLECTOMY     done as a child    No Known Allergies  Outpatient Encounter Medications as of 09/24/2018  Medication Sig  .  albuterol (PROVENTIL) (2.5 MG/3ML) 0.083% nebulizer solution Take 3 mLs (2.5 mg total) by nebulization every 2 (two) hours as needed for wheezing or shortness of breath.  Marland Kitchen amLODipine (NORVASC) 10 MG tablet Take 1 tablet (10 mg total) by mouth daily.  Marland Kitchen ammonium lactate (AMLACTIN) 12 % cream Apply topically as needed for dry skin.  Marland Kitchen aspirin EC 81 MG tablet Take 81 mg by mouth daily.  . Calcium Carb-Cholecalciferol 600-800 MG-UNIT CHEW Chew 1 tablet by mouth 2 (two) times daily.   . carbidopa-levodopa (SINEMET IR) 25-100 MG tablet Take 1.5 tablets by mouth 3 (three) times daily.  . carboxymethylcellulose (REFRESH PLUS) 0.5 % SOLN Apply  2 drops to eye as needed (dry eyes).   . ciprofloxacin (CIPRO) 500 MG tablet Take 500 mg by mouth 2 (two) times daily.  . clopidogrel (PLAVIX) 75 MG tablet Take 75 mg by mouth daily.  Marland Kitchen Co-Enzyme Q-10 100 MG CAPS Take 100 mg by mouth daily.  . Cranberry-Vit C-Lactobacillus (RA CRANBERRY SUPPLEMENTS PO) Take 1 capsule by mouth 3 (three) times daily.   . hydrochlorothiazide (MICROZIDE) 12.5 MG capsule Take 12.5 mg by mouth daily.  . Lutein-Zeaxanthin 25-5 MG CAPS Take 1 capsule by mouth daily.  . Melatonin-Chamomile 3-500 MG-MCG TABS Take 2 tablets by mouth at bedtime as needed (sleep).  . Memantine HCl-Donepezil HCl (NAMZARIC) 28-10 MG CP24 Take 1 capsule at bedtime by mouth.   . Menthol, Topical Analgesic, (BIOFREEZE) 4 % GEL Apply 1 application topically 2 (two) times daily as needed (pain).   . Multiple Vitamin (MULTI-VITAMINS) TABS Take 1 tablet daily by mouth.   . Omega-3 Fatty Acids (OMEGA 3 PO) Take 1,000 mg 2 (two) times daily by mouth.   . pantoprazole (PROTONIX) 40 MG tablet Take 1 tablet (40 mg total) by mouth daily.  . polyethylene glycol (MIRALAX / GLYCOLAX) packet Take 17 g by mouth every other day.  . saccharomyces boulardii (FLORASTOR) 250 MG capsule Take 250 mg by mouth 2 (two) times daily.  Marland Kitchen senna-docusate (SENOKOT-S) 8.6-50 MG tablet Take 2 tablets 2 (two) times daily by mouth.   . sertraline (ZOLOFT) 50 MG tablet Take 50 mg by mouth at bedtime.  Marland Kitchen telmisartan (MICARDIS) 80 MG tablet Take 80 mg daily by mouth.  . triamcinolone (NASACORT) 55 MCG/ACT AERO nasal inhaler Place 2 sprays into the nose at bedtime.  . vitamin C (ASCORBIC ACID) 500 MG tablet Take 500 mg by mouth daily.  . [DISCONTINUED] GLUCOSAMINE-CHONDROITIN-MSM PO Take 1 tablet by mouth daily.   No facility-administered encounter medications on file as of 09/24/2018.     Review of Systems  Constitutional: Negative for activity change, appetite change, chills and fever.  HENT: Positive for trouble swallowing.  Negative for congestion.   Eyes: Negative for visual disturbance.       Glasses  Respiratory: Negative for shortness of breath.   Gastrointestinal: Positive for constipation. Negative for abdominal pain, diarrhea, nausea and vomiting.  Genitourinary: Negative for dysuria.       Catheter in place with yellow urine  Musculoskeletal: Positive for gait problem. Negative for arthralgias and back pain.       Uses power chair with red license  Skin: Negative for color change.  Neurological: Positive for weakness. Negative for dizziness.  Psychiatric/Behavioral: Positive for confusion.    Immunization History  Administered Date(s) Administered  . Influenza Inj Mdck Quad Pf 08/09/2017  . Influenza, High Dose Seasonal PF 07/31/2016  . Influenza,inj,Quad PF,6+ Mos 08/06/2018  . Influenza-Unspecified 08/01/2010, 07/30/2012,  10/16/2012, 10/16/2013  . Pneumococcal Polysaccharide-23 02/23/2004, 02/21/2013  . Pneumococcal-Unspecified 03/17/2016  . Td 12/02/2008  . Tdap 09/01/2016  . Zoster 03/28/2006  . Zoster Recombinat (Shingrix) 10/29/2017, 01/24/2018   Pertinent  Health Maintenance Due  Topic Date Due  . INFLUENZA VACCINE  Completed  . PNA vac Low Risk Adult  Completed   Fall Risk  07/17/2018 08/21/2017 04/04/2017 02/26/2017  Falls in the past year? No No No No   Functional Status Survey:    Vitals:   09/24/18 1417  BP: (!) 155/77  Pulse: 70  Resp: 17  Temp: 97.6 F (36.4 C)  TempSrc: Oral  SpO2: 95%  Weight: 171 lb (77.6 kg)  Height: 5\' 8"  (1.727 m)   Body mass index is 26 kg/m. Physical Exam Constitutional:      Appearance: Normal appearance.  HENT:     Head: Normocephalic and atraumatic.  Eyes:     Comments: glasses  Cardiovascular:     Rate and Rhythm: Normal rate and regular rhythm.     Pulses: Normal pulses.     Heart sounds: Normal heart sounds.  Pulmonary:     Effort: Pulmonary effort is normal.     Breath sounds: Normal breath sounds.  Abdominal:      General: Abdomen is flat. Bowel sounds are normal.     Palpations: Abdomen is soft.  Genitourinary:    Comments: Catheter with dark yellow urine Musculoskeletal:        General: No swelling, tenderness or deformity.  Skin:    General: Skin is warm and dry.     Capillary Refill: Capillary refill takes less than 2 seconds.  Neurological:     Mental Status: He is alert. Mental status is at baseline.  Psychiatric:        Mood and Affect: Mood normal.     Labs reviewed: Recent Labs    11/28/17 0026 11/28/17 0513 11/29/17 0608 05/20/18  NA 138 139 143 139  K 2.8* 2.9* 3.8 4.2  CL 100* 105 110  --   CO2 24 22 24   --   GLUCOSE 226* 252* 77  --   BUN 25* 23* 19 16  CREATININE 1.07 1.04 0.85 1.0  CALCIUM 9.2 8.3* 8.3*  --   MG  --  1.4* 2.0  --    Recent Labs    11/28/17 0026  AST 25  ALT 9*  ALKPHOS 61  BILITOT 0.7  PROT 6.3*  ALBUMIN 3.1*   Recent Labs    11/28/17 0026 11/28/17 0513 11/29/17 0608 05/20/18  WBC 15.9* 15.4* 9.0 9.1  NEUTROABS 13.4*  --   --   --   HGB 11.5* 9.7* 10.5* 12.6*  HCT 34.0* 28.1* 31.8* 39*  MCV 91.9 91.5 93.0  --   PLT 215 199 195 318   Lab Results  Component Value Date   TSH 3.14 01/18/2018   Lab Results  Component Value Date   HGBA1C 6.8 (H) 09/07/2016   Lab Results  Component Value Date   CHOL 127 05/11/2017   HDL 50 05/11/2017   LDLCALC 58 05/11/2017   TRIG 98 05/11/2017    Assessment/Plan 1. Parkinsonism, unspecified Parkinsonism type (HCC) -cont sinemet 1.5 tabs tid  2. Benign prostatic hyperplasia with urinary retention -cont catheter with regular changing protocol and adequate hydration with the two 16oz glasses of water in addition to his fluids with meals -finishing abx for another uti now  3. Cerebrovascular disease -ongoing, cont secondary stroke prevention  4. 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 Depression, major, single episode, in partial remission ([place]) -continues on zoloft, spirits do seem improved at this time  6. Essential hypertension -has been orthostatic historically so avoiding abrupt changes in bp regimen  Family/ staff Communication: discussed with snf nurse and pt's wife, [name]  Labs/tests ordered:  No new  Vivek Grealish L. Armaan Pond, D.O. [facility] [address], [place] [address] Cell Phone (Mon-Fri 8am-5pm):  [phone] On Call:  [phone] & follow prompts after 5pm & weekends Office Phone:  [phone] Office Fax:  [phone]

## 2018-10-18 ENCOUNTER — Non-Acute Institutional Stay (SKILLED_NURSING_FACILITY): Payer: Medicare Other | Admitting: Adult Health

## 2018-10-18 DIAGNOSIS — I10 Essential (primary) hypertension: Secondary | ICD-10-CM | POA: Diagnosis not present

## 2018-10-18 DIAGNOSIS — G20 Parkinson's disease: Secondary | ICD-10-CM | POA: Diagnosis not present

## 2018-10-18 DIAGNOSIS — Z978 Presence of other specified devices: Secondary | ICD-10-CM

## 2018-10-18 DIAGNOSIS — G309 Alzheimer's disease, unspecified: Secondary | ICD-10-CM

## 2018-10-18 DIAGNOSIS — Z960 Presence of urogenital implants: Secondary | ICD-10-CM

## 2018-10-18 DIAGNOSIS — N401 Enlarged prostate with lower urinary tract symptoms: Secondary | ICD-10-CM

## 2018-10-18 DIAGNOSIS — R338 Other retention of urine: Secondary | ICD-10-CM

## 2018-10-18 DIAGNOSIS — F0280 Dementia in other diseases classified elsewhere without behavioral disturbance: Secondary | ICD-10-CM

## 2018-10-18 DIAGNOSIS — F0150 Vascular dementia without behavioral disturbance: Secondary | ICD-10-CM

## 2018-10-21 ENCOUNTER — Encounter: Payer: Self-pay | Admitting: Adult Health

## 2018-10-21 NOTE — Progress Notes (Signed)
Location:  [facility] of Service:  SNF (31) Provider:   Cindi Carbon, ANP [place] [phone]   [name], DO  Patient Care Team: [name], DO as PCP - General (Geriatric Medicine) Danella Sensing, MD as Consulting Physician (Dermatology) Sharyne Peach, MD as Consulting Physician (Ophthalmology) Irine Seal, MD as Attending Physician (Urology) [name], MD as Consulting Physician (Neurology)  Extended Emergency Contact Information Primary Emergency Contact: Cove,[name] Address: [address]          [place] [address] [name] of [place] Phone: [phone] Relation: Spouse Secondary Emergency Contact: [name] States of [place] Mobile Phone: [phone] Relation: Daughter  Code Status:  DNR Goals of care: Advanced Directive information Advanced Directives 09/24/2018  Does Patient Have a Medical Advance Directive? Yes  Type of Paramedic of [place];Living will;Out of facility DNR (pink MOST or yellow form)  Does patient want to make changes to medical advance directive? No - Patient declined  Copy of [place] in Chart? Yes - validated most recent copy scanned in chart (See row information)  Would patient like information on creating a medical advance directive? -  Pre-existing out of facility DNR order (yellow form or pink MOST form) Yellow form placed in chart (order not valid for inpatient use)     Chief Complaint  Patient presents with  . Medical Management of Chronic Issues    HPI:  Pt is a 85 y.o. male seen today for medical management of chronic diseases.    Mixed Ad/vascular dementia: on Namzaric, MMSE 12/30 on 06/08/18.  Remains able to communicate and f/c.  Needs assistance with all ADLs  BPH: with urinary retention. He has a foley catheter and worsening hypospadias which causes bleeding at times. He has a hx of  frequent UTIs. ?if the hematuria he developed which is later diagnosed as a UTI is related to bleeding from the irritation associated with the hypospadias.   HTN: BPs range 130-160s   Functional status: lift for transfers, has catheter Past Medical History:  Diagnosis Date  . Abnormality of gait   . Arthritis   . Brain bleed ([place]) 09/01/2016  . Cervical spondylosis   . Degenerative joint disease (DJD) of lumbar spine   . Depression   . Diplopia   . Dyslipidemia   . Essential tremor   . Foul smelling urine   . Frequent falls   . GERD (gastroesophageal reflux disease)   . Glycosuria   . Hearing difficulty    hearing aids  . Hyperlipidemia   . Hypertension   . Hyperthyroidism   . Memory loss   . Osteoarthritis   . Radiculopathy, lumbar region   . 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 Small vessel disease ([place])   . TIA (transient ischemic attack)    Past Surgical History:  Procedure Laterality Date  . APPENDECTOMY     done as a child  . GALLBLADDER SURGERY  2008  . KNEE ARTHROSCOPY Left    Dr. Hart Robinsons 2002  . knee injections Right    Dr. Adriana Mccallum  . TONSILLECTOMY     done as a child    No Known Allergies  Outpatient Encounter Medications as of 10/18/2018  Medication Sig  . albuterol (PROVENTIL) (2.5 MG/3ML) 0.083% nebulizer solution Take 3 mLs (2.5 mg total) by nebulization every 2 (two) hours as needed for  wheezing or shortness of breath.  Marland Kitchen amLODipine (NORVASC) 10 MG tablet Take 1 tablet (10 mg total) by mouth daily.  Marland Kitchen ammonium lactate (AMLACTIN) 12 % cream Apply topically as needed for dry skin.  Marland Kitchen aspirin EC 81 MG tablet Take 81 mg by mouth daily.  . Calcium Carb-Cholecalciferol 600-800 MG-UNIT CHEW Chew 1 tablet by mouth 2 (two) times daily.   . carbidopa-levodopa (SINEMET IR) 25-100 MG tablet Take 1.5 tablets by mouth 3 (three) times daily.  . carboxymethylcellulose (REFRESH PLUS) 0.5 % SOLN Apply 2 drops to eye as needed  (dry eyes).   . clopidogrel (PLAVIX) 75 MG tablet Take 75 mg by mouth daily.  Marland Kitchen Co-Enzyme Q-10 100 MG CAPS Take 100 mg by mouth daily.  . Cranberry-Vit C-Lactobacillus (RA CRANBERRY SUPPLEMENTS PO) Take 1 capsule by mouth 3 (three) times daily.   . hydrochlorothiazide (MICROZIDE) 12.5 MG capsule Take 12.5 mg by mouth daily.  . Lutein-Zeaxanthin 25-5 MG CAPS Take 1 capsule by mouth daily.  . Melatonin-Chamomile 3-500 MG-MCG TABS Take 2 tablets by mouth at bedtime as needed (sleep).  . Memantine HCl-Donepezil HCl (NAMZARIC) 28-10 MG CP24 Take 1 capsule at bedtime by mouth.   . Menthol, Topical Analgesic, (BIOFREEZE) 4 % GEL Apply 1 application topically 2 (two) times daily as needed (pain).   . Multiple Vitamin (MULTI-VITAMINS) TABS Take 1 tablet daily by mouth.   . Omega-3 Fatty Acids (OMEGA 3 PO) Take 1,000 mg 2 (two) times daily by mouth.   . pantoprazole (PROTONIX) 40 MG tablet Take 1 tablet (40 mg total) by mouth daily.  . polyethylene glycol (MIRALAX / GLYCOLAX) packet Take 17 g by mouth every other day.  . senna-docusate (SENOKOT-S) 8.6-50 MG tablet Take 2 tablets 2 (two) times daily by mouth.   . sertraline (ZOLOFT) 50 MG tablet Take 50 mg by mouth at bedtime.  Marland Kitchen telmisartan (MICARDIS) 80 MG tablet Take 80 mg daily by mouth.  . triamcinolone (NASACORT) 55 MCG/ACT AERO nasal inhaler Place 2 sprays into the nose at bedtime.  . vitamin C (ASCORBIC ACID) 500 MG tablet Take 500 mg by mouth daily.   No facility-administered encounter medications on file as of 10/18/2018.     Review of Systems  Constitutional: Negative for activity change, appetite change, chills, diaphoresis, fatigue, fever and unexpected weight change.  Respiratory: Negative for cough, shortness of breath, wheezing and stridor.   Cardiovascular: Negative for chest pain, palpitations and leg swelling.  Gastrointestinal: Negative for abdominal distention, abdominal pain, constipation and diarrhea.  Genitourinary: Negative for  difficulty urinating, discharge, dysuria, hematuria and penile pain.  Musculoskeletal: Positive for gait problem. Negative for arthralgias, back pain, joint swelling and myalgias.  Neurological: Positive for weakness. Negative for dizziness, seizures, syncope, facial asymmetry, speech difficulty and headaches.       Rigidity  Hematological: Negative for adenopathy. Does not bruise/bleed easily.  Psychiatric/Behavioral: Positive for confusion. Negative for agitation and behavioral problems.    Immunization History  Administered Date(s) Administered  . Influenza Inj Mdck Quad Pf 08/09/2017  . Influenza, High Dose Seasonal PF 07/31/2016  . Influenza,inj,Quad PF,6+ Mos 08/06/2018  . Influenza-Unspecified 08/01/2010, 07/30/2012, 10/16/2012, 10/16/2013  . Pneumococcal Polysaccharide-23 02/23/2004, 02/21/2013  . Pneumococcal-Unspecified 03/17/2016  . Td 12/02/2008  . Tdap 09/01/2016  . Zoster 03/28/2006  . Zoster Recombinat (Shingrix) 10/29/2017, 01/24/2018   Pertinent  Health Maintenance Due  Topic Date Due  . INFLUENZA VACCINE  Completed  . PNA vac Low Risk Adult  Completed   Fall Risk  07/17/2018 08/21/2017 04/04/2017 02/26/2017  Falls in the past year? No No No No   Functional Status Survey:    Vitals:   10/21/18 1615  Weight: 175 lb 3.2 oz (79.5 kg)   Body mass index is 26.64 kg/m. Physical Exam Constitutional:      General: He is not in acute distress.    Appearance: He is not diaphoretic.  HENT:     Head: Normocephalic and atraumatic.  Neck:     Musculoskeletal: Normal range of motion and neck supple.     Thyroid: No thyromegaly.     Vascular: No JVD.     Trachea: No tracheal deviation.  Cardiovascular:     Rate and Rhythm: Normal rate and regular rhythm.     Heart sounds: No murmur.  Pulmonary:     Effort: Pulmonary effort is normal. No respiratory distress.     Breath sounds: Normal breath sounds. No wheezing.  Abdominal:     General: Bowel sounds are normal.  There is no distension.     Palpations: Abdomen is soft.     Tenderness: There is no abdominal tenderness.  Musculoskeletal:        General: No swelling, tenderness, deformity or signs of injury.     Right lower leg: No edema.     Left lower leg: No edema.     Comments: Decreased ROM to bilateral knees and hips  Lymphadenopathy:     Cervical: No cervical adenopathy.  Skin:    General: Skin is warm and dry.  Neurological:     General: No focal deficit present.     Mental Status: He is alert. Mental status is at baseline.     Cranial Nerves: No cranial nerve deficit.  Psychiatric:        Mood and Affect: Mood normal.     Labs reviewed: Recent Labs    11/28/17 0026 11/28/17 0513 11/29/17 0608 05/20/18  NA 138 139 143 139  K 2.8* 2.9* 3.8 4.2  CL 100* 105 110  --   CO2 24 22 24   --   GLUCOSE 226* 252* 77  --   BUN 25* 23* 19 16  CREATININE 1.07 1.04 0.85 1.0  CALCIUM 9.2 8.3* 8.3*  --   MG  --  1.4* 2.0  --    Recent Labs    11/28/17 0026  AST 25  ALT 9*  ALKPHOS 61  BILITOT 0.7  PROT 6.3*  ALBUMIN 3.1*   Recent Labs    11/28/17 0026 11/28/17 0513 11/29/17 0608 05/20/18  WBC 15.9* 15.4* 9.0 9.1  NEUTROABS 13.4*  --   --   --   HGB 11.5* 9.7* 10.5* 12.6*  HCT 34.0* 28.1* 31.8* 39*  MCV 91.9 91.5 93.0  --   PLT 215 199 195 318   Lab Results  Component Value Date   TSH 3.14 01/18/2018   Lab Results  Component Value Date   HGBA1C 6.8 (H) 09/07/2016   Lab Results  Component Value Date   CHOL 127 05/11/2017   HDL 50 05/11/2017   LDLCALC 58 05/11/2017   TRIG 98 05/11/2017    Significant Diagnostic Results in last 30 days:  No results found.  Assessment/Plan  1. Essential hypertension Some BPs slightly above goal but given hx of parkinsonism and orthostatic bp will avoid aggressive management   2. Mixed Alzheimer's and vascular dementia ([place]) Progressive decline in cognition and function but doing quite well in the skilled environment with  support from his  wife and the caregiver. Continue  Namzaric as per Dr. Jannifer Franklin  3. Parkinsonism, unspecified Parkinsonism type ([place]) Has continued rigidity to his legs. Continue exercises with bike. Continue Sinemet.  4. Benign prostatic hyperplasia with urinary retention With foley which is causing hypospadias. F/U with urology regarding possibility of s/p cath.   5. Chronic indwelling Foley catheter Care [facility], no hematuria at this time.   Family/ staff Communication:discusesd with his caregiver and nurse   Labs/tests ordered:  NA

## 2018-10-29 DIAGNOSIS — I639 Cerebral infarction, unspecified: Secondary | ICD-10-CM | POA: Diagnosis not present

## 2018-10-29 DIAGNOSIS — R339 Retention of urine, unspecified: Secondary | ICD-10-CM | POA: Diagnosis not present

## 2018-10-29 DIAGNOSIS — N200 Calculus of kidney: Secondary | ICD-10-CM | POA: Diagnosis not present

## 2018-10-29 DIAGNOSIS — G20 Parkinson's disease: Secondary | ICD-10-CM | POA: Diagnosis not present

## 2018-11-04 DIAGNOSIS — H02002 Unspecified entropion of right lower eyelid: Secondary | ICD-10-CM | POA: Diagnosis not present

## 2018-11-18 ENCOUNTER — Non-Acute Institutional Stay (SKILLED_NURSING_FACILITY): Payer: Medicare Other | Admitting: Adult Health

## 2018-11-18 DIAGNOSIS — F0150 Vascular dementia without behavioral disturbance: Secondary | ICD-10-CM

## 2018-11-18 DIAGNOSIS — N401 Enlarged prostate with lower urinary tract symptoms: Secondary | ICD-10-CM

## 2018-11-18 DIAGNOSIS — G20 Parkinson's disease: Secondary | ICD-10-CM

## 2018-11-18 DIAGNOSIS — K5901 Slow transit constipation: Secondary | ICD-10-CM | POA: Diagnosis not present

## 2018-11-18 DIAGNOSIS — F0280 Dementia in other diseases classified elsewhere without behavioral disturbance: Secondary | ICD-10-CM

## 2018-11-18 DIAGNOSIS — K219 Gastro-esophageal reflux disease without esophagitis: Secondary | ICD-10-CM

## 2018-11-18 DIAGNOSIS — G309 Alzheimer's disease, unspecified: Secondary | ICD-10-CM

## 2018-11-18 DIAGNOSIS — I10 Essential (primary) hypertension: Secondary | ICD-10-CM | POA: Diagnosis not present

## 2018-11-18 DIAGNOSIS — R338 Other retention of urine: Secondary | ICD-10-CM

## 2018-11-18 DIAGNOSIS — Z960 Presence of urogenital implants: Secondary | ICD-10-CM

## 2018-11-18 DIAGNOSIS — Z978 Presence of other specified devices: Secondary | ICD-10-CM

## 2018-11-23 ENCOUNTER — Encounter: Payer: Self-pay | Admitting: Adult Health

## 2018-11-23 NOTE — Progress Notes (Signed)
Location:  [facility] of Service:  SNF (31) Provider:   Cindi Carbon, ANP [place] [phone]   [name], DO  Patient Care Team: [name], DO as PCP - General (Geriatric Medicine) Danella Sensing, MD as Consulting Physician (Dermatology) Sharyne Peach, MD as Consulting Physician (Ophthalmology) Irine Seal, MD as Attending Physician (Urology) [name], MD as Consulting Physician (Neurology)  Extended Emergency Contact Information Primary Emergency Contact: Racca,[name] Address: [address]          [place] [address] [name] of [place] Phone: [phone] Relation: Spouse Secondary Emergency Contact: [name] States of [place] Mobile Phone: [phone] Relation: Daughter  Code Status:  DNR Goals of care: Advanced Directive information Advanced Directives 09/24/2018  Does Patient Have a Medical Advance Directive? Yes  Type of Paramedic of [place];Living will;Out of facility DNR (pink MOST or yellow form)  Does patient want to make changes to medical advance directive? No - Patient declined  Copy of [place] in Chart? Yes - validated most recent copy scanned in chart (See row information)  Would patient like information on creating a medical advance directive? -  Pre-existing out of facility DNR order (yellow form or pink MOST form) Yellow form placed in chart (order not valid for inpatient use)     Chief Complaint  Patient presents with  . Medical Management of Chronic Issues    HPI:  Pt is a 85 y.o. male seen today for medical management of chronic diseases.    Mixed Ad/vascular dementia: on Namzaric, MMSE 12/30 on 06/08/18.  Remains able to communicate and f/c.  Needs assistance with all ADLs  BPH: with urinary retention, has chronic indwelling foley   BP was consistently elevated in Jan and HCTZ was increased to 25  mg. Now bp in the 130-140 range.   GERD: denies indigestion or bad pain  Parkinsonism: has some rigidity to BUE and BLE that is responsive to sinemet. No tremor. Non ambulatory at this point so no falls or further orthostatic episodes.   Regular bowel movements reported.   Functional status: lift for transfers, has catheter Past Medical History:  Diagnosis Date  . Abnormality of gait   . Arthritis   . Brain bleed ([place]) 09/01/2016  . Cervical spondylosis   . Degenerative joint disease (DJD) of lumbar spine   . Depression   . Diplopia   . Dyslipidemia   . Essential tremor   . Foul smelling urine   . Frequent falls   . GERD (gastroesophageal reflux disease)   . Glycosuria   . Hearing difficulty    hearing aids  . Hyperlipidemia   . Hypertension   . Hyperthyroidism   . Memory loss   . Osteoarthritis   . Radiculopathy, lumbar region   . 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 Small vessel disease ([place])   . TIA (transient ischemic attack)    Past Surgical History:  Procedure Laterality Date  . APPENDECTOMY     done as a child  . GALLBLADDER SURGERY  2008  . KNEE ARTHROSCOPY Left    Dr. Hart Robinsons 2002  . knee injections Right    Dr. Adriana Mccallum  . TONSILLECTOMY     done as a child    No Known Allergies  Outpatient Encounter Medications as of 11/18/2018  Medication Sig  . albuterol (PROVENTIL) (2.5 MG/3ML)  0.083% nebulizer solution Take 3 mLs (2.5 mg total) by nebulization every 2 (two) hours as needed for wheezing or shortness of breath.  Marland Kitchen amLODipine (NORVASC) 10 MG tablet Take 1 tablet (10 mg total) by mouth daily.  Marland Kitchen ammonium lactate (AMLACTIN) 12 % cream Apply topically as needed for dry skin.  Marland Kitchen aspirin EC 81 MG tablet Take 81 mg by mouth daily.  . Calcium Carb-Cholecalciferol 600-800 MG-UNIT CHEW Chew 1 tablet by mouth 2 (two) times daily.   . carbidopa-levodopa (SINEMET IR) 25-100 MG tablet Take 1.5 tablets by mouth 3 (three) times  daily.  . carboxymethylcellulose (REFRESH PLUS) 0.5 % SOLN Apply 2 drops to eye as needed (dry eyes).   . clopidogrel (PLAVIX) 75 MG tablet Take 75 mg by mouth daily.  Marland Kitchen Co-Enzyme Q-10 100 MG CAPS Take 100 mg by mouth daily.  . Cranberry-Vit C-Lactobacillus (RA CRANBERRY SUPPLEMENTS PO) Take 1 capsule by mouth 3 (three) times daily.   . hydrochlorothiazide (MICROZIDE) 12.5 MG capsule Take 25 mg by mouth daily.   . Lutein-Zeaxanthin 25-5 MG CAPS Take 1 capsule by mouth daily.  . Memantine HCl-Donepezil HCl (NAMZARIC) 28-10 MG CP24 Take 1 capsule at bedtime by mouth.   . Menthol, Topical Analgesic, (BIOFREEZE) 4 % GEL Apply 1 application topically 2 (two) times daily as needed (pain).   . Misc Natural Products (GLUCOSAMINE CHOND COMPLEX/MSM PO) Take 1 tablet by mouth daily.  . Multiple Vitamin (MULTI-VITAMINS) TABS Take 1 tablet daily by mouth.   . Omega-3 Fatty Acids (OMEGA 3 PO) Take 1,000 mg 2 (two) times daily by mouth.   . pantoprazole (PROTONIX) 40 MG tablet Take 1 tablet (40 mg total) by mouth daily.  . polyethylene glycol (MIRALAX / GLYCOLAX) packet Take 17 g by mouth every other day.  . senna-docusate (SENOKOT-S) 8.6-50 MG tablet Take 2 tablets 2 (two) times daily by mouth.   . sertraline (ZOLOFT) 50 MG tablet Take 50 mg by mouth at bedtime.  Marland Kitchen telmisartan (MICARDIS) 80 MG tablet Take 80 mg daily by mouth.  . triamcinolone (NASACORT) 55 MCG/ACT AERO nasal inhaler Place 2 sprays into the nose at bedtime.  . vitamin C (ASCORBIC ACID) 500 MG tablet Take 500 mg by mouth daily.  . Melatonin-Chamomile 3-500 MG-MCG TABS Take 2 tablets by mouth at bedtime as needed (sleep).   No facility-administered encounter medications on file as of 11/18/2018.     Review of Systems  Constitutional: Negative for activity change, appetite change, chills, diaphoresis, fatigue, fever and unexpected weight change.  Respiratory: Negative for cough, shortness of breath, wheezing and stridor.   Cardiovascular:  Negative for chest pain, palpitations and leg swelling.  Gastrointestinal: Negative for abdominal distention, abdominal pain, constipation and diarrhea.  Genitourinary: Negative for difficulty urinating, discharge, dysuria, hematuria and penile pain.  Musculoskeletal: Positive for gait problem. Negative for arthralgias, back pain, joint swelling and myalgias.  Neurological: Positive for weakness. Negative for dizziness, seizures, syncope, facial asymmetry, speech difficulty and headaches.       Rigidity  Hematological: Negative for adenopathy. Does not bruise/bleed easily.  Psychiatric/Behavioral: Positive for confusion. Negative for agitation and behavioral problems.    Immunization History  Administered Date(s) Administered  . Influenza Inj Mdck Quad Pf 08/09/2017  . Influenza, High Dose Seasonal PF 07/31/2016  . Influenza,inj,Quad PF,6+ Mos 08/06/2018  . Influenza-Unspecified 08/01/2010, 07/30/2012, 10/16/2012, 10/16/2013  . Pneumococcal Polysaccharide-23 02/23/2004, 02/21/2013  . Pneumococcal-Unspecified 03/17/2016  . Td 12/02/2008  . Tdap 09/01/2016  . Zoster 03/28/2006  . Zoster Recombinat (  Shingrix) 10/29/2017, 01/24/2018   Pertinent  Health Maintenance Due  Topic Date Due  . INFLUENZA VACCINE  Completed  . PNA vac Low Risk Adult  Completed   Fall Risk  07/17/2018 08/21/2017 04/04/2017 02/26/2017  Falls in the past year? No No No No   Functional Status Survey:    Vitals:   11/23/18 0755  Weight: 172 lb 12.8 oz (78.4 kg)   Body mass index is 26.27 kg/m.  Wt Readings from Last 3 Encounters:  11/23/18 172 lb 12.8 oz (78.4 kg)  10/21/18 175 lb 3.2 oz (79.5 kg)  09/24/18 171 lb (77.6 kg)   Physical Exam Constitutional:      General: He is not in acute distress.    Appearance: He is not diaphoretic.  HENT:     Head: Normocephalic and atraumatic.  Neck:     Musculoskeletal: Normal range of motion and neck supple.     Thyroid: No thyromegaly.     Vascular: No JVD.      Trachea: No tracheal deviation.  Cardiovascular:     Rate and Rhythm: Normal rate and regular rhythm.     Heart sounds: No murmur.  Pulmonary:     Effort: Pulmonary effort is normal. No respiratory distress.     Breath sounds: Normal breath sounds. No wheezing.  Abdominal:     General: Bowel sounds are normal. There is no distension.     Palpations: Abdomen is soft.     Tenderness: There is no abdominal tenderness.  Musculoskeletal:        General: No swelling, tenderness, deformity or signs of injury.     Right lower leg: No edema.     Left lower leg: No edema.     Comments: Decreased ROM to bilateral knees and hips  Lymphadenopathy:     Cervical: No cervical adenopathy.  Skin:    General: Skin is warm and dry.  Neurological:     General: No focal deficit present.     Mental Status: He is alert. Mental status is at baseline.     Cranial Nerves: No cranial nerve deficit.  Psychiatric:        Mood and Affect: Mood normal.     Labs reviewed: Recent Labs    11/28/17 0026 11/28/17 0513 11/29/17 0608 05/20/18  NA 138 139 143 139  K 2.8* 2.9* 3.8 4.2  CL 100* 105 110  --   CO2 24 22 24   --   GLUCOSE 226* 252* 77  --   BUN 25* 23* 19 16  CREATININE 1.07 1.04 0.85 1.0  CALCIUM 9.2 8.3* 8.3*  --   MG  --  1.4* 2.0  --    Recent Labs    11/28/17 0026  AST 25  ALT 9*  ALKPHOS 61  BILITOT 0.7  PROT 6.3*  ALBUMIN 3.1*   Recent Labs    11/28/17 0026 11/28/17 0513 11/29/17 0608 05/20/18  WBC 15.9* 15.4* 9.0 9.1  NEUTROABS 13.4*  --   --   --   HGB 11.5* 9.7* 10.5* 12.6*  HCT 34.0* 28.1* 31.8* 39*  MCV 91.9 91.5 93.0  --   PLT 215 199 195 318   Lab Results  Component Value Date   TSH 3.14 01/18/2018   Lab Results  Component Value Date   HGBA1C 6.8 (H) 09/07/2016   Lab Results  Component Value Date   CHOL 127 05/11/2017   HDL 50 05/11/2017   LDLCALC 58 05/11/2017   TRIG 98 05/11/2017  Significant Diagnostic Results in last 30 days:  No results  found.  Assessment/Plan  1. Essential hypertension Improved Continue HCTZ, telmisartan, and norvasc Goal BP <150/90  2. Mixed Alzheimer's and vascular dementia ([place]) Doing well in the skilled care setting but requires assistance with all ADLs due to advanced symptoms  3. Gastroesophageal reflux disease without esophagitis Controlled with protonix 40 mg qd  4. Parkinsonism, unspecified Parkinsonism type ([place]) Followed by Dr Jannifer Franklin, continue sinemet for symptoms of rigidity  5. Slow transit constipation Controlled with senokot and miralax  6. Chronic indwelling Foley catheter Due to BPH, urology has considered suprapubic due to foley cath induced hypospadias. Needs to wear stat lock on leg at all times  7. Benign prostatic hyperplasia with urinary retention Foley in place with clear urine and no symptoms   Family/ staff Communication:discussed with his caregiver and nurse   Labs/tests ordered:  NA

## 2018-12-12 DIAGNOSIS — D649 Anemia, unspecified: Secondary | ICD-10-CM | POA: Diagnosis not present

## 2018-12-12 DIAGNOSIS — I10 Essential (primary) hypertension: Secondary | ICD-10-CM | POA: Diagnosis not present

## 2018-12-12 DIAGNOSIS — Z79899 Other long term (current) drug therapy: Secondary | ICD-10-CM | POA: Diagnosis not present

## 2018-12-12 LAB — BASIC METABOLIC PANEL
BUN: 16 (ref 4–21)
Creatinine: 0.8 (ref 0.6–1.3)
Glucose: 206
Potassium: 4 (ref 3.4–5.3)
Sodium: 138 (ref 137–147)

## 2018-12-24 ENCOUNTER — Encounter: Payer: Self-pay | Admitting: Internal Medicine

## 2018-12-24 ENCOUNTER — Non-Acute Institutional Stay (SKILLED_NURSING_FACILITY): Payer: Medicare Other | Admitting: Internal Medicine

## 2018-12-24 DIAGNOSIS — Z960 Presence of urogenital implants: Secondary | ICD-10-CM | POA: Diagnosis not present

## 2018-12-24 DIAGNOSIS — K5901 Slow transit constipation: Secondary | ICD-10-CM

## 2018-12-24 DIAGNOSIS — G309 Alzheimer's disease, unspecified: Secondary | ICD-10-CM | POA: Diagnosis not present

## 2018-12-24 DIAGNOSIS — N401 Enlarged prostate with lower urinary tract symptoms: Secondary | ICD-10-CM

## 2018-12-24 DIAGNOSIS — G20C Parkinsonism, unspecified: Secondary | ICD-10-CM

## 2018-12-24 DIAGNOSIS — F0150 Vascular dementia without behavioral disturbance: Secondary | ICD-10-CM

## 2018-12-24 DIAGNOSIS — R269 Unspecified abnormalities of gait and mobility: Secondary | ICD-10-CM

## 2018-12-24 DIAGNOSIS — F0280 Dementia in other diseases classified elsewhere without behavioral disturbance: Secondary | ICD-10-CM

## 2018-12-24 DIAGNOSIS — G20 Parkinson's disease: Secondary | ICD-10-CM

## 2018-12-24 DIAGNOSIS — Z978 Presence of other specified devices: Secondary | ICD-10-CM

## 2018-12-24 DIAGNOSIS — R338 Other retention of urine: Secondary | ICD-10-CM

## 2018-12-24 NOTE — Progress Notes (Signed)
Patient ID: Kenneth Stark, male   DOB: Apr 16, 1925, 85 y.o.   MRN: [id number]  Location:  [facility] Room Number: [place] of Service:  SNF [phone]) Provider:   [name], DO  Patient Care Team: [name], DO as PCP - General (Geriatric Medicine) Danella Sensing, MD as Consulting Physician (Dermatology) Sharyne Peach, MD as Consulting Physician (Ophthalmology) Irine Seal, MD as Attending Physician (Urology) [name], MD as Consulting Physician (Neurology)  Extended Emergency Contact Information Primary Emergency Contact: Harbold,[name] Address: [address]          [place] [address] [name] of [place] Phone: [phone] Relation: Spouse Secondary Emergency Contact: [name] States of [place] Mobile Phone: [phone] Relation: Daughter  Code Status:  DNR Goals of care: Advanced Directive information Advanced Directives 12/24/2018  Does Patient Have a Medical Advance Directive? Yes  Type of Paramedic of [place];Living will;Out of facility DNR (pink MOST or yellow form)  Does patient want to make changes to medical advance directive? No - Patient declined  Copy of [place] in Chart? Yes - validated most recent copy scanned in chart (See row information)  Would patient like information on creating a medical advance directive? -  Pre-existing out of facility DNR order (yellow form or pink MOST form) Yellow form placed in chart (order not valid for inpatient use)     Chief Complaint  Patient presents with  . Medical Management of Chronic Issues    Routine Visit    HPI:  Pt is a 85 y.o. male seen today for medical management of chronic diseases.  He lives in SNF for long-term care since his stroke.  He has mixed AD/vascular dementia:  On namzaric, last MMSE 17/30 this past month.  Does speak some but needs full adls assistance.  He has a Engineer, drilling for  transfers.  He has chronic urinary retention with a chronic foley that is changed by nurses regularly.  If fluids are pushed with two large glasses of water during the afternoons, pt tends to avoid UTIs better, but does have a history of these recurrently.  Last  Change 2/26.  Parkinsonism persists with rigidity of his extremities that his wife noted improved with sinemet and worsened w/o.  He no longer has dizzy spells.    Recently on 2/20, he had a dental extraction on his right posterior that was followed by a course of 10 days of amoxicillin.  He completed the course w/o complications and just one day of low grade temp.  He was treated the first day with tylenol and ibuprofen for pain.    BPs under better control with increased hctz--monitor for dehydration--this should be held if his intake is poor. Fluids must be pushed regularly to prevent infections.  There have been no recent difficulties with constipation on current regimen.  Weight appears stable at 175 (oscillates in lower 170s). Past Medical History:  Diagnosis Date  . Abnormality of gait   . Arthritis   . Brain bleed ([place]) 09/01/2016  . Cervical spondylosis   . Degenerative joint disease (DJD) of lumbar spine   . Depression   . Diplopia   . Dyslipidemia   . Essential tremor   . Foul smelling urine   . Frequent falls   . GERD (gastroesophageal reflux disease)   . Glycosuria   . Hearing difficulty    hearing aids  . Hyperlipidemia   . Hypertension   .  Hyperthyroidism   . Memory loss   . Osteoarthritis   . Radiculopathy, lumbar region   . 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 Small vessel disease ([place])   . TIA (transient ischemic attack)    Past Surgical History:  Procedure Laterality Date  . APPENDECTOMY     done as a child  . GALLBLADDER SURGERY  2008  . KNEE ARTHROSCOPY Left    Dr. Hart Robinsons 2002  . knee injections Right    Dr. Adriana Mccallum  . TONSILLECTOMY     done as a  child    No Known Allergies  Outpatient Encounter Medications as of 12/24/2018  Medication Sig  . amLODipine (NORVASC) 10 MG tablet Take 1 tablet (10 mg total) by mouth daily.  Marland Kitchen ammonium lactate (AMLACTIN) 12 % cream Apply topically as needed for dry skin.  Marland Kitchen aspirin EC 81 MG tablet Take 81 mg by mouth daily.  . Calcium Carb-Cholecalciferol 600-800 MG-UNIT CHEW Chew 1 tablet by mouth 2 (two) times daily.   . carbidopa-levodopa (SINEMET IR) 25-100 MG tablet Take 1.5 tablets by mouth 3 (three) times daily.  . carboxymethylcellulose (REFRESH PLUS) 0.5 % SOLN Apply 2 drops to eye as needed (dry eyes).   . clopidogrel (PLAVIX) 75 MG tablet Take 75 mg by mouth daily.  Marland Kitchen Co-Enzyme Q-10 100 MG CAPS Take 100 mg by mouth daily.  . Cranberry-Vit C-Lactobacillus (RA CRANBERRY SUPPLEMENTS PO) Take 1 capsule by mouth 3 (three) times daily.   . hydrochlorothiazide (MICROZIDE) 12.5 MG capsule Take 25 mg by mouth daily.   . Lutein-Zeaxanthin 25-5 MG CAPS Take 1 capsule by mouth daily.  . Memantine HCl-Donepezil HCl (NAMZARIC) 28-10 MG CP24 Take 1 capsule at bedtime by mouth.   . Menthol, Topical Analgesic, (BIOFREEZE) 4 % GEL Apply 1 application topically 2 (two) times daily as needed (pain).   . Misc Natural Products (GLUCOSAMINE CHOND COMPLEX/MSM PO) Take 1 tablet by mouth daily.  . Multiple Vitamin (MULTI-VITAMINS) TABS Take 1 tablet daily by mouth.   . Omega-3 Fatty Acids (OMEGA 3 PO) Take 1,000 mg 2 (two) times daily by mouth.   . polyethylene glycol (MIRALAX / GLYCOLAX) packet Take 17 g by mouth every other day.  . senna-docusate (SENOKOT-S) 8.6-50 MG tablet Take 2 tablets 2 (two) times daily by mouth.   . sertraline (ZOLOFT) 50 MG tablet Take 50 mg by mouth at bedtime.  Marland Kitchen telmisartan (MICARDIS) 80 MG tablet Take 80 mg daily by mouth.  . triamcinolone (NASACORT) 55 MCG/ACT AERO nasal inhaler Place 2 sprays into the nose at bedtime.  . vitamin C (ASCORBIC ACID) 500 MG tablet Take 500 mg by mouth  daily.  . pantoprazole (PROTONIX) 40 MG tablet Take 1 tablet (40 mg total) by mouth daily.  . [DISCONTINUED] albuterol (PROVENTIL) (2.5 MG/3ML) 0.083% nebulizer solution Take 3 mLs (2.5 mg total) by nebulization every 2 (two) hours as needed for wheezing or shortness of breath.  . [DISCONTINUED] Melatonin-Chamomile 3-500 MG-MCG TABS Take 2 tablets by mouth at bedtime as needed (sleep).   No facility-administered encounter medications on file as of 12/24/2018.     Review of Systems  Reason unable to perform ROS: obtained from pt and nursing.  Constitutional: Positive for fatigue. Negative for activity change, appetite change, chills, fever and unexpected weight change.  HENT: Positive for dental problem and hearing loss.   Eyes: Negative for visual disturbance.       Glasses  Respiratory: Negative for cough and shortness of breath.   Cardiovascular: Negative for chest pain, palpitations and leg swelling.  Gastrointestinal: Negative for abdominal pain, blood in stool, constipation, diarrhea and nausea.  Endocrine: Negative for polydipsia.  Genitourinary:       Indwelling foley with dark yellow urine  Musculoskeletal: Positive for gait problem.  Skin: Negative for color change.  Neurological: Negative for dizziness and tremors.       Chronic rigidity of extremities  Hematological: Bruises/bleeds easily.  Psychiatric/Behavioral: Positive for confusion. Negative for agitation, behavioral problems, hallucinations and sleep disturbance.    Immunization History  Administered Date(s) Administered  . Influenza Inj Mdck Quad Pf 08/09/2017  . Influenza, High Dose Seasonal PF 07/31/2016  . Influenza,inj,Quad PF,6+ Mos 08/06/2018  . Influenza-Unspecified 08/01/2010, 07/30/2012, 10/16/2012, 10/16/2013  . Pneumococcal Polysaccharide-23 02/23/2004, 02/21/2013  . Pneumococcal-Unspecified 03/17/2016  . Td 12/02/2008  . Tdap 09/01/2016  . Zoster 03/28/2006  . Zoster Recombinat (Shingrix)  10/29/2017, 01/24/2018   Pertinent  Health Maintenance Due  Topic Date Due  . INFLUENZA VACCINE  Completed  . PNA vac Low Risk Adult  Completed   Fall Risk  07/17/2018 08/21/2017 04/04/2017 02/26/2017  Falls in the past year? No No No No   Functional Status Survey:    Vitals:   12/24/18 1301  BP: (!) 147/72  Pulse: 62  Resp: 18  Temp: 98 F (36.7 C)  TempSrc: Oral  SpO2: 96%  Weight: 175 lb (79.4 kg)  Height: 5\' 8"  (1.727 m)   Body mass index is 26.61 kg/m. Physical Exam Vitals signs and nursing note reviewed.  Constitutional:      General: He is not in acute distress.    Appearance: Normal appearance. He is normal weight. He is not toxic-appearing.  HENT:     Head: Normocephalic and atraumatic.     Ears:     Comments: Hearing aids Eyes:     Comments: glasses  Cardiovascular:     Rate and Rhythm: Normal rate and regular rhythm.     Pulses: Normal pulses.     Heart sounds: Normal heart sounds.  Pulmonary:     Effort: Pulmonary effort is normal.     Breath sounds: Normal breath sounds.  Abdominal:     General: Bowel sounds are normal.     Palpations: Abdomen is soft.  Genitourinary:    Comments: Foley in place with dark yellow urine Musculoskeletal: Normal range of motion.     Right lower leg: No edema.     Left lower leg: No edema.     Comments: Limited a bit by rigidity  Skin:    General: Skin is warm and dry.     Capillary Refill: Capillary refill takes less than 2 seconds.  Neurological:     Mental Status: He is alert. Mental status is at baseline.     Comments: Rigidity of extremities; seated in high-backed wheelchair, alert and responding to questions   Psychiatric:     Comments: Masked facies     Labs reviewed: Recent Labs    05/20/18 12/12/18 0600  NA 139 138  K 4.2 4.0  BUN 16 16  CREATININE 1.0 0.8   No results for input(s): AST, ALT, ALKPHOS, BILITOT, PROT, ALBUMIN in the last 8760 hours. Recent Labs    05/20/18  WBC 9.1  HGB 12.6*    HCT 39*  PLT 318   Lab Results  Component Value Date   TSH 3.14 01/18/2018   Lab Results  Component Value  Date   HGBA1C 6.8 (H) 09/07/2016   Lab Results  Component Value Date   CHOL 127 05/11/2017   HDL 50 05/11/2017   LDLCALC 58 05/11/2017   TRIG 98 05/11/2017    Significant Diagnostic Results in last 30 days:  No results found.  Assessment/Plan 1. Mixed Alzheimer's and vascular dementia ([place]) -mmse score actually 5 pts better this year vs last at 17/30 -remains on namzaric -cont snf support  2. Parkinsonism, unspecified Parkinsonism type ([place]) -seems vascular as no tremor and more lower extremities and autonomic insuffiency -cont sinemet  3. Chronic indwelling Foley catheter -ongoing, nursing changes regularly and last change 2/26  4. Gait disorder -no change, uses manual wheelchair and hoyer lift  5. Slow transit constipation -bowels moving daily with current regimen, cont same and monitor  6. Benign prostatic hyperplasia with urinary retention -ongoing, continue foley, pushing fluids ongoing especially with diuretic increase   Family/ staff Communication: discussed with SNF nurse  Labs/tests ordered:  No new  Paul Trettin L. Jozelynn Danielson, D.O. [facility] [address], [place] [address] Cell Phone (Mon-Fri 8am-5pm):  [phone] On Call:  [phone] & follow prompts after 5pm & weekends Office Phone:  [phone] Office Fax:  [phone]

## 2018-12-30 ENCOUNTER — Encounter: Payer: Self-pay | Admitting: Neurology

## 2018-12-30 ENCOUNTER — Telehealth: Payer: Self-pay | Admitting: Neurology

## 2018-12-30 ENCOUNTER — Encounter: Payer: Self-pay | Admitting: *Deleted

## 2018-12-30 NOTE — Telephone Encounter (Signed)
Patients wife called in and stated she would need a letter stating he has been in and out of rehab , assisted livings and skilled nursing since his stroke in 2017 and very limited to things he can do as far as taking care of any business matters  CB# [phone]

## 2018-12-30 NOTE — Telephone Encounter (Signed)
I called wife back. I read letter to her from Dr. Jannifer Franklin per request. She wants the following added to letter: how long he has been seeing Dr. Jannifer Franklin, when he was last seen and how often he is to follow up.  I addended letter, printed, waiting on MD signature.

## 2018-12-30 NOTE — Telephone Encounter (Signed)
The letter will be dictated.  The patient has Parkinson's disease, advanced age, nonambulatory state, and dementia.

## 2018-12-31 NOTE — Telephone Encounter (Signed)
Completed/signed letter placed in out going mail at [facility].

## 2019-01-03 DIAGNOSIS — R319 Hematuria, unspecified: Secondary | ICD-10-CM | POA: Diagnosis not present

## 2019-01-03 DIAGNOSIS — N390 Urinary tract infection, site not specified: Secondary | ICD-10-CM | POA: Diagnosis not present

## 2019-01-08 ENCOUNTER — Encounter: Payer: Self-pay | Admitting: Internal Medicine

## 2019-01-23 ENCOUNTER — Non-Acute Institutional Stay (SKILLED_NURSING_FACILITY): Payer: Medicare Other | Admitting: Adult Health

## 2019-01-23 DIAGNOSIS — Z978 Presence of other specified devices: Secondary | ICD-10-CM

## 2019-01-23 DIAGNOSIS — F0150 Vascular dementia without behavioral disturbance: Secondary | ICD-10-CM

## 2019-01-23 DIAGNOSIS — G309 Alzheimer's disease, unspecified: Secondary | ICD-10-CM | POA: Diagnosis not present

## 2019-01-23 DIAGNOSIS — H903 Sensorineural hearing loss, bilateral: Secondary | ICD-10-CM

## 2019-01-23 DIAGNOSIS — G20 Parkinson's disease: Secondary | ICD-10-CM

## 2019-01-23 DIAGNOSIS — K5901 Slow transit constipation: Secondary | ICD-10-CM

## 2019-01-23 DIAGNOSIS — G20C Parkinsonism, unspecified: Secondary | ICD-10-CM

## 2019-01-23 DIAGNOSIS — Z960 Presence of urogenital implants: Secondary | ICD-10-CM

## 2019-01-23 DIAGNOSIS — N401 Enlarged prostate with lower urinary tract symptoms: Secondary | ICD-10-CM

## 2019-01-23 DIAGNOSIS — F0280 Dementia in other diseases classified elsewhere without behavioral disturbance: Secondary | ICD-10-CM

## 2019-01-23 DIAGNOSIS — R338 Other retention of urine: Secondary | ICD-10-CM

## 2019-01-23 DIAGNOSIS — I10 Essential (primary) hypertension: Secondary | ICD-10-CM

## 2019-01-25 ENCOUNTER — Other Ambulatory Visit: Payer: Self-pay

## 2019-01-25 ENCOUNTER — Encounter: Payer: Self-pay | Admitting: Adult Health

## 2019-01-25 NOTE — Progress Notes (Signed)
Location:  [facility]up[facility] psychologist of Service:  SNF (31) Provider:   Cindi Carbon, ANP [place] [phone]  [name], DO  Patient Care Team: [name], DO as PCP - General (Geriatric Medicine) Danella Sensing, MD as Consulting Physician (Dermatology) Sharyne Peach, MD as Consulting Physician (Ophthalmology) Irine Seal, MD as Attending Physician (Urology) [name], MD as Consulting Physician (Neurology)  Extended Emergency Contact Information Primary Emergency Contact: Shannon,[name] Address: [address]          [place] [address] [name] of [place] Phone: [phone] Relation: Spouse Secondary Emergency Contact: [name] States of [place] Mobile Phone: [phone] Relation: Daughter  Code Status:  DNR Goals of care: Advanced Directive information Advanced Directives 12/24/2018  Does Patient Have a Medical Advance Directive? Yes  Type of Paramedic of [place];Living will;Out of facility DNR (pink MOST or yellow form)  Does patient want to make changes to medical advance directive? No - Patient declined  Copy of [place] in Chart? Yes - validated most recent copy scanned in chart (See row information)  Would patient like information on creating a medical advance directive? -  Pre-existing out of facility DNR order (yellow form or pink MOST form) Yellow form placed in chart (order not valid for inpatient use)     Chief Complaint  Patient presents with  . Medical Management of Chronic Issues    HPI:  Pt is a 85 y.o. male seen today for medical management of chronic diseases.   Staff report decreased LOC on 4/6 which resolved spontaneously. No focal deficit or change in VS. He continues to have increased periods of lethargy and disorientation associated with his diagnosis of dementia. Foley cath in place due BPH with urinary retention.  Draining well with no reports in dysuria, pain, fever, decreased output, or hematuria. Normal bowels and appetite reported. Parkinsonism with rigidity to BUE and BLE controlled with sinemet. No reports of increased rigidity or tremor.  BP controlled.  Functional status: lift for transfers, incontinent with foley cath Past Medical History:  Diagnosis Date  . Abnormality of gait   . Arthritis   . Brain bleed ([place]) 09/01/2016  . Cervical spondylosis   . Degenerative joint disease (DJD) of lumbar spine   . Depression   . Diplopia   . Dyslipidemia   . Essential tremor   . Foul smelling urine   . Frequent falls   . GERD (gastroesophageal reflux disease)   . Glycosuria   . Hearing difficulty    hearing aids  . Hyperlipidemia   . Hypertension   . Hyperthyroidism   . Memory loss   . Osteoarthritis   . Radiculopathy, lumbar region   . 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 Small vessel disease ([place])   . TIA (transient ischemic attack)    Past Surgical History:  Procedure Laterality Date  . APPENDECTOMY     done as a child  . GALLBLADDER SURGERY  2008  . KNEE ARTHROSCOPY Left    Dr. Hart Robinsons 2002  . knee injections Right    Dr. Adriana Mccallum  . TONSILLECTOMY     done as a child    No Known Allergies  Outpatient Encounter Medications as of 01/23/2019  Medication Sig  . amLODipine (NORVASC) 10 MG tablet Take 1 tablet (10 mg total) by mouth daily.  Marland Kitchen ammonium lactate (AMLACTIN) 12 %  cream Apply 1 Bottle topically at bedtime.   Marland Kitchen aspirin EC 81 MG tablet Take 81 mg by mouth daily.  . Calcium Carb-Cholecalciferol 600-800 MG-UNIT CHEW Chew 1 tablet by mouth 2 (two) times daily.   . carbidopa-levodopa (SINEMET IR) 25-100 MG tablet Take 1.5 tablets by mouth 3 (three) times daily.  . carboxymethylcellulose (REFRESH PLUS) 0.5 % SOLN Apply 2 drops to eye as needed (dry eyes).   . Cranberry 400 MG CAPS Take 1 capsule by mouth 3 (three) times daily.  .  hydrochlorothiazide (MICROZIDE) 12.5 MG capsule Take 25 mg by mouth daily.   . Lutein-Zeaxanthin 25-5 MG CAPS Take 1 capsule by mouth daily.  . Memantine HCl-Donepezil HCl (NAMZARIC) 28-10 MG CP24 Take 1 capsule at bedtime by mouth.   . Menthol, Topical Analgesic, (BIOFREEZE) 4 % GEL Apply 1 application topically 2 (two) times daily as needed (pain).   . Misc Natural Products (GLUCOSAMINE CHOND COMPLEX/MSM PO) Take 1 tablet by mouth daily.  . Multiple Vitamin (MULTI-VITAMINS) TABS Take 1 tablet daily by mouth.   . Omega-3 Fatty Acids (OMEGA 3 PO) Take 1,000 mg 2 (two) times daily by mouth.   . polyethylene glycol (MIRALAX / GLYCOLAX) packet Take 17 g by mouth every other day.  . senna-docusate (SENOKOT-S) 8.6-50 MG tablet Take 2 tablets 2 (two) times daily by mouth.   . sertraline (ZOLOFT) 50 MG tablet Take 50 mg by mouth at bedtime.  Marland Kitchen telmisartan (MICARDIS) 80 MG tablet Take 80 mg daily by mouth.  . triamcinolone (NASACORT) 55 MCG/ACT AERO nasal inhaler Place 2 sprays into the nose at bedtime.  . vitamin C (ASCORBIC ACID) 500 MG tablet Take 500 mg by mouth daily.  . clopidogrel (PLAVIX) 75 MG tablet Take 75 mg by mouth daily.  Marland Kitchen Co-Enzyme Q-10 100 MG CAPS Take 100 mg by mouth daily.  . Cranberry-Vit C-Lactobacillus (RA CRANBERRY SUPPLEMENTS PO) Take 1 capsule by mouth 3 (three) times daily.   . pantoprazole (PROTONIX) 40 MG tablet Take 1 tablet (40 mg total) by mouth daily.   No facility-administered encounter medications on file as of 01/23/2019.     Review of Systems  Unable to perform ROS: Dementia    Immunization History  Administered Date(s) Administered  . Influenza Inj Mdck Quad Pf 08/09/2017  . Influenza, High Dose Seasonal PF 07/31/2016  . Influenza,inj,Quad PF,6+ Mos 08/06/2018  . Influenza-Unspecified 08/01/2010, 07/30/2012, 10/16/2012, 10/16/2013  . Pneumococcal Polysaccharide-23 02/23/2004, 02/21/2013  . Pneumococcal-Unspecified 03/17/2016  . Td 12/02/2008  . Tdap  09/01/2016  . Zoster 03/28/2006  . Zoster Recombinat (Shingrix) 10/29/2017, 01/24/2018   Pertinent  Health Maintenance Due  Topic Date Due  . INFLUENZA VACCINE  05/17/2019  . PNA vac Low Risk Adult  Completed   Fall Risk  01/04/2019 07/17/2018 08/21/2017 04/04/2017 02/26/2017  Falls in the past year? Exclusion - non ambulatory No No No No   Functional Status Survey:    Vitals:   01/25/19 0828  Weight: 174 lb 12.8 oz (79.3 kg)   Body mass index is 26.58 kg/m. Physical Exam Vitals signs and nursing note reviewed.  Constitutional:      General: He is not in acute distress.    Appearance: He is not diaphoretic.  HENT:     Head: Normocephalic and atraumatic.  Neck:     Thyroid: No thyromegaly.     Vascular: No JVD.     Trachea: No tracheal deviation.  Cardiovascular:     Rate and Rhythm: Normal rate and regular rhythm.  Heart sounds: No murmur.  Pulmonary:     Effort: Pulmonary effort is normal. No respiratory distress.     Breath sounds: Normal breath sounds. No wheezing.  Abdominal:     General: Bowel sounds are normal. There is no distension.     Palpations: Abdomen is soft.     Tenderness: There is no abdominal tenderness.  Musculoskeletal:     Right lower leg: No edema.     Left lower leg: No edema.  Lymphadenopathy:     Cervical: No cervical adenopathy.  Skin:    General: Skin is warm and dry.  Neurological:     General: No focal deficit present.     Mental Status: He is alert. Mental status is at baseline.     Cranial Nerves: No cranial nerve deficit.     Comments: Oriented to self. MAE  Psychiatric:        Mood and Affect: Mood normal.     Labs reviewed: Recent Labs    05/20/18 12/12/18 0600  NA 139 138  K 4.2 4.0  BUN 16 16  CREATININE 1.0 0.8   No results for input(s): AST, ALT, ALKPHOS, BILITOT, PROT, ALBUMIN in the last 8760 hours. Recent Labs    05/20/18  WBC 9.1  HGB 12.6*  HCT 39*  PLT 318   Lab Results  Component Value Date   TSH  3.14 01/18/2018   Lab Results  Component Value Date   HGBA1C 6.8 (H) 09/07/2016   Lab Results  Component Value Date   CHOL 127 05/11/2017   HDL 50 05/11/2017   LDLCALC 58 05/11/2017   TRIG 98 05/11/2017    Significant Diagnostic Results in last 30 days:  No results found.  Assessment/Plan 1. Mixed Alzheimer's and vascular dementia ([place]) Progressive decline in cognition and orientation c/w the disease.  Continue Namzaric.   2. Essential hypertension Controlled. Continue HCTZ, Norvasc, and Micardis.  3. Parkinsonism, unspecified Parkinsonism type ([place]) Followed by Dr Jannifer Franklin.  Continue Sinemet 1.5 tabs tid  4. Sensorineural hearing loss (SNHL), bilateral Wears hearing aides.   5. Benign prostatic hyperplasia with urinary retention With foley cath Followed by urology. Cath induce hypospadias noted.  6. Chronic indwelling Foley catheter Draining well, staff to monitor and provide cath care/maintenance  7. Slow transit constipation Controlled with senokot s and miralax    Family/ staff Communication: staff/resident  Labs/tests ordered:  NA

## 2019-02-06 ENCOUNTER — Non-Acute Institutional Stay (SKILLED_NURSING_FACILITY): Payer: Medicare Other | Admitting: Adult Health

## 2019-02-06 DIAGNOSIS — K1379 Other lesions of oral mucosa: Secondary | ICD-10-CM | POA: Diagnosis not present

## 2019-02-07 ENCOUNTER — Encounter: Payer: Self-pay | Admitting: Adult Health

## 2019-02-07 NOTE — Progress Notes (Signed)
Location:  Occupational psychologist of Service:  SNF (31) Provider:   Cindi Carbon, ANP [place] [phone]  [name], DO  Patient Care Team: [name], DO as PCP - General (Geriatric Medicine) Danella Sensing, MD as Consulting Physician (Dermatology) Sharyne Peach, MD as Consulting Physician (Ophthalmology) Irine Seal, MD as Attending Physician (Urology) [name], MD as Consulting Physician (Neurology)  Extended Emergency Contact Information Primary Emergency Contact: Kimbell,[name] Address: [address]          [place] [address] [name] of [place] Phone: [phone] Relation: Spouse Secondary Emergency Contact: [name] States of [place] Mobile Phone: [phone] Relation: Daughter  Code Status:  DNR Goals of care: Advanced Directive information Advanced Directives 12/24/2018  Does Patient Have a Medical Advance Directive? Yes  Type of Paramedic of [place];Living will;Out of facility DNR (pink MOST or yellow form)  Does patient want to make changes to medical advance directive? No - Patient declined  Copy of [place] in Chart? Yes - validated most recent copy scanned in chart (See row information)  Would patient like information on creating a medical advance directive? -  Pre-existing out of facility DNR order (yellow form or pink MOST form) Yellow form placed in chart (order not valid for inpatient use)     Chief Complaint  Patient presents with  . Acute Visit    mouth pain    HPI:  Pt is a 85 y.o. male seen today for mouth/jaw pain. Present intermittently per nurse for 2-3 days. No fever, purulent drainage, difficulty eating etc. He has a hx of advanced dementia and can not contribute to the history. He was noted by the nurse to have some discomfort to the right jaw area. He has a remote history of dental infection with tooth  extraction on the right in Feb of 2020.   Past Medical History:  Diagnosis Date  . Abnormality of gait   . Arthritis   . Brain bleed ([place]) 09/01/2016  . Cervical spondylosis   . Degenerative joint disease (DJD) of lumbar spine   . Depression   . Diplopia   . Dyslipidemia   . Essential tremor   . Foul smelling urine   . Frequent falls   . GERD (gastroesophageal reflux disease)   . Glycosuria   . Hearing difficulty    hearing aids  . Hyperlipidemia   . Hypertension   . Hyperthyroidism   . Memory loss   . Osteoarthritis   . Radiculopathy, lumbar region   . 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 Small vessel disease ([place])   . TIA (transient ischemic attack)    Past Surgical History:  Procedure Laterality Date  . APPENDECTOMY     done as a child  . GALLBLADDER SURGERY  2008  . KNEE ARTHROSCOPY Left    Dr. Hart Robinsons 2002  . knee injections Right    Dr. Adriana Mccallum  . TONSILLECTOMY     done as a child    No Known Allergies  Outpatient Encounter Medications as of 02/06/2019  Medication Sig  . amLODipine (NORVASC) 10 MG tablet Take 1 tablet (10 mg total) by mouth daily.  Marland Kitchen ammonium lactate (AMLACTIN) 12 % cream Apply 1 Bottle topically at bedtime.   Marland Kitchen aspirin EC 81 MG tablet Take 81 mg by mouth daily.  . Calcium Carb-Cholecalciferol 600-800 MG-UNIT CHEW  Chew 1 tablet by mouth 2 (two) times daily.   . carbidopa-levodopa (SINEMET IR) 25-100 MG tablet Take 1.5 tablets by mouth 3 (three) times daily.  . carboxymethylcellulose (REFRESH PLUS) 0.5 % SOLN Apply 2 drops to eye as needed (dry eyes).   . clopidogrel (PLAVIX) 75 MG tablet Take 75 mg by mouth daily.  Marland Kitchen Co-Enzyme Q-10 100 MG CAPS Take 100 mg by mouth daily.  . Cranberry 400 MG CAPS Take 1 capsule by mouth 3 (three) times daily.  . Cranberry-Vit C-Lactobacillus (RA CRANBERRY SUPPLEMENTS PO) Take 1 capsule by mouth 3 (three) times daily.   . hydrochlorothiazide (MICROZIDE) 12.5 MG capsule  Take 25 mg by mouth daily.   . Lutein-Zeaxanthin 25-5 MG CAPS Take 1 capsule by mouth daily.  . Memantine HCl-Donepezil HCl (NAMZARIC) 28-10 MG CP24 Take 1 capsule at bedtime by mouth.   . Menthol, Topical Analgesic, (BIOFREEZE) 4 % GEL Apply 1 application topically 2 (two) times daily as needed (pain).   . Misc Natural Products (GLUCOSAMINE CHOND COMPLEX/MSM PO) Take 1 tablet by mouth daily.  . Multiple Vitamin (MULTI-VITAMINS) TABS Take 1 tablet daily by mouth.   . Omega-3 Fatty Acids (OMEGA 3 PO) Take 1,000 mg 2 (two) times daily by mouth.   . polyethylene glycol (MIRALAX / GLYCOLAX) packet Take 17 g by mouth every other day.  . senna-docusate (SENOKOT-S) 8.6-50 MG tablet Take 2 tablets 2 (two) times daily by mouth.   . sertraline (ZOLOFT) 50 MG tablet Take 50 mg by mouth at bedtime.  Marland Kitchen telmisartan (MICARDIS) 80 MG tablet Take 80 mg daily by mouth.  . triamcinolone (NASACORT) 55 MCG/ACT AERO nasal inhaler Place 2 sprays into the nose at bedtime.  . vitamin C (ASCORBIC ACID) 500 MG tablet Take 500 mg by mouth daily.   No facility-administered encounter medications on file as of 02/06/2019.     Review of Systems  Unable to perform ROS: Dementia    Immunization History  Administered Date(s) Administered  . Influenza Inj Mdck Quad Pf 08/09/2017  . Influenza, High Dose Seasonal PF 07/31/2016  . Influenza,inj,Quad PF,6+ Mos 08/06/2018  . Influenza-Unspecified 08/01/2010, 07/30/2012, 10/16/2012, 10/16/2013  . Pneumococcal Polysaccharide-23 02/23/2004, 02/21/2013  . Pneumococcal-Unspecified 03/17/2016  . Td 12/02/2008  . Tdap 09/01/2016  . Zoster 03/28/2006  . Zoster Recombinat (Shingrix) 10/29/2017, 01/24/2018   Pertinent  Health Maintenance Due  Topic Date Due  . INFLUENZA VACCINE  05/17/2019  . PNA vac Low Risk Adult  Completed   Fall Risk  01/04/2019 07/17/2018 08/21/2017 04/04/2017 02/26/2017  Falls in the past year? Exclusion - non ambulatory No No No No   Functional Status  Survey:    Vitals:   02/07/19 0858  Temp: 98.8 F (37.1 C)   There is no height or weight on file to calculate BMI. Physical Exam Vitals signs and nursing note reviewed.  Constitutional:      General: He is not in acute distress.    Appearance: Normal appearance. He is normal weight.  HENT:     Right Ear: Tympanic membrane, ear canal and external ear normal. There is no impacted cerumen.     Left Ear: Tympanic membrane, ear canal and external ear normal. There is no impacted cerumen.     Nose: No congestion.     Mouth/Throat:     Lips: No lesions.     Mouth: Mucous membranes are dry. No injury or oral lesions.     Dentition: Normal dentition. Does not have dentures. No dental tenderness,  gingival swelling, dental caries, dental abscesses or gum lesions.     Tongue: No lesions. Tongue does not deviate from midline.     Palate: No mass and lesions.     Pharynx: Oropharynx is clear. No oropharyngeal exudate or posterior oropharyngeal erythema.     Tonsils: No tonsillar exudate or tonsillar abscesses.  Eyes:     General:        Right eye: No discharge.        Left eye: No discharge.     Conjunctiva/sclera: Conjunctivae normal.     Pupils: Pupils are equal, round, and reactive to light.  Neck:     Musculoskeletal: No neck rigidity or muscular tenderness.  Cardiovascular:     Rate and Rhythm: Normal rate and regular rhythm.     Heart sounds: No murmur.  Pulmonary:     Effort: Pulmonary effort is normal.     Breath sounds: Rhonchi present.  Abdominal:     General: Abdomen is flat. Bowel sounds are normal.     Palpations: Abdomen is soft.  Musculoskeletal:     Right lower leg: No edema.     Left lower leg: No edema.  Lymphadenopathy:     Cervical: No cervical adenopathy.  Skin:    General: Skin is warm and dry.  Neurological:     General: No focal deficit present.     Mental Status: He is alert. Mental status is at baseline.  Psychiatric:        Mood and Affect: Mood  normal.     Labs reviewed: Recent Labs    05/20/18 12/12/18 0600  NA 139 138  K 4.2 4.0  BUN 16 16  CREATININE 1.0 0.8   No results for input(s): AST, ALT, ALKPHOS, BILITOT, PROT, ALBUMIN in the last 8760 hours. Recent Labs    05/20/18  WBC 9.1  HGB 12.6*  HCT 39*  PLT 318   Lab Results  Component Value Date   TSH 3.14 01/18/2018   Lab Results  Component Value Date   HGBA1C 6.8 (H) 09/07/2016   Lab Results  Component Value Date   CHOL 127 05/11/2017   HDL 50 05/11/2017   LDLCALC 58 05/11/2017   TRIG 98 05/11/2017    Significant Diagnostic Results in last 30 days:  No results found.  Assessment/Plan 1. Mouth pain Improved per resident. No sign of dental infection. Normal exam in regards to the ear, jaw, lymph nodes. Nurse to monitor resident and report if this re occurs. May need dental eval when able. (currently they are not coming to the facility due to Covid 19)   Family/ staff Communication: discussed with his nurse  Labs/tests ordered:  NA

## 2019-02-12 ENCOUNTER — Telehealth: Payer: Self-pay

## 2019-02-12 NOTE — Telephone Encounter (Signed)
I contacted the patient and left a vm in regards to her 02/13/19 appt.  Trying to verify if we could do a virtual video visit or if he does not have the capability a telephone visit.

## 2019-02-13 ENCOUNTER — Ambulatory Visit: Payer: Medicare Other | Admitting: Neurology

## 2019-02-27 ENCOUNTER — Other Ambulatory Visit: Payer: Self-pay

## 2019-02-27 ENCOUNTER — Non-Acute Institutional Stay (SKILLED_NURSING_FACILITY): Payer: Medicare Other | Admitting: Adult Health

## 2019-02-27 ENCOUNTER — Encounter: Payer: Self-pay | Admitting: Adult Health

## 2019-02-27 DIAGNOSIS — Z960 Presence of urogenital implants: Secondary | ICD-10-CM

## 2019-02-27 DIAGNOSIS — G309 Alzheimer's disease, unspecified: Secondary | ICD-10-CM | POA: Diagnosis not present

## 2019-02-27 DIAGNOSIS — F0280 Dementia in other diseases classified elsewhere without behavioral disturbance: Secondary | ICD-10-CM

## 2019-02-27 DIAGNOSIS — I679 Cerebrovascular disease, unspecified: Secondary | ICD-10-CM

## 2019-02-27 DIAGNOSIS — G20 Parkinson's disease: Secondary | ICD-10-CM | POA: Diagnosis not present

## 2019-02-27 DIAGNOSIS — I10 Essential (primary) hypertension: Secondary | ICD-10-CM | POA: Diagnosis not present

## 2019-02-27 DIAGNOSIS — F0150 Vascular dementia without behavioral disturbance: Secondary | ICD-10-CM

## 2019-02-27 DIAGNOSIS — N401 Enlarged prostate with lower urinary tract symptoms: Secondary | ICD-10-CM

## 2019-02-27 DIAGNOSIS — K219 Gastro-esophageal reflux disease without esophagitis: Secondary | ICD-10-CM

## 2019-02-27 DIAGNOSIS — H903 Sensorineural hearing loss, bilateral: Secondary | ICD-10-CM

## 2019-02-27 DIAGNOSIS — R338 Other retention of urine: Secondary | ICD-10-CM

## 2019-02-27 DIAGNOSIS — Z978 Presence of other specified devices: Secondary | ICD-10-CM

## 2019-02-27 NOTE — Progress Notes (Signed)
Location:  [facility] of Service:  SNF (31) Provider:   Cindi Carbon, ANP [place] [phone]   [name], DO  Patient Care Team: [name], DO as PCP - General (Geriatric Medicine) Danella Sensing, MD as Consulting Physician (Dermatology) Sharyne Peach, MD as Consulting Physician (Ophthalmology) Irine Seal, MD as Attending Physician (Urology) [name], MD as Consulting Physician (Neurology)  Extended Emergency Contact Information Primary Emergency Contact: Doublin,[name] Address: [address]          [place] [address] [name] of [place] Phone: [phone] Relation: Spouse Secondary Emergency Contact: [name] States of [place] Mobile Phone: [phone] Relation: Daughter  Code Status:  DNR Goals of care: Advanced Directive information Advanced Directives 12/24/2018  Does Patient Have a Medical Advance Directive? Yes  Type of Paramedic of [place];Living will;Out of facility DNR (pink MOST or yellow form)  Does patient want to make changes to medical advance directive? No - Patient declined  Copy of [place] in Chart? Yes - validated most recent copy scanned in chart (See row information)  Would patient like information on creating a medical advance directive? -  Pre-existing out of facility DNR order (yellow form or pink MOST form) Yellow form placed in chart (order not valid for inpatient use)     Chief Complaint  Patient presents with   Medical Management of Chronic Issues    HPI:  Pt is a 85 y.o. male seen today for medical management of chronic diseases.    Mixed dementia: MMSE 17/30 11/10/18. No change in cognition recently. Resides in skilled care is non ambulatory. Hx of TIA and cerebrovascular disease.   Has some rigidity and needs assistance with dressing and bathing, currently on sinemet for parkinsonism  No  reports of issues with reflux, burning, abd pain, etc.  No reported concerns regarding his catheter in place due to urinary retention associated with BPH.    BP controlled  He has lost 7 lbs since March. He is likely eating less due to lack of socialization from Covid 19 restrictions.   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 Hart Robinsons 2002   knee injections Right    Dr. Adriana Mccallum   TONSILLECTOMY     done as a child    No Known Allergies  Outpatient Encounter Medications as of 02/27/2019  Medication Sig   Co-Enzyme Q-10 100 MG CAPS Take 100 mg by mouth daily.  Cranberry 400 MG CAPS Take 1 capsule by mouth 3 (three) times daily.   hydrochlorothiazide (MICROZIDE) 12.5 MG capsule Take 25 mg by mouth daily.    Lutein-Zeaxanthin 25-5 MG CAPS Take 1 capsule by mouth daily.   Memantine HCl-Donepezil HCl (NAMZARIC) 28-10 MG CP24 Take 1 capsule at bedtime by mouth.    Menthol, Topical Analgesic, (BIOFREEZE) 4 % GEL Apply 1 application topically 2 (two) times daily as needed (pain).    Misc Natural Products (GLUCOSAMINE CHOND COMPLEX/MSM PO) Take 1 tablet by mouth daily.   Multiple Vitamin (MULTI-VITAMINS) TABS  Take 1 tablet daily by mouth.    Omega-3 Fatty Acids (OMEGA 3 PO) Take 1,000 mg 2 (two) times daily by mouth.    polyethylene glycol (MIRALAX / GLYCOLAX) packet Take 17 g by mouth every other day.   senna-docusate (SENOKOT-S) 8.6-50 MG tablet Take 2 tablets 2 (two) times daily by mouth.    sertraline (ZOLOFT) 50 MG tablet Take 50 mg by mouth at bedtime.   telmisartan (MICARDIS) 80 MG tablet Take 80 mg daily by mouth.   triamcinolone (NASACORT) 55 MCG/ACT AERO nasal inhaler Place 2 sprays into the nose at bedtime.   vitamin C (ASCORBIC ACID) 500 MG tablet Take 500 mg by mouth daily.   [DISCONTINUED] Cranberry-Vit C-Lactobacillus (RA CRANBERRY SUPPLEMENTS PO) Take 1 capsule by mouth 3 (three) times daily.    amLODipine (NORVASC) 10 MG tablet Take 1 tablet (10 mg total) by mouth daily.   ammonium lactate (AMLACTIN) 12 % cream Apply 1 Bottle topically at bedtime.    aspirin EC 81 MG tablet Take 81 mg by mouth daily.   Calcium Carb-Cholecalciferol 600-800 MG-UNIT CHEW Chew 1 tablet by mouth 2 (two) times daily.    carbidopa-levodopa (SINEMET IR) 25-100 MG tablet Take 1.5 tablets by mouth 3 (three) times daily.   carboxymethylcellulose (REFRESH PLUS) 0.5 % SOLN Apply 2 drops to eye as needed (dry eyes).    clopidogrel (PLAVIX) 75 MG tablet Take 75 mg by mouth daily.   pantoprazole (PROTONIX) 40 MG tablet Take 1 tablet (40 mg total) by mouth daily.   No facility-administered encounter medications on file as of 02/27/2019.     Review of Systems  Constitutional: Positive for unexpected weight change. Negative for activity change, appetite change, chills, diaphoresis, fatigue and fever.  Respiratory: Positive for cough (chronic dry). Negative for shortness of breath, wheezing and stridor.   Cardiovascular: Negative for chest pain, palpitations and leg swelling.  Gastrointestinal: Negative for abdominal distention, abdominal pain, constipation and diarrhea.  Genitourinary: Negative for  difficulty urinating, dysuria, flank pain, frequency and hematuria.  Musculoskeletal: Positive for gait problem. Negative for arthralgias, back pain, joint swelling and myalgias.  Neurological: Negative for dizziness, seizures, syncope, facial asymmetry, speech difficulty, weakness and headaches.  Hematological: Negative for adenopathy. Does not bruise/bleed easily.  Psychiatric/Behavioral: Positive for confusion. Negative for agitation and behavioral problems.    Immunization History  Administered Date(s) Administered   Influenza Inj Mdck Quad Pf 08/09/2017   Influenza, High Dose Seasonal PF 07/31/2016   Influenza,inj,Quad PF,6+ Mos 08/06/2018   Influenza-Unspecified 08/01/2010, 07/30/2012, 10/16/2012, 10/16/2013   Pneumococcal Polysaccharide-23 02/23/2004, 02/21/2013   Pneumococcal-Unspecified 03/17/2016   Td 12/02/2008   Tdap 09/01/2016   Zoster 03/28/2006   Zoster Recombinat (Shingrix) 10/29/2017, 01/24/2018   Pertinent  Health Maintenance Due  Topic Date Due   INFLUENZA VACCINE  05/17/2019   PNA vac Low Risk Adult  Completed   Fall Risk  01/04/2019 07/17/2018 08/21/2017 04/04/2017 02/26/2017  Falls  in the past year? Exclusion - non ambulatory No No No No   Functional Status Survey:    Vitals:   02/27/19 1018  BP: 116/60  Pulse: 66  Resp: 16  Temp: 98.2 F (36.8 C)  SpO2: 96%  Weight: 168 lb 6.4 oz (76.4 kg)   Body mass index is 25.61 kg/m.  Wt Readings from Last 3 Encounters:  02/27/19 168 lb 6.4 oz (76.4 kg)  01/25/19 174 lb 12.8 oz (79.3 kg)  12/24/18 175 lb (79.4 kg)   Physical Exam Vitals signs and nursing note reviewed.  Constitutional:      General: He is not in acute distress.    Appearance: He is not diaphoretic.  HENT:     Head: Normocephalic and atraumatic.  Neck:     Thyroid: No thyromegaly.     Vascular: No JVD.     Trachea: No tracheal deviation.  Cardiovascular:     Heart sounds: No murmur.  Pulmonary:     Effort: Pulmonary  effort is normal. No respiratory distress.  Musculoskeletal:     Right lower leg: No edema.     Left lower leg: No edema.  Skin:    General: Skin is warm and dry.  Neurological:     Mental Status: He is alert. Mental status is at baseline.  Psychiatric:        Mood and Affect: Mood normal.     Labs reviewed: Recent Labs    05/20/18 12/12/18 0600  NA 139 138  K 4.2 4.0  BUN 16 16  CREATININE 1.0 0.8   No results for input(s): AST, ALT, ALKPHOS, BILITOT, PROT, ALBUMIN in the last 8760 hours. Recent Labs    05/20/18  WBC 9.1  HGB 12.6*  HCT 39*  PLT 318   Lab Results  Component Value Date   TSH 3.14 01/18/2018   Lab Results  Component Value Date   HGBA1C 6.8 (H) 09/07/2016   Lab Results  Component Value Date   CHOL 127 05/11/2017   HDL 50 05/11/2017   LDLCALC 58 05/11/2017   TRIG 98 05/11/2017    Significant Diagnostic Results in last 30 days:  No results found.  Assessment/Plan 1. Essential hypertension Controlled, continue norvasc and hctz  2. Cerebrovascular disease Associated hx of TIA and vascular dementia Continue asa and plavix BP control  3. Mixed Alzheimer's and vascular dementia ([place]) Unchanged MMSE score over the past year although he has moderate to severe dementia and dependency on staff for all ADLs MMSE 17/30 11/10/18  4. Gastroesophageal reflux disease without esophagitis Controlled with protonix 40 mg qd  5. Parkinsonism, unspecified Parkinsonism type ([place]) No new issues Continue sinemet, followed by Dr. Jannifer Franklin  6. Sensorineural hearing loss (SNHL), bilateral Wears hearing aides  7. Benign prostatic hyperplasia with urinary retention No urinary symptoms reported   8. Chronic indwelling Foley catheter Followed by Urology  Continue cath maintenance per [facility] protocol   Family/ staff Communication: staff  Labs/tests ordered:  NA

## 2019-03-25 ENCOUNTER — Non-Acute Institutional Stay (SKILLED_NURSING_FACILITY): Payer: Medicare Other | Admitting: Internal Medicine

## 2019-03-25 ENCOUNTER — Encounter: Payer: Self-pay | Admitting: Internal Medicine

## 2019-03-25 DIAGNOSIS — Z978 Presence of other specified devices: Secondary | ICD-10-CM

## 2019-03-25 DIAGNOSIS — G20 Parkinson's disease: Secondary | ICD-10-CM

## 2019-03-25 DIAGNOSIS — Z960 Presence of urogenital implants: Secondary | ICD-10-CM | POA: Diagnosis not present

## 2019-03-25 DIAGNOSIS — N401 Enlarged prostate with lower urinary tract symptoms: Secondary | ICD-10-CM | POA: Diagnosis not present

## 2019-03-25 DIAGNOSIS — G309 Alzheimer's disease, unspecified: Secondary | ICD-10-CM | POA: Diagnosis not present

## 2019-03-25 DIAGNOSIS — R338 Other retention of urine: Secondary | ICD-10-CM

## 2019-03-25 DIAGNOSIS — I679 Cerebrovascular disease, unspecified: Secondary | ICD-10-CM

## 2019-03-25 DIAGNOSIS — G20C Parkinsonism, unspecified: Secondary | ICD-10-CM

## 2019-03-25 DIAGNOSIS — F0280 Dementia in other diseases classified elsewhere without behavioral disturbance: Secondary | ICD-10-CM

## 2019-03-25 DIAGNOSIS — F0150 Vascular dementia without behavioral disturbance: Secondary | ICD-10-CM

## 2019-03-25 NOTE — Progress Notes (Signed)
Patient ID: Kenneth Stark, male   DOB: 15-Jul-1925, 85 y.o.   MRN: [id number]  Location:  [place] [place] Room Number: [place] of Service:  SNF [phone]) Provider:   [name], DO  Patient Care Team: [name], DO as PCP - General (Geriatric Medicine) Danella Sensing, MD as Consulting Physician (Dermatology) Sharyne Peach, MD as Consulting Physician (Ophthalmology) Irine Seal, MD as Attending Physician (Urology) [name], MD as Consulting Physician (Neurology)  Extended Emergency Contact Information Primary Emergency Contact: Benak,[name] Address: [address]          [place] [address] [name] of [place] Phone: [phone] Relation: Spouse Secondary Emergency Contact: [name] States of [place] Mobile Phone: [phone] Relation: Daughter  Code Status:  DNR Goals of care: Advanced Directive information Advanced Directives 12/24/2018  Does Patient Have a Medical Advance Directive? Yes  Type of Paramedic of [place];Living will;Out of facility DNR (pink MOST or yellow form)  Does patient want to make changes to medical advance directive? No - Patient declined  Copy of [place] in Chart? Yes - validated most recent copy scanned in chart (See row information)  Would patient like information on creating a medical advance directive? -  Pre-existing out of facility DNR order (yellow form or pink MOST form) Yellow form placed in chart (order not valid for inpatient use)     Chief Complaint  Patient presents with  . Medical Management of Chronic Issues    Routine Visit    HPI:  Pt is a 85 y.o. male seen today for medical management of chronic diseases.  He lives in SNF s/p traumatic fall with subarachnoid hemorrhage when he already had some baseline memory loss and vascular parkinsonism.  He has an indwelling foley catheter for his bph and urinary  retention.    When I first came by to visit him, he was having a visit with his wife who lives in [place].  About an hour later, I was able to see him.  He was alert and speaking a little.  He denied pain, any difficulty sleeping, any chest pain, shortness of breath.    His weight is down 4 lbs over a month.  Staff report no new symptoms or concerns with him at the present moment.  Reviewing notes, he's had some blood in his urine earlier in the month and later on had an episode of coughing with recovery.  Thus far, no fevers with covid-19 and sats wnl.  Past Medical History:  Diagnosis Date  . Abnormality of gait   . Arthritis   . Brain bleed ([place]) 09/01/2016  . Cervical spondylosis   . Degenerative joint disease (DJD) of lumbar spine   . Depression   . Diplopia   . Dyslipidemia   . Essential tremor   . Foul smelling urine   . Frequent falls   . GERD (gastroesophageal reflux disease)   . Glycosuria   . Hearing difficulty    hearing aids  . Hyperlipidemia   . Hypertension   . Hyperthyroidism   . Memory loss   . Osteoarthritis   . Radiculopathy, lumbar region   . 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 Small vessel disease ([place])   . TIA (transient ischemic attack)    Past Surgical History:  Procedure Laterality Date  . APPENDECTOMY     done as a child  .  GALLBLADDER SURGERY  2008  . KNEE ARTHROSCOPY Left    Dr. Hart Robinsons 2002  . knee injections Right    Dr. Adriana Mccallum  . TONSILLECTOMY     done as a child    No Known Allergies  Outpatient Encounter Medications as of 03/25/2019  Medication Sig  . amLODipine (NORVASC) 10 MG tablet Take 1 tablet (10 mg total) by mouth daily.  Marland Kitchen ammonium lactate (AMLACTIN) 12 % cream Apply 1 Bottle topically at bedtime.   Marland Kitchen aspirin EC 81 MG tablet Take 81 mg by mouth daily.  . Calcium Carb-Cholecalciferol 600-800 MG-UNIT CHEW Chew 1 tablet by mouth 2 (two) times daily.   . carbidopa-levodopa (SINEMET IR) 25-100  MG tablet Take 1.5 tablets by mouth 3 (three) times daily.  . carboxymethylcellulose (REFRESH PLUS) 0.5 % SOLN Apply 2 drops to eye as needed (dry eyes).   . clopidogrel (PLAVIX) 75 MG tablet Take 75 mg by mouth daily.  Marland Kitchen Co-Enzyme Q-10 100 MG CAPS Take 100 mg by mouth daily.  . Cranberry 400 MG CAPS Take 1 capsule by mouth 3 (three) times daily.  . hydrochlorothiazide (MICROZIDE) 12.5 MG capsule Take 25 mg by mouth daily.   Marland Kitchen ipratropium-albuterol (DUONEB) 0.5-2.5 (3) MG/3ML SOLN Take 3 mLs by nebulization as needed (cough).  . Lutein-Zeaxanthin 25-5 MG CAPS Take 1 capsule by mouth daily.  Marland Kitchen MELATONIN-CHAMOMILE PO Take by mouth at bedtime as needed.  . Memantine HCl-Donepezil HCl (NAMZARIC) 28-10 MG CP24 Take 1 capsule at bedtime by mouth.   . Menthol, Topical Analgesic, (BIOFREEZE) 4 % GEL Apply 1 application topically 2 (two) times daily as needed (pain).   . Misc Natural Products (GLUCOSAMINE CHOND COMPLEX/MSM PO) Take 1 tablet by mouth daily.  . Multiple Vitamin (MULTI-VITAMINS) TABS Take 1 tablet daily by mouth.   . Omega-3 Fatty Acids (OMEGA 3 PO) Take 1,000 mg 2 (two) times daily by mouth.   . polyethylene glycol (MIRALAX / GLYCOLAX) packet Take 17 g by mouth every other day.  . senna-docusate (SENOKOT-S) 8.6-50 MG tablet Take 2 tablets 2 (two) times daily by mouth.   . sertraline (ZOLOFT) 50 MG tablet Take 50 mg by mouth at bedtime.  Marland Kitchen telmisartan (MICARDIS) 80 MG tablet Take 80 mg daily by mouth.  . triamcinolone (NASACORT) 55 MCG/ACT AERO nasal inhaler Place 2 sprays into the nose at bedtime.  . vitamin C (ASCORBIC ACID) 500 MG tablet Take 500 mg by mouth daily.  . pantoprazole (PROTONIX) 40 MG tablet Take 1 tablet (40 mg total) by mouth daily.   No facility-administered encounter medications on file as of 03/25/2019.     Review of Systems  Constitutional: Negative for activity change, appetite change and fever.       Weight loss  HENT: Positive for trouble swallowing. Negative  for congestion.   Eyes: Negative for visual disturbance.       Glasses  Respiratory: Positive for cough. Negative for shortness of breath.   Cardiovascular: Negative for chest pain, palpitations and leg swelling.  Gastrointestinal: Negative for abdominal pain, diarrhea, nausea and vomiting.  Genitourinary: Negative for dysuria, frequency, hematuria and urgency.       Has foley catheter  Musculoskeletal: Positive for gait problem. Negative for arthralgias and back pain.  Neurological: Negative for dizziness and weakness.  Psychiatric/Behavioral: Positive for confusion. Negative for agitation and sleep disturbance. The patient is not nervous/anxious.     Immunization History  Administered Date(s) Administered  . Influenza Inj Mdck Quad Pf 08/09/2017  .  Influenza, High Dose Seasonal PF 07/31/2016  . Influenza,inj,Quad PF,6+ Mos 08/06/2018  . Influenza-Unspecified 08/01/2010, 07/30/2012, 10/16/2012, 10/16/2013  . Pneumococcal Polysaccharide-23 02/23/2004, 02/21/2013  . Pneumococcal-Unspecified 03/17/2016  . Td 12/02/2008  . Tdap 09/01/2016  . Zoster 03/28/2006  . Zoster Recombinat (Shingrix) 10/29/2017, 01/24/2018   Pertinent  Health Maintenance Due  Topic Date Due  . INFLUENZA VACCINE  05/17/2019  . PNA vac Low Risk Adult  Completed   Fall Risk  01/04/2019 07/17/2018 08/21/2017 04/04/2017 02/26/2017  Falls in the past year? Exclusion - non ambulatory No No No No   Functional Status Survey:    Vitals:   03/25/19 1105  BP: (!) 144/72  Pulse: 70  Resp: 16  Temp: (!) 97 F (36.1 C)  TempSrc: Oral  SpO2: 98%  Weight: 164 lb (74.4 kg)  Height: 5\' 8"  (1.727 m)   Body mass index is 24.94 kg/m. Physical Exam Vitals signs and nursing note reviewed.  Constitutional:      General: He is not in acute distress.    Appearance: Normal appearance. He is normal weight. He is not ill-appearing or toxic-appearing.  HENT:     Head: Normocephalic and atraumatic.  Eyes:     Comments:  glasses  Pulmonary:     Effort: Pulmonary effort is normal.     Breath sounds: Normal breath sounds. No wheezing, rhonchi or rales.  Abdominal:     General: Bowel sounds are normal.  Musculoskeletal:     Comments: Uses manual wheelchair with high back; requires hoyer for transfers  Skin:    General: Skin is warm and dry.  Neurological:     Mental Status: He is alert. Mental status is at baseline.  Psychiatric:        Mood and Affect: Mood normal.     Labs reviewed: Recent Labs    05/20/18 12/12/18 0600  NA 139 138  K 4.2 4.0  BUN 16 16  CREATININE 1.0 0.8   No results for input(s): AST, ALT, ALKPHOS, BILITOT, PROT, ALBUMIN in the last 8760 hours. Recent Labs    05/20/18  WBC 9.1  HGB 12.6*  HCT 39*  PLT 318   Lab Results  Component Value Date   TSH 3.14 01/18/2018   Lab Results  Component Value Date   HGBA1C 6.8 (H) 09/07/2016   Lab Results  Component Value Date   CHOL 127 05/11/2017   HDL 50 05/11/2017   LDLCALC 58 05/11/2017   TRIG 98 05/11/2017    Assessment/Plan 1. Mixed Alzheimer's and vascular dementia ([place]) -continues with intermittent confusion and periods of more alertness - cont SNF level of care for ADLs  2. Parkinsonism, unspecified Parkinsonism type ([place]) -no recent changes here; continues on sinemet therapy  3. Cerebrovascular disease -with prior subarachnoid hemorrhage and white matter ischemic changes on prior imaging -is on plavix  4. Benign prostatic hyperplasia with urinary retention -cont catheter with regular changes and monitoring  5. Chronic indwelling Foley catheter -remains in place, one episode of hematuria that resolved  Family/ staff Communication: discussed with SNF nurse  Labs/tests ordered:  No new  Diante Barley L. Kaidance Pantoja, D.O. [facility] [address], [place] [address] Cell Phone (Mon-Fri 8am-5pm):  [phone] On Call:  [phone] & follow prompts after 5pm &  weekends Office Phone:  [phone] Office Fax:  [phone]

## 2019-03-26 ENCOUNTER — Telehealth: Payer: Self-pay | Admitting: Neurology

## 2019-03-26 NOTE — Telephone Encounter (Signed)
Due to current COVID 19 pandemic, our office is severely reducing in office visits until further notice, in order to minimize the risk to our patients and healthcare providers.   Called and spoke with wife regarding 6/15 appointment. Patient is currently in skilled care at [facility]. I offered a virtual visit or telephone call and wife directed me to call the nurse on patient's hall. I called [facility] at [phone] and did not get an answer after 2 minutes of ringing. I will try to contact them again later to schedule a virtual visit for patient.

## 2019-03-27 NOTE — Telephone Encounter (Signed)
Called [facility] again today and was able to reach a nurse on patient's hall. She states that a virtual visit will be fine and the nurse who will look after patient on Monday, [place], will assist patient. I have sent her an e-mail with link & directions.

## 2019-03-31 ENCOUNTER — Ambulatory Visit: Payer: Medicare Other | Admitting: Neurology

## 2019-03-31 ENCOUNTER — Non-Acute Institutional Stay (SKILLED_NURSING_FACILITY): Payer: Medicare Other | Admitting: Adult Health

## 2019-03-31 ENCOUNTER — Encounter: Payer: Self-pay | Admitting: Adult Health

## 2019-03-31 DIAGNOSIS — N3000 Acute cystitis without hematuria: Secondary | ICD-10-CM

## 2019-03-31 DIAGNOSIS — R319 Hematuria, unspecified: Secondary | ICD-10-CM | POA: Diagnosis not present

## 2019-03-31 DIAGNOSIS — Z79899 Other long term (current) drug therapy: Secondary | ICD-10-CM | POA: Diagnosis not present

## 2019-03-31 DIAGNOSIS — N390 Urinary tract infection, site not specified: Secondary | ICD-10-CM | POA: Diagnosis not present

## 2019-03-31 NOTE — Progress Notes (Addendum)
Location:  Occupational psychologist of Service:  SNF (31) Provider:   Cindi Carbon, ANP [place] [phone]       [name], DO  Patient Care Team: [name], DO as PCP - General (Geriatric Medicine) Danella Sensing, MD as Consulting Physician (Dermatology) Sharyne Peach, MD as Consulting Physician (Ophthalmology) Irine Seal, MD as Attending Physician (Urology) [name], MD as Consulting Physician (Neurology)  Extended Emergency Contact Information Primary Emergency Contact: Cu,[name] Address: [address]          [place] [address] Kenneth Stark of [place] Phone: [phone] Relation: Spouse Secondary Emergency Contact: [name] [place] of [place] Mobile Phone: [phone] Relation: Daughter  Code Status:  DNR Goals of care: Advanced Directive information Advanced Directives 12/24/2018  Does Patient Have a Medical Advance Directive? Yes  Type of Paramedic of [place];Living will;Out of facility DNR (pink MOST or yellow form)  Does patient want to make changes to medical advance directive? No - Patient declined  Copy of [place] in Chart? Yes - validated most recent copy scanned in chart (See row information)  Would patient like information on creating a medical advance directive? -  Pre-existing out of facility DNR order (yellow form or pink MOST form) Yellow form placed in chart (order not valid for inpatient use)     Chief Complaint  Patient presents with  . Acute Visit    temp and lethargy    HPI:  Pt is a 85 y.o. male seen today for an acute visit for lethargy and temp.he has dementia and therefore most of the history is obtained by the staff.  The staff report he has been sleepy and had a low grade temp since 6/11.  Oncall psc provider was notified on 6/14 and UA C and S was ordered and increased oral fluid. Rocephin was also ordered  for two days on 6/14 and 6/15.  He has a hx of bph with urinary retention and has a catheter. There are no reports of abd pain, back pain, hematuria, etc. His cath urine is purulent per staff. He is eating and drinking well. Urine out put adequate. Vitals stable. Temp down to 98.8. He is more alert as well compared to the day prior.  Covid test 1 is negative, 2nd test is pending. He remains on contact and droplet isolation.  Past Medical History:  Diagnosis Date  . Abnormality of gait   . Arthritis   . Brain bleed ([place]) 09/01/2016  . Cervical spondylosis   . Degenerative joint disease (DJD) of lumbar spine   . Depression   . Diplopia   . Dyslipidemia   . Essential tremor   . Foul smelling urine   . Frequent falls   . GERD (gastroesophageal reflux disease)   . Glycosuria   . Hearing difficulty    hearing aids  . Hyperlipidemia   . Hypertension   . Hyperthyroidism   . Memory loss   . Osteoarthritis   . Radiculopathy, lumbar region   . 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 Small vessel disease ([place])   . TIA (transient ischemic attack)    Past Surgical History:  Procedure Laterality Date  . APPENDECTOMY     done as a child  . GALLBLADDER SURGERY  2008  . KNEE ARTHROSCOPY Left    Dr. Hart Robinsons 2002  . knee injections Right  Dr. Adriana Mccallum  . TONSILLECTOMY     done as a child    No Known Allergies  Outpatient Encounter Medications as of 03/31/2019  Medication Sig  . amLODipine (NORVASC) 10 MG tablet Take 1 tablet (10 mg total) by mouth daily.  Marland Kitchen ammonium lactate (AMLACTIN) 12 % cream Apply 1 Bottle topically at bedtime.   Marland Kitchen aspirin EC 81 MG tablet Take 81 mg by mouth daily.  . Calcium Carb-Cholecalciferol 600-800 MG-UNIT CHEW Chew 1 tablet by mouth 2 (two) times daily.   . carbidopa-levodopa (SINEMET IR) 25-100 MG tablet Take 1.5 tablets by mouth 3 (three) times daily.  . carboxymethylcellulose (REFRESH PLUS) 0.5 % SOLN Apply 2 drops to eye  as needed (dry eyes).   . clopidogrel (PLAVIX) 75 MG tablet Take 75 mg by mouth daily.  Marland Kitchen Co-Enzyme Q-10 100 MG CAPS Take 100 mg by mouth daily.  . Cranberry 400 MG CAPS Take 1 capsule by mouth 3 (three) times daily.  . hydrochlorothiazide (MICROZIDE) 12.5 MG capsule Take 25 mg by mouth daily.   Marland Kitchen ipratropium-albuterol (DUONEB) 0.5-2.5 (3) MG/3ML SOLN Take 3 mLs by nebulization as needed (cough).  . Lutein-Zeaxanthin 25-5 MG CAPS Take 1 capsule by mouth daily.  Marland Kitchen MELATONIN-CHAMOMILE PO Take by mouth at bedtime as needed.  . Memantine HCl-Donepezil HCl (NAMZARIC) 28-10 MG CP24 Take 1 capsule at bedtime by mouth.   . Menthol, Topical Analgesic, (BIOFREEZE) 4 % GEL Apply 1 application topically 2 (two) times daily as needed (pain).   . Misc Natural Products (GLUCOSAMINE CHOND COMPLEX/MSM PO) Take 1 tablet by mouth daily.  . Multiple Vitamin (MULTI-VITAMINS) TABS Take 1 tablet daily by mouth.   . Omega-3 Fatty Acids (OMEGA 3 PO) Take 1,000 mg 2 (two) times daily by mouth.   . pantoprazole (PROTONIX) 40 MG tablet Take 1 tablet (40 mg total) by mouth daily.  . polyethylene glycol (MIRALAX / GLYCOLAX) packet Take 17 g by mouth every other day.  . senna-docusate (SENOKOT-S) 8.6-50 MG tablet Take 2 tablets 2 (two) times daily by mouth.   . sertraline (ZOLOFT) 50 MG tablet Take 50 mg by mouth at bedtime.  Marland Kitchen telmisartan (MICARDIS) 80 MG tablet Take 80 mg daily by mouth.  . triamcinolone (NASACORT) 55 MCG/ACT AERO nasal inhaler Place 2 sprays into the nose at bedtime.  . vitamin C (ASCORBIC ACID) 500 MG tablet Take 500 mg by mouth daily.   No facility-administered encounter medications on file as of 03/31/2019.     Review of Systems  Unable to perform ROS: Dementia    Immunization History  Administered Date(s) Administered  . Influenza Inj Mdck Quad Pf 08/09/2017  . Influenza, High Dose Seasonal PF 07/31/2016  . Influenza,inj,Quad PF,6+ Mos 08/06/2018  . Influenza-Unspecified 08/01/2010,  07/30/2012, 10/16/2012, 10/16/2013  . Pneumococcal Polysaccharide-23 02/23/2004, 02/21/2013  . Pneumococcal-Unspecified 03/17/2016  . Td 12/02/2008  . Tdap 09/01/2016  . Zoster 03/28/2006  . Zoster Recombinat (Shingrix) 10/29/2017, 01/24/2018   Pertinent  Health Maintenance Due  Topic Date Due  . INFLUENZA VACCINE  05/17/2019  . PNA vac Low Risk Adult  Completed   Fall Risk  01/04/2019 07/17/2018 08/21/2017 04/04/2017 02/26/2017  Falls in the past year? Exclusion - non ambulatory No No No No   Functional Status Survey:    Vitals:   03/31/19 1618  BP: 126/75  Pulse: 81  Resp: 17  Temp: 100.2 F (37.9 C)  SpO2: 95%   There is no height or weight on file to calculate BMI. Physical  Exam Constitutional:      General: He is not in acute distress.    Appearance: He is not diaphoretic.  HENT:     Head: Normocephalic and atraumatic.  Neck:     Musculoskeletal: Normal range of motion and neck supple.     Thyroid: No thyromegaly.     Vascular: No JVD.     Trachea: No tracheal deviation.  Cardiovascular:     Rate and Rhythm: Normal rate and regular rhythm.     Heart sounds: No murmur.  Pulmonary:     Effort: Pulmonary effort is normal. No respiratory distress.     Breath sounds: Rales present. No wheezing.  Abdominal:     General: Bowel sounds are normal. There is no distension.     Palpations: Abdomen is soft.     Tenderness: There is no abdominal tenderness. There is no right CVA tenderness or left CVA tenderness.  Genitourinary:    Comments: Urine purulent and dark yellow Lymphadenopathy:     Cervical: No cervical adenopathy.  Skin:    General: Skin is warm and dry.  Neurological:     General: No focal deficit present.     Mental Status: He is alert. Mental status is at baseline.     Labs reviewed: Recent Labs    05/20/18 12/12/18 0600  NA 139 138  K 4.2 4.0  BUN 16 16  CREATININE 1.0 0.8   No results for input(s): AST, ALT, ALKPHOS, BILITOT, PROT, ALBUMIN in  the last 8760 hours. Recent Labs    05/20/18  WBC 9.1  HGB 12.6*  HCT 39*  PLT 318   Lab Results  Component Value Date   TSH 3.14 01/18/2018   Lab Results  Component Value Date   HGBA1C 6.8 (H) 09/07/2016   Lab Results  Component Value Date   CHOL 127 05/11/2017   HDL 50 05/11/2017   LDLCALC 58 05/11/2017   TRIG 98 05/11/2017    Significant Diagnostic Results in last 30 days:  No results found.  Assessment/Plan 1. Acute cystitis without hematuria  cefpodoxime 200 mg bid x 7 days Call psc with final cx result Encourage oral fluid   Family/ staff Communication: staff  Labs/tests ordered:  Culture pending

## 2019-03-31 NOTE — Telephone Encounter (Signed)
Noted  

## 2019-03-31 NOTE — Telephone Encounter (Signed)
I reached out to Collie Siad at [facility] and we have rescheduled Mr. Degrazia virtual video visit.  Appt date 7/13 at 3:30 pm.

## 2019-03-31 NOTE — Telephone Encounter (Signed)
Kenneth Stark from [facility] called and stated pt is on isolation due to [place] and she is unable to go into the room with the ipad to proceed with the Virtual Visit  CB# [phone]

## 2019-04-04 ENCOUNTER — Encounter: Payer: Self-pay | Admitting: Adult Health

## 2019-04-04 ENCOUNTER — Non-Acute Institutional Stay (SKILLED_NURSING_FACILITY): Payer: Medicare Other | Admitting: Adult Health

## 2019-04-04 DIAGNOSIS — N3000 Acute cystitis without hematuria: Secondary | ICD-10-CM

## 2019-04-04 DIAGNOSIS — R112 Nausea with vomiting, unspecified: Secondary | ICD-10-CM

## 2019-04-04 DIAGNOSIS — D649 Anemia, unspecified: Secondary | ICD-10-CM | POA: Diagnosis not present

## 2019-04-04 DIAGNOSIS — R1110 Vomiting, unspecified: Secondary | ICD-10-CM | POA: Diagnosis not present

## 2019-04-04 LAB — BASIC METABOLIC PANEL
BUN: 42 — AB (ref 4–21)
Creatinine: 1.6 — AB (ref 0.6–1.3)
Glucose: 283
Potassium: 3.3 — AB (ref 3.4–5.3)
Sodium: 139 (ref 137–147)

## 2019-04-04 LAB — CBC AND DIFFERENTIAL
HCT: 33 — AB (ref 41–53)
Hemoglobin: 10.8 — AB (ref 13.5–17.5)
Platelets: 383 (ref 150–399)
WBC: 17.6

## 2019-04-04 NOTE — Progress Notes (Signed)
Location:  Occupational psychologist of Service:  SNF (31) Provider:   Cindi Carbon, ANP [place] [phone]   [name], DO  Patient Care Team: [name], DO as PCP - General (Geriatric Medicine) Danella Sensing, MD as Consulting Physician (Dermatology) Sharyne Peach, MD as Consulting Physician (Ophthalmology) Irine Seal, MD as Attending Physician (Urology) [name], MD as Consulting Physician (Neurology)  Extended Emergency Contact Information Primary Emergency Contact: Chumney,[name] Address: [address]          [place] [address] [name] of [place] Phone: [phone] Relation: Spouse Secondary Emergency Contact: [name] [place] of [place] Mobile Phone: [phone] Relation: Daughter  Code Status: DNR Goals of care: Advanced Directive information Advanced Directives 12/24/2018  Does Patient Have a Medical Advance Directive? Yes  Type of Paramedic of [place];Living will;Out of facility DNR (pink MOST or yellow form)  Does patient want to make changes to medical advance directive? No - Patient declined  Copy of [place] in Chart? Yes - validated most recent copy scanned in chart (See row information)  Would patient like information on creating a medical advance directive? -  Pre-existing out of facility DNR order (yellow form or pink MOST form) Yellow form placed in chart (order not valid for inpatient use)     Chief Complaint  Patient presents with   Acute Visit    low grade temp not eating    HPI:  Pt is a 85 y.o. male seen today for an acute visit for low grade temp x 1 of 100 and not eating. He was started on cefpodoxime on 6/15 for a UTI after two doses of rocephin. He improved initially but throughout the week he has been eating less and sleeping more. He had one episode of vomiting and one large loose stool on 6/17 but none on  6/18.  04/04/2019  He ate only small amounts of food today and intermittently took water. Urine outputs were within normal limits. He has foley catheter due to BPH and the urine has appeared purulent without hematuria. BP and HR within normal limits. His final urine culture was reported today which showed two isolates serratia which was sensitive to rocephin and enterococcus which was not.   He has underlying dementia and is not able to provide a history. He is typically alert and oriented to self and location but not time or situation. He is slightly less alert than baseline per the nurse. He denies any symptoms but has difficulty communicating at this time.    Past Medical History:  Diagnosis Date   Abnormality of gait    Arthritis    Brain bleed ([place]) 09/01/2016   Cervical spondylosis    Degenerative joint disease (DJD) of lumbar spine    Depression    Diplopia    Dyslipidemia    Essential tremor    Foul smelling urine    Frequent falls    GERD (gastroesophageal reflux disease)    Glycosuria    Hearing difficulty    hearing aids   Hyperlipidemia    Hypertension    Hyperthyroidism    Memory loss    Osteoarthritis    Radiculopathy, lumbar region    Sixth nerve palsy    last  left brain 11/2006  02/1998 08/2002   Small vessel disease ([place])    TIA (transient ischemic attack)    Past Surgical History:  Procedure Laterality Date  APPENDECTOMY     done as a child   GALLBLADDER SURGERY  2008   KNEE ARTHROSCOPY Left    Dr. Hart Robinsons 2002   knee injections Right    Dr. Adriana Mccallum   TONSILLECTOMY     done as a child    No Known Allergies  Outpatient Encounter Medications as of 04/04/2019  Medication Sig   amLODipine (NORVASC) 10 MG tablet Take 1 tablet (10 mg total) by mouth daily.   ammonium lactate (AMLACTIN) 12 % cream Apply 1 Bottle topically at bedtime.    aspirin EC 81 MG tablet Take 81 mg by mouth daily.   Calcium  Carb-Cholecalciferol 600-800 MG-UNIT CHEW Chew 1 tablet by mouth 2 (two) times daily.    carbidopa-levodopa (SINEMET IR) 25-100 MG tablet Take 1.5 tablets by mouth 3 (three) times daily.   carboxymethylcellulose (REFRESH PLUS) 0.5 % SOLN Apply 2 drops to eye as needed (dry eyes).    clopidogrel (PLAVIX) 75 MG tablet Take 75 mg by mouth daily.   Co-Enzyme Q-10 100 MG CAPS Take 100 mg by mouth daily.   Cranberry 400 MG CAPS Take 1 capsule by mouth 3 (three) times daily.   hydrochlorothiazide (MICROZIDE) 12.5 MG capsule Take 25 mg by mouth daily.    ipratropium-albuterol (DUONEB) 0.5-2.5 (3) MG/3ML SOLN Take 3 mLs by nebulization as needed (cough).   Lutein-Zeaxanthin 25-5 MG CAPS Take 1 capsule by mouth daily.   MELATONIN-CHAMOMILE PO Take by mouth at bedtime as needed.   Memantine HCl-Donepezil HCl (NAMZARIC) 28-10 MG CP24 Take 1 capsule at bedtime by mouth.    Menthol, Topical Analgesic, (BIOFREEZE) 4 % GEL Apply 1 application topically 2 (two) times daily as needed (pain).    Misc Natural Products (GLUCOSAMINE CHOND COMPLEX/MSM PO) Take 1 tablet by mouth daily.   Multiple Vitamin (MULTI-VITAMINS) TABS Take 1 tablet daily by mouth.    Omega-3 Fatty Acids (OMEGA 3 PO) Take 1,000 mg 2 (two) times daily by mouth.    pantoprazole (PROTONIX) 40 MG tablet Take 1 tablet (40 mg total) by mouth daily.   polyethylene glycol (MIRALAX / GLYCOLAX) packet Take 17 g by mouth every other day.   senna-docusate (SENOKOT-S) 8.6-50 MG tablet Take 2 tablets 2 (two) times daily by mouth.    sertraline (ZOLOFT) 50 MG tablet Take 50 mg by mouth at bedtime.   telmisartan (MICARDIS) 80 MG tablet Take 80 mg daily by mouth.   triamcinolone (NASACORT) 55 MCG/ACT AERO nasal inhaler Place 2 sprays into the nose at bedtime.   vitamin C (ASCORBIC ACID) 500 MG tablet Take 500 mg by mouth daily.   No facility-administered encounter medications on file as of 04/04/2019.     Review of Systems  Unable to  perform ROS: Dementia    Immunization History  Administered Date(s) Administered   Influenza Inj Mdck Quad Pf 08/09/2017   Influenza, High Dose Seasonal PF 07/31/2016   Influenza,inj,Quad PF,6+ Mos 08/06/2018   Influenza-Unspecified 08/01/2010, 07/30/2012, 10/16/2012, 10/16/2013   Pneumococcal Polysaccharide-23 02/23/2004, 02/21/2013   Pneumococcal-Unspecified 03/17/2016   Td 12/02/2008   Tdap 09/01/2016   Zoster 03/28/2006   Zoster Recombinat (Shingrix) 10/29/2017, 01/24/2018   Pertinent  Health Maintenance Due  Topic Date Due   INFLUENZA VACCINE  05/17/2019   PNA vac Low Risk Adult  Completed   Fall Risk  01/04/2019 07/17/2018 08/21/2017 04/04/2017 02/26/2017  Falls in the past year? Exclusion - non ambulatory No No No No   Functional Status Survey:    Vitals:  04/04/19 1613  BP: (!) 147/63  Pulse: 69  Resp: 17  Temp: 100 F (37.8 C)  SpO2: 95%   There is no height or weight on file to calculate BMI. Physical Exam Vitals signs and nursing note reviewed.  Constitutional:      General: He is not in acute distress.    Appearance: He is not diaphoretic.     Comments: Sleep but easy to arouse  HENT:     Head: Normocephalic and atraumatic.     Nose: Nose normal. No congestion.     Mouth/Throat:     Mouth: Mucous membranes are moist.     Pharynx: No oropharyngeal exudate.     Comments: Small amount of vomit from his mouth suctioned out.  Eyes:     Conjunctiva/sclera: Conjunctivae normal.     Pupils: Pupils are equal, round, and reactive to light.  Neck:     Thyroid: No thyromegaly.     Vascular: No JVD.     Trachea: No tracheal deviation.  Cardiovascular:     Rate and Rhythm: Normal rate and regular rhythm.     Heart sounds: No murmur.  Pulmonary:     Effort: Pulmonary effort is normal. No respiratory distress.     Breath sounds: Normal breath sounds. No wheezing.  Abdominal:     General: Bowel sounds are normal. There is no distension.      Palpations: Abdomen is soft.     Tenderness: There is no abdominal tenderness.  Genitourinary:    Scrotum/Testes: Normal.     Comments: Hypospadias with purulent drainage noted. No redness or swelling Musculoskeletal:     Right lower leg: No edema.     Left lower leg: No edema.  Lymphadenopathy:     Cervical: No cervical adenopathy.  Skin:    General: Skin is warm and dry.  Neurological:     Comments: Oriented to self only. Able to f/c.  Sleepy. MAE.     Labs reviewed: Recent Labs    05/20/18 12/12/18 0600  NA 139 138  K 4.2 4.0  BUN 16 16  CREATININE 1.0 0.8   No results for input(s): AST, ALT, ALKPHOS, BILITOT, PROT, ALBUMIN in the last 8760 hours. Recent Labs    05/20/18  WBC 9.1  HGB 12.6*  HCT 39*  PLT 318   Lab Results  Component Value Date   TSH 3.14 01/18/2018   Lab Results  Component Value Date   HGBA1C 6.8 (H) 09/07/2016   Lab Results  Component Value Date   CHOL 127 05/11/2017   HDL 50 05/11/2017   LDLCALC 58 05/11/2017   TRIG 98 05/11/2017    Significant Diagnostic Results in last 30 days:  No results found.  Assessment/Plan 1. Acute cystitis without hematuria D/C cefpodoxime due to lack of coverage for isolate 2 Enterococcus and GI upset.  Cipro 400 mg IV q 12 hrs x 3 days BMP CBC stat  2. Non-intractable vomiting with nausea, unspecified vomiting type Zofran 4mg  q 8 hrs prn po or IV q 8 hrs prn Clear diet and adv as tolerated HOB elevated at all times Report loose stools if present for additional orders Monitor I and O and report if intake or output not adequate for IVF orders Encourage oral fluids as tolerated  Hold supplements  Monitor VS qshift  I discussed his care with Ms. Dumais and she is in agreement with the above plan. If he does not improvement we will request permission to allow for  a visit.   Family/ staff Communication: [name] his wife  Labs/tests ordered:  BMP CBC

## 2019-04-07 ENCOUNTER — Encounter: Payer: Self-pay | Admitting: Adult Health

## 2019-04-07 ENCOUNTER — Non-Acute Institutional Stay (SKILLED_NURSING_FACILITY): Payer: Medicare Other | Admitting: Adult Health

## 2019-04-07 DIAGNOSIS — D72825 Bandemia: Secondary | ICD-10-CM | POA: Diagnosis not present

## 2019-04-07 DIAGNOSIS — R739 Hyperglycemia, unspecified: Secondary | ICD-10-CM

## 2019-04-07 DIAGNOSIS — D649 Anemia, unspecified: Secondary | ICD-10-CM | POA: Diagnosis not present

## 2019-04-07 DIAGNOSIS — N3000 Acute cystitis without hematuria: Secondary | ICD-10-CM | POA: Diagnosis not present

## 2019-04-07 DIAGNOSIS — N179 Acute kidney failure, unspecified: Secondary | ICD-10-CM | POA: Diagnosis not present

## 2019-04-07 DIAGNOSIS — R1110 Vomiting, unspecified: Secondary | ICD-10-CM | POA: Diagnosis not present

## 2019-04-07 LAB — BASIC METABOLIC PANEL
BUN: 56 — AB (ref 4–21)
Creatinine: 1.5 — AB (ref 0.6–1.3)
Glucose: 287
Potassium: 3.3 — AB (ref 3.4–5.3)
Sodium: 136 — AB (ref 137–147)

## 2019-04-07 NOTE — Progress Notes (Signed)
Location:  Occupational psychologist of Service:  SNF (31) Provider:   Cindi Carbon, ANP [place] [phone]  [name], DO  Patient Care Team: [name], DO as PCP - General (Geriatric Medicine) Danella Sensing, MD as Consulting Physician (Dermatology) Sharyne Peach, MD as Consulting Physician (Ophthalmology) Irine Seal, MD as Attending Physician (Urology) [name], MD as Consulting Physician (Neurology)  Extended Emergency Contact Information Primary Emergency Contact: Hor,[name] Address: [address]          [place] [address] [name] of [place] Phone: [phone] Relation: Spouse Secondary Emergency Contact: [name] States of [place] Mobile Phone: [phone] Relation: Daughter  Code Status:  DNR Goals of care: Advanced Directive information Advanced Directives 12/24/2018  Does Patient Have a Medical Advance Directive? Yes  Type of Paramedic of [place];Living will;Out of facility DNR (pink MOST or yellow form)  Does patient want to make changes to medical advance directive? No - Patient declined  Copy of [place] in Chart? Yes - validated most recent copy scanned in chart (See row information)  Would patient like information on creating a medical advance directive? -  Pre-existing out of facility DNR order (yellow form or pink MOST form) Yellow form placed in chart (order not valid for inpatient use)     Chief Complaint  Patient presents with  . Acute Visit    HPI:  Pt is a 85 y.o. male seen today for an acute visit for follow up regarding UTI. He was started on Cipro on 6/22 IV due to UTI (enterococcus and Serratia both sensitive to Cipro) after a 5 day course of Vantin without improvement. He has not had any further fever. No dysuria, abd pain, frequency, hematuria, etc. Foley cath for BPH draining well with clearing in urine color,  consistency. He still has a low appetite and sleeping more than usual per the nurse but is certainly more alert than last week.    He was having n/v last week and this has resolved as well.   WBC 17.6 6/19  Fasting CBG 287 04/07/2019   BUN/CR 56/1.53 from a baseline of 16.3/0.78.  K 3.3 on 6/19, given 20 meq of Kdur with no improvement       Past Medical History:  Diagnosis Date  . Abnormality of gait   . Arthritis   . Brain bleed ([place]) 09/01/2016  . Cervical spondylosis   . Degenerative joint disease (DJD) of lumbar spine   . Depression   . Diplopia   . Dyslipidemia   . Essential tremor   . Foul smelling urine   . Frequent falls   . GERD (gastroesophageal reflux disease)   . Glycosuria   . Hearing difficulty    hearing aids  . Hyperlipidemia   . Hypertension   . Hyperthyroidism   . Memory loss   . Osteoarthritis   . Radiculopathy, lumbar region   . 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 Small vessel disease ([place])   . TIA (transient ischemic attack)    Past Surgical History:  Procedure Laterality Date  . APPENDECTOMY     done as a child  . GALLBLADDER SURGERY  2008  . KNEE ARTHROSCOPY Left    Dr. Hart Robinsons 2002  . knee injections Right    Dr. Adriana Mccallum  . TONSILLECTOMY     done as a child  No Known Allergies  Outpatient Encounter Medications as of 04/07/2019  Medication Sig  . ondansetron (ZOFRAN) 4 MG tablet Take 4 mg by mouth every 8 (eight) hours as needed for nausea or vomiting.  Marland Kitchen amLODipine (NORVASC) 10 MG tablet Take 1 tablet (10 mg total) by mouth daily.  Marland Kitchen ammonium lactate (AMLACTIN) 12 % cream Apply 1 Bottle topically at bedtime.   Marland Kitchen aspirin EC 81 MG tablet Take 81 mg by mouth daily.  . Calcium Carb-Cholecalciferol 600-800 MG-UNIT CHEW Chew 1 tablet by mouth 2 (two) times daily.   . carbidopa-levodopa (SINEMET IR) 25-100 MG tablet Take 1.5 tablets by mouth 3 (three) times daily.  . carboxymethylcellulose  (REFRESH PLUS) 0.5 % SOLN Apply 2 drops to eye as needed (dry eyes).   . clopidogrel (PLAVIX) 75 MG tablet Take 75 mg by mouth daily.  Marland Kitchen Co-Enzyme Q-10 100 MG CAPS Take 100 mg by mouth daily.  . Cranberry 400 MG CAPS Take 1 capsule by mouth 3 (three) times daily.  . hydrochlorothiazide (MICROZIDE) 12.5 MG capsule Take 25 mg by mouth daily.   Marland Kitchen ipratropium-albuterol (DUONEB) 0.5-2.5 (3) MG/3ML SOLN Take 3 mLs by nebulization as needed (cough).  . Lutein-Zeaxanthin 25-5 MG CAPS Take 1 capsule by mouth daily.  Marland Kitchen MELATONIN-CHAMOMILE PO Take by mouth at bedtime as needed.  . Memantine HCl-Donepezil HCl (NAMZARIC) 28-10 MG CP24 Take 1 capsule at bedtime by mouth.   . Menthol, Topical Analgesic, (BIOFREEZE) 4 % GEL Apply 1 application topically 2 (two) times daily as needed (pain).   . Misc Natural Products (GLUCOSAMINE CHOND COMPLEX/MSM PO) Take 1 tablet by mouth daily.  . Multiple Vitamin (MULTI-VITAMINS) TABS Take 1 tablet daily by mouth.   . Omega-3 Fatty Acids (OMEGA 3 PO) Take 1,000 mg 2 (two) times daily by mouth.   . pantoprazole (PROTONIX) 40 MG tablet Take 1 tablet (40 mg total) by mouth daily.  . polyethylene glycol (MIRALAX / GLYCOLAX) packet Take 17 g by mouth every other day.  . senna-docusate (SENOKOT-S) 8.6-50 MG tablet Take 2 tablets 2 (two) times daily by mouth.   . sertraline (ZOLOFT) 50 MG tablet Take 50 mg by mouth at bedtime.  Marland Kitchen telmisartan (MICARDIS) 80 MG tablet Take 80 mg daily by mouth.  . triamcinolone (NASACORT) 55 MCG/ACT AERO nasal inhaler Place 2 sprays into the nose at bedtime.  . vitamin C (ASCORBIC ACID) 500 MG tablet Take 500 mg by mouth daily.   No facility-administered encounter medications on file as of 04/07/2019.     Review of Systems  Constitutional: Positive for activity change, appetite change and fatigue. Negative for chills, diaphoresis, fever and unexpected weight change.  Respiratory: Positive for cough (chronic). Negative for shortness of breath,  wheezing and stridor.   Cardiovascular: Negative for chest pain, palpitations and leg swelling.  Gastrointestinal: Negative for abdominal distention, abdominal pain, constipation and diarrhea.  Genitourinary: Negative for decreased urine volume, difficulty urinating, discharge, dysuria, flank pain, frequency, penile pain, penile swelling, scrotal swelling, testicular pain and urgency.  Musculoskeletal: Positive for gait problem. Negative for arthralgias, back pain, joint swelling and myalgias.  Neurological: Negative for dizziness, seizures, syncope, facial asymmetry, speech difficulty, weakness and headaches.  Hematological: Negative for adenopathy. Does not bruise/bleed easily.  Psychiatric/Behavioral: Positive for confusion. Negative for agitation and behavioral problems.    Immunization History  Administered Date(s) Administered  . Influenza Inj Mdck Quad Pf 08/09/2017  . Influenza, High Dose Seasonal PF 07/31/2016  . Influenza,inj,Quad PF,6+ Mos 08/06/2018  . Influenza-Unspecified 08/01/2010,  07/30/2012, 10/16/2012, 10/16/2013  . Pneumococcal Polysaccharide-23 02/23/2004, 02/21/2013  . Pneumococcal-Unspecified 03/17/2016  . Td 12/02/2008  . Tdap 09/01/2016  . Zoster 03/28/2006  . Zoster Recombinat (Shingrix) 10/29/2017, 01/24/2018   Pertinent  Health Maintenance Due  Topic Date Due  . INFLUENZA VACCINE  05/17/2019  . PNA vac Low Risk Adult  Completed   Fall Risk  01/04/2019 07/17/2018 08/21/2017 04/04/2017 02/26/2017  Falls in the past year? Exclusion - non ambulatory No No No No   Functional Status Survey:    Vitals:   04/07/19 1404  BP: 139/67  Pulse: 77  Resp: 19  Temp: (!) 97.5 F (36.4 C)  SpO2: 96%   There is no height or weight on file to calculate BMI. Physical Exam Vitals signs and nursing note reviewed.  Constitutional:      General: He is not in acute distress.    Appearance: He is not diaphoretic.  HENT:     Head: Normocephalic and atraumatic.     Nose:  No congestion or rhinorrhea.     Mouth/Throat:     Mouth: Mucous membranes are dry.     Pharynx: No oropharyngeal exudate.  Eyes:     General:        Right eye: No discharge.        Left eye: No discharge.     Conjunctiva/sclera: Conjunctivae normal.     Pupils: Pupils are equal, round, and reactive to light.  Neck:     Thyroid: No thyromegaly.     Vascular: No JVD.     Trachea: No tracheal deviation.  Cardiovascular:     Rate and Rhythm: Normal rate and regular rhythm.     Heart sounds: No murmur.  Pulmonary:     Effort: Pulmonary effort is normal. No respiratory distress.     Breath sounds: Normal breath sounds. No wheezing.  Abdominal:     General: Bowel sounds are normal. There is no distension.     Palpations: Abdomen is soft.     Tenderness: There is no abdominal tenderness. There is no right CVA tenderness, left CVA tenderness, guarding or rebound.  Genitourinary:    Comments: Foley cath with yellow urine and sediment Lymphadenopathy:     Cervical: No cervical adenopathy.  Skin:    General: Skin is warm and dry.  Neurological:     General: No focal deficit present.     Mental Status: He is alert.     Comments: Eyes closed but easily aroused with verbal stim. Able to f/c. MAE  Psychiatric:        Mood and Affect: Mood normal.     Labs reviewed: Recent Labs    12/12/18 0600 04/04/19 04/07/19  NA 138 139 136*  K 4.0 3.3* 3.3*  BUN 16 42* 56*  CREATININE 0.8 1.6* 1.5*   No results for input(s): AST, ALT, ALKPHOS, BILITOT, PROT, ALBUMIN in the last 8760 hours. Recent Labs    05/20/18 04/04/19  WBC 9.1 17.6  HGB 12.6* 10.8*  HCT 39* 33*  PLT 318 383   Lab Results  Component Value Date   TSH 3.14 01/18/2018   Lab Results  Component Value Date   HGBA1C 6.8 (H) 09/07/2016   Lab Results  Component Value Date   CHOL 127 05/11/2017   HDL 50 05/11/2017   LDLCALC 58 05/11/2017   TRIG 98 05/11/2017    Significant Diagnostic Results in last 30 days:  No  results found.  Assessment/Plan 1. Acute cystitis without hematuria  Improvement in mental status   Afebrile Change to oral Cipro 250 mg bid x 4 days Continue to encourage oral fluid OK to resume supplements  2. Acute renal failure, unspecified acute renal failure type (HCC) GFR 29 ml/min NS 1 liter at 100 cc/hr then recheck BMP If no improvement consider renal ultrasound   3. Hyperglycemia Fasting cbg 287.  Continue to monitor.  Would not aggressively treat given his dementia and variable intake   4. Bandemia WBC 17.6 on 6/19 prior to initiating Cipro No fever Will recheck with next labs     Family/ staff Communication: staff  Labs/tests ordered:  CBC BMP

## 2019-04-08 DIAGNOSIS — D649 Anemia, unspecified: Secondary | ICD-10-CM | POA: Diagnosis not present

## 2019-04-08 DIAGNOSIS — N390 Urinary tract infection, site not specified: Secondary | ICD-10-CM | POA: Diagnosis not present

## 2019-04-08 DIAGNOSIS — Z79899 Other long term (current) drug therapy: Secondary | ICD-10-CM | POA: Diagnosis not present

## 2019-04-08 LAB — CBC AND DIFFERENTIAL
HCT: 32 — AB (ref 41–53)
Hemoglobin: 10.8 — AB (ref 13.5–17.5)
Platelets: 460 — AB (ref 150–399)
WBC: 13

## 2019-04-08 LAB — BASIC METABOLIC PANEL
BUN: 50 — AB (ref 4–21)
Creatinine: 1.5 — AB (ref 0.6–1.3)
Glucose: 286
Potassium: 3.9 (ref 3.4–5.3)
Sodium: 143 (ref 137–147)

## 2019-04-09 DIAGNOSIS — R05 Cough: Secondary | ICD-10-CM | POA: Diagnosis not present

## 2019-04-09 DIAGNOSIS — R509 Fever, unspecified: Secondary | ICD-10-CM | POA: Diagnosis not present

## 2019-04-10 ENCOUNTER — Non-Acute Institutional Stay (SKILLED_NURSING_FACILITY): Payer: Medicare Other | Admitting: Adult Health

## 2019-04-10 ENCOUNTER — Encounter: Payer: Self-pay | Admitting: Adult Health

## 2019-04-10 DIAGNOSIS — N179 Acute kidney failure, unspecified: Secondary | ICD-10-CM | POA: Diagnosis not present

## 2019-04-10 DIAGNOSIS — N3000 Acute cystitis without hematuria: Secondary | ICD-10-CM

## 2019-04-10 DIAGNOSIS — J189 Pneumonia, unspecified organism: Secondary | ICD-10-CM

## 2019-04-10 NOTE — ACP (Advance Care Planning) (Signed)
I spoke with his wife and POA, Kenneth Stark regarding his care. He has an underling hx of vascular dementia and parkinsonism. He is not ambulatory and needs assistance with all ADls.  His health is generally declining and his wife does not wish to prolong his life. He has a DNR in place and does not want a feeding tube. We agreed to try antibiotics and IVF on a trial basis but if no improvement we would not escalate care or send him to the hospital. Her wishes are that he gets to visit with his children before he passes away. I assured her that if he does not improve in the next 48 hrs or worsens [facility] would [facility]. She expressed appreciated and understanding. I spent 20 min counseling and discussion regarding this care.

## 2019-04-10 NOTE — Progress Notes (Signed)
Location:  Occupational psychologist of Service:  SNF (31) Provider:   Cindi Carbon, ANP [place] [phone]  [name], DO  Patient Care Team: [name], DO as PCP - General (Geriatric Medicine) Danella Sensing, MD as Consulting Physician (Dermatology) Sharyne Peach, MD as Consulting Physician (Ophthalmology) Irine Seal, MD as Attending Physician (Urology) [name], MD as Consulting Physician (Neurology)  Extended Emergency Contact Information Primary Emergency Contact: Pettis,[name] Address: [address]          [place] [address] [name] of [place] Phone: [phone] Relation: Spouse Secondary Emergency Contact: [name] States of [place] Mobile Phone: [phone] Relation: Daughter  Code Status:  DNR Goals of care: Advanced Directive information Advanced Directives 12/24/2018  Does Patient Have a Medical Advance Directive? Yes  Type of Paramedic of [place];Living will;Out of facility DNR (pink MOST or yellow form)  Does patient want to make changes to medical advance directive? No - Patient declined  Copy of [place] in Chart? Yes - validated most recent copy scanned in chart (See row information)  Would patient like information on creating a medical advance directive? -  Pre-existing out of facility DNR order (yellow form or pink MOST form) Yellow form placed in chart (order not valid for inpatient use)     Chief Complaint  Patient presents with  . Acute Visit    UTI, PNA    HPI:  Pt is a 85 y.o. male seen today for an acute visit for UTI and pna. He was treated for a UTI (serratia and enterococcus)  with cipro and completed day 7/7 04/10/2019.  He was not improving and experienced a temp of 100.4 and cough on 6/24 and a CXR was ordered which showed bilateral pna.  For the past week he has experienced lethargy and decreased appetite. He  has received 2 liters of IVF due to ARF BUN/Cr 50/1.5 on 6/23, which reflected no improvement with hydration. His foley catheter was changed due to leakage on 6/24 and immediately drained 1400 cc. He has not had any abd, hematuria, back pain, etc. Initially he had n/v but this resolved.  He has a cough with yellow sputum. No sob or cp. Sats are WNL on RA. He is coughing with thin liquids but it taking minimal amts in. He is sleeping most of the day per the nurse.    Past Medical History:  Diagnosis Date  . Abnormality of gait   . Arthritis   . Brain bleed ([place]) 09/01/2016  . Cervical spondylosis   . Degenerative joint disease (DJD) of lumbar spine   . Depression   . Diplopia   . Dyslipidemia   . Essential tremor   . Foul smelling urine   . Frequent falls   . GERD (gastroesophageal reflux disease)   . Glycosuria   . Hearing difficulty    hearing aids  . Hyperlipidemia   . Hypertension   . Hyperthyroidism   . Memory loss   . Osteoarthritis   . Radiculopathy, lumbar region   . 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 Small vessel disease ([place])   . TIA (transient ischemic attack)    Past Surgical History:  Procedure Laterality Date  . APPENDECTOMY     done as a child  . GALLBLADDER SURGERY  2008  . KNEE ARTHROSCOPY Left    Dr. Hart Robinsons 2002  .  knee injections Right    Dr. Adriana Mccallum  . TONSILLECTOMY     done as a child    No Known Allergies  Outpatient Encounter Medications as of 04/10/2019  Medication Sig  . amoxicillin-clavulanate (AUGMENTIN) 500-125 MG tablet Take 1 tablet by mouth 2 (two) times a day. X 7 days  . guaiFENesin-dextromethorphan (ROBITUSSIN DM) 100-10 MG/5ML syrup Take 10 mLs by mouth every 6 (six) hours. X 72 hrs  . saccharomyces boulardii (FLORASTOR) 250 MG capsule Take 250 mg by mouth 2 (two) times daily. X 7 days  . amLODipine (NORVASC) 10 MG tablet Take 1 tablet (10 mg total) by mouth daily.  Marland Kitchen ammonium lactate  (AMLACTIN) 12 % cream Apply 1 Bottle topically at bedtime.   Marland Kitchen aspirin EC 81 MG tablet Take 81 mg by mouth daily.  . Calcium Carb-Cholecalciferol 600-800 MG-UNIT CHEW Chew 1 tablet by mouth 2 (two) times daily.   . carbidopa-levodopa (SINEMET IR) 25-100 MG tablet Take 1.5 tablets by mouth 3 (three) times daily.  . carboxymethylcellulose (REFRESH PLUS) 0.5 % SOLN Apply 2 drops to eye as needed (dry eyes).   . clopidogrel (PLAVIX) 75 MG tablet Take 75 mg by mouth daily.  Marland Kitchen Co-Enzyme Q-10 100 MG CAPS Take 100 mg by mouth daily.  . Cranberry 400 MG CAPS Take 1 capsule by mouth 3 (three) times daily.  . hydrochlorothiazide (MICROZIDE) 12.5 MG capsule Take 25 mg by mouth daily.   Marland Kitchen ipratropium-albuterol (DUONEB) 0.5-2.5 (3) MG/3ML SOLN Take 3 mLs by nebulization every 6 (six) hours.   . Lutein-Zeaxanthin 25-5 MG CAPS Take 1 capsule by mouth daily.  Marland Kitchen MELATONIN-CHAMOMILE PO Take by mouth at bedtime as needed.  . Memantine HCl-Donepezil HCl (NAMZARIC) 28-10 MG CP24 Take 1 capsule at bedtime by mouth.   . Menthol, Topical Analgesic, (BIOFREEZE) 4 % GEL Apply 1 application topically 2 (two) times daily as needed (pain).   . Misc Natural Products (GLUCOSAMINE CHOND COMPLEX/MSM PO) Take 1 tablet by mouth daily.  . Multiple Vitamin (MULTI-VITAMINS) TABS Take 1 tablet daily by mouth.   . Omega-3 Fatty Acids (OMEGA 3 PO) Take 1,000 mg 2 (two) times daily by mouth.   . ondansetron (ZOFRAN) 4 MG tablet Take 4 mg by mouth every 8 (eight) hours as needed for nausea or vomiting.  . pantoprazole (PROTONIX) 40 MG tablet Take 1 tablet (40 mg total) by mouth daily.  . polyethylene glycol (MIRALAX / GLYCOLAX) packet Take 17 g by mouth every other day.  . senna-docusate (SENOKOT-S) 8.6-50 MG tablet Take 2 tablets 2 (two) times daily by mouth.   . sertraline (ZOLOFT) 50 MG tablet Take 50 mg by mouth at bedtime.  Marland Kitchen telmisartan (MICARDIS) 80 MG tablet Take 80 mg daily by mouth.  . triamcinolone (NASACORT) 55 MCG/ACT AERO  nasal inhaler Place 2 sprays into the nose at bedtime.  . vitamin C (ASCORBIC ACID) 500 MG tablet Take 500 mg by mouth daily.   No facility-administered encounter medications on file as of 04/10/2019.     Review of Systems  Unable to perform ROS: Dementia    Immunization History  Administered Date(s) Administered  . Influenza Inj Mdck Quad Pf 08/09/2017  . Influenza, High Dose Seasonal PF 07/31/2016  . Influenza,inj,Quad PF,6+ Mos 08/06/2018  . Influenza-Unspecified 08/01/2010, 07/30/2012, 10/16/2012, 10/16/2013  . Pneumococcal Polysaccharide-23 02/23/2004, 02/21/2013  . Pneumococcal-Unspecified 03/17/2016  . Td 12/02/2008  . Tdap 09/01/2016  . Zoster 03/28/2006  . Zoster Recombinat (Shingrix) 10/29/2017, 01/24/2018   Pertinent  Health  Maintenance Due  Topic Date Due  . INFLUENZA VACCINE  05/17/2019  . PNA vac Low Risk Adult  Completed   Fall Risk  01/04/2019 07/17/2018 08/21/2017 04/04/2017 02/26/2017  Falls in the past year? Exclusion - non ambulatory No No No No   Functional Status Survey:    Vitals:   04/10/19 1113  BP: 132/60  Pulse: 79  Resp: 16  Temp: 97.8 F (36.6 C)  SpO2: 94%   There is no height or weight on file to calculate BMI. Physical Exam Constitutional:      General: He is not in acute distress.    Appearance: He is not diaphoretic.  HENT:     Head: Normocephalic and atraumatic.     Nose: Nose normal.     Mouth/Throat:     Mouth: Mucous membranes are moist.     Pharynx: Oropharyngeal exudate present.  Eyes:     General:        Right eye: No discharge.        Left eye: No discharge.     Conjunctiva/sclera: Conjunctivae normal.     Pupils: Pupils are equal, round, and reactive to light.  Neck:     Thyroid: No thyromegaly.     Vascular: No JVD.     Trachea: No tracheal deviation.  Cardiovascular:     Rate and Rhythm: Normal rate and regular rhythm.     Heart sounds: No murmur.  Pulmonary:     Effort: Pulmonary effort is normal. No  respiratory distress.     Breath sounds: Rhonchi present. No wheezing.  Abdominal:     General: Bowel sounds are normal. There is no distension.     Palpations: Abdomen is soft.     Tenderness: There is no abdominal tenderness. There is no right CVA tenderness or left CVA tenderness.  Genitourinary:    Comments: Catheter with yellow urine and sediment. Musculoskeletal:     Right lower leg: No edema.     Left lower leg: No edema.     Comments: Rigidity noted to BUE with hx of parkinsonism  Lymphadenopathy:     Cervical: No cervical adenopathy.  Skin:    General: Skin is warm and dry.  Neurological:     Comments: Lethargic but arouses to verbal stim. Not able to f/c. No obvious f/c.       Labs reviewed: Recent Labs    04/04/19 04/07/19 04/08/19  NA 139 136* 143  K 3.3* 3.3* 3.9  BUN 42* 56* 50*  CREATININE 1.6* 1.5* 1.5*   No results for input(s): AST, ALT, ALKPHOS, BILITOT, PROT, ALBUMIN in the last 8760 hours. Recent Labs    05/20/18 04/04/19 04/08/19  WBC 9.1 17.6 13.0  HGB 12.6* 10.8* 10.8*  HCT 39* 33* 32*  PLT 318 383 460*   Lab Results  Component Value Date   TSH 3.14 01/18/2018   Lab Results  Component Value Date   HGBA1C 6.8 (H) 09/07/2016   Lab Results  Component Value Date   CHOL 127 05/11/2017   HDL 50 05/11/2017   LDLCALC 58 05/11/2017   TRIG 98 05/11/2017    Significant Diagnostic Results in last 30 days:  No results found.  Assessment/Plan 1. HCAP (healthcare-associated pneumonia) Augmentin 500 mg bid x 7 days due to renal function Continue duonebs and robitussin DM for cough Clear diet adv as tolerated Consider speech therapy if he becomes more alert If no improvement would not escalate care, see below  2. Acute renal failure,  unspecified acute renal failure type ([place]) Not eating or drinking to maintain hydration NS at 80 cc/hr x 1 liter   3. Acute cystitis without hematuria Complete Cipro 7/7 04/10/2019  4. Advanced care planning.   I spoke with his wife and POA, Dot Shepherd regarding his care. He has an underling hx of vascular dementia and parkinsonism. He is not ambulatory and needs assistance with all ADls.  His health is generally declining and his wife does not wish to prolong his life. He has a DNR in place and does not want a feeding tube. We agreed to try antibiotics and IVF on a trial basis but if no improvement we would not escalate care or send him to the hospital. Her wishes are that he gets to visit with his children before he passes away. I assured her that if he does not improve in the next 48 hrs or worsens [facility] for a [facility]. She expressed appreciated and understanding. I spent 20 min counseling and discussion regarding this care.    Family/ staff Communication: [name] his wife  Labs/tests ordered:  BMP in am

## 2019-04-11 DIAGNOSIS — D649 Anemia, unspecified: Secondary | ICD-10-CM | POA: Diagnosis not present

## 2019-04-11 DIAGNOSIS — Z79899 Other long term (current) drug therapy: Secondary | ICD-10-CM | POA: Diagnosis not present

## 2019-04-11 LAB — BASIC METABOLIC PANEL
BUN: 17 (ref 4–21)
Creatinine: 0.8 (ref 0.6–1.3)
Glucose: 316
Potassium: 3.3 — AB (ref 3.4–5.3)
Sodium: 141 (ref 137–147)

## 2019-04-14 DIAGNOSIS — B351 Tinea unguium: Secondary | ICD-10-CM | POA: Diagnosis not present

## 2019-04-21 ENCOUNTER — Non-Acute Institutional Stay (SKILLED_NURSING_FACILITY): Payer: Medicare Other | Admitting: Adult Health

## 2019-04-21 DIAGNOSIS — E119 Type 2 diabetes mellitus without complications: Secondary | ICD-10-CM | POA: Diagnosis not present

## 2019-04-21 DIAGNOSIS — I10 Essential (primary) hypertension: Secondary | ICD-10-CM

## 2019-04-21 DIAGNOSIS — G309 Alzheimer's disease, unspecified: Secondary | ICD-10-CM | POA: Diagnosis not present

## 2019-04-21 DIAGNOSIS — F0280 Dementia in other diseases classified elsewhere without behavioral disturbance: Secondary | ICD-10-CM

## 2019-04-21 DIAGNOSIS — B3749 Other urogenital candidiasis: Secondary | ICD-10-CM | POA: Diagnosis not present

## 2019-04-21 DIAGNOSIS — F0150 Vascular dementia without behavioral disturbance: Secondary | ICD-10-CM

## 2019-04-21 DIAGNOSIS — G20 Parkinson's disease: Secondary | ICD-10-CM | POA: Diagnosis not present

## 2019-04-21 NOTE — Progress Notes (Signed)
Location:  Occupational psychologist of Service:  SNF (31) Provider:   Cindi Carbon, ANP Kenneth Stark [phone]   [name], DO  Patient Care Team: [name], DO as PCP - General (Geriatric Medicine) Danella Sensing, MD as Consulting Physician (Dermatology) Sharyne Peach, MD as Consulting Physician (Ophthalmology) Irine Seal, MD as Attending Physician (Urology) [name], MD as Consulting Physician (Neurology)  Extended Emergency Contact Information Primary Emergency Contact: Deloach,[name] Address: [address]          [place] [address] [name] of [place] Phone: [phone] Relation: Spouse Secondary Emergency Contact: [name] States of [place] Mobile Phone: [phone] Relation: Daughter  Code Status:  DNR Goals of care: Advanced Directive information Advanced Directives 12/24/2018  Does Patient Have a Medical Advance Directive? Yes  Type of Paramedic of [place];Living will;Out of facility DNR (pink MOST or yellow form)  Does patient want to make changes to medical advance directive? No - Patient declined  Copy of [place] in Chart? Yes - validated most recent copy scanned in chart (See row information)  Would patient like information on creating a medical advance directive? -  Pre-existing out of facility DNR order (yellow form or pink MOST form) Yellow form placed in chart (order not valid for inpatient use)     Chief Complaint  Patient presents with  . Medical Management of Chronic Issues    HPI:  Pt is a 85 y.o. male seen today for medical management of chronic diseases.  He was treated for a UTI with ARF in June with antibiotics and IVF. His wife had decided at that time if he did not improve we would not escalate care. He survived this illness and is almost back to baseline. The nurses have noted that he is less talkative and weaker  than before but overall improved. He was having trouble swallowing his pills and they were crushing them but he no longer needs this to be done. Due to some issues with swallowing with the large amount of vitamins he was taking these have been discontinued. During his acute illness losartan and hctz were held due to ARF. His bp has been controlled  120/52 with norvasc. His BUN/CR have improved back to baseline 17/0.8 He has had periods of elevated blood sugars fasting for the past year. During his acute illness this was worse, in the 300s. Now that his status has improved the nurses did blood sugars twice daily and they are as follows ranging 146-177 in the morning and 175-190 before supper. The nurse is reporting redness to his scrotum.   Past Medical History:  Diagnosis Date  . Abnormality of gait   . Arthritis   . Brain bleed ([place]) 09/01/2016  . Cervical spondylosis   . Degenerative joint disease (DJD) of lumbar spine   . Depression   . Diplopia   . Dyslipidemia   . Essential tremor   . Foul smelling urine   . Frequent falls   . GERD (gastroesophageal reflux disease)   . Glycosuria   . Hearing difficulty    hearing aids  . Hyperlipidemia   . Hypertension   . Hyperthyroidism   . Memory loss   . Osteoarthritis   . Radiculopathy, lumbar region   . 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 Small vessel disease ([place])   . TIA (transient ischemic attack)  Past Surgical History:  Procedure Laterality Date  . APPENDECTOMY     done as a child  . GALLBLADDER SURGERY  2008  . KNEE ARTHROSCOPY Left    Dr. Hart Robinsons 2002  . knee injections Right    Dr. Adriana Mccallum  . TONSILLECTOMY     done as a child    No Known Allergies  Outpatient Encounter Medications as of 04/21/2019  Medication Sig  . amLODipine (NORVASC) 10 MG tablet Take 1 tablet (10 mg total) by mouth daily.  Marland Kitchen ammonium lactate (AMLACTIN) 12 % cream Apply 1 Bottle topically at bedtime.   Marland Kitchen  aspirin EC 81 MG tablet Take 81 mg by mouth daily.  . carbidopa-levodopa (SINEMET IR) 25-100 MG tablet Take 1.5 tablets by mouth 3 (three) times daily.  . carboxymethylcellulose (REFRESH PLUS) 0.5 % SOLN Apply 2 drops to eye as needed (dry eyes).   . clopidogrel (PLAVIX) 75 MG tablet Take 75 mg by mouth daily.  Marland Kitchen ipratropium-albuterol (DUONEB) 0.5-2.5 (3) MG/3ML SOLN Take 3 mLs by nebulization every 8 (eight) hours as needed (cough).   . MELATONIN-CHAMOMILE PO Take by mouth at bedtime as needed.  . Memantine HCl-Donepezil HCl (NAMZARIC) 28-10 MG CP24 Take 1 capsule at bedtime by mouth.   . Menthol, Topical Analgesic, (BIOFREEZE) 4 % GEL Apply 1 application topically 2 (two) times daily as needed (pain).   . ondansetron (ZOFRAN) 4 MG tablet Take 4 mg by mouth every 8 (eight) hours as needed for nausea or vomiting.  . pantoprazole (PROTONIX) 40 MG tablet Take 1 tablet (40 mg total) by mouth daily.  . polyethylene glycol (MIRALAX / GLYCOLAX) packet Take 17 g by mouth every other day.  . senna-docusate (SENOKOT-S) 8.6-50 MG tablet Take 2 tablets 2 (two) times daily by mouth.   . sertraline (ZOLOFT) 50 MG tablet Take 50 mg by mouth at bedtime.  . triamcinolone (NASACORT) 55 MCG/ACT AERO nasal inhaler Place 2 sprays into the nose at bedtime.  . [DISCONTINUED] amoxicillin-clavulanate (AUGMENTIN) 500-125 MG tablet Take 1 tablet by mouth 2 (two) times a day. X 7 days  . [DISCONTINUED] Calcium Carb-Cholecalciferol 600-800 MG-UNIT CHEW Chew 1 tablet by mouth 2 (two) times daily.   . [DISCONTINUED] Co-Enzyme Q-10 100 MG CAPS Take 100 mg by mouth daily.  . [DISCONTINUED] Cranberry 400 MG CAPS Take 1 capsule by mouth 3 (three) times daily.  . [DISCONTINUED] guaiFENesin-dextromethorphan (ROBITUSSIN DM) 100-10 MG/5ML syrup Take 10 mLs by mouth every 6 (six) hours. X 72 hrs  . [DISCONTINUED] hydrochlorothiazide (MICROZIDE) 12.5 MG capsule Take 25 mg by mouth daily.   . [DISCONTINUED] Lutein-Zeaxanthin 25-5 MG  CAPS Take 1 capsule by mouth daily.  . [DISCONTINUED] Misc Natural Products (GLUCOSAMINE CHOND COMPLEX/MSM PO) Take 1 tablet by mouth daily.  . [DISCONTINUED] Multiple Vitamin (MULTI-VITAMINS) TABS Take 1 tablet daily by mouth.   . [DISCONTINUED] Omega-3 Fatty Acids (OMEGA 3 PO) Take 1,000 mg 2 (two) times daily by mouth.   . [DISCONTINUED] saccharomyces boulardii (FLORASTOR) 250 MG capsule Take 250 mg by mouth 2 (two) times daily. X 7 days  . [DISCONTINUED] telmisartan (MICARDIS) 80 MG tablet Take 80 mg daily by mouth.  . [DISCONTINUED] vitamin C (ASCORBIC ACID) 500 MG tablet Take 500 mg by mouth daily.   No facility-administered encounter medications on file as of 04/21/2019.     Review of Systems  Unable to perform ROS: Dementia    Immunization History  Administered Date(s) Administered  . Influenza Inj Mdck Quad Pf 08/09/2017  .  Influenza, High Dose Seasonal PF 07/31/2016  . Influenza,inj,Quad PF,6+ Mos 08/06/2018  . Influenza-Unspecified 08/01/2010, 07/30/2012, 10/16/2012, 10/16/2013  . Pneumococcal Polysaccharide-23 02/23/2004, 02/21/2013  . Pneumococcal-Unspecified 03/17/2016  . Td 12/02/2008  . Tdap 09/01/2016  . Zoster 03/28/2006  . Zoster Recombinat (Shingrix) 10/29/2017, 01/24/2018   Pertinent  Health Maintenance Due  Topic Date Due  . INFLUENZA VACCINE  05/17/2019  . PNA vac Low Risk Adult  Completed   Fall Risk  01/04/2019 07/17/2018 08/21/2017 04/04/2017 02/26/2017  Falls in the past year? Exclusion - non ambulatory No No No No   Functional Status Survey:    Vitals:   04/21/19 1428  Weight: 160 lb 3.2 oz (72.7 kg)   Body mass index is 24.36 kg/m. Physical Exam Vitals signs and nursing note reviewed.  Constitutional:      General: He is not in acute distress.    Appearance: He is not diaphoretic.     Comments: Asleep difficult to arouse  HENT:     Head: Normocephalic and atraumatic.     Mouth/Throat:     Mouth: Mucous membranes are moist.     Pharynx:  Oropharynx is clear. No oropharyngeal exudate.  Neck:     Thyroid: No thyromegaly.     Vascular: No JVD.     Trachea: No tracheal deviation.  Cardiovascular:     Rate and Rhythm: Normal rate and regular rhythm.     Heart sounds: No murmur.  Pulmonary:     Effort: Pulmonary effort is normal. No respiratory distress.     Breath sounds: Normal breath sounds. No wheezing.  Abdominal:     General: Bowel sounds are normal. There is no distension.     Palpations: Abdomen is soft.     Tenderness: There is no abdominal tenderness.  Musculoskeletal:        General: No swelling, tenderness, deformity or signs of injury.     Right lower leg: No edema.     Left lower leg: No edema.  Lymphadenopathy:     Cervical: No cervical adenopathy.  Skin:    General: Skin is warm and dry.     Findings: Erythema (scrotal area ) present.  Neurological:     General: No focal deficit present.     Cranial Nerves: No cranial nerve deficit.     Comments: Able to f/c but sleepy     Labs reviewed: Recent Labs    04/07/19 04/08/19 04/11/19  NA 136* 143 141  K 3.3* 3.9 3.3*  BUN 56* 50* 17  CREATININE 1.5* 1.5* 0.8   No results for input(s): AST, ALT, ALKPHOS, BILITOT, PROT, ALBUMIN in the last 8760 hours. Recent Labs    05/20/18 04/04/19 04/08/19  WBC 9.1 17.6 13.0  HGB 12.6* 10.8* 10.8*  HCT 39* 33* 32*  PLT 318 383 460*   Lab Results  Component Value Date   TSH 3.14 01/18/2018   Lab Results  Component Value Date   HGBA1C 6.8 (H) 09/07/2016   Lab Results  Component Value Date   CHOL 127 05/11/2017   HDL 50 05/11/2017   LDLCALC 58 05/11/2017   TRIG 98 05/11/2017    Significant Diagnostic Results in last 30 days:  No results found.  Assessment/Plan 1. Controlled type 2 diabetes mellitus without complication, without long-term current use of insulin (HCC) Consistent elevation >126 in fasting cbg noted over time, worse now after an acute illness. Given his age, progressive dementia, and  intermittent poor appetite aggressive blood sugar control is not  warranted. Continue to monitor.   2. Essential hypertension Controlled off hctz and losartan. Continue Norvasc.   3. Mixed Alzheimer's and vascular dementia ([place]) Seems to be worsening with less verbalization  Continue Namzaric   4. Parkinsonism, unspecified Parkinsonism type (HCC) Continue Sinemet 1.5 tab tid   5. Candidiasis of scrotum Nystatin cream to groin bid x 10 days   Family/ staff Communication: resident   Labs/tests ordered:  No new, needs A1C in the future

## 2019-04-23 ENCOUNTER — Encounter: Payer: Self-pay | Admitting: Adult Health

## 2019-04-28 ENCOUNTER — Other Ambulatory Visit: Payer: Self-pay

## 2019-04-28 ENCOUNTER — Encounter: Payer: Self-pay | Admitting: Neurology

## 2019-04-28 ENCOUNTER — Ambulatory Visit (INDEPENDENT_AMBULATORY_CARE_PROVIDER_SITE_OTHER): Payer: Medicare Other | Admitting: Neurology

## 2019-04-28 DIAGNOSIS — F0280 Dementia in other diseases classified elsewhere without behavioral disturbance: Secondary | ICD-10-CM | POA: Diagnosis not present

## 2019-04-28 DIAGNOSIS — G20A1 Parkinson's disease without dyskinesia, without mention of fluctuations: Secondary | ICD-10-CM

## 2019-04-28 DIAGNOSIS — F0390 Unspecified dementia without behavioral disturbance: Secondary | ICD-10-CM | POA: Insufficient documentation

## 2019-04-28 DIAGNOSIS — G20 Parkinson's disease: Secondary | ICD-10-CM

## 2019-04-28 DIAGNOSIS — G20C Parkinsonism, unspecified: Secondary | ICD-10-CM

## 2019-04-28 HISTORY — DX: Unspecified dementia, unspecified severity, without behavioral disturbance, psychotic disturbance, mood disturbance, and anxiety: F03.90

## 2019-04-28 NOTE — Progress Notes (Signed)
     Virtual Visit via Telephone Note  I connected with Kenneth Stark on 04/28/19 at  3:30 PM EDT by telephone and verified that I am speaking with the correct person using two identifiers.  Location: Patient: [facility] Provider: Physician in office.   I discussed the limitations, risks, security and privacy concerns of performing an evaluation and management service by telephone and the availability of in person appointments. I also discussed with the patient that there may be a patient responsible charge related to this service. The patient expressed understanding and agreed to proceed.   History of Present Illness: Kenneth Stark is a 85 year old right-handed white male with a history of Parkinson's disease, the patient essentially is nonambulatory.  He has developed a progressive dementing illness, he is on Namzeric.  He is having occasional hallucinations according to nursing staff, there is no associated agitation with this.  He has not had any falls, he does not try to get up out of bed on his own.  He recently had some problems with dehydration, over the last week they have reported increased problems with lethargy, they are having trouble getting him to eat and drink because of his sleepiness.  The patient is taking his medications well however.  He has had some progressive problems with memory.   Observations/Objective: The patient himself is sleepy, his speech is hypophonic, he will answer some questions, not fully oriented to date.  Assessment and Plan: 1.  Progressive dementia  2.  Recent lethargy  3.  Parkinson's disease  4.  Gait disorder  The dosing for the Namzeric and Sinemet will not be altered.  The patient is relatively inactive, he is nonambulatory.  If the lethargy continues, blood work and urinalysis should be done in the near future.  The nursing staff will try to ensure that he gets plenty of fluids in.  He will follow-up here in about  6 months.  Follow Up Instructions: 42-month follow-up, may see nurse practitioner   I discussed the assessment and treatment plan with the patient. The patient was provided an opportunity to ask questions and all were answered. The patient agreed with the plan and demonstrated an understanding of the instructions.   The patient was advised to call back or seek an in-person evaluation if the symptoms worsen or if the condition fails to improve as anticipated.  I provided 15 minutes of non-face-to-face time during this encounter.   [name], MD

## 2019-05-05 DIAGNOSIS — H2513 Age-related nuclear cataract, bilateral: Secondary | ICD-10-CM | POA: Diagnosis not present

## 2019-05-06 ENCOUNTER — Encounter: Payer: Self-pay | Admitting: Internal Medicine

## 2019-05-06 NOTE — Progress Notes (Signed)
Patient ID: Kenneth Stark, male   DOB: 1925/09/25, 85 y.o.   MRN: [id number]  Provider:   Location: [place] Room Number: 573 Place of Service:  SNF (31)   PCP: [name], DO Patient Care Team: [name], DO as PCP - General (Geriatric Medicine) Danella Sensing, MD as Consulting Physician (Dermatology) Sharyne Peach, MD as Consulting Physician (Ophthalmology) Irine Seal, MD as Attending Physician (Urology) [name], MD as Consulting Physician (Neurology)  Extended Emergency Contact Information Primary Emergency Contact: Venkatesh,[name] Address: [address]          [place] [address] [name] of [place] Phone: [phone] Relation: Spouse Secondary Emergency Contact: [name] States of [place] Mobile Phone: [phone] Relation: Daughter   Goals of Care: Advanced Directive information Advanced Directives 12/24/2018  Does Patient Have a Medical Advance Directive? Yes  Type of Paramedic of [place];Living will;Out of facility DNR (pink MOST or yellow form)  Does patient want to make changes to medical advance directive? No - Patient declined  Copy of [place] in Chart? Yes - validated most recent copy scanned in chart (See row information)  Would patient like information on creating a medical advance directive? -  Pre-existing out of facility DNR order (yellow form or pink MOST form) Yellow form placed in chart (order not valid for inpatient use)     Chief Complaint  Patient presents with  . Annual Exam    CPE    HPI: Patient is a 85 y.o. male seen today for an annual comprehensive examination.  Past Medical History:  Diagnosis Date  . Abnormality of gait   . Arthritis   . Brain bleed ([place]) 09/01/2016  . Cervical spondylosis   . Degenerative joint disease (DJD) of lumbar spine   . Dementia ([place]) 04/28/2019  . Depression   . Diplopia    . Dyslipidemia   . Essential tremor   . Foul smelling urine   . Frequent falls   . GERD (gastroesophageal reflux disease)   . Glycosuria   . Hearing difficulty    hearing aids  . Hyperlipidemia   . Hypertension   . Hyperthyroidism   . Memory loss   . Osteoarthritis   . Radiculopathy, lumbar region   . 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 Small vessel disease ([place])   . TIA (transient ischemic attack)    Past Surgical History:  Procedure Laterality Date  . APPENDECTOMY     done as a child  . GALLBLADDER SURGERY  2008  . KNEE ARTHROSCOPY Left    Dr. Hart Robinsons 2002  . knee injections Right    Dr. Adriana Mccallum  . TONSILLECTOMY     done as a child    reports that he has never smoked. He has never used smokeless tobacco. He reports current alcohol use. He reports that he does not use drugs. Social History   Socioeconomic History  . Marital status: Married    Spouse name: [name]  . Number of children: 4  . Years of education: Not on file  . Highest education level: Not on file  Occupational History  . Occupation: Retired    Comment: [employer]  Social Needs  . Financial resource strain: Not hard at all  . Food insecurity    Worry: Never true    Inability: Never true  . Transportation needs  Medical: No    Non-medical: No  Tobacco Use  . Smoking status: Never Smoker  . Smokeless tobacco: Never Used  Substance and Sexual Activity  . Alcohol use: Yes    Comment: Wife occass/rare  . Drug use: No  . Sexual activity: Not on file  Lifestyle  . Physical activity    Days per week: 0 days    Minutes per session: 0 min  . Stress: Not at all  Relationships  . Social connections    Talks on phone: More than three times a week    Gets together: More than three times a week    Attends religious service: Never    Active member of club or organization: No    Attends meetings of clubs or organizations: Never    Relationship  status: Married  Other Topics Concern  . Not on file  Social History Narrative   Tobacco use, amount per day now: NEVER   Past tobacco use, amount per day: never    How many years did you use tobacco:   Alcohol use (drinks per week): OCCASIONAL GLASS OF WINE   Diet:HIGH FIBER, LOW FAT   Do you drink/eat things with caffeine:COFFEE IN THE MORNING   Marital status:    MARRIED                             What year were you married? 1951   Do you live in a house, apartment, assisted living, condo, trailer, etc.? Assisted living    Is it one or more stories?   How many persons live in your home?    Do you have pets in your home?( please list) NO   Highest level of education completed: [employer] classes    Current or past profession: [employer].   Do you exercise?    Walking    Type and how often? 3 times a week    Do you have a living will? YES   Do you have a DNR form?  YES                                 If not, do you want to discuss one?   Do you have signed POA/HPOA forms?  YES                      If so, please bring to you appointment      Has difficulty bathing or dressing self   Has difficulty preparing food    Has difficulty managing medications   Has difficulty managing finances   Does not have any difficulty affording medications    Family History  Problem Relation Age of Onset  . Stroke Mother   . Dementia Mother   . Stroke Father   . Heart disease Father   . Diabetes Father   . Dementia Brother   . Renal Disease Brother   . Diabetes Brother   . Renal Disease Daughter     Pertinent  Health Maintenance Due  Topic Date Due  . URINE MICROALBUMIN  10/05/1937  . INFLUENZA VACCINE  05/17/2019  . PNA vac Low Risk Adult  Completed   Fall Risk  01/04/2019 07/17/2018 08/21/2017 04/04/2017 02/26/2017  Falls in the past year? Exclusion - non ambulatory No No No No  Depression screen [facility] 2/9 01/04/2019 07/17/2018  08/21/2017 04/04/2017  Decreased Interest 0 0 0 0  Down, Depressed, Hopeless 0 0 1 0  PHQ - 2 Score 0 0 1 0    Functional Status Survey:    No Known Allergies  Outpatient Encounter Medications as of 05/06/2019  Medication Sig  . amLODipine (NORVASC) 10 MG tablet Take 1 tablet (10 mg total) by mouth daily.  Marland Kitchen ammonium lactate (AMLACTIN) 12 % cream Apply 1 Bottle topically at bedtime.   Marland Kitchen aspirin EC 81 MG tablet Take 81 mg by mouth daily.  . carbidopa-levodopa (SINEMET IR) 25-100 MG tablet Take 1.5 tablets by mouth 3 (three) times daily.  . carboxymethylcellulose (REFRESH PLUS) 0.5 % SOLN Apply 2 drops to eye as needed (dry eyes).   . clopidogrel (PLAVIX) 75 MG tablet Take 75 mg by mouth daily.  . Cranberry 400 MG CAPS Take 1 capsule by mouth 3 (three) times daily.  Marland Kitchen guaiFENesin-codeine (ROBITUSSIN AC) 100-10 MG/5ML syrup Take 5 mLs by mouth every 6 (six) hours as needed for cough.  Marland Kitchen ipratropium-albuterol (DUONEB) 0.5-2.5 (3) MG/3ML SOLN Take 3 mLs by nebulization every 8 (eight) hours as needed (cough).   . MELATONIN-CHAMOMILE PO Take by mouth at bedtime as needed.  . Memantine HCl-Donepezil HCl (NAMZARIC) 28-10 MG CP24 Take 1 capsule at bedtime by mouth.   . Menthol, Topical Analgesic, (BIOFREEZE) 4 % GEL Apply 1 application topically 2 (two) times daily as needed (pain).   . ondansetron (ZOFRAN) 4 MG tablet Take 4 mg by mouth every 8 (eight) hours as needed for nausea or vomiting.  . pantoprazole (PROTONIX) 40 MG tablet Take 1 tablet (40 mg total) by mouth daily.  . polyethylene glycol (MIRALAX / GLYCOLAX) packet Take 17 g by mouth every other day.  . senna-docusate (SENOKOT-S) 8.6-50 MG tablet Take 2 tablets 2 (two) times daily by mouth.   . sertraline (ZOLOFT) 50 MG tablet Take 50 mg by mouth at bedtime.  . triamcinolone (NASACORT) 55 MCG/ACT AERO nasal inhaler Place 2 sprays into the nose at bedtime.   No facility-administered encounter medications on file as of 05/06/2019.      Review of Systems  Vitals:   05/06/19 1155  BP: (!) 141/63  Pulse: 67  Resp: 17  Temp: 98.5 F (36.9 C)  TempSrc: Oral  SpO2: 96%  Weight: 160 lb (72.6 kg)  Height: 5\' 8"  (1.727 m)   Body mass index is 24.33 kg/m. Physical Exam  Labs reviewed: Basic Metabolic Panel: Recent Labs    04/07/19 04/08/19 04/11/19  NA 136* 143 141  K 3.3* 3.9 3.3*  BUN 56* 50* 17  CREATININE 1.5* 1.5* 0.8   Liver Function Tests: No results for input(s): AST, ALT, ALKPHOS, BILITOT, PROT, ALBUMIN in the last 8760 hours. No results for input(s): LIPASE, AMYLASE in the last 8760 hours. No results for input(s): AMMONIA in the last 8760 hours. CBC: Recent Labs    05/20/18 04/04/19 04/08/19  WBC 9.1 17.6 13.0  HGB 12.6* 10.8* 10.8*  HCT 39* 33* 32*  PLT 318 383 460*   Cardiac Enzymes: No results for input(s): CKTOTAL, CKMB, CKMBINDEX, TROPONINI in the last 8760 hours. BNP: Invalid input(s): POCBNP Lab Results  Component Value Date   HGBA1C 6.8 (H) 09/07/2016   Lab Results  Component Value Date   TSH 3.14 01/18/2018   Lab Results  Component Value Date   [id number] 326 12/08/2016   No results found for: FOLATE No results found for: IRON, TIBC,  FERRITIN  Imaging and Procedures obtained recently: No results found.  Assessment/Plan There are no diagnoses linked to this encounter.   Family/ staff Communication:   Labs/tests ordered:

## 2019-05-06 NOTE — Progress Notes (Signed)
This encounter was created in error - please disregard.

## 2019-05-22 ENCOUNTER — Non-Acute Institutional Stay (SKILLED_NURSING_FACILITY): Payer: Medicare Other | Admitting: Adult Health

## 2019-05-22 DIAGNOSIS — E119 Type 2 diabetes mellitus without complications: Secondary | ICD-10-CM | POA: Diagnosis not present

## 2019-05-22 DIAGNOSIS — Z960 Presence of urogenital implants: Secondary | ICD-10-CM

## 2019-05-22 DIAGNOSIS — H903 Sensorineural hearing loss, bilateral: Secondary | ICD-10-CM

## 2019-05-22 DIAGNOSIS — F0280 Dementia in other diseases classified elsewhere without behavioral disturbance: Secondary | ICD-10-CM

## 2019-05-22 DIAGNOSIS — G20 Parkinson's disease: Secondary | ICD-10-CM | POA: Diagnosis not present

## 2019-05-22 DIAGNOSIS — Z978 Presence of other specified devices: Secondary | ICD-10-CM

## 2019-05-22 DIAGNOSIS — G309 Alzheimer's disease, unspecified: Secondary | ICD-10-CM | POA: Diagnosis not present

## 2019-05-22 DIAGNOSIS — F0150 Vascular dementia without behavioral disturbance: Secondary | ICD-10-CM

## 2019-05-22 DIAGNOSIS — K5901 Slow transit constipation: Secondary | ICD-10-CM

## 2019-05-23 ENCOUNTER — Encounter: Payer: Self-pay | Admitting: Internal Medicine

## 2019-05-23 DIAGNOSIS — E119 Type 2 diabetes mellitus without complications: Secondary | ICD-10-CM | POA: Diagnosis not present

## 2019-05-23 LAB — MICROALBUMIN, URINE: Microalb, Ur: 5.6

## 2019-05-28 ENCOUNTER — Encounter: Payer: Self-pay | Admitting: Adult Health

## 2019-05-28 NOTE — Progress Notes (Signed)
Location:  Diplomatic Services operational officer of Service:  SNF (31) Provider:   Cindi Carbon, ANP [place] [phone]   [name], DO  Patient Care Team: [name], DO as PCP - General (Geriatric Medicine) Danella Sensing, MD as Consulting Physician (Dermatology) Sharyne Peach, MD as Consulting Physician (Ophthalmology) Irine Seal, MD as Attending Physician (Urology) [name], MD as Consulting Physician (Neurology)  Extended Emergency Contact Information Primary Emergency Contact: Sachdev,[name] Address: [address]          [place] [address] [name] of [place] Phone: [phone] Relation: Spouse Secondary Emergency Contact: [name] States of [place] Mobile Phone: [phone] Relation: Daughter  Code Status: DNR Goals of care: Advanced Directive information Advanced Directives 12/24/2018  Does Patient Have a Medical Advance Directive? Yes  Type of Paramedic of [place];Living will;Out of facility DNR (pink MOST or yellow form)  Does patient want to make changes to medical advance directive? No - Patient declined  Copy of [place] in Chart? Yes - validated most recent copy scanned in chart (See row information)  Would patient like information on creating a medical advance directive? -  Pre-existing out of facility DNR order (yellow form or pink MOST form) Yellow form placed in chart (order not valid for inpatient use)     Chief Complaint  Patient presents with  . Medical Management of Chronic Issues    HPI:  Pt is a 85 y.o. male seen today for medical management of chronic diseases.    Mr. Monks overcame an acute illness with pna and UTI last month. He is back to baseline. Due to underlying dementia he has difficulty communicating his hx but denies any dysuria, abd pain, back pain, fever, etc. He has a foley cathter due to BPH and is prone to  UTIs. At times small amts of blood come through the catheter. He takes plavix and aspirin as prevention due to his previous hx of TIA and vascular dementia.   He has been drinking diet coke since his blood sugars were elevated.   Blood sugars controlled after a period so of elevation associated with acute illness   Functional status: lift for transfers. Incontinent   Past Medical History:  Diagnosis Date  . Abnormality of gait   . Arthritis   . Brain bleed ([place]) 09/01/2016  . Cervical spondylosis   . Degenerative joint disease (DJD) of lumbar spine   . Dementia ([place]) 04/28/2019  . Depression   . Diplopia   . Dyslipidemia   . Essential tremor   . Foul smelling urine   . Frequent falls   . GERD (gastroesophageal reflux disease)   . Glycosuria   . Hearing difficulty    hearing aids  . Hyperlipidemia   . Hypertension   . Hyperthyroidism   . Memory loss   . Osteoarthritis   . Radiculopathy, lumbar region   . 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 Small vessel disease ([place])   . TIA (transient ischemic attack)    Past Surgical History:  Procedure Laterality Date  . APPENDECTOMY     done as a child  . GALLBLADDER SURGERY  2008  . KNEE ARTHROSCOPY Left    Dr. Hart Robinsons 2002  . knee injections Right    Dr. Adriana Mccallum  . TONSILLECTOMY     done as a child    No  Known Allergies  Outpatient Encounter Medications as of 05/22/2019  Medication Sig  . amLODipine (NORVASC) 10 MG tablet Take 1 tablet (10 mg total) by mouth daily.  Marland Kitchen ammonium lactate (AMLACTIN) 12 % cream Apply 1 Bottle topically at bedtime.   Marland Kitchen aspirin EC 81 MG tablet Take 81 mg by mouth daily.  . carbidopa-levodopa (SINEMET IR) 25-100 MG tablet Take 1.5 tablets by mouth 3 (three) times daily.  . carboxymethylcellulose (REFRESH PLUS) 0.5 % SOLN Apply 2 drops to eye as needed (dry eyes).   . clopidogrel (PLAVIX) 75 MG tablet Take 75 mg by mouth daily.  . Cranberry 400 MG CAPS Take 1  capsule by mouth 3 (three) times daily.  Marland Kitchen FLAXSEED, LINSEED, PO Take 15 mLs by mouth.  Marland Kitchen guaiFENesin-codeine (ROBITUSSIN AC) 100-10 MG/5ML syrup Take 5 mLs by mouth every 6 (six) hours as needed for cough.  Marland Kitchen ipratropium-albuterol (DUONEB) 0.5-2.5 (3) MG/3ML SOLN Take 3 mLs by nebulization every 8 (eight) hours as needed (cough).   . MELATONIN-CHAMOMILE PO Take by mouth at bedtime as needed.  . Memantine HCl-Donepezil HCl (NAMZARIC) 28-10 MG CP24 Take 1 capsule at bedtime by mouth.   . Menthol, Topical Analgesic, (BIOFREEZE) 4 % GEL Apply 1 application topically 2 (two) times daily as needed (pain).   . ondansetron (ZOFRAN) 4 MG tablet Take 4 mg by mouth every 8 (eight) hours as needed for nausea or vomiting.  . pantoprazole (PROTONIX) 40 MG tablet Take 1 tablet (40 mg total) by mouth daily.  . polyethylene glycol (MIRALAX / GLYCOLAX) packet Take 17 g by mouth every other day.  . senna-docusate (SENOKOT-S) 8.6-50 MG tablet Take 2 tablets 2 (two) times daily by mouth.   . sertraline (ZOLOFT) 50 MG tablet Take 50 mg by mouth at bedtime.  . triamcinolone (NASACORT) 55 MCG/ACT AERO nasal inhaler Place 2 sprays into the nose at bedtime.   No facility-administered encounter medications on file as of 05/22/2019.     Review of Systems  Constitutional: Negative for activity change, appetite change, chills, diaphoresis, fatigue, fever and unexpected weight change.  Respiratory: Positive for cough (chronic). Negative for shortness of breath, wheezing and stridor.   Cardiovascular: Negative for chest pain, palpitations and leg swelling.  Gastrointestinal: Negative for abdominal distention, abdominal pain, constipation and diarrhea.  Genitourinary: Negative for difficulty urinating and dysuria.  Musculoskeletal: Positive for gait problem. Negative for arthralgias, back pain, joint swelling and myalgias.  Neurological: Negative for dizziness, seizures, syncope, facial asymmetry, speech difficulty, weakness  and headaches.  Hematological: Negative for adenopathy. Does not bruise/bleed easily.  Psychiatric/Behavioral: Positive for confusion. Negative for agitation and behavioral problems.    Immunization History  Administered Date(s) Administered  . Influenza Inj Mdck Quad Pf 08/09/2017  . Influenza, High Dose Seasonal PF 07/31/2016  . Influenza,inj,Quad PF,6+ Mos 08/06/2018  . Influenza-Unspecified 08/01/2010, 07/30/2012, 10/16/2012, 10/16/2013  . Pneumococcal Polysaccharide-23 02/23/2004, 02/21/2013  . Pneumococcal-Unspecified 03/17/2016  . Td 12/02/2008  . Tdap 09/01/2016  . Zoster 03/28/2006  . Zoster Recombinat (Shingrix) 10/29/2017, 01/24/2018   Pertinent  Health Maintenance Due  Topic Date Due  . INFLUENZA VACCINE  05/17/2019  . URINE MICROALBUMIN  05/21/2020  . PNA vac Low Risk Adult  Completed   Fall Risk  01/04/2019 07/17/2018 08/21/2017 04/04/2017 02/26/2017  Falls in the past year? Exclusion - non ambulatory No No No No   Functional Status Survey:    There were no vitals filed for this visit. There is no height or weight on file to calculate  BMI. Physical Exam Constitutional:      General: He is not in acute distress.    Appearance: He is not diaphoretic.  HENT:     Head: Normocephalic and atraumatic.  Neck:     Thyroid: No thyromegaly.     Vascular: No JVD.     Trachea: No tracheal deviation.  Cardiovascular:     Rate and Rhythm: Normal rate and regular rhythm.     Heart sounds: No murmur.  Pulmonary:     Effort: Pulmonary effort is normal. No respiratory distress.     Breath sounds: Normal breath sounds. No wheezing.  Abdominal:     General: Bowel sounds are normal. There is no distension.     Palpations: Abdomen is soft.     Tenderness: There is no abdominal tenderness.  Musculoskeletal:     Right lower leg: No edema.     Left lower leg: No edema.  Lymphadenopathy:     Cervical: No cervical adenopathy.  Skin:    General: Skin is warm and dry.   Neurological:     General: No focal deficit present.     Mental Status: He is alert. Mental status is at baseline.     Labs reviewed: Recent Labs    04/07/19 04/08/19 04/11/19  NA 136* 143 141  K 3.3* 3.9 3.3*  BUN 56* 50* 17  CREATININE 1.5* 1.5* 0.8   No results for input(s): AST, ALT, ALKPHOS, BILITOT, PROT, ALBUMIN in the last 8760 hours. Recent Labs    04/04/19 04/08/19  WBC 17.6 13.0  HGB 10.8* 10.8*  HCT 33* 32*  PLT 383 460*   Lab Results  Component Value Date   TSH 3.14 01/18/2018   Lab Results  Component Value Date   HGBA1C 6.8 (H) 09/07/2016   Lab Results  Component Value Date   CHOL 127 05/11/2017   HDL 50 05/11/2017   LDLCALC 58 05/11/2017   TRIG 98 05/11/2017    Significant Diagnostic Results in last 30 days:  No results found.  Assessment/Plan 1. Parkinsonism, unspecified Parkinsonism type ([place]) Followed by Dr. Jannifer Franklin. Continue sinemet as prescribed.  2. Sensorineural hearing loss (SNHL), bilateral Noted   3. Constipation  Continue miralax qod and senokot s 2 bid   4. Mixed Alzheimer's and vascular dementia ([place]) Progressive decline in cognition and physical function c/w the disease. Continue supportive care in the skilled environment. Continue Namzaric, plavix, and aspirin  5. Chronic indwelling Foley catheter Due to BPH Has frequent UTIs  Continue cranberry supplementation per family request Cath care[facility]maintanence [facility] by [facility]. Monitor for hematuria.   6. Controlled type 2 diabetes mellitus without complication, without long-term current use of insulin (HCC) Controlled. Monitor A1C q 6 months. Decrease carb/sugar intake.  Check urine micro   Family/ staff Communication: staff  Labs/tests ordered: urine micro ordered

## 2019-06-24 ENCOUNTER — Non-Acute Institutional Stay (SKILLED_NURSING_FACILITY): Payer: Medicare Other | Admitting: Internal Medicine

## 2019-06-24 ENCOUNTER — Encounter: Payer: Self-pay | Admitting: Internal Medicine

## 2019-06-24 DIAGNOSIS — G309 Alzheimer's disease, unspecified: Secondary | ICD-10-CM

## 2019-06-24 DIAGNOSIS — E119 Type 2 diabetes mellitus without complications: Secondary | ICD-10-CM | POA: Diagnosis not present

## 2019-06-24 DIAGNOSIS — G214 Vascular parkinsonism: Secondary | ICD-10-CM

## 2019-06-24 DIAGNOSIS — Z978 Presence of other specified devices: Secondary | ICD-10-CM

## 2019-06-24 DIAGNOSIS — H903 Sensorineural hearing loss, bilateral: Secondary | ICD-10-CM

## 2019-06-24 DIAGNOSIS — Z960 Presence of urogenital implants: Secondary | ICD-10-CM

## 2019-06-24 DIAGNOSIS — F0280 Dementia in other diseases classified elsewhere without behavioral disturbance: Secondary | ICD-10-CM

## 2019-06-24 DIAGNOSIS — L89622 Pressure ulcer of left heel, stage 2: Secondary | ICD-10-CM | POA: Insufficient documentation

## 2019-06-24 DIAGNOSIS — F0150 Vascular dementia without behavioral disturbance: Secondary | ICD-10-CM

## 2019-06-24 HISTORY — DX: Type 2 diabetes mellitus without complications: E11.9

## 2019-06-24 NOTE — Progress Notes (Signed)
Patient ID: Kenneth Stark, male   DOB: 07/08/1925, 85 y.o.   MRN: [id number]  Location:  [place] Room Number: [place] of Service:  SNF ([phone]) Provider:   [name], DO  Patient Care Team: Kenneth Curry, DO as PCP - General (Geriatric Medicine) Kenneth Sensing, MD as Consulting Physician (Dermatology) Kenneth Peach, MD as Consulting Physician (Ophthalmology) Kenneth Seal, MD as Attending Physician (Urology) Kenneth Ducking, MD as Consulting Physician (Neurology)  Extended Emergency Contact Information Primary Emergency Contact: Kenneth Stark Address: [id number]          [place] [address] [name] of [place] Phone: [phone] Relation: Spouse Secondary Emergency Contact: Kenneth Stark States of [place] Mobile Phone: [phone] Relation: Daughter  Code Status:  DNR Goals of care: Advanced Directive information Advanced Directives 12/24/2018  Does Patient Have a Medical Advance Directive? Yes  Type of Paramedic of [place];Living will;Out of facility DNR (pink MOST or yellow form)  Does patient want to make changes to medical advance directive? No - Patient declined  Copy of [place] in Chart? Yes - validated most recent copy scanned in chart (See row information)  Would patient like information on creating a medical advance directive? -  Pre-existing out of facility DNR order (yellow form or pink MOST form) Yellow form placed in chart (order not valid for inpatient use)     Chief Complaint  Patient presents with  . Medical Management of Chronic Issues    Routine Visit    HPI:  Pt is a 85 y.o. male seen today for medical management of chronic diseases.  He is in SNF for long term care after TBI and prior vascular dementia with parkinsonism.  When I first went to see him, he was having a visit with his wife in the clinic waiting room.  When I returned  after lunch, he was napping.  He opened his eyes and said hello.  I asked him how he was doing and he kept his eyes closed, just peaking one open.  He would not respond if it was ok for me to examine him.  He does have hearing loss, but hearing aids were intact.  He remains on namzaric for his dementia.  Nursing had no new concerns with him since his infections in July.  They've been pushing fluids to try to keep his UTIs away.    He continues to require a hoyer lift for transfers.  He's incontinent of bowel and has a catheter for his urine.   His heel wound is finally healing.    His DMII is well-controlled unless he gets into coca colas.    BPs are mostly at goal, as well with norvasc.    Past Medical History:  Diagnosis Date  . Abnormality of gait   . Arthritis   . Brain bleed ([place]) 09/01/2016  . Cervical spondylosis   . Degenerative joint disease (DJD) of lumbar spine   . Dementia ([place]) 04/28/2019  . Depression   . Diplopia   . Dyslipidemia   . Essential tremor   . Foul smelling urine   . Frequent falls   . GERD (gastroesophageal reflux disease)   . Glycosuria   . Hearing difficulty    hearing aids  . Hyperlipidemia   . Hypertension   . Hyperthyroidism   . Memory loss   . Osteoarthritis   . Radiculopathy, lumbar region   . Sixth nerve palsy  last  left brain 11/2006  02/1998 08/2002  . Small vessel disease ([place])   . TIA (transient ischemic attack)    Past Surgical History:  Procedure Laterality Date  . APPENDECTOMY     done as a child  . GALLBLADDER SURGERY  2008  . KNEE ARTHROSCOPY Left    Dr. Hart Robinsons 2002  . knee injections Right    Dr. Adriana Mccallum  . TONSILLECTOMY     done as a child    No Known Allergies  Outpatient Encounter Medications as of 06/24/2019  Medication Sig  . amLODipine (NORVASC) 10 MG tablet Take 1 tablet (10 mg total) by mouth daily.  Marland Kitchen ammonium lactate (AMLACTIN) 12 % cream Apply 1 Bottle topically at bedtime.   Marland Kitchen aspirin EC 81 MG  tablet Take 81 mg by mouth daily.  . carbidopa-levodopa (SINEMET IR) 25-100 MG tablet Take 1.5 tablets by mouth 3 (three) times daily.  . carboxymethylcellulose (REFRESH PLUS) 0.5 % SOLN Apply 2 drops to eye as needed (dry eyes).   . clopidogrel (PLAVIX) 75 MG tablet Take 75 mg by mouth daily.  . Cranberry 400 MG CAPS Take 1 capsule by mouth 3 (three) times daily.  Marland Kitchen FLAXSEED, LINSEED, PO Take 15 mLs by mouth.  Marland Kitchen guaiFENesin-codeine (ROBITUSSIN AC) 100-10 MG/5ML syrup Take 5 mLs by mouth every 6 (six) hours as needed for cough.  Marland Kitchen ipratropium-albuterol (DUONEB) 0.5-2.5 (3) MG/3ML SOLN Take 3 mLs by nebulization every 8 (eight) hours as needed (cough).   . MELATONIN-CHAMOMILE PO Take by mouth at bedtime as needed.  . Memantine HCl-Donepezil HCl (NAMZARIC) 28-10 MG CP24 Take 1 capsule at bedtime by mouth.   . Menthol, Topical Analgesic, (BIOFREEZE) 4 % GEL Apply 1 application topically 2 (two) times daily as needed (pain).   . ondansetron (ZOFRAN) 4 MG tablet Take 4 mg by mouth every 8 (eight) hours as needed for nausea or vomiting.  . pantoprazole (PROTONIX) 40 MG tablet Take 1 tablet (40 mg total) by mouth daily.  . polyethylene glycol (MIRALAX / GLYCOLAX) packet Take 17 g by mouth every other day.  . senna-docusate (SENOKOT-S) 8.6-50 MG tablet Take 2 tablets 2 (two) times daily by mouth.   . sertraline (ZOLOFT) 50 MG tablet Take 50 mg by mouth at bedtime.  . triamcinolone (NASACORT) 55 MCG/ACT AERO nasal inhaler Place 2 sprays into the nose at bedtime.   No facility-administered encounter medications on file as of 06/24/2019.     Review of Systems  Constitutional: Negative for activity change, appetite change, chills, fatigue, fever and unexpected weight change.  HENT: Positive for hearing loss. Negative for congestion.   Eyes: Negative for visual disturbance.       Glasses  Respiratory: Negative for chest tightness and shortness of breath.   Cardiovascular: Negative for chest pain,  palpitations and leg swelling.  Genitourinary:       Indwelling foley with yellow urine; occasional hematuria related to catheter use/jarring  Musculoskeletal: Positive for gait problem. Negative for arthralgias and back pain.  Skin: Negative for color change.  Neurological: Positive for weakness. Negative for dizziness.  Hematological: Bruises/bleeds easily.  Psychiatric/Behavioral: Positive for confusion. Negative for agitation, behavioral problems and sleep disturbance. The patient is not nervous/anxious.     Immunization History  Administered Date(s) Administered  . Influenza Inj Mdck Quad Pf 08/09/2017  . Influenza, High Dose Seasonal PF 07/31/2016  . Influenza,inj,Quad PF,6+ Mos 08/06/2018  . Influenza-Unspecified 08/01/2010, 07/30/2012, 10/16/2012, 10/16/2013  . Pneumococcal Polysaccharide-23 02/23/2004, 02/21/2013  .  Pneumococcal-Unspecified 03/17/2016  . Td 12/02/2008  . Tdap 09/01/2016  . Zoster 03/28/2006  . Zoster Recombinat (Shingrix) 10/29/2017, 01/24/2018   Pertinent  Health Maintenance Due  Topic Date Due  . INFLUENZA VACCINE  05/17/2019  . URINE MICROALBUMIN  05/22/2020  . PNA vac Low Risk Adult  Completed   Fall Risk  01/04/2019 07/17/2018 08/21/2017 04/04/2017 02/26/2017  Falls in the past year? Exclusion - non ambulatory No No No No   Functional Status Survey:  dependent in adls  Vitals:   [id number]  BP: (!) 150/62  Pulse: 60  Resp: 18  Temp: 97.8 F (36.6 C)  TempSrc: Oral  SpO2: 97%  Weight: 157 lb (71.2 kg)  Height: 5\' 8"  (1.727 m)   Body mass index is 23.87 kg/m. Physical Exam Vitals signs and nursing note reviewed.  Constitutional:      General: He is not in acute distress.    Appearance: He is not toxic-appearing.  HENT:     Head: Normocephalic and atraumatic.  Cardiovascular:     Rate and Rhythm: Normal rate and regular rhythm.     Pulses: Normal pulses.     Heart sounds: Normal heart sounds.  Pulmonary:     Effort: Pulmonary  effort is normal.     Breath sounds: Normal breath sounds. No wheezing, rhonchi or rales.  Abdominal:     General: Bowel sounds are normal.     Palpations: Abdomen is soft.     Tenderness: There is no abdominal tenderness.  Musculoskeletal: Normal range of motion.     Right lower leg: No edema.     Left lower leg: No edema.     Comments: Uses high back manual wheelchair  Skin:    General: Skin is warm and dry.     Capillary Refill: Capillary refill takes less than 2 seconds.     Comments: Left lateral heel wound less than a 1cm with pink tissue   Neurological:     Mental Status: Mental status is at baseline.     Motor: Weakness present.     Gait: Gait abnormal.     Labs reviewed: Recent Labs    04/07/19 04/08/19 04/11/19  NA 136* 143 141  K 3.3* 3.9 3.3*  BUN 56* 50* 17  CREATININE 1.5* 1.5* 0.8   No results for input(s): AST, ALT, ALKPHOS, BILITOT, PROT, ALBUMIN in the last 8760 hours. Recent Labs    04/04/19 04/08/19  WBC 17.6 13.0  HGB 10.8* 10.8*  HCT 33* 32*  PLT 383 460*   Lab Results  Component Value Date   TSH 3.14 01/18/2018   Lab Results  Component Value Date   HGBA1C 6.8 (H) 09/07/2016   Lab Results  Component Value Date   CHOL 127 05/11/2017   HDL 50 05/11/2017   LDLCALC 58 05/11/2017   TRIG 98 05/11/2017    Assessment/Plan 1. Mixed Alzheimer's and vascular dementia ([place]) -progressing gradually, has good and bad days of alertness -was not up for visit today -continues on namzaric but benefit weaning  2. 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 Sensorineural hearing loss (SNHL), bilateral -continue hearing aids, still HOH at times with them  4. Chronic indwelling Foley catheter -for obstructive uropathy -has frequent UTIs, must be kept hydrated which takes regular encouragement  5. Controlled type 2 diabetes mellitus without complication, without long-term current use of insulin ([place]) -had some elevated glucoses  on bmp with coke intake -no recent hba1c has been abstracted but  was in diabetic range a couple of years ago Lab Results  Component Value Date   HGBA1C 6.8 (H) 09/07/2016  -check new hba1c--info may help [place] predict wound healing  6. Pressure injury of left heel, stage 2 ([place]) -is now getting smaller and slowly healing, cont current dressing and close monitoring, offloading  Family/ staff Communication: discussed with snf nurse  Labs/tests ordered:  No new  Heber Hoog L. Mishka Stegemann, D.O. [facility] [address], [place] [address] Cell Phone (Mon-Fri 8am-5pm):  [phone] On Call:  [phone] & follow prompts after 5pm & weekends Office Phone:  [phone] Office Fax:  [phone]

## 2019-06-27 DIAGNOSIS — R319 Hematuria, unspecified: Secondary | ICD-10-CM | POA: Diagnosis not present

## 2019-06-27 DIAGNOSIS — N390 Urinary tract infection, site not specified: Secondary | ICD-10-CM | POA: Diagnosis not present

## 2019-06-27 DIAGNOSIS — Z79899 Other long term (current) drug therapy: Secondary | ICD-10-CM | POA: Diagnosis not present

## 2019-06-30 ENCOUNTER — Encounter: Payer: Self-pay | Admitting: Adult Health

## 2019-06-30 ENCOUNTER — Non-Acute Institutional Stay (SKILLED_NURSING_FACILITY): Payer: Medicare Other | Admitting: Adult Health

## 2019-06-30 DIAGNOSIS — Z1612 Extended spectrum beta lactamase (ESBL) resistance: Secondary | ICD-10-CM

## 2019-06-30 DIAGNOSIS — E119 Type 2 diabetes mellitus without complications: Secondary | ICD-10-CM

## 2019-06-30 DIAGNOSIS — B9629 Other Escherichia coli [E. coli] as the cause of diseases classified elsewhere: Secondary | ICD-10-CM

## 2019-06-30 DIAGNOSIS — N390 Urinary tract infection, site not specified: Secondary | ICD-10-CM | POA: Diagnosis not present

## 2019-06-30 NOTE — Progress Notes (Signed)
Location:  Occupational psychologist of Service:  SNF (31) Provider:   Cindi Carbon, ANP [place] [phone]   [name], DO  Patient Care Team: [name], DO as PCP - General (Geriatric Medicine) Danella Sensing, MD as Consulting Physician (Dermatology) Sharyne Peach, MD as Consulting Physician (Ophthalmology) Irine Seal, MD as Attending Physician (Urology) [name], MD as Consulting Physician (Neurology)  Extended Emergency Contact Information Primary Emergency Contact: Banks,[name] Address: [id number]          [place] [address] [name] of [place] Phone: [phone] Relation: Spouse Secondary Emergency Contact: [name] States of [place] Mobile Phone: [phone] Relation: Daughter  Code Status:  DNR Goals of care: Advanced Directive information Advanced Directives 12/24/2018  Does Patient Have a Medical Advance Directive? Yes  Type of Paramedic of [place];Living will;Out of facility DNR (pink MOST or yellow form)  Does patient want to make changes to medical advance directive? No - Patient declined  Copy of [place] in Chart? Yes - validated most recent copy scanned in chart (See row information)  Would patient like information on creating a medical advance directive? -  Pre-existing out of facility DNR order (yellow form or pink MOST form) Yellow form placed in chart (order not valid for inpatient use)     Chief Complaint  Patient presents with  . Acute Visit    UTI    HPI:  Pt is a 85 y.o. male seen today for an acute visit for UTI.  On 06/26/19 he developed a low grade temp, blood was noted in the urine, and he was more confused. He has a hx of chronic indwelling foley due to BPH and has recurrent UTI. A urine was sent which grew 100,000 colonies of ESBL E Coli and Serratia 100,000 colonies both sensitive to Ertapenem.     Past  Medical History:  Diagnosis Date  . Abnormality of gait   . Arthritis   . Brain bleed ([place]) 09/01/2016  . Cervical spondylosis   . Controlled type 2 diabetes mellitus without complication, without long-term current use of insulin ([place]) 06/24/2019  . Degenerative joint disease (DJD) of lumbar spine   . Dementia ([place]) 04/28/2019  . Depression   . Diplopia   . Dyslipidemia   . Essential tremor   . Foul smelling urine   . Frequent falls   . GERD (gastroesophageal reflux disease)   . Glycosuria   . Hearing difficulty    hearing aids  . Hyperlipidemia   . Hypertension   . Hyperthyroidism   . Memory loss   . Osteoarthritis   . Radiculopathy, lumbar region   . 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 Small vessel disease ([place])   . TIA (transient ischemic attack)    Past Surgical History:  Procedure Laterality Date  . APPENDECTOMY     done as a child  . GALLBLADDER SURGERY  2008  . KNEE ARTHROSCOPY Left    Dr. Hart Robinsons 2002  . knee injections Right    Dr. Adriana Mccallum  . TONSILLECTOMY     done as a child    No Known Allergies  Outpatient Encounter Medications as of 06/30/2019  Medication Sig  . amLODipine (NORVASC) 10 MG tablet Take 1 tablet (10 mg total) by mouth daily.  Marland Kitchen ammonium lactate (AMLACTIN) 12 % cream Apply 1 Bottle topically at bedtime.   Marland Kitchen  aspirin EC 81 MG tablet Take 81 mg by mouth daily.  . carbidopa-levodopa (SINEMET IR) 25-100 MG tablet Take 1.5 tablets by mouth 3 (three) times daily.  . carboxymethylcellulose (REFRESH PLUS) 0.5 % SOLN Apply 2 drops to eye as needed (dry eyes).   . clopidogrel (PLAVIX) 75 MG tablet Take 75 mg by mouth daily.  . Cranberry 400 MG CAPS Take 1 capsule by mouth 3 (three) times daily.  . ertapenem (INVANZ) 1 g injection Inject 1 g into the muscle daily.  Marland Kitchen ipratropium-albuterol (DUONEB) 0.5-2.5 (3) MG/3ML SOLN Take 3 mLs by nebulization every 8 (eight) hours as needed (cough).   . MELATONIN-CHAMOMILE PO  Take by mouth at bedtime as needed.  . Memantine HCl-Donepezil HCl (NAMZARIC) 28-10 MG CP24 Take 1 capsule at bedtime by mouth.   . Menthol, Topical Analgesic, (BIOFREEZE) 4 % GEL Apply 1 application topically 2 (two) times daily as needed (pain).   . ondansetron (ZOFRAN) 4 MG tablet Take 4 mg by mouth every 8 (eight) hours as needed for nausea or vomiting.  . pantoprazole (PROTONIX) 40 MG tablet Take 1 tablet (40 mg total) by mouth daily.  . polyethylene glycol (MIRALAX / GLYCOLAX) packet Take 17 g by mouth every other day.  . senna-docusate (SENOKOT-S) 8.6-50 MG tablet Take 2 tablets 2 (two) times daily by mouth.   . sertraline (ZOLOFT) 50 MG tablet Take 50 mg by mouth at bedtime.  Marland Kitchen telmisartan (MICARDIS) 20 MG tablet Take 20 mg by mouth daily.  Marland Kitchen triamcinolone (NASACORT) 55 MCG/ACT AERO nasal inhaler Place 2 sprays into the nose at bedtime.  Marland Kitchen FLAXSEED, LINSEED, PO Take 15 mLs by mouth.  Marland Kitchen guaiFENesin-codeine (ROBITUSSIN AC) 100-10 MG/5ML syrup Take 5 mLs by mouth every 6 (six) hours as needed for cough.   No facility-administered encounter medications on file as of 06/30/2019.     Review of Systems  Unable to perform ROS: Dementia    Immunization History  Administered Date(s) Administered  . Influenza Inj Mdck Quad Pf 08/09/2017  . Influenza, High Dose Seasonal PF 07/31/2016  . Influenza,inj,Quad PF,6+ Mos 08/06/2018  . Influenza-Unspecified 08/01/2010, 07/30/2012, 10/16/2012, 10/16/2013  . Pneumococcal Polysaccharide-23 02/23/2004, 02/21/2013  . Pneumococcal-Unspecified 03/17/2016  . Td 12/02/2008  . Tdap 09/01/2016  . Zoster 03/28/2006  . Zoster Recombinat (Shingrix) 10/29/2017, 01/24/2018   Pertinent  Health Maintenance Due  Topic Date Due  . FOOT EXAM  10/05/1935  . OPHTHALMOLOGY EXAM  10/05/1935  . HEMOGLOBIN A1C  03/07/2017  . INFLUENZA VACCINE  05/17/2019  . URINE MICROALBUMIN  05/22/2020  . PNA vac Low Risk Adult  Completed   Fall Risk  01/04/2019 07/17/2018  08/21/2017 04/04/2017 02/26/2017  Falls in the past year? Exclusion - non ambulatory No No No No   Functional Status Survey:    Vitals:   06/30/19 1559  Temp: 98.1 F (36.7 C)   There is no height or weight on file to calculate BMI. Physical Exam Constitutional:      General: He is not in acute distress.    Appearance: He is not diaphoretic.  HENT:     Head: Normocephalic and atraumatic.  Neck:     Thyroid: No thyromegaly.     Vascular: No JVD.     Trachea: No tracheal deviation.  Cardiovascular:     Rate and Rhythm: Normal rate and regular rhythm.     Heart sounds: No murmur.  Pulmonary:     Effort: Pulmonary effort is normal. No respiratory distress.  Breath sounds: Normal breath sounds. No wheezing.  Abdominal:     General: Bowel sounds are normal. There is no distension.     Palpations: Abdomen is soft.     Tenderness: There is no abdominal tenderness. There is no right CVA tenderness or left CVA tenderness.  Genitourinary:    Comments: hypospadias noted, clear yellow urine in bag Lymphadenopathy:     Cervical: No cervical adenopathy.  Skin:    General: Skin is warm and dry.  Neurological:     General: No focal deficit present.     Comments: Sleepy but easily arouses and able to f/c. Oriented to self only.     Labs reviewed: Recent Labs    04/07/19 04/08/19 04/11/19  NA 136* 143 141  K 3.3* 3.9 3.3*  BUN 56* 50* 17  CREATININE 1.5* 1.5* 0.8   No results for input(s): AST, ALT, ALKPHOS, BILITOT, PROT, ALBUMIN in the last 8760 hours. Recent Labs    04/04/19 04/08/19  WBC 17.6 13.0  HGB 10.8* 10.8*  HCT 33* 32*  PLT 383 460*   Lab Results  Component Value Date   TSH 3.14 01/18/2018   Lab Results  Component Value Date   HGBA1C 6.8 (H) 09/07/2016   Lab Results  Component Value Date   CHOL 127 05/11/2017   HDL 50 05/11/2017   LDLCALC 58 05/11/2017   TRIG 98 05/11/2017    Significant Diagnostic Results in last 30 days:  No results found.   Assessment/Plan 1. UTI due to extended-spectrum beta lactamase (ESBL) producing Escherichia coli Ertapenem 1 gram IM qd x 7 days Place on contact isolation Encourage oral fluid  Monitor VS qshift   2. DM II Diet controlled Needs A1C, will draw in the morning.      Family/ staff Communication:  Staff   Labs/tests ordered:  NA

## 2019-07-01 DIAGNOSIS — E119 Type 2 diabetes mellitus without complications: Secondary | ICD-10-CM | POA: Diagnosis not present

## 2019-07-01 LAB — HEMOGLOBIN A1C: Hemoglobin A1C: 6.6

## 2019-07-09 DIAGNOSIS — N390 Urinary tract infection, site not specified: Secondary | ICD-10-CM | POA: Diagnosis not present

## 2019-07-09 DIAGNOSIS — R319 Hematuria, unspecified: Secondary | ICD-10-CM | POA: Diagnosis not present

## 2019-07-09 DIAGNOSIS — Z1612 Extended spectrum beta lactamase (ESBL) resistance: Secondary | ICD-10-CM | POA: Diagnosis not present

## 2019-07-16 DIAGNOSIS — Z9189 Other specified personal risk factors, not elsewhere classified: Secondary | ICD-10-CM | POA: Diagnosis not present

## 2019-07-16 LAB — NOVEL CORONAVIRUS, NAA: SARS-CoV-2, NAA: NOT DETECTED

## 2019-07-23 ENCOUNTER — Other Ambulatory Visit: Payer: Self-pay | Admitting: *Deleted

## 2019-07-30 ENCOUNTER — Non-Acute Institutional Stay (SKILLED_NURSING_FACILITY): Payer: Medicare Other | Admitting: Adult Health

## 2019-07-30 ENCOUNTER — Encounter: Payer: Self-pay | Admitting: Adult Health

## 2019-07-30 DIAGNOSIS — N401 Enlarged prostate with lower urinary tract symptoms: Secondary | ICD-10-CM | POA: Diagnosis not present

## 2019-07-30 DIAGNOSIS — G20 Parkinson's disease: Secondary | ICD-10-CM | POA: Diagnosis not present

## 2019-07-30 DIAGNOSIS — D649 Anemia, unspecified: Secondary | ICD-10-CM

## 2019-07-30 DIAGNOSIS — I10 Essential (primary) hypertension: Secondary | ICD-10-CM

## 2019-07-30 DIAGNOSIS — Z978 Presence of other specified devices: Secondary | ICD-10-CM

## 2019-07-30 DIAGNOSIS — R338 Other retention of urine: Secondary | ICD-10-CM

## 2019-07-30 DIAGNOSIS — E119 Type 2 diabetes mellitus without complications: Secondary | ICD-10-CM | POA: Diagnosis not present

## 2019-07-30 DIAGNOSIS — Z9189 Other specified personal risk factors, not elsewhere classified: Secondary | ICD-10-CM | POA: Diagnosis not present

## 2019-07-30 NOTE — Progress Notes (Signed)
Location:  Occupational psychologist of Service:  SNF (31) Provider:   Cindi Carbon, ANP [place] [phone]   [name], DO  Patient Care Team: [name], DO as PCP - General (Geriatric Medicine) Danella Sensing, MD as Consulting Physician (Dermatology) Sharyne Peach, MD as Consulting Physician (Ophthalmology) Irine Seal, MD as Attending Physician (Urology) [name], MD as Consulting Physician (Neurology)  Extended Emergency Contact Information Primary Emergency Contact: Ibarra,[name] Address: [id number]          [place] [address] [name] of [place] Phone: [phone] Relation: Spouse Secondary Emergency Contact: [name] States of [place] Mobile Phone: [phone] Relation: Daughter  Code Status:  DNR Goals of care: Advanced Directive information Advanced Directives 12/24/2018  Does Patient Have a Medical Advance Directive? Yes  Type of Paramedic of [place];Living will;Out of facility DNR (pink MOST or yellow form)  Does patient want to make changes to medical advance directive? No - Patient declined  Copy of [place] in Chart? Yes - validated most recent copy scanned in chart (See row information)  Would patient like information on creating a medical advance directive? -  Pre-existing out of facility DNR order (yellow form or pink MOST form) Yellow form placed in chart (order not valid for inpatient use)     Chief Complaint  Patient presents with  . Medical Management of Chronic Issues    HPI:  Pt is a 85 y.o. male seen today for medical management of chronic diseases.   BPH: has chronic indwelling foley and a hx of recurrent UTI. Treated for ESBL UTI in Sept with subsequent negative culture on 9/23.  No dysuria or fever at this time. He has a small amt of blood tinged urine in the bag.   BP improved with resuming Micardis  He has  lost 6 lbs in the past 3 months but has recently recovered from acute illness Wt Readings from Last 3 Encounters:  07/30/19 155 lb 8 oz (70.5 kg)  06/24/19 157 lb (71.2 kg)  05/06/19 160 lb (72.6 kg)   Parkinsonism: no tremor, mild rigidity noted to BUE and BLE. Takes sinemet and followed by Dr. Jannifer Franklin. No new issues  DM II: Diet controlled  Lab Results  Component Value Date   HGBA1C 6.6 07/01/2019       Past Medical History:  Diagnosis Date  . Abnormality of gait   . Arthritis   . Brain bleed ([place]) 09/01/2016  . Cervical spondylosis   . Controlled type 2 diabetes mellitus without complication, without long-term current use of insulin ([place]) 06/24/2019  . Degenerative joint disease (DJD) of lumbar spine   . Dementia ([place]) 04/28/2019  . Depression   . Diplopia   . Dyslipidemia   . Essential tremor   . Foul smelling urine   . Frequent falls   . GERD (gastroesophageal reflux disease)   . Glycosuria   . Hearing difficulty    hearing aids  . Hyperlipidemia   . Hypertension   . Hyperthyroidism   . Memory loss   . Osteoarthritis   . Radiculopathy, lumbar region   . 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 Small vessel disease ([facility])   . TIA (transient ischemic attack)    Past Surgical History:  Procedure Laterality Date  . APPENDECTOMY     done as a child  . GALLBLADDER SURGERY  2008  . KNEE ARTHROSCOPY Left    Dr. Hart Robinsons 2002  . knee injections Right    Dr. Adriana Mccallum  . TONSILLECTOMY     done as a child    No Known Allergies  Outpatient Encounter Medications as of 07/30/2019  Medication Sig  . amLODipine (NORVASC) 10 MG tablet Take 1 tablet (10 mg total) by mouth daily.  Marland Kitchen ammonium lactate (AMLACTIN) 12 % cream Apply 1 Bottle topically at bedtime.   Marland Kitchen aspirin EC 81 MG tablet Take 81 mg by mouth daily.  . carbidopa-levodopa (SINEMET IR) 25-100 MG tablet Take 1.5 tablets by mouth 3 (three) times daily.  . carboxymethylcellulose  (REFRESH PLUS) 0.5 % SOLN Apply 2 drops to eye as needed (dry eyes).   . clopidogrel (PLAVIX) 75 MG tablet Take 75 mg by mouth daily.  . Cranberry 400 MG CAPS Take 1 capsule by mouth 3 (three) times daily.  Marland Kitchen FLAXSEED, LINSEED, PO Take 15 mLs by mouth.  Marland Kitchen ipratropium-albuterol (DUONEB) 0.5-2.5 (3) MG/3ML SOLN Take 3 mLs by nebulization every 8 (eight) hours as needed (cough).   . MELATONIN-CHAMOMILE PO Take by mouth at bedtime as needed.  . Memantine HCl-Donepezil HCl (NAMZARIC) 28-10 MG CP24 Take 1 capsule at bedtime by mouth.   . Menthol, Topical Analgesic, (BIOFREEZE) 4 % GEL Apply 1 application topically 2 (two) times daily as needed (pain).   . ondansetron (ZOFRAN) 4 MG tablet Take 4 mg by mouth every 8 (eight) hours as needed for nausea or vomiting.  . pantoprazole (PROTONIX) 40 MG tablet Take 1 tablet (40 mg total) by mouth daily.  . polyethylene glycol (MIRALAX / GLYCOLAX) packet Take 17 g by mouth every other day.  . senna-docusate (SENOKOT-S) 8.6-50 MG tablet Take 2 tablets 2 (two) times daily by mouth.   . sertraline (ZOLOFT) 50 MG tablet Take 50 mg by mouth at bedtime.  Marland Kitchen telmisartan (MICARDIS) 20 MG tablet Take 20 mg by mouth daily.  Marland Kitchen triamcinolone (NASACORT) 55 MCG/ACT AERO nasal inhaler Place 2 sprays into the nose at bedtime.  Marland Kitchen guaiFENesin-codeine (ROBITUSSIN AC) 100-10 MG/5ML syrup Take 5 mLs by mouth every 6 (six) hours as needed for cough.  . [DISCONTINUED] ertapenem (INVANZ) 1 g injection Inject 1 g into the muscle daily.   No facility-administered encounter medications on file as of 07/30/2019.     Review of Systems  Unable to perform ROS: Dementia    Immunization History  Administered Date(s) Administered  . Influenza Inj Mdck Quad Pf 08/09/2017  . Influenza, High Dose Seasonal PF 07/31/2016  . Influenza,inj,Quad PF,6+ Mos 08/06/2018  . Influenza-Unspecified 08/01/2010, 07/30/2012, 10/16/2012, 10/16/2013  . Pneumococcal Polysaccharide-23 02/23/2004, 02/21/2013   . Pneumococcal-Unspecified 03/17/2016  . Td 12/02/2008  . Tdap 09/01/2016  . Zoster 03/28/2006  . Zoster Recombinat (Shingrix) 10/29/2017, 01/24/2018   Pertinent  Health Maintenance Due  Topic Date Due  . FOOT EXAM  10/05/1935  . HEMOGLOBIN A1C  03/07/2017  . INFLUENZA VACCINE  05/17/2019  . PNA vac Low Risk Adult  Completed  . OPHTHALMOLOGY EXAM  Discontinued   Fall Risk  01/04/2019 07/17/2018 08/21/2017 04/04/2017 02/26/2017  Falls in the past year? Exclusion - non ambulatory No No No No   Functional Status Survey:    Vitals:   07/30/19 1433  BP: (!) 141/67  Pulse: 62  Resp: (!) 94  Temp: 97.7 F (36.5 C)  Weight: 155 lb 8 oz (70.5 kg)   Body mass index is 23.64 kg/m. Physical Exam Constitutional:  General: He is not in acute distress.    Appearance: He is not diaphoretic.  HENT:     Head: Normocephalic and atraumatic.     Mouth/Throat:     Mouth: Mucous membranes are moist.     Pharynx: Oropharynx is clear. No oropharyngeal exudate.  Eyes:     Conjunctiva/sclera: Conjunctivae normal.     Pupils: Pupils are equal, round, and reactive to light.  Neck:     Thyroid: No thyromegaly.     Vascular: No JVD.     Trachea: No tracheal deviation.  Cardiovascular:     Rate and Rhythm: Normal rate and regular rhythm.     Heart sounds: Murmur present.  Pulmonary:     Effort: Pulmonary effort is normal. No respiratory distress.     Breath sounds: Normal breath sounds. No wheezing.  Abdominal:     General: Bowel sounds are normal. There is no distension.     Palpations: Abdomen is soft.     Tenderness: There is no abdominal tenderness. There is no right CVA tenderness or left CVA tenderness.  Musculoskeletal:     Right lower leg: No edema.     Left lower leg: No edema.  Lymphadenopathy:     Cervical: No cervical adenopathy.  Skin:    General: Skin is warm and dry.  Neurological:     General: No focal deficit present.     Mental Status: He is alert. Mental status is  at baseline.     Comments: No tremor noted. Mild rigidity noted to BUE and BLE.   Psychiatric:        Mood and Affect: Mood normal.     Labs reviewed: Recent Labs    04/07/19 04/08/19 04/11/19  NA 136* 143 141  K 3.3* 3.9 3.3*  BUN 56* 50* 17  CREATININE 1.5* 1.5* 0.8   No results for input(s): AST, ALT, ALKPHOS, BILITOT, PROT, ALBUMIN in the last 8760 hours. Recent Labs    04/04/19 04/08/19  WBC 17.6 13.0  HGB 10.8* 10.8*  HCT 33* 32*  PLT 383 460*   Lab Results  Component Value Date   TSH 3.14 01/18/2018   Lab Results  Component Value Date   HGBA1C 6.6 07/01/2019   Lab Results  Component Value Date   CHOL 127 05/11/2017   HDL 50 05/11/2017   LDLCALC 58 05/11/2017   TRIG 98 05/11/2017    Significant Diagnostic Results in last 30 days:  No results found.  Assessment/Plan  1. Benign prostatic hyperplasia with urinary retention With foley in place.   2. Chronic indwelling Foley catheter Cath care provided by [facility]. Continue to monitor for blood in the urine and fever and report to [facility]. Encourage oral fluid. Continue cranberry supplement.   3. Anemia, unspecified type Lab Results  Component Value Date   HGB 10.8 (A) 04/08/2019  Due to chronic disease. No treatment indicated.    4. Parkinsonism, unspecified Parkinsonism type ([place]) No new issues, continue sinemet Some of his rigidity is likely due to disuse.   5. Controlled type 2 diabetes mellitus without complication, without long-term current use of insulin ([facility]) Controlled. Continue to monitor A1C q 6 months. Carb mod diet.   6. Essential hypertension Improved. Continue Micardis 20 mg qd and Norvasc 10 mg qd  Goal <150/90 due to age/debility   Family/ staff Communication: staff  Labs/tests ordered:  NA

## 2019-08-04 DIAGNOSIS — Z9189 Other specified personal risk factors, not elsewhere classified: Secondary | ICD-10-CM | POA: Diagnosis not present

## 2019-08-11 DIAGNOSIS — Z9189 Other specified personal risk factors, not elsewhere classified: Secondary | ICD-10-CM | POA: Diagnosis not present

## 2019-08-16 DIAGNOSIS — R319 Hematuria, unspecified: Secondary | ICD-10-CM | POA: Diagnosis not present

## 2019-08-16 DIAGNOSIS — Z79899 Other long term (current) drug therapy: Secondary | ICD-10-CM | POA: Diagnosis not present

## 2019-08-16 DIAGNOSIS — N390 Urinary tract infection, site not specified: Secondary | ICD-10-CM | POA: Diagnosis not present

## 2019-08-18 ENCOUNTER — Encounter: Payer: Self-pay | Admitting: Adult Health

## 2019-08-18 ENCOUNTER — Non-Acute Institutional Stay (SKILLED_NURSING_FACILITY): Payer: Medicare Other | Admitting: Adult Health

## 2019-08-18 DIAGNOSIS — Z9189 Other specified personal risk factors, not elsewhere classified: Secondary | ICD-10-CM | POA: Diagnosis not present

## 2019-08-18 DIAGNOSIS — N390 Urinary tract infection, site not specified: Secondary | ICD-10-CM | POA: Diagnosis not present

## 2019-08-18 DIAGNOSIS — B9629 Other Escherichia coli [E. coli] as the cause of diseases classified elsewhere: Secondary | ICD-10-CM

## 2019-08-18 DIAGNOSIS — Z1612 Extended spectrum beta lactamase (ESBL) resistance: Secondary | ICD-10-CM

## 2019-08-18 NOTE — Progress Notes (Signed)
Location:  Occupational psychologist of Service:  SNF (31) Provider:   Cindi Carbon, ANP [place] [phone]   [name], DO  Patient Care Team: [name], DO as PCP - General (Geriatric Medicine) Danella Sensing, MD as Consulting Physician (Dermatology) Sharyne Peach, MD as Consulting Physician (Ophthalmology) Irine Seal, MD as Attending Physician (Urology) [name], MD as Consulting Physician (Neurology)  Extended Emergency Contact Information Primary Emergency Contact: Balkcom,[name] Address: [id number]          [place] [address] [name] of [place] Phone: [phone] Relation: Spouse Secondary Emergency Contact: [name] [place] of [place] Mobile Phone: [phone] Relation: Daughter  Code Status: DNR Goals of care: Advanced Directive information Advanced Directives 12/24/2018  Does Patient Have a Medical Advance Directive? Yes  Type of Paramedic of [place];Living will;Out of facility DNR (pink MOST or yellow form)  Does patient want to make changes to medical advance directive? No - Patient declined  Copy of [place] in Chart? Yes - validated most recent copy scanned in chart (See row information)  Would patient like information on creating a medical advance directive? -  Pre-existing out of facility DNR order (yellow form or pink MOST form) Yellow form placed in chart (order not valid for inpatient use)     Chief Complaint  Patient presents with   Acute Visit    hematuria, confusion, bp low    HPI:  Pt is a 85 y.o. male seen today for an acute visit for hematuria, confusion, and low bp. He has a hx of vascular dementia and is not able to provide a history. The nurse reports that for the past week he has been eating less and more lethargic. He has had blood in his foley catheter which he has due to bph associated urinary retention. He  has a foley induce hypospadias. Takes cranberry for UTI prevention that his family supplies. He did eat an adequate breakfast but prior to that had not been eating and drinking well. BP was 98/55 08/18/2019 Normally he runs in the [id number] range systolic. He denies any abd pain, dysuria, or frequency. He does report some back pain but has this chronically. No fever. His catheter has consistently had purulent drainage and blood. UA C and S grew 100,000 colonies of E coli ESBL obtained on 10/31.  RBC TNTC, WBC TNC, 3+ blood, 3+ bacteria, Nitrite +2 leuk esterase 500, glucose 1, protein 1+, and 3+ bacteria.  He was treated for ESBL with 7 days of ertapenem and improved. Follow up urine culture was negative on 9/23 and so he was taken off contact precautions.   We have tried to avoid checking a UA for him as to avoid over treatment of UTIs and drug resistance. The nurses have been pushing fluid. He continues with blood in the catheter, lethargy and decreased intake.    Past Medical History:  Diagnosis Date   Abnormality of gait    Arthritis    Brain bleed ([place]) 09/01/2016   Cervical spondylosis    Controlled type 2 diabetes mellitus without complication, without long-term current use of insulin ([place]) 06/24/2019   Degenerative joint disease (DJD) of lumbar spine    Dementia ([place]) 04/28/2019   Depression    Diplopia    Dyslipidemia    Essential tremor    Foul smelling urine    Frequent falls    GERD (gastroesophageal reflux disease)  Glycosuria    Hearing difficulty    hearing aids   Hyperlipidemia    Hypertension    Hyperthyroidism    Memory loss    Osteoarthritis    Radiculopathy, lumbar region    Sixth nerve palsy    last  left brain 11/2006  02/1998 08/2002   Small vessel disease ([place])    TIA (transient ischemic attack)    Past Surgical History:  Procedure Laterality Date   APPENDECTOMY     done as a child   GALLBLADDER SURGERY  2008   KNEE ARTHROSCOPY Left      Dr. Hart Robinsons 2002   knee injections Right    Dr. Adriana Mccallum   TONSILLECTOMY     done as a child    No Known Allergies  Outpatient Encounter Medications as of 08/18/2019  Medication Sig   ipratropium-albuterol (DUONEB) 0.5-2.5 (3) MG/3ML SOLN Take 3 mLs by nebulization every 8 (eight) hours as needed (cough).    Memantine HCl-Donepezil HCl (NAMZARIC) 28-10 MG CP24 Take 1 capsule at bedtime by mouth.    Menthol, Topical Analgesic, (BIOFREEZE) 4 % GEL Apply 1 application topically 2 (two) times daily as needed (pain).    ondansetron (ZOFRAN) 4 MG tablet Take 4 mg by mouth every 8 (eight) hours as needed for nausea or vomiting.   pantoprazole (PROTONIX) 40 MG tablet Take 1 tablet (40 mg total) by mouth daily.   polyethylene glycol (MIRALAX / GLYCOLAX) packet Take 17 g by mouth every other day.   senna-docusate (SENOKOT-S) 8.6-50 MG tablet Take 2 tablets 2 (two) times daily by mouth.    sertraline (ZOLOFT) 50 MG tablet Take 50 mg by mouth at bedtime.   telmisartan (MICARDIS) 20 MG tablet Take 20 mg by mouth daily.   triamcinolone (NASACORT) 55 MCG/ACT AERO nasal inhaler Place 2 sprays into the nose at bedtime.   amLODipine (NORVASC) 10 MG tablet Take 1 tablet (10 mg total) by mouth daily.   ammonium lactate (AMLACTIN) 12 % cream Apply 1 Bottle topically at bedtime.    aspirin EC 81 MG tablet Take 81 mg by mouth daily.   carbidopa-levodopa (SINEMET IR) 25-100 MG tablet Take 1.5 tablets by mouth 3 (three) times daily.   carboxymethylcellulose (REFRESH PLUS) 0.5 % SOLN Apply 2 drops to eye as needed (dry eyes).    clopidogrel (PLAVIX) 75 MG tablet Take 75 mg by mouth daily.   Cranberry 400 MG CAPS Take 1 capsule by mouth 3 (three) times daily.   FLAXSEED, LINSEED, PO Take 15 mLs by mouth.   guaiFENesin-codeine (ROBITUSSIN AC) 100-10 MG/5ML syrup Take 5 mLs by mouth every 6 (six) hours as needed for cough.   MELATONIN-CHAMOMILE PO Take by mouth at bedtime as needed.    No facility-administered encounter medications on file as of 08/18/2019.     Review of Systems  Unable to perform ROS: Dementia    Immunization History  Administered Date(s) Administered   Influenza Inj Mdck Quad Pf 08/09/2017   Influenza, High Dose Seasonal PF 07/31/2016, 07/31/2019   Influenza,inj,Quad PF,6+ Mos 08/06/2018   Influenza-Unspecified 08/01/2010, 07/30/2012, 10/16/2012, 10/16/2013   Pneumococcal Polysaccharide-23 02/23/2004, 02/21/2013   Pneumococcal-Unspecified 03/17/2016   Td 12/02/2008   Tdap 09/01/2016   Zoster 03/28/2006   Zoster Recombinat (Shingrix) 10/29/2017, 01/24/2018   Pertinent  Health Maintenance Due  Topic Date Due   FOOT EXAM  10/05/1935   HEMOGLOBIN A1C  12/29/2019   INFLUENZA VACCINE  Completed   PNA vac Low Risk Adult  Completed  OPHTHALMOLOGY EXAM  Discontinued   Fall Risk  01/04/2019 07/17/2018 08/21/2017 04/04/2017 02/26/2017  Falls in the past year? Exclusion - non ambulatory No No No No   Functional Status Survey:    Vitals:   08/18/19 1341  BP: 98/65  Pulse: 66  Resp: 19  Temp: 98.1 F (36.7 C)  SpO2: 96%   There is no height or weight on file to calculate BMI. Physical Exam Vitals signs and nursing note reviewed.  Constitutional:      General: He is not in acute distress.    Appearance: He is not diaphoretic.     Comments: Sleepy but opens his eyes when called   HENT:     Head: Normocephalic and atraumatic.  Neck:     Musculoskeletal: Neck supple.     Thyroid: No thyromegaly.     Vascular: No JVD.     Trachea: No tracheal deviation.  Cardiovascular:     Rate and Rhythm: Normal rate and regular rhythm.     Heart sounds: No murmur.  Pulmonary:     Effort: Pulmonary effort is normal. No respiratory distress.     Breath sounds: Normal breath sounds. No wheezing.  Abdominal:     General: Bowel sounds are normal. There is no distension.     Palpations: Abdomen is soft.     Tenderness: There is no  abdominal tenderness. There is no right CVA tenderness or left CVA tenderness.  Genitourinary:    Comments: Catheter with blood in urine and purulent matter Musculoskeletal:     Right lower leg: No edema.     Left lower leg: No edema.  Lymphadenopathy:     Cervical: No cervical adenopathy.  Skin:    General: Skin is warm and dry.  Neurological:     General: No focal deficit present.     Comments: Rigidity noted to BUE and BLE. No tremor observed. Oriented to self only. Mumbles about going to the dentist (has not been in several months)  Psychiatric:        Mood and Affect: Mood normal.     Labs reviewed: Recent Labs    04/07/19 04/08/19 04/11/19  NA 136* 143 141  K 3.3* 3.9 3.3*  BUN 56* 50* 17  CREATININE 1.5* 1.5* 0.8   No results for input(s): AST, ALT, ALKPHOS, BILITOT, PROT, ALBUMIN in the last 8760 hours. Recent Labs    04/04/19 04/08/19  WBC 17.6 13.0  HGB 10.8* 10.8*  HCT 33* 32*  PLT 383 460*   Lab Results  Component Value Date   TSH 3.14 01/18/2018   Lab Results  Component Value Date   HGBA1C 6.6 07/01/2019   Lab Results  Component Value Date   CHOL 127 05/11/2017   HDL 50 05/11/2017   LDLCALC 58 05/11/2017   TRIG 98 05/11/2017    Significant Diagnostic Results in last 30 days:  No results found.  Assessment/Plan  1. UTI due to extended-spectrum beta lactamase (ESBL) producing Escherichia coli Ertapenem 1 gram IM qd x 10 days Encourage fluids q 2 hours while awake Monitor vitals qshift. BP soft but ok at this time.  Contact isolation  Family/ staff Communication: nurse  Labs/tests ordered:  Check BMP if not abl to drink fluids and bp drops

## 2019-09-01 DIAGNOSIS — Z9189 Other specified personal risk factors, not elsewhere classified: Secondary | ICD-10-CM | POA: Diagnosis not present

## 2019-09-02 DIAGNOSIS — R1312 Dysphagia, oropharyngeal phase: Secondary | ICD-10-CM | POA: Diagnosis not present

## 2019-09-02 DIAGNOSIS — G214 Vascular parkinsonism: Secondary | ICD-10-CM | POA: Diagnosis not present

## 2019-09-05 ENCOUNTER — Non-Acute Institutional Stay (SKILLED_NURSING_FACILITY): Payer: Medicare Other | Admitting: Adult Health

## 2019-09-05 DIAGNOSIS — E119 Type 2 diabetes mellitus without complications: Secondary | ICD-10-CM | POA: Diagnosis not present

## 2019-09-05 DIAGNOSIS — K219 Gastro-esophageal reflux disease without esophagitis: Secondary | ICD-10-CM | POA: Diagnosis not present

## 2019-09-05 DIAGNOSIS — G309 Alzheimer's disease, unspecified: Secondary | ICD-10-CM

## 2019-09-05 DIAGNOSIS — F0280 Dementia in other diseases classified elsewhere without behavioral disturbance: Secondary | ICD-10-CM

## 2019-09-05 DIAGNOSIS — K5901 Slow transit constipation: Secondary | ICD-10-CM

## 2019-09-05 DIAGNOSIS — D508 Other iron deficiency anemias: Secondary | ICD-10-CM

## 2019-09-05 DIAGNOSIS — I679 Cerebrovascular disease, unspecified: Secondary | ICD-10-CM | POA: Diagnosis not present

## 2019-09-05 DIAGNOSIS — Z978 Presence of other specified devices: Secondary | ICD-10-CM

## 2019-09-05 DIAGNOSIS — F0150 Vascular dementia without behavioral disturbance: Secondary | ICD-10-CM

## 2019-09-05 DIAGNOSIS — I10 Essential (primary) hypertension: Secondary | ICD-10-CM | POA: Diagnosis not present

## 2019-09-05 DIAGNOSIS — N401 Enlarged prostate with lower urinary tract symptoms: Secondary | ICD-10-CM

## 2019-09-05 DIAGNOSIS — M1990 Unspecified osteoarthritis, unspecified site: Secondary | ICD-10-CM

## 2019-09-05 DIAGNOSIS — F4321 Adjustment disorder with depressed mood: Secondary | ICD-10-CM

## 2019-09-05 DIAGNOSIS — R338 Other retention of urine: Secondary | ICD-10-CM

## 2019-09-05 DIAGNOSIS — H903 Sensorineural hearing loss, bilateral: Secondary | ICD-10-CM

## 2019-09-06 ENCOUNTER — Encounter: Payer: Self-pay | Admitting: Adult Health

## 2019-09-06 NOTE — Progress Notes (Signed)
Provider:   Cindi Carbon, ANP [place] [phone]  Location: [place]:  SNF (31)   PCP: [name], DO Patient Care Team: [name], DO as PCP - General (Geriatric Medicine) Danella Sensing, MD as Consulting Physician (Dermatology) Sharyne Peach, MD as Consulting Physician (Ophthalmology) Irine Seal, MD as Attending Physician (Urology) [name], MD as Consulting Physician (Neurology)  Extended Emergency Contact Information Primary Emergency Contact: Fonseca,[name] Address: [address]          [place] [address] [name] of [place] Phone: [phone] Relation: Spouse Secondary Emergency Contact: [place] of [place] Mobile Phone: [phone] Relation: Daughter  Code Status:DNR Goals of Care: Advanced Directive information Advanced Directives 09/06/2019  Does Patient Have a Medical Advance Directive? Yes  Type of Advance Directive Out of facility DNR (pink MOST or yellow form);Living will;Healthcare Power of Attorney  Does patient want to make changes to medical advance directive? No - Patient declined  Copy of [place] in Chart? Yes - validated most recent copy scanned in chart (See row information)  Would patient like information on creating a medical advance directive? -  Pre-existing out of facility DNR order (yellow form or pink MOST form) Yellow form placed in chart (order not valid for inpatient use);Pink MOST form placed in chart (order not valid for inpatient use)     Chief Complaint  Patient presents with  . Annual Exam    HPI: Patient is a 85 y.o. male seen today for an annual comprehensive examination.  He has had several acute illnesses this year with recurrent UTI.  Most recently he was treated for ESBL UTI with ertapenem. At this time there are no acute complaints. He has a foley catheter in place due to BPH with clear  urine draining. He has underlying dementia and requires skilled care with assistance for all ADLs and a lift for transfers. Recently his diet was changed to a D3 chopped diet. He is tolerating this well.   In regards to his parkinsonism he has period of increased rigidity to BUE and BLE. No significant tremor or issues with low bp.  He is up to date on all vaccinations. Aged out of any screening measures. Goals of care are comfort based with no hospitalizations.   Past Medical History:  Diagnosis Date  . Abnormality of gait   . Arthritis   . Brain bleed ([place]) 09/01/2016  . Cervical spondylosis   . Controlled type 2 diabetes mellitus without complication, without long-term current use of insulin ([place]) 06/24/2019  . Degenerative joint disease (DJD) of lumbar spine   . Dementia ([place]) 04/28/2019  . Depression   . Diplopia   . Dyslipidemia   . Essential tremor   . Foul smelling urine   . Frequent falls   . GERD (gastroesophageal reflux disease)   . Glycosuria   . Hearing difficulty    hearing aids  . Hyperlipidemia   . Hypertension   . Hyperthyroidism   . Memory loss   . Osteoarthritis   . Radiculopathy, lumbar region   . 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 Small vessel disease ([place])   . TIA (transient ischemic attack)    Past Surgical History:  Procedure Laterality Date  . APPENDECTOMY     done as a child  . GALLBLADDER SURGERY  2008  . KNEE ARTHROSCOPY Left  Dr. Hart Robinsons 2002  . knee injections Right    Dr. Adriana Mccallum  . TONSILLECTOMY     done as a child    reports that he has never smoked. He has never used smokeless tobacco. He reports current alcohol use. He reports that he does not use drugs. Social History   Socioeconomic History  . Marital status: Married    Spouse name: [name]  . Number of children: 4  . Years of education: Not on file  . Highest education level: Not on file  Occupational History  . Occupation: Retired     Comment: [employer]  Social Needs  . Financial resource strain: Not hard at all  . Food insecurity    Worry: Never true    Inability: Never true  . Transportation needs    Medical: No    Non-medical: No  Tobacco Use  . Smoking status: Never Smoker  . Smokeless tobacco: Never Used  Substance and Sexual Activity  . Alcohol use: Yes    Comment: Wife occass/rare  . Drug use: No  . Sexual activity: Not on file  Lifestyle  . Physical activity    Days per week: 0 days    Minutes per session: 0 min  . Stress: Not at all  Relationships  . Social connections    Talks on phone: More than three times a week    Gets together: More than three times a week    Attends religious service: Never    Active member of club or organization: No    Attends meetings of clubs or organizations: Never    Relationship status: Married  Other Topics Concern  . Not on file  Social History Narrative   Tobacco use, amount per day now: NEVER   Past tobacco use, amount per day: never    How many years did you use tobacco:   Alcohol use (drinks per week): OCCASIONAL GLASS OF WINE   Diet:HIGH FIBER, LOW FAT   Do you drink/eat things with caffeine:COFFEE IN THE MORNING   Marital status:    MARRIED                             What year were you married? 1951   Do you live in a house, apartment, assisted living, condo, trailer, etc.? Assisted living    Is it one or more stories?   How many persons live in your home?    Do you have pets in your home?( please list) NO   Highest level of education completed: [place] classes    Current or past profession: [employer].   Do you exercise?    Walking    Type and how often? 3 times a week    Do you have a living will? YES   Do you have a DNR form?  YES                                 If not, do you want to discuss one?   Do you have signed POA/HPOA forms?  YES                      If so, please  bring to you appointment      Has difficulty bathing or dressing self  Has difficulty preparing food    Has difficulty managing medications   Has difficulty managing finances   Does not have any difficulty affording medications    Family History  Problem Relation Age of Onset  . Stroke Mother   . Dementia Mother   . Stroke Father   . Heart disease Father   . Diabetes Father   . Dementia Brother   . Renal Disease Brother   . Diabetes Brother   . Renal Disease Daughter     Pertinent  Health Maintenance Due  Topic Date Due  . FOOT EXAM  10/05/1935  . HEMOGLOBIN A1C  12/29/2019  . INFLUENZA VACCINE  Completed  . PNA vac Low Risk Adult  Completed  . OPHTHALMOLOGY EXAM  Discontinued   Fall Risk  09/06/2019 01/04/2019 07/17/2018 08/21/2017 04/04/2017  Falls in the past year? 1 Exclusion - non ambulatory No No No  Number falls in past yr: 0 - - - -  Injury with Fall? 0 - - - -  Risk for fall due to : History of fall(s) - - - -  Follow up Falls evaluation completed - - - -   Depression screen [facility] 2/9 09/06/2019 01/04/2019 07/17/2018 08/21/2017 04/04/2017  Decreased Interest 0 0 0 0 0  Down, Depressed, Hopeless 0 0 0 1 0  PHQ - 2 Score 0 0 0 1 0  Altered sleeping 0 - - - -  Tired, decreased energy 0 - - - -  Change in appetite 0 - - - -  Feeling bad or failure about yourself  0 - - - -  Trouble concentrating 0 - - - -  Moving slowly or fidgety/restless 0 - - - -  Suicidal thoughts 0 - - - -  PHQ-9 Score 0 - - - -  Difficult doing work/chores Extremely dIfficult - - - -    Functional Status Survey:    No Known Allergies  Outpatient Encounter Medications as of 09/05/2019  Medication Sig  . amLODipine (NORVASC) 10 MG tablet Take 1 tablet (10 mg total) by mouth daily.  Marland Kitchen ammonium lactate (AMLACTIN) 12 % cream Apply 1 Bottle topically at bedtime.   Marland Kitchen aspirin EC 81 MG tablet Take 81 mg by mouth daily.  . carbidopa-levodopa (SINEMET IR) 25-100 MG tablet Take 1.5 tablets by mouth  3 (three) times daily.  . carboxymethylcellulose (REFRESH PLUS) 0.5 % SOLN Apply 2 drops to eye as needed (dry eyes).   . clopidogrel (PLAVIX) 75 MG tablet Take 75 mg by mouth daily.  . Cranberry 400 MG CAPS Take 1 capsule by mouth 3 (three) times daily.  Marland Kitchen FLAXSEED, LINSEED, PO Take 15 mLs by mouth.  Marland Kitchen guaiFENesin-codeine (ROBITUSSIN AC) 100-10 MG/5ML syrup Take 5 mLs by mouth every 6 (six) hours as needed for cough.  Marland Kitchen ipratropium-albuterol (DUONEB) 0.5-2.5 (3) MG/3ML SOLN Take 3 mLs by nebulization every 8 (eight) hours as needed (cough).   . MELATONIN-CHAMOMILE PO Take by mouth at bedtime as needed.  . Memantine HCl-Donepezil HCl (NAMZARIC) 28-10 MG CP24 Take 1 capsule at bedtime by mouth.   . Menthol, Topical Analgesic, (BIOFREEZE) 4 % GEL Apply 1 application topically 2 (two) times daily as needed (pain).   . ondansetron (ZOFRAN) 4 MG tablet Take 4 mg by mouth every 8 (eight) hours as needed for nausea or vomiting.  . pantoprazole (PROTONIX) 40 MG tablet Take 1 tablet (40 mg total) by mouth daily.  . polyethylene glycol (MIRALAX / GLYCOLAX) packet Take 17 g by  mouth every other day.  . senna-docusate (SENOKOT-S) 8.6-50 MG tablet Take 2 tablets 2 (two) times daily by mouth.   . sertraline (ZOLOFT) 50 MG tablet Take 50 mg by mouth at bedtime.  Marland Kitchen telmisartan (MICARDIS) 20 MG tablet Take 20 mg by mouth daily.  Marland Kitchen triamcinolone (NASACORT) 55 MCG/ACT AERO nasal inhaler Place 2 sprays into the nose at bedtime.   No facility-administered encounter medications on file as of 09/05/2019.     Review of Systems  Constitutional: Negative for activity change, appetite change, chills, diaphoresis, fatigue, fever and unexpected weight change.  HENT: Positive for congestion (chronic) and hearing loss.   Eyes: Negative for visual disturbance.  Respiratory: Positive for cough (chronic). Negative for shortness of breath, wheezing and stridor.   Cardiovascular: Negative for chest pain, palpitations and leg  swelling.  Gastrointestinal: Negative for abdominal distention, abdominal pain, constipation and diarrhea.  Genitourinary: Negative for difficulty urinating and dysuria.       Foley  Musculoskeletal: Positive for gait problem. Negative for arthralgias, back pain, joint swelling and myalgias.  Skin: Negative for color change, pallor and rash.  Neurological: Positive for weakness. Negative for dizziness, seizures, syncope, facial asymmetry, speech difficulty and headaches.  Hematological: Negative for adenopathy. Does not bruise/bleed easily.  Psychiatric/Behavioral: Positive for confusion. Negative for agitation and behavioral problems.    Vitals:   09/06/19 0840  Weight: 154 lb 14.4 oz (70.3 kg)   Body mass index is 23.55 kg/m. Physical Exam Vitals signs and nursing note reviewed.  Constitutional:      General: He is not in acute distress.    Appearance: He is not diaphoretic.  HENT:     Head: Normocephalic and atraumatic.     Nose: No congestion.     Mouth/Throat:     Mouth: Mucous membranes are moist.  Eyes:     Conjunctiva/sclera: Conjunctivae normal.     Pupils: Pupils are equal, round, and reactive to light.  Neck:     Musculoskeletal: Neck supple.     Thyroid: No thyromegaly.     Vascular: No JVD.     Trachea: No tracheal deviation.  Cardiovascular:     Rate and Rhythm: Normal rate and regular rhythm.     Heart sounds: No murmur.  Pulmonary:     Effort: Pulmonary effort is normal. No respiratory distress.     Breath sounds: Normal breath sounds. No wheezing.  Abdominal:     General: Bowel sounds are normal. There is no distension.     Palpations: Abdomen is soft.     Tenderness: There is no abdominal tenderness.  Musculoskeletal:        General: No swelling, tenderness, deformity or signs of injury.     Right lower leg: No edema.     Left lower leg: No edema.     Comments: Mild rigidity noted to BUE and BLE  Lymphadenopathy:     Cervical: No cervical  adenopathy.  Skin:    General: Skin is warm and dry.  Neurological:     Mental Status: He is alert. Mental status is at baseline.     Comments: Alert and oriented to self and place but not time. Difficulty following commands for neuro exam but has no obvious facial droop  Psychiatric:        Mood and Affect: Mood normal.     Labs reviewed: Basic Metabolic Panel: Recent Labs    04/07/19 04/08/19 04/11/19  NA 136* 143 141  K 3.3* 3.9 3.3*  BUN 56* 50* 17  CREATININE 1.5* 1.5* 0.8   Liver Function Tests: No results for input(s): AST, ALT, ALKPHOS, BILITOT, PROT, ALBUMIN in the last 8760 hours. No results for input(s): LIPASE, AMYLASE in the last 8760 hours. No results for input(s): AMMONIA in the last 8760 hours. CBC: Recent Labs    04/04/19 04/08/19  WBC 17.6 13.0  HGB 10.8* 10.8*  HCT 33* 32*  PLT 383 460*   Cardiac Enzymes: No results for input(s): CKTOTAL, CKMB, CKMBINDEX, TROPONINI in the last 8760 hours. BNP: Invalid input(s): POCBNP Lab Results  Component Value Date   HGBA1C 6.6 07/01/2019   Lab Results  Component Value Date   TSH 3.14 01/18/2018   Lab Results  Component Value Date   [id number] 12/08/2016   No results found for: FOLATE No results found for: IRON, TIBC, FERRITIN  Imaging and Procedures obtained recently: No results found.  Assessment/Plan 1. Mixed Alzheimer's and vascular dementia ([place]) Progressive decline in cognition and physical function c/w the disease. Continue supportive care in the skilled environment.   2. Essential hypertension Most bps in the 130-140 range with occasional outlier of 160 Continue current regimen  3. Cerebrovascular disease With noted hx of TIA and 6th nerve palsy, Currently on asa and plavix.   4. Gastroesophageal reflux disease without esophagitis Controlled with scheduled protonix 40 mg qd  5. Controlled type 2 diabetes mellitus without complication, without long-term current use of insulin ([facility])  Lab Results  Component Value Date   HGBA1C 6.6 07/01/2019   Goal <8% diet controlled at this time  6. Sensorineural hearing loss (SNHL), bilateral Wears hearing aides  7. Arthritis No new issues or reports of pain  8. Benign prostatic hyperplasia with urinary retention Cath maintenance provided at [facility]. Recurrent infection an issues. Avoid checking urine unless clear criteria met  9. Chronic indwelling Foley catheter As above  10. Other iron deficiency anemia Lab Results  Component Value Date   HGB 10.8 (A) 04/08/2019  Likely due to chronic illness but does have periods of acute blood loss with infection. Iron not indicated at this time. Continue to monitor.    11. Slow transit constipation Controlled, continue miralax 17 grams qod and senokot s 2 tabs bid  12. Situational depression Mood is stable at this time with no s/s of depression. See PHQ 9 score. Would not taper his zoloft at this time due to social isolation from covid 19     Family/ staff Communication: resident and staff  Labs/tests ordered: NA

## 2019-09-09 DIAGNOSIS — Z9189 Other specified personal risk factors, not elsewhere classified: Secondary | ICD-10-CM | POA: Diagnosis not present

## 2019-09-12 DIAGNOSIS — Z9189 Other specified personal risk factors, not elsewhere classified: Secondary | ICD-10-CM | POA: Diagnosis not present

## 2019-09-26 ENCOUNTER — Inpatient Hospital Stay: Admit: 2019-09-26 | Payer: Medicare Other | Admitting: Surgery

## 2019-09-26 SURGERY — DEBRIDEMENT, WOUND, WITH CLOSURE
Anesthesia: General

## 2019-09-29 DIAGNOSIS — Z9189 Other specified personal risk factors, not elsewhere classified: Secondary | ICD-10-CM | POA: Diagnosis not present

## 2019-10-06 DIAGNOSIS — Z9189 Other specified personal risk factors, not elsewhere classified: Secondary | ICD-10-CM | POA: Diagnosis not present

## 2019-10-13 DIAGNOSIS — Z9189 Other specified personal risk factors, not elsewhere classified: Secondary | ICD-10-CM | POA: Diagnosis not present

## 2019-10-13 LAB — NOVEL CORONAVIRUS, NAA: SARS-CoV-2, NAA: NEGATIVE

## 2019-10-16 ENCOUNTER — Encounter: Payer: Self-pay | Admitting: Adult Health

## 2019-10-16 ENCOUNTER — Non-Acute Institutional Stay (SKILLED_NURSING_FACILITY): Payer: Medicare Other | Admitting: Adult Health

## 2019-10-16 DIAGNOSIS — R509 Fever, unspecified: Secondary | ICD-10-CM

## 2019-10-16 DIAGNOSIS — Z20828 Contact with and (suspected) exposure to other viral communicable diseases: Secondary | ICD-10-CM

## 2019-10-16 DIAGNOSIS — Z20822 Contact with and (suspected) exposure to covid-19: Secondary | ICD-10-CM

## 2019-10-16 DIAGNOSIS — R310 Gross hematuria: Secondary | ICD-10-CM

## 2019-10-16 LAB — NOVEL CORONAVIRUS, NAA: SARS-CoV-2, NAA: NEGATIVE

## 2019-10-16 NOTE — Progress Notes (Signed)
Location:  Occupational psychologist of Service:  SNF (31) Provider:   Cindi Carbon, ANP [place] [phone]   [name], DO  Patient Care Team: [name], DO as PCP - General (Geriatric Medicine) Danella Sensing, MD as Consulting Physician (Dermatology) Sharyne Peach, MD as Consulting Physician (Ophthalmology) Irine Seal, MD as Attending Physician (Urology) [name], MD as Consulting Physician (Neurology)  Extended Emergency Contact Information Primary Emergency Contact: Eichel,[name] Address: [id number]          [place] [address] [name] of [place] Phone: [phone] Relation: Spouse Secondary Emergency Contact: [place] of [place] Mobile Phone: [phone] Relation: Daughter  Code Status:  DNR Goals of care: Advanced Directive information Advanced Directives 09/06/2019  Does Patient Have a Medical Advance Directive? Yes  Type of Advance Directive Out of facility DNR (pink MOST or yellow form);Living will;Healthcare Power of Attorney  Does patient want to make changes to medical advance directive? No - Patient declined  Copy of [place] in Chart? Yes - validated most recent copy scanned in chart (See row information)  Would patient like information on creating a medical advance directive? -  Pre-existing out of facility DNR order (yellow form or pink MOST form) Yellow form placed in chart (order not valid for inpatient use);Pink MOST form placed in chart (order not valid for inpatient use)     Chief Complaint  Patient presents with  . Acute Visit    low grade temp cranberry urine    HPI:  Pt is a 85 y.o. male seen today for an acute visit for temp and cranberry urine. The nurse reports that on 12/30 he had temps of 99.6-100.9. on 12/31 he had a temp of 101.5 and received tylenol and is now 95.9 (on a different thermometer).  He also has cranberry colored  urine in his foley bag. 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  He has a hx of dementia and can not provide a history but can answer questions. He denies the following: sore throat, runny nose, cough, sob, cp, abd pain, nausea, diarrhea, loss of taste or smell, or body aches. He does report feeling tired. The nurse confirms the above ROS. He tested negative for covid on 12/28 for routine screening. He has a confirmed close contact with a staff member that tested positive for Covid 19.  He is sleepy but arouses easily per the nurse. He is eating and drinking as usual.     Past Medical History:  Diagnosis Date  . Abnormality of gait   . Arthritis   . Brain bleed ([place]) 09/01/2016  . Cervical spondylosis   . Controlled type 2 diabetes mellitus without complication, without long-term current use of insulin ([place]) 06/24/2019  . Degenerative joint disease (DJD) of lumbar spine   . Dementia ([employer]) 04/28/2019  . Depression   . Diplopia   . Dyslipidemia   . Essential tremor   . Foul smelling urine   . Frequent falls   . GERD (gastroesophageal reflux disease)   . Glycosuria   . Hearing difficulty    hearing aids  . Hyperlipidemia   . Hypertension   . Hyperthyroidism   . Memory loss   . Osteoarthritis   . Radiculopathy, lumbar region   . 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 Small vessel disease (  [place])   . TIA (transient ischemic attack)    Past Surgical History:  Procedure Laterality Date  . APPENDECTOMY     done as a child  . GALLBLADDER SURGERY  2008  . KNEE ARTHROSCOPY Left    Dr. Hart Robinsons 2002  . knee injections Right    Dr. Adriana Mccallum  . TONSILLECTOMY     done as a child    No Known Allergies  Outpatient Encounter Medications as of 10/16/2019  Medication Sig  . amLODipine (NORVASC) 10 MG tablet Take 1 tablet (10 mg total) by mouth daily.  Marland Kitchen ammonium lactate (AMLACTIN) 12 % cream Apply 1 Bottle topically at  bedtime.   Marland Kitchen aspirin EC 81 MG tablet Take 81 mg by mouth daily.  . carbidopa-levodopa (SINEMET IR) 25-100 MG tablet Take 1.5 tablets by mouth 3 (three) times daily.  . carboxymethylcellulose (REFRESH PLUS) 0.5 % SOLN Apply 2 drops to eye as needed (dry eyes).   . clopidogrel (PLAVIX) 75 MG tablet Take 75 mg by mouth daily.  . Cranberry 400 MG CAPS Take 1 capsule by mouth 3 (three) times daily.  Marland Kitchen FLAXSEED, LINSEED, PO Take 15 mLs by mouth.  Marland Kitchen guaiFENesin-codeine (ROBITUSSIN AC) 100-10 MG/5ML syrup Take 5 mLs by mouth every 6 (six) hours as needed for cough.  Marland Kitchen ipratropium-albuterol (DUONEB) 0.5-2.5 (3) MG/3ML SOLN Take 3 mLs by nebulization every 8 (eight) hours as needed (cough).   . MELATONIN-CHAMOMILE PO Take by mouth at bedtime as needed.  . Memantine HCl-Donepezil HCl (NAMZARIC) 28-10 MG CP24 Take 1 capsule at bedtime by mouth.   . Menthol, Topical Analgesic, (BIOFREEZE) 4 % GEL Apply 1 application topically 2 (two) times daily as needed (pain).   . ondansetron (ZOFRAN) 4 MG tablet Take 4 mg by mouth every 8 (eight) hours as needed for nausea or vomiting.  . pantoprazole (PROTONIX) 40 MG tablet Take 1 tablet (40 mg total) by mouth daily.  . polyethylene glycol (MIRALAX / GLYCOLAX) packet Take 17 g by mouth every other day.  . senna-docusate (SENOKOT-S) 8.6-50 MG tablet Take 2 tablets 2 (two) times daily by mouth.   . sertraline (ZOLOFT) 50 MG tablet Take 50 mg by mouth at bedtime.  Marland Kitchen telmisartan (MICARDIS) 20 MG tablet Take 20 mg by mouth daily.  Marland Kitchen triamcinolone (NASACORT) 55 MCG/ACT AERO nasal inhaler Place 2 sprays into the nose at bedtime.   No facility-administered encounter medications on file as of 10/16/2019.    Review of Systems  Constitutional: Positive for fatigue and fever. Negative for activity change, appetite change, chills, diaphoresis and unexpected weight change.  Respiratory: Negative for cough, shortness of breath, wheezing and stridor.   Cardiovascular: Negative for  chest pain, palpitations and leg swelling.  Gastrointestinal: Negative for abdominal distention, abdominal pain, constipation and diarrhea.  Genitourinary: Positive for hematuria. Negative for decreased urine volume, difficulty urinating, discharge, dysuria, flank pain, frequency, penile pain, penile swelling and scrotal swelling.  Musculoskeletal: Positive for gait problem. Negative for arthralgias, back pain, joint swelling and myalgias.  Neurological: Negative for dizziness, seizures, syncope, facial asymmetry, speech difficulty, weakness and headaches.  Hematological: Negative for adenopathy. Does not bruise/bleed easily.  Psychiatric/Behavioral: Positive for confusion. Negative for agitation and behavioral problems.    Immunization History  Administered Date(s) Administered  . Influenza Inj Mdck Quad Pf 08/09/2017  . Influenza, High Dose Seasonal PF 07/31/2016, 07/31/2019  . Influenza,inj,Quad PF,6+ Mos 08/06/2018  . Influenza-Unspecified 08/01/2010, 07/30/2012, 10/16/2012, 10/16/2013  . Pneumococcal Polysaccharide-23 02/23/2004, 02/21/2013  . Pneumococcal-Unspecified  03/17/2016  . Td 12/02/2008  . Tdap 09/01/2016  . Zoster 03/28/2006  . Zoster Recombinat (Shingrix) 10/29/2017, 01/24/2018   Pertinent  Health Maintenance Due  Topic Date Due  . FOOT EXAM  10/05/1935  . HEMOGLOBIN A1C  12/29/2019  . INFLUENZA VACCINE  Completed  . PNA vac Low Risk Adult  Completed  . OPHTHALMOLOGY EXAM  Discontinued   Fall Risk  09/06/2019 01/04/2019 07/17/2018 08/21/2017 04/04/2017  Falls in the past year? 1 Exclusion - non ambulatory No No No  Number falls in past yr: 0 - - - -  Injury with Fall? 0 - - - -  Risk for fall due to : History of fall(s) - - - -  Follow up Falls evaluation completed - - - -   Functional Status Survey:    There were no vitals filed for this visit. There is no height or weight on file to calculate BMI. Physical Exam Vitals and nursing note reviewed.    Constitutional:      General: He is not in acute distress.    Appearance: He is not diaphoretic.     Comments: Sleepy bur easily arouses  HENT:     Head: Normocephalic and atraumatic.  Neck:     Thyroid: No thyromegaly.     Vascular: No JVD.     Trachea: No tracheal deviation.  Cardiovascular:     Rate and Rhythm: Normal rate and regular rhythm.     Heart sounds: No murmur.  Pulmonary:     Effort: Pulmonary effort is normal. No respiratory distress.     Breath sounds: Normal breath sounds. No wheezing.  Abdominal:     General: Bowel sounds are normal. There is no distension.     Palpations: Abdomen is soft.     Tenderness: There is no abdominal tenderness.  Genitourinary:    Comments: Cranberry colored urine in foley bag Musculoskeletal:     Right lower leg: No edema.     Left lower leg: No edema.  Lymphadenopathy:     Cervical: No cervical adenopathy.  Skin:    General: Skin is warm and dry.  Neurological:     General: No focal deficit present.     Mental Status: Mental status is at baseline.     Comments: Oriented x 2  Psychiatric:        Mood and Affect: Mood normal.     Labs reviewed: Recent Labs    04/07/19 0000 04/08/19 0000 04/11/19 0000  NA 136* 143 141  K 3.3* 3.9 3.3*  BUN 56* 50* 17  CREATININE 1.5* 1.5* 0.8   No results for input(s): AST, ALT, ALKPHOS, BILITOT, PROT, ALBUMIN in the last 8760 hours. Recent Labs    04/04/19 0000 04/08/19 0000  WBC 17.6 13.0  HGB 10.8* 10.8*  HCT 33* 32*  PLT 383 460*   Lab Results  Component Value Date   TSH 3.14 01/18/2018   Lab Results  Component Value Date   HGBA1C 6.6 07/01/2019   Lab Results  Component Value Date   CHOL 127 05/11/2017   HDL 50 05/11/2017   LDLCALC 58 05/11/2017   TRIG 98 05/11/2017    Significant Diagnostic Results in last 30 days:  No results found.  Assessment/Plan 1. Fever, unspecified fever cause Possibly due to possible covid 19 vs UTI with hematuria and fever  101.5  2. Gross hematuria Encourage fluids Hold plavix and asa x 72 hrs  Check UA due to hematuria with fever and to  help with the differential diagnosis.   3. Fever with exposure to COVID-19 virus Maintain enhanced isolation Monitor vitals qshift x 72 hrs and monitor for s/s of Covid 19    Family/ staff Communication: discussed with his nurse, will communicate with his wife  Labs/tests ordered:  UA C and S, Covid and flu swab

## 2019-10-17 DIAGNOSIS — R1110 Vomiting, unspecified: Secondary | ICD-10-CM | POA: Diagnosis not present

## 2019-10-17 DIAGNOSIS — N390 Urinary tract infection, site not specified: Secondary | ICD-10-CM | POA: Diagnosis not present

## 2019-10-17 DIAGNOSIS — R319 Hematuria, unspecified: Secondary | ICD-10-CM | POA: Diagnosis not present

## 2019-10-17 DIAGNOSIS — I10 Essential (primary) hypertension: Secondary | ICD-10-CM | POA: Diagnosis not present

## 2019-10-17 DIAGNOSIS — Z1159 Encounter for screening for other viral diseases: Secondary | ICD-10-CM | POA: Diagnosis not present

## 2019-10-18 ENCOUNTER — Encounter: Payer: Self-pay | Admitting: Internal Medicine

## 2019-10-19 DIAGNOSIS — Z9189 Other specified personal risk factors, not elsewhere classified: Secondary | ICD-10-CM | POA: Diagnosis not present

## 2019-10-20 ENCOUNTER — Encounter: Payer: Self-pay | Admitting: Adult Health

## 2019-10-20 ENCOUNTER — Non-Acute Institutional Stay (SKILLED_NURSING_FACILITY): Payer: Medicare Other | Admitting: Adult Health

## 2019-10-20 DIAGNOSIS — R509 Fever, unspecified: Secondary | ICD-10-CM

## 2019-10-20 DIAGNOSIS — R6889 Other general symptoms and signs: Secondary | ICD-10-CM | POA: Diagnosis not present

## 2019-10-20 DIAGNOSIS — H60391 Other infective otitis externa, right ear: Secondary | ICD-10-CM | POA: Diagnosis not present

## 2019-10-20 LAB — NOVEL CORONAVIRUS, NAA: SARS-CoV-2, NAA: POSITIVE

## 2019-10-20 NOTE — Progress Notes (Signed)
Location:  Occupational psychologist of Service:  SNF (31) Provider:   Cindi Carbon, ANP [place] [phone]   [name], DO  Patient Care Team: [name], DO as PCP - General (Geriatric Medicine) Danella Sensing, MD as Consulting Physician (Dermatology) Sharyne Peach, MD as Consulting Physician (Ophthalmology) Irine Seal, MD as Attending Physician (Urology) [name], MD as Consulting Physician (Neurology)  Extended Emergency Contact Information Primary Emergency Contact: Malstrom,[name] Address: [id number]          [place] [address] [name] of [place] Phone: [phone] Relation: Spouse Secondary Emergency Contact: [place] of [place] Mobile Phone: [phone] Relation: Daughter  Code Status:  DNR Goals of care: Advanced Directive information Advanced Directives 09/06/2019  Does Patient Have a Medical Advance Directive? Yes  Type of Advance Directive Out of facility DNR (pink MOST or yellow form);Living will;Healthcare Power of Attorney  Does patient want to make changes to medical advance directive? No - Patient declined  Copy of [place] in Chart? Yes - validated most recent copy scanned in chart (See row information)  Would patient like information on creating a medical advance directive? -  Pre-existing out of facility DNR order (yellow form or pink MOST form) Yellow form placed in chart (order not valid for inpatient use);Pink MOST form placed in chart (order not valid for inpatient use)     Chief Complaint  Patient presents with  . Acute Visit    temp and ear pain    HPI:  Pt is a 86 y.o. male seen today for an acute visit for temp and ear pain. He has had temps ranging 99.6-101.9 since 12/30.  He had a negative covid on 12/28 and 12/31.  He has had exposure through a staff member and resident (eats with someone who tested positive). He has been on  isolation due to the above findings. He was checked for a UTI due to the fever and hematuria noted on 12/31. UA C and S showed  no predominant organism and the hematuria subsided.  Starting this morning he said his right ear hurt. He has not had any drainage but did have his ear irrigated last week for wax. He has not had any worsening hearing loss (which he has chronically and has hearing aids) Currently he denies any sore throat, body aches, loss of taste or smell, fatigue, loss of appetite, sob, cp, etc. However, he has advanced dementia and is a poor historian. His nurse reports that he was weak and not eating well on 1/3 but is slightly improved today. He has a chronic cough that sounds wet at times but no sputum production. This is not changed from his baseline. Sats have been WNL on room air.   He is eating better today and has normal urinary output per staff.   Past Medical History:  Diagnosis Date  . Abnormality of gait   . Arthritis   . Brain bleed ([place]) 09/01/2016  . Cervical spondylosis   . Controlled type 2 diabetes mellitus without complication, without long-term current use of insulin ([place]) 06/24/2019  . Degenerative joint disease (DJD) of lumbar spine   . Dementia ([place]) 04/28/2019  . Depression   . Diplopia   . Dyslipidemia   . Essential tremor   . Foul smelling urine   . Frequent falls   . GERD (gastroesophageal reflux disease)   . Glycosuria   . Hearing difficulty  hearing aids  . Hyperlipidemia   . Hypertension   . Hyperthyroidism   . Memory loss   . Osteoarthritis   . Radiculopathy, lumbar region   . 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 Small vessel disease ([place])   . TIA (transient ischemic attack)    Past Surgical History:  Procedure Laterality Date  . APPENDECTOMY     done as a child  . GALLBLADDER SURGERY  2008  . KNEE ARTHROSCOPY Left    Dr. Hart Robinsons 2002  . knee injections Right    Dr. Adriana Mccallum  . TONSILLECTOMY       done as a child    No Known Allergies  Outpatient Encounter Medications as of 10/20/2019  Medication Sig  . amLODipine (NORVASC) 10 MG tablet Take 1 tablet (10 mg total) by mouth daily.  Marland Kitchen ammonium lactate (AMLACTIN) 12 % cream Apply 1 Bottle topically at bedtime.   Marland Kitchen aspirin EC 81 MG tablet Take 81 mg by mouth daily.  . carbidopa-levodopa (SINEMET IR) 25-100 MG tablet Take 1.5 tablets by mouth 3 (three) times daily.  . carboxymethylcellulose (REFRESH PLUS) 0.5 % SOLN Apply 2 drops to eye as needed (dry eyes).   . clopidogrel (PLAVIX) 75 MG tablet Take 75 mg by mouth daily.  . Cranberry 400 MG CAPS Take 1 capsule by mouth 3 (three) times daily.  Marland Kitchen FLAXSEED, LINSEED, PO Take 15 mLs by mouth.  Marland Kitchen guaiFENesin-codeine (ROBITUSSIN AC) 100-10 MG/5ML syrup Take 5 mLs by mouth every 6 (six) hours as needed for cough.  Marland Kitchen ipratropium-albuterol (DUONEB) 0.5-2.5 (3) MG/3ML SOLN Take 3 mLs by nebulization every 8 (eight) hours as needed (cough).   . MELATONIN-CHAMOMILE PO Take by mouth at bedtime as needed.  . Memantine HCl-Donepezil HCl (NAMZARIC) 28-10 MG CP24 Take 1 capsule at bedtime by mouth.   . Menthol, Topical Analgesic, (BIOFREEZE) 4 % GEL Apply 1 application topically 2 (two) times daily as needed (pain).   . ondansetron (ZOFRAN) 4 MG tablet Take 4 mg by mouth every 8 (eight) hours as needed for nausea or vomiting.  . pantoprazole (PROTONIX) 40 MG tablet Take 1 tablet (40 mg total) by mouth daily.  . polyethylene glycol (MIRALAX / GLYCOLAX) packet Take 17 g by mouth every other day.  . senna-docusate (SENOKOT-S) 8.6-50 MG tablet Take 2 tablets 2 (two) times daily by mouth.   . sertraline (ZOLOFT) 50 MG tablet Take 50 mg by mouth at bedtime.  Marland Kitchen telmisartan (MICARDIS) 20 MG tablet Take 20 mg by mouth daily.  Marland Kitchen triamcinolone (NASACORT) 55 MCG/ACT AERO nasal inhaler Place 2 sprays into the nose at bedtime.   No facility-administered encounter medications on file as of 10/20/2019.    Review of  Systems  Constitutional: Positive for fever. Negative for activity change, appetite change, chills, diaphoresis, fatigue and unexpected weight change.  HENT: Positive for congestion, ear pain and hearing loss. Negative for dental problem, drooling, ear discharge, facial swelling, postnasal drip, rhinorrhea, sinus pressure, sinus pain, sore throat and trouble swallowing.   Respiratory: Positive for cough. Negative for shortness of breath, wheezing and stridor.   Cardiovascular: Negative for chest pain, palpitations and leg swelling.  Gastrointestinal: Negative for abdominal distention, abdominal pain, constipation and diarrhea.  Genitourinary: Negative for difficulty urinating and dysuria.  Musculoskeletal: Positive for gait problem. Negative for arthralgias, back pain, joint swelling and myalgias.  Neurological: Negative for dizziness, seizures, syncope, facial asymmetry, speech difficulty, weakness and headaches.  Hematological: Negative for adenopathy. Does not bruise/bleed easily.  Psychiatric/Behavioral: Positive for confusion. Negative for agitation and behavioral problems.    Immunization History  Administered Date(s) Administered  . Influenza Inj Mdck Quad Pf 08/09/2017  . Influenza, High Dose Seasonal PF 07/31/2016, 07/31/2019  . Influenza,inj,Quad PF,6+ Mos 08/06/2018  . Influenza-Unspecified 08/01/2010, 07/30/2012, 10/16/2012, 10/16/2013  . Pneumococcal Polysaccharide-23 02/23/2004, 02/21/2013  . Pneumococcal-Unspecified 03/17/2016  . Td 12/02/2008  . Tdap 09/01/2016  . Zoster 03/28/2006  . Zoster Recombinat (Shingrix) 10/29/2017, 01/24/2018   Pertinent  Health Maintenance Due  Topic Date Due  . FOOT EXAM  10/05/1935  . HEMOGLOBIN A1C  12/29/2019  . INFLUENZA VACCINE  Completed  . PNA vac Low Risk Adult  Completed  . OPHTHALMOLOGY EXAM  Discontinued   Fall Risk  09/06/2019 01/04/2019 07/17/2018 08/21/2017 04/04/2017  Falls in the past year? 1 Exclusion - non ambulatory No No  No  Number falls in past yr: 0 - - - -  Injury with Fall? 0 - - - -  Risk for fall due to : History of fall(s) - - - -  Follow up Falls evaluation completed - - - -   Functional Status Survey:    Vitals:   10/20/19 1159  BP: 117/65  Pulse: 66  Resp: 17  Temp: 99.9 F (37.7 C)  SpO2: 95%   There is no height or weight on file to calculate BMI. Physical Exam Vitals and nursing note reviewed.  Constitutional:      General: He is not in acute distress.    Appearance: He is not diaphoretic.     Comments: Sleepy but arouses easily  HENT:     Head: Normocephalic and atraumatic.     Right Ear: Tympanic membrane normal.     Left Ear: Tympanic membrane, ear canal and external ear normal.     Ears:     Comments: Erythema and tenderness noted to the right ear canal. No drainage or swelling noted.     Nose: Nose normal. No congestion.     Mouth/Throat:     Mouth: Mucous membranes are moist.     Pharynx: Oropharynx is clear.  Neck:     Thyroid: No thyromegaly.     Vascular: No JVD.     Trachea: No tracheal deviation.  Cardiovascular:     Rate and Rhythm: Normal rate and regular rhythm.     Heart sounds: No murmur.  Pulmonary:     Effort: Pulmonary effort is normal. No respiratory distress.     Breath sounds: Normal breath sounds. No wheezing.  Abdominal:     General: Bowel sounds are normal. There is no distension.     Palpations: Abdomen is soft.     Tenderness: There is no abdominal tenderness. There is no right CVA tenderness or left CVA tenderness.  Musculoskeletal:     Right lower leg: No edema.     Left lower leg: No edema.  Lymphadenopathy:     Cervical: No cervical adenopathy.  Skin:    General: Skin is warm and dry.  Neurological:     General: No focal deficit present.     Mental Status: Mental status is at baseline.  Psychiatric:        Mood and Affect: Mood normal.     Labs reviewed: Recent Labs    04/07/19 0000 04/08/19 0000 04/11/19 0000  NA 136*  143 141  K 3.3* 3.9 3.3*  BUN 56* 50* 17  CREATININE 1.5* 1.5* 0.8  No results for input(s): AST, ALT, ALKPHOS, BILITOT, PROT, ALBUMIN in the last 8760 hours. Recent Labs    04/04/19 0000 04/08/19 0000  WBC 17.6 13.0  HGB 10.8* 10.8*  HCT 33* 32*  PLT 383 460*   Lab Results  Component Value Date   TSH 3.14 01/18/2018   Lab Results  Component Value Date   HGBA1C 6.6 07/01/2019   Lab Results  Component Value Date   CHOL 127 05/11/2017   HDL 50 05/11/2017   LDLCALC 58 05/11/2017   TRIG 98 05/11/2017    Significant Diagnostic Results in last 30 days:  No results found.  Assessment/Plan 1. Fever, unspecified fever cause Due to continued fever with negative urine and neg covid swab will check a CXR.  He should maintain isolation for at least 14 days due to his exposure to a resident and caregiver with covid.   2. Other infective acute otitis externa of right ear Neomycin/polymyxin B/Hydrocortisone otic 4 gtts to the right ear QID x 7 days    Family/ staff Communication: discussed with his nurse  Labs/tests ordered:  CXR, 3rd covid swab pending.

## 2019-10-31 ENCOUNTER — Non-Acute Institutional Stay (SKILLED_NURSING_FACILITY): Payer: Medicare Other | Admitting: Adult Health

## 2019-10-31 ENCOUNTER — Encounter: Payer: Self-pay | Admitting: Adult Health

## 2019-10-31 DIAGNOSIS — U071 COVID-19: Secondary | ICD-10-CM | POA: Diagnosis not present

## 2019-10-31 DIAGNOSIS — E119 Type 2 diabetes mellitus without complications: Secondary | ICD-10-CM | POA: Diagnosis not present

## 2019-10-31 DIAGNOSIS — G309 Alzheimer's disease, unspecified: Secondary | ICD-10-CM | POA: Diagnosis not present

## 2019-10-31 DIAGNOSIS — F0150 Vascular dementia without behavioral disturbance: Secondary | ICD-10-CM

## 2019-10-31 DIAGNOSIS — N401 Enlarged prostate with lower urinary tract symptoms: Secondary | ICD-10-CM

## 2019-10-31 DIAGNOSIS — R338 Other retention of urine: Secondary | ICD-10-CM

## 2019-10-31 DIAGNOSIS — D508 Other iron deficiency anemias: Secondary | ICD-10-CM

## 2019-10-31 DIAGNOSIS — G20 Parkinson's disease: Secondary | ICD-10-CM | POA: Diagnosis not present

## 2019-10-31 DIAGNOSIS — Z978 Presence of other specified devices: Secondary | ICD-10-CM

## 2019-10-31 DIAGNOSIS — F0280 Dementia in other diseases classified elsewhere without behavioral disturbance: Secondary | ICD-10-CM

## 2019-10-31 NOTE — Progress Notes (Signed)
Location:  Occupational psychologist of Service:  SNF (31) Provider:   Cindi Carbon, ANP [place] [phone]   [name], DO  Patient Care Team: [name], DO as PCP - General (Geriatric Medicine) Danella Sensing, MD as Consulting Physician (Dermatology) Sharyne Peach, MD as Consulting Physician (Ophthalmology) Irine Seal, MD as Attending Physician (Urology) [name], MD as Consulting Physician (Neurology)  Extended Emergency Contact Information Primary Emergency Contact: Kowalchuk,[name] Address: [id number]          [place] [address] [name] of [place] Phone: [phone] Relation: Spouse Secondary Emergency Contact: [place] of [place] Mobile Phone: [phone] Relation: Daughter  Code Status:  DNR Goals of care: Advanced Directive information Advanced Directives 09/06/2019  Does Patient Have a Medical Advance Directive? Yes  Type of Advance Directive Out of facility DNR (pink MOST or yellow form);Living will;Healthcare Power of Attorney  Does patient want to make changes to medical advance directive? No - Patient declined  Copy of [place] in Chart? Yes - validated most recent copy scanned in chart (See row information)  Would patient like information on creating a medical advance directive? -  Pre-existing out of facility DNR order (yellow form or pink MOST form) Yellow form placed in chart (order not valid for inpatient use);Pink MOST form placed in chart (order not valid for inpatient use)     Chief Complaint  Patient presents with  . Medical Management of Chronic Issues    HPI:  Pt is a 86 y.o. male seen today for medical management of chronic diseases.    He tested positive on 10/20/19 for covid 19. He had had fever for several days with a negative CXR and UA C and S. He did not require oxygen and only had a mild cough. He was weaker and more lethargic  but this has resolved. Of note he still had a temp of 100.3 on 1/14 and felt some nausea and was given zofran. Today he denies any nausea and his temp is 98.2. He has underlying vascular dementia and at baseline is confused and sleeps frequently but he now easily arouses and is eating well per the nurse. He has a foley cath due to urinary retention associated with BPH. There are no reports of hematuria or other urinary issues.   Past Medical History:  Diagnosis Date  . Abnormality of gait   . Arthritis   . Brain bleed ([place]) 09/01/2016  . Cervical spondylosis   . Controlled type 2 diabetes mellitus without complication, without long-term current use of insulin ([place]) 06/24/2019  . Degenerative joint disease (DJD) of lumbar spine   . Dementia ([place]) 04/28/2019  . Depression   . Diplopia   . Dyslipidemia   . Essential tremor   . Foul smelling urine   . Frequent falls   . GERD (gastroesophageal reflux disease)   . Glycosuria   . Hearing difficulty    hearing aids  . Hyperlipidemia   . Hypertension   . Hyperthyroidism   . Memory loss   . Osteoarthritis   . Radiculopathy, lumbar region   . 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 Small vessel disease ([place])   . TIA (transient ischemic attack)    Past Surgical History:  Procedure Laterality Date  . APPENDECTOMY     done as a child  . GALLBLADDER SURGERY  2008  . KNEE  ARTHROSCOPY Left    Dr. Hart Robinsons 2002  . knee injections Right    Dr. Adriana Mccallum  . TONSILLECTOMY     done as a child    No Known Allergies  Outpatient Encounter Medications as of 10/31/2019  Medication Sig  . amLODipine (NORVASC) 10 MG tablet Take 1 tablet (10 mg total) by mouth daily.  Marland Kitchen ammonium lactate (AMLACTIN) 12 % cream Apply 1 Bottle topically at bedtime.   Marland Kitchen aspirin EC 81 MG tablet Take 81 mg by mouth daily.  . carbidopa-levodopa (SINEMET IR) 25-100 MG tablet Take 1.5 tablets by mouth 3 (three) times daily.  .  carboxymethylcellulose (REFRESH PLUS) 0.5 % SOLN Apply 2 drops to eye as needed (dry eyes).   . clopidogrel (PLAVIX) 75 MG tablet Take 75 mg by mouth daily.  . Cranberry 400 MG CAPS Take 1 capsule by mouth 3 (three) times daily.  Marland Kitchen FLAXSEED, LINSEED, PO Take 15 mLs by mouth.  Marland Kitchen guaiFENesin-codeine (ROBITUSSIN AC) 100-10 MG/5ML syrup Take 5 mLs by mouth every 6 (six) hours as needed for cough.  . MELATONIN-CHAMOMILE PO Take by mouth at bedtime as needed.  . Memantine HCl-Donepezil HCl (NAMZARIC) 28-10 MG CP24 Take 1 capsule at bedtime by mouth.   . Menthol, Topical Analgesic, (BIOFREEZE) 4 % GEL Apply 1 application topically 2 (two) times daily as needed (pain).   . ondansetron (ZOFRAN) 4 MG tablet Take 4 mg by mouth every 8 (eight) hours as needed for nausea or vomiting.  . pantoprazole (PROTONIX) 40 MG tablet Take 1 tablet (40 mg total) by mouth daily.  . polyethylene glycol (MIRALAX / GLYCOLAX) packet Take 17 g by mouth every other day.  . senna-docusate (SENOKOT-S) 8.6-50 MG tablet Take 2 tablets 2 (two) times daily by mouth.   . sertraline (ZOLOFT) 50 MG tablet Take 50 mg by mouth at bedtime.  Marland Kitchen telmisartan (MICARDIS) 20 MG tablet Take 20 mg by mouth daily.  Marland Kitchen triamcinolone (NASACORT) 55 MCG/ACT AERO nasal inhaler Place 2 sprays into the nose at bedtime.  . [DISCONTINUED] ipratropium-albuterol (DUONEB) 0.5-2.5 (3) MG/3ML SOLN Take 3 mLs by nebulization every 8 (eight) hours as needed (cough).    No facility-administered encounter medications on file as of 10/31/2019.    Review of Systems  Constitutional: Negative for activity change, appetite change, chills, diaphoresis, fatigue, fever and unexpected weight change.  Respiratory: Negative for cough, shortness of breath, wheezing and stridor.   Cardiovascular: Negative for chest pain, palpitations and leg swelling.  Gastrointestinal: Negative for abdominal distention, abdominal pain, constipation and diarrhea.  Genitourinary: Negative for  difficulty urinating and dysuria.  Musculoskeletal: Positive for gait problem. Negative for arthralgias, back pain, joint swelling and myalgias.  Neurological: Positive for weakness. Negative for dizziness, seizures, syncope, facial asymmetry, speech difficulty and headaches.  Hematological: Negative for adenopathy. Does not bruise/bleed easily.  Psychiatric/Behavioral: Positive for confusion. Negative for agitation and behavioral problems.    Immunization History  Administered Date(s) Administered  . Influenza Inj Mdck Quad Pf 08/09/2017  . Influenza, High Dose Seasonal PF 07/31/2016, 07/31/2019  . Influenza,inj,Quad PF,6+ Mos 08/06/2018  . Influenza-Unspecified 08/01/2010, 07/30/2012, 10/16/2012, 10/16/2013  . Pneumococcal Polysaccharide-23 02/23/2004, 02/21/2013  . Pneumococcal-Unspecified 03/17/2016  . Td 12/02/2008  . Tdap 09/01/2016  . Zoster 03/28/2006  . Zoster Recombinat (Shingrix) 10/29/2017, 01/24/2018   Pertinent  Health Maintenance Due  Topic Date Due  . HEMOGLOBIN A1C  12/29/2019  . FOOT EXAM  10/30/2020  . INFLUENZA VACCINE  Completed  . PNA vac Low  Risk Adult  Completed  . OPHTHALMOLOGY EXAM  Discontinued   Fall Risk  09/06/2019 01/04/2019 07/17/2018 08/21/2017 04/04/2017  Falls in the past year? 1 Exclusion - non ambulatory No No No  Number falls in past yr: 0 - - - -  Injury with Fall? 0 - - - -  Risk for fall due to : History of fall(s) - - - -  Follow up Falls evaluation completed - - - -   Functional Status Survey:    Vitals:   10/31/19 1346  BP: (!) 159/64  Pulse: 65  Resp: 17  Temp: 98.2 F (36.8 C)  SpO2: 96%  Weight: 155 lb 3.2 oz (70.4 kg)   Body mass index is 23.6 kg/m. Physical Exam Vitals and nursing note reviewed.  Constitutional:      General: He is not in acute distress.    Appearance: He is not diaphoretic.  HENT:     Head: Normocephalic and atraumatic.  Neck:     Thyroid: No thyromegaly.     Vascular: No JVD.     Trachea: No  tracheal deviation.  Cardiovascular:     Rate and Rhythm: Normal rate and regular rhythm.     Pulses:          Dorsalis pedis pulses are 2+ on the right side and 2+ on the left side.       Posterior tibial pulses are 2+ on the right side and 2+ on the left side.     Heart sounds: No murmur.  Pulmonary:     Effort: Pulmonary effort is normal. No respiratory distress.     Breath sounds: Normal breath sounds. No wheezing.  Abdominal:     General: Bowel sounds are normal. There is no distension.     Palpations: Abdomen is soft.     Tenderness: There is no abdominal tenderness.  Genitourinary:    Comments: Clear urine foley bag Musculoskeletal:     Right lower leg: No edema.     Left lower leg: No edema.     Right foot: Normal range of motion. No deformity, bunion, Charcot foot or foot drop.     Left foot: Normal range of motion. No deformity, bunion, Charcot foot or foot drop.  Feet:     Right foot:     Skin integrity: Skin integrity normal.     Left foot:     Skin integrity: Skin integrity normal.     Comments: Blue discoloration to both feet. Not able to check sensation due to dementia Lymphadenopathy:     Cervical: No cervical adenopathy.  Skin:    General: Skin is warm and dry.  Neurological:     General: No focal deficit present.     Mental Status: He is alert. Mental status is at baseline.  Psychiatric:        Mood and Affect: Mood normal.     Labs reviewed: Recent Labs    04/07/19 0000 04/08/19 0000 04/11/19 0000  NA 136* 143 141  K 3.3* 3.9 3.3*  BUN 56* 50* 17  CREATININE 1.5* 1.5* 0.8   No results for input(s): AST, ALT, ALKPHOS, BILITOT, PROT, ALBUMIN in the last 8760 hours. Recent Labs    04/04/19 0000 04/08/19 0000  WBC 17.6 13.0  HGB 10.8* 10.8*  HCT 33* 32*  PLT 383 460*   Lab Results  Component Value Date   TSH 3.14 01/18/2018   Lab Results  Component Value Date   HGBA1C 6.6 07/01/2019  Lab Results  Component Value Date   CHOL 127  05/11/2017   HDL 50 05/11/2017   LDLCALC 58 05/11/2017   TRIG 98 05/11/2017    Significant Diagnostic Results in last 30 days:  No results found.  Assessment/Plan 1. COVID-19 virus infection Continues to recover with low grade temp and nausea on 1/14. Continue isolation on Sk 3 at this time.   2. Mixed Alzheimer's and vascular dementia ([place]) Has had progression in dementia in the past year.  Needs to be fed now. Continues on Namzaric and is followed by Dr Jannifer Franklin. Also takes plavix and asa due to prior hx of TIA  3. Controlled type 2 diabetes mellitus without complication, without long-term current use of insulin ([place]) Foot exam completed.  Lab Results  Component Value Date   HGBA1C 6.6 07/01/2019  Check A1C again in Feb.  Diet controlled   4. Parkinsonism, unspecified Parkinsonism type ([place]) Continues on sinemet prescribed by Dr. Jannifer Franklin with no new symptoms   5. Benign prostatic hyperplasia with urinary retention Has foley, no new issues   6. Chronic indwelling Foley catheter Staff to provide cath changes each month and monitor urinary output   7. Other iron deficiency anemia Lab Results  Component Value Date   HGB 10.8 (A) 04/08/2019  Not currently on meds Check annually.      Family/ staff Communication: discussed with his nurse   Labs/tests ordered: NA

## 2019-11-03 ENCOUNTER — Ambulatory Visit: Payer: Medicare Other | Admitting: Neurology

## 2019-11-03 NOTE — Progress Notes (Deleted)
PATIENT: Kenneth Stark DOB: 1925/10/02  REASON FOR VISIT: follow up HISTORY FROM: patient  HISTORY OF PRESENT ILLNESS: Today 11/03/19  HISTORY 04/28/2019 Dr. Donnamarie Poag Stark is a 86 year old right-handed white male with a history of Parkinson's disease, the patient essentially is nonambulatory.  He has developed a progressive dementing illness, he is on Namzeric.  He is having occasional hallucinations according to nursing staff, there is no associated agitation with this.  He has not had any falls, he does not try to get up out of bed on his own.  He recently had some problems with dehydration, over the last week they have reported increased problems with lethargy, they are having trouble getting him to eat and drink because of his sleepiness.  The patient is taking his medications well however.  He has had some progressive problems with memory.  REVIEW OF SYSTEMS: Out of a complete 14 system review of symptoms, the patient complains only of the following symptoms, and all other reviewed systems are negative.  ALLERGIES: No Known Allergies  HOME MEDICATIONS: Outpatient Medications Prior to Visit  Medication Sig Dispense Refill  . amLODipine (NORVASC) 10 MG tablet Take 1 tablet (10 mg total) by mouth daily. 90 tablet 3  . ammonium lactate (AMLACTIN) 12 % cream Apply 1 Bottle topically at bedtime.     Marland Kitchen aspirin EC 81 MG tablet Take 81 mg by mouth daily.    . carbidopa-levodopa (SINEMET IR) 25-100 MG tablet Take 1.5 tablets by mouth 3 (three) times daily. 135 tablet 3  . carboxymethylcellulose (REFRESH PLUS) 0.5 % SOLN Apply 2 drops to eye as needed (dry eyes).     . clopidogrel (PLAVIX) 75 MG tablet Take 75 mg by mouth daily.    . Cranberry 400 MG CAPS Take 1 capsule by mouth 3 (three) times daily.    Marland Kitchen FLAXSEED, LINSEED, PO Take 15 mLs by mouth.    Marland Kitchen guaiFENesin-codeine (ROBITUSSIN AC) 100-10 MG/5ML syrup Take 5 mLs by mouth every 6 (six) hours as needed for cough.    .  MELATONIN-CHAMOMILE PO Take by mouth at bedtime as needed.    . Memantine HCl-Donepezil HCl (NAMZARIC) 28-10 MG CP24 Take 1 capsule at bedtime by mouth.     . Menthol, Topical Analgesic, (BIOFREEZE) 4 % GEL Apply 1 application topically 2 (two) times daily as needed (pain).     . ondansetron (ZOFRAN) 4 MG tablet Take 4 mg by mouth every 8 (eight) hours as needed for nausea or vomiting.    . pantoprazole (PROTONIX) 40 MG tablet Take 1 tablet (40 mg total) by mouth daily. 30 tablet 1  . polyethylene glycol (MIRALAX / GLYCOLAX) packet Take 17 g by mouth every other day.    . senna-docusate (SENOKOT-S) 8.6-50 MG tablet Take 2 tablets 2 (two) times daily by mouth.     . sertraline (ZOLOFT) 50 MG tablet Take 50 mg by mouth at bedtime.    Marland Kitchen telmisartan (MICARDIS) 20 MG tablet Take 20 mg by mouth daily.    Marland Kitchen triamcinolone (NASACORT) 55 MCG/ACT AERO nasal inhaler Place 2 sprays into the nose at bedtime.     No facility-administered medications prior to visit.    PAST MEDICAL HISTORY: Past Medical History:  Diagnosis Date  . Abnormality of gait   . Arthritis   . Brain bleed ([place]) 09/01/2016  . Cervical spondylosis   . Controlled type 2 diabetes mellitus without complication, without long-term current use of insulin ([place]) 06/24/2019  . Degenerative joint disease (  DJD) of lumbar spine   . Dementia ([place]) 04/28/2019  . Depression   . Diplopia   . Dyslipidemia   . Essential tremor   . Foul smelling urine   . Frequent falls   . GERD (gastroesophageal reflux disease)   . Glycosuria   . Hearing difficulty    hearing aids  . Hyperlipidemia   . Hypertension   . Hyperthyroidism   . Memory loss   . Osteoarthritis   . Radiculopathy, lumbar region   . 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 Small vessel disease ([place])   . TIA (transient ischemic attack)     PAST SURGICAL HISTORY: Past Surgical History:  Procedure Laterality Date  . APPENDECTOMY     done as a child  .  GALLBLADDER SURGERY  2008  . KNEE ARTHROSCOPY Left    Dr. Hart Robinsons 2002  . knee injections Right    Dr. Adriana Mccallum  . TONSILLECTOMY     done as a child    FAMILY HISTORY: Family History  Problem Relation Age of Onset  . Stroke Mother   . Dementia Mother   . Stroke Father   . Heart disease Father   . Diabetes Father   . Dementia Brother   . Renal Disease Brother   . Diabetes Brother   . Renal Disease Daughter     SOCIAL HISTORY: Social History   Socioeconomic History  . Marital status: Married    Spouse name: [name]  . Number of children: 4  . Years of education: Not on file  . Highest education level: Not on file  Occupational History  . Occupation: Retired    Comment: [employer]  Tobacco Use  . Smoking status: Never Smoker  . Smokeless tobacco: Never Used  Substance and Sexual Activity  . Alcohol use: Yes    Comment: Wife occass/rare  . Drug use: No  . Sexual activity: Not on file  Other Topics Concern  . Not on file  Social History Narrative   Tobacco use, amount per day now: NEVER   Past tobacco use, amount per day: never    How many years did you use tobacco:   Alcohol use (drinks per week): OCCASIONAL GLASS OF WINE   Diet:HIGH FIBER, LOW FAT   Do you drink/eat things with caffeine:COFFEE IN THE MORNING   Marital status:    MARRIED                             What year were you married? 1951   Do you live in a house, apartment, assisted living, condo, trailer, etc.? Assisted living    Is it one or more stories?   How many persons live in your home?    Do you have pets in your home?( please list) NO   Highest level of education completed: [employer] classes    Current or past profession: [employer].   Do you exercise?    Walking    Type and how often? 3 times a week    Do you have a living will? YES   Do you have a DNR form?  YES                                 If  not, do you  want to discuss one?   Do you have signed POA/HPOA forms?  YES                      If so, please bring to you appointment      Has difficulty bathing or dressing self   Has difficulty preparing food    Has difficulty managing medications   Has difficulty managing finances   Does not have any difficulty affording medications    Social Determinants of Health   Financial Resource Strain:   . Difficulty of Paying Living Expenses: Not on file  Food Insecurity:   . Worried About Charity fundraiser in the Last Year: Not on file  . Ran Out of Food in the Last Year: Not on file  Transportation Needs:   . Lack of Transportation (Medical): Not on file  . Lack of Transportation (Non-Medical): Not on file  Physical Activity:   . Days of Exercise per Week: Not on file  . Minutes of Exercise per Session: Not on file  Stress:   . Feeling of Stress : Not on file  Social Connections:   . Frequency of Communication with Friends and Family: Not on file  . Frequency of Social Gatherings with Friends and Family: Not on file  . Attends Religious Services: Not on file  . Active Member of Clubs or Organizations: Not on file  . Attends Archivist Meetings: Not on file  . Marital Status: Not on file  Intimate Partner Violence:   . Fear of Current or Ex-Partner: Not on file  . Emotionally Abused: Not on file  . Physically Abused: Not on file  . Sexually Abused: Not on file      PHYSICAL EXAM  There were no vitals filed for this visit. There is no height or weight on file to calculate BMI.  Generalized: Well developed, in no acute distress   Neurological examination  Mentation: Alert oriented to time, place, history taking. Follows all commands speech and language fluent Cranial nerve II-XII: Pupils were equal round reactive to light. Extraocular movements were full, visual field were full on confrontational test. Facial sensation and strength were normal. Uvula tongue midline. Head  turning and shoulder shrug  were normal and symmetric. Motor: The motor testing reveals 5 over 5 strength of all 4 extremities. Good symmetric motor tone is noted throughout.  Sensory: Sensory testing is intact to soft touch on all 4 extremities. No evidence of extinction is noted.  Coordination: Cerebellar testing reveals good finger-nose-finger and heel-to-shin bilaterally.  Gait and station: Gait is normal. Tandem gait is normal. Romberg is negative. No drift is seen.  Reflexes: Deep tendon reflexes are symmetric and normal bilaterally.   DIAGNOSTIC DATA (LABS, IMAGING, TESTING) - I reviewed patient records, labs, notes, testing and imaging myself where available.  Lab Results  Component Value Date   WBC 13.0 04/08/2019   HGB 10.8 (A) 04/08/2019   HCT 32 (A) 04/08/2019   MCV 93.0 11/29/2017   PLT 460 (A) 04/08/2019      Component Value Date/Time   NA 141 04/11/2019 0000   K 3.3 (A) 04/11/2019 0000   CL 110 11/29/2017 0608   CO2 24 11/29/2017 0608   GLUCOSE 77 11/29/2017 0608   BUN 17 04/11/2019 0000   CREATININE 0.8 04/11/2019 0000   CREATININE 0.85 11/29/2017 0608   CALCIUM 8.3 (L) 11/29/2017 0608   PROT 6.3 (L) 11/28/2017 0026  ALBUMIN 3.1 (L) 11/28/2017 0026   AST 25 11/28/2017 0026   ALT 9 (L) 11/28/2017 0026   ALKPHOS 61 11/28/2017 0026   BILITOT 0.7 11/28/2017 0026   GFRNONAA >60 11/29/2017 0608   GFRAA >60 11/29/2017 0608   Lab Results  Component Value Date   CHOL 127 05/11/2017   HDL 50 05/11/2017   LDLCALC 58 05/11/2017   TRIG 98 05/11/2017   Lab Results  Component Value Date   HGBA1C 6.6 07/01/2019   Lab Results  Component Value Date   [id number] 12/08/2016   Lab Results  Component Value Date   TSH 3.14 01/18/2018      ASSESSMENT AND PLAN 86 y.o. year old male  has a past medical history of Abnormality of gait, Arthritis, Brain bleed ([place]) (09/01/2016), Cervical spondylosis, Controlled type 2 diabetes mellitus without complication,  without long-term current use of insulin ([place]) (06/24/2019), Degenerative joint disease (DJD) of lumbar spine, Dementia ([place]) (04/28/2019), Depression, Diplopia, Dyslipidemia, Essential tremor, Foul smelling urine, Frequent falls, GERD (gastroesophageal reflux disease), Glycosuria, Hearing difficulty, Hyperlipidemia, Hypertension, Hyperthyroidism, Memory loss, Osteoarthritis, Radiculopathy, lumbar region, Sixth nerve palsy, Small vessel disease ([place]), and TIA (transient ischemic attack). here with ***   I spent 15 minutes with the patient. 50% of this time was spent   Butler Denmark, [address] [facility] [place], DNP 11/03/2019, 5:49 AM [facility] [address], [place], [place] [address] [phone]

## 2019-11-14 ENCOUNTER — Non-Acute Institutional Stay (SKILLED_NURSING_FACILITY): Payer: Medicare Other | Admitting: Adult Health

## 2019-11-14 ENCOUNTER — Encounter: Payer: Self-pay | Admitting: Adult Health

## 2019-11-14 DIAGNOSIS — H109 Unspecified conjunctivitis: Secondary | ICD-10-CM

## 2019-11-14 DIAGNOSIS — B9689 Other specified bacterial agents as the cause of diseases classified elsewhere: Secondary | ICD-10-CM

## 2019-11-14 NOTE — Progress Notes (Signed)
Location:  [facility]up[facility] psychologist of Service:  SNF (31) Provider:   Cindi Carbon, ANP [place] [phone]   [name], DO  Patient Care Team: [name], DO as PCP - General (Geriatric Medicine) Danella Sensing, MD as Consulting Physician (Dermatology) Sharyne Peach, MD as Consulting Physician (Ophthalmology) Irine Seal, MD as Attending Physician (Urology) [name], MD as Consulting Physician (Neurology)  Extended Emergency Contact Information Primary Emergency Contact: Kenneth Stark Address: [id number]          [place] [address] Kenneth Stark of [place] Phone: [phone] Relation: Spouse Secondary Emergency Contact: [place] Mobile Phone: [phone] Relation: Daughter  Code Status:  DNR Goals of care: Advanced Directive information Advanced Directives 09/06/2019  Does Patient Have a Medical Advance Directive? Yes  Type of Advance Directive Out of facility DNR (pink MOST or yellow form);Living will;Healthcare Power of Attorney  Does patient want to make changes to medical advance directive? No - Patient declined  Copy of [place] in Chart? Yes - validated most recent copy scanned in chart (See row information)  Would patient like information on creating a medical advance directive? -  Pre-existing out of facility DNR order (yellow form or pink MOST form) Yellow form placed in chart (order not valid for inpatient use);Pink MOST form placed in chart (order not valid for inpatient use)     Chief Complaint  Patient presents with  . Acute Visit    purulent eye drainage    HPI:  Pt is a 86 y.o. male seen today for an acute visit for green drainage from both eyes for the past 3-4 days. The resident has been keeping his eyes closed and reports itching and discomfort to both eyes but no pain. Temp 98.5, was 99.9 on 1/28.  No cough, sob, low 02 sats, etc.  Good appetite. Tested pos for covid on 10/20/19.  He has dementia and is a poor historian but denies change to his vision.    Past Medical History:  Diagnosis Date  . Abnormality of gait   . Arthritis   . Brain bleed ([place]) 09/01/2016  . Cervical spondylosis   . Controlled type 2 diabetes mellitus without complication, without long-term current use of insulin ([place]) 06/24/2019  . Degenerative joint disease (DJD) of lumbar spine   . Dementia ([place]) 04/28/2019  . Depression   . Diplopia   . Dyslipidemia   . Essential tremor   . Foul smelling urine   . Frequent falls   . GERD (gastroesophageal reflux disease)   . Glycosuria   . Hearing difficulty    hearing aids  . Hyperlipidemia   . Hypertension   . Hyperthyroidism   . Memory loss   . Osteoarthritis   . Radiculopathy, lumbar region   . 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 Small vessel disease ([place])   . TIA (transient ischemic attack)    Past Surgical History:  Procedure Laterality Date  . APPENDECTOMY     done as a child  . GALLBLADDER SURGERY  2008  . KNEE ARTHROSCOPY Left    Dr. Hart Robinsons 2002  . knee injections Right    Dr. Adriana Mccallum  . TONSILLECTOMY     done as a child    No Known Allergies  Outpatient Encounter Medications as of 11/14/2019  Medication Sig  . amLODipine (NORVASC) 10 MG tablet Take  1 tablet (10 mg total) by mouth daily.  Marland Kitchen ammonium lactate (AMLACTIN) 12 % cream Apply 1 Bottle topically at bedtime.   Marland Kitchen aspirin EC 81 MG tablet Take 81 mg by mouth daily.  . carbidopa-levodopa (SINEMET IR) 25-100 MG tablet Take 1.5 tablets by mouth 3 (three) times daily.  . carboxymethylcellulose (REFRESH PLUS) 0.5 % SOLN Apply 2 drops to eye as needed (dry eyes).   . clopidogrel (PLAVIX) 75 MG tablet Take 75 mg by mouth daily.  . Cranberry 400 MG CAPS Take 1 capsule by mouth 3 (three) times daily.  Marland Kitchen FLAXSEED, LINSEED, PO Take 15 mLs by mouth.  Marland Kitchen guaiFENesin-codeine (ROBITUSSIN AC) 100-10  MG/5ML syrup Take 5 mLs by mouth every 6 (six) hours as needed for cough.  . MELATONIN-CHAMOMILE PO Take by mouth at bedtime as needed.  . Memantine HCl-Donepezil HCl (NAMZARIC) 28-10 MG CP24 Take 1 capsule at bedtime by mouth.   . Menthol, Topical Analgesic, (BIOFREEZE) 4 % GEL Apply 1 application topically 2 (two) times daily as needed (pain).   . ondansetron (ZOFRAN) 4 MG tablet Take 4 mg by mouth every 8 (eight) hours as needed for nausea or vomiting.  . pantoprazole (PROTONIX) 40 MG tablet Take 1 tablet (40 mg total) by mouth daily.  . polyethylene glycol (MIRALAX / GLYCOLAX) packet Take 17 g by mouth every other day.  . senna-docusate (SENOKOT-S) 8.6-50 MG tablet Take 2 tablets 2 (two) times daily by mouth.   . sertraline (ZOLOFT) 50 MG tablet Take 50 mg by mouth at bedtime.  Marland Kitchen telmisartan (MICARDIS) 20 MG tablet Take 20 mg by mouth daily.  Marland Kitchen triamcinolone (NASACORT) 55 MCG/ACT AERO nasal inhaler Place 2 sprays into the nose at bedtime.   No facility-administered encounter medications on file as of 11/14/2019.    Review of Systems  Immunization History  Administered Date(s) Administered  . Influenza Inj Mdck Quad Pf 08/09/2017  . Influenza, High Dose Seasonal PF 07/31/2016, 07/31/2019  . Influenza,inj,Quad PF,6+ Mos 08/06/2018  . Influenza-Unspecified 08/01/2010, 07/30/2012, 10/16/2012, 10/16/2013  . Pneumococcal Polysaccharide-23 02/23/2004, 02/21/2013  . Pneumococcal-Unspecified 03/17/2016  . Td 12/02/2008  . Tdap 09/01/2016  . Zoster 03/28/2006  . Zoster Recombinat (Shingrix) 10/29/2017, 01/24/2018   Pertinent  Health Maintenance Due  Topic Date Due  . HEMOGLOBIN A1C  12/29/2019  . FOOT EXAM  10/30/2020  . INFLUENZA VACCINE  Completed  . PNA vac Low Risk Adult  Completed  . OPHTHALMOLOGY EXAM  Discontinued   Fall Risk  09/06/2019 01/04/2019 07/17/2018 08/21/2017 04/04/2017  Falls in the past year? 1 Exclusion - non ambulatory No No No  Number falls in past yr: 0 - - - -    Injury with Fall? 0 - - - -  Risk for fall due to : History of fall(s) - - - -  Follow up Falls evaluation completed - - - -   Functional Status Survey:    There were no vitals filed for this visit. There is no height or weight on file to calculate BMI. Physical Exam Constitutional:      General: He is not in acute distress.    Appearance: He is not diaphoretic.  HENT:     Head: Normocephalic and atraumatic.  Eyes:     General: No scleral icterus.       Right eye: Discharge present.        Left eye: Discharge present.    Conjunctiva/sclera:     Right eye: Right conjunctiva is injected. Chemosis and exudate  present. No hemorrhage.    Left eye: Left conjunctiva is injected. Chemosis and exudate present. No hemorrhage.    Pupils: Pupils are equal, round, and reactive to light.     Comments: He refused to open his eyes for EOM exam.  No ciliary flush noted  Neck:     Thyroid: No thyromegaly.     Vascular: No JVD.     Trachea: No tracheal deviation.  Cardiovascular:     Rate and Rhythm: Normal rate and regular rhythm.     Heart sounds: No murmur.  Pulmonary:     Effort: Pulmonary effort is normal. No respiratory distress.     Breath sounds: Normal breath sounds. No wheezing.  Abdominal:     General: Bowel sounds are normal. There is no distension.     Palpations: Abdomen is soft.     Tenderness: There is no abdominal tenderness.  Lymphadenopathy:     Cervical: No cervical adenopathy.  Skin:    General: Skin is warm and dry.  Neurological:     General: No focal deficit present.     Mental Status: He is alert. Mental status is at baseline.     Labs reviewed: Recent Labs    04/07/19 0000 04/08/19 0000 04/11/19 0000  NA 136* 143 141  K 3.3* 3.9 3.3*  BUN 56* 50* 17  CREATININE 1.5* 1.5* 0.8   No results for input(s): AST, ALT, ALKPHOS, BILITOT, PROT, ALBUMIN in the last 8760 hours. Recent Labs    04/04/19 0000 04/08/19 0000  WBC 17.6 13.0  HGB 10.8* 10.8*   HCT 33* 32*  PLT 383 460*   Lab Results  Component Value Date   TSH 3.14 01/18/2018   Lab Results  Component Value Date   HGBA1C 6.6 07/01/2019   Lab Results  Component Value Date   CHOL 127 05/11/2017   HDL 50 05/11/2017   LDLCALC 58 05/11/2017   TRIG 98 05/11/2017    Significant Diagnostic Results in last 30 days:  No results found.  Assessment/Plan 1. Bacterial conjunctivitis of both eyes Likely started viral due to covid and has progressed to bacterial with copious purulent drainage Erythromycin 0.5% 0.5 in 4 times daily x 7 days to both eyes   Family/ staff Communication: discussed with his nurse  Labs/tests ordered:  NA

## 2019-11-24 ENCOUNTER — Encounter: Payer: Self-pay | Admitting: Adult Health

## 2019-11-24 ENCOUNTER — Non-Acute Institutional Stay (SKILLED_NURSING_FACILITY): Payer: Medicare Other | Admitting: Adult Health

## 2019-11-24 DIAGNOSIS — R338 Other retention of urine: Secondary | ICD-10-CM

## 2019-11-24 DIAGNOSIS — I10 Essential (primary) hypertension: Secondary | ICD-10-CM | POA: Diagnosis not present

## 2019-11-24 DIAGNOSIS — E119 Type 2 diabetes mellitus without complications: Secondary | ICD-10-CM | POA: Diagnosis not present

## 2019-11-24 DIAGNOSIS — B308 Other viral conjunctivitis: Secondary | ICD-10-CM

## 2019-11-24 DIAGNOSIS — U071 COVID-19: Secondary | ICD-10-CM

## 2019-11-24 DIAGNOSIS — F4321 Adjustment disorder with depressed mood: Secondary | ICD-10-CM

## 2019-11-24 DIAGNOSIS — N401 Enlarged prostate with lower urinary tract symptoms: Secondary | ICD-10-CM

## 2019-11-24 DIAGNOSIS — Z978 Presence of other specified devices: Secondary | ICD-10-CM

## 2019-11-24 NOTE — Progress Notes (Signed)
Location:  Occup[facility] psychologist of Service:  SNF (31) Provider:   Cindi Carbon, ANP [place] [phone]   [name], DO  Patient Care Team: [name], DO as PCP - General (Geriatric Medicine) Danella Sensing, MD as Consulting Physician (Dermatology) Sharyne Peach, MD as Consulting Physician (Ophthalmology) Irine Seal, MD as Attending Physician (Urology) [name], MD as Consulting Physician (Neurology)  Extended Emergency Contact Information Primary Emergency Contact: Sferrazza,[name] Address: [id number]          [place] [address] [name] of [place] Phone: [phone] Relation: Spouse Secondary Emergency Contact: [place] of [place] Mobile Phone: [phone] Relation: Daughter  Code Status: DNR Goals of care: Advanced Directive information Advanced Directives 09/06/2019  Does Patient Have a Medical Advance Directive? Yes  Type of Advance Directive Out of facility DNR (pink MOST or yellow form);Living will;Healthcare Power of Attorney  Does patient want to make changes to medical advance directive? No - Patient declined  Copy of [place] in Chart? Yes - validated most recent copy scanned in chart (See row information)  Would patient like information on creating a medical advance directive? -  Pre-existing out of facility DNR order (yellow form or pink MOST form) Yellow form placed in chart (order not valid for inpatient use);Pink MOST form placed in chart (order not valid for inpatient use)     Chief Complaint  Patient presents with  . Medical Management of Chronic Issues    HPI:  Pt is a 86 y.o. male seen today for medical management of chronic diseases.     He was treated with erythromycin x 1 week on 1/29 for bacterial conjunctivitis, which likely started as viral after covid.  The purulent drainage improved however he continues with discomfort and  redness on the left eye. He scratches the area frequently and has to be told not to do so. His nurse is applying refresh which seems to help. He says it is "uncomfortable" but not significantly painful. No change of vision. He wears glasses and keeps his eyes closed.   He has recovered from Covid 19 after a mild to moderate course. He is not on oxygen and has no cough or sob.  He has a catheter in place due to bph with retention.  No issus with hematuria, fever, or bladder pain  Are present  He has a hx of diabetes that is diet controlled.  Lab Results  Component Value Date   HGBA1C 6.6 07/01/2019     Past Medical History:  Diagnosis Date  . Abnormality of gait   . Arthritis   . Brain bleed ([place]) 09/01/2016  . Cervical spondylosis   . Controlled type 2 diabetes mellitus without complication, without long-term current use of insulin ([place]) 06/24/2019  . Degenerative joint disease (DJD) of lumbar spine   . Dementia ([place]) 04/28/2019  . Depression   . Diplopia   . Dyslipidemia   . Essential tremor   . Foul smelling urine   . Frequent falls   . GERD (gastroesophageal reflux disease)   . Glycosuria   . Hearing difficulty    hearing aids  . Hyperlipidemia   . Hypertension   . Hyperthyroidism   . Memory loss   . Osteoarthritis   . Radiculopathy, lumbar region   . 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 Small vessel disease ([place])   . TIA (  transient ischemic attack)    Past Surgical History:  Procedure Laterality Date  . APPENDECTOMY     done as a child  . GALLBLADDER SURGERY  2008  . KNEE ARTHROSCOPY Left    Dr. Hart Robinsons 2002  . knee injections Right    Dr. Adriana Mccallum  . TONSILLECTOMY     done as a child    No Known Allergies  Outpatient Encounter Medications as of 11/24/2019  Medication Sig  . amLODipine (NORVASC) 10 MG tablet Take 1 tablet (10 mg total) by mouth daily.  Marland Kitchen ammonium lactate (AMLACTIN) 12 % cream Apply 1 Bottle topically at  bedtime.   Marland Kitchen aspirin EC 81 MG tablet Take 81 mg by mouth daily.  . carbidopa-levodopa (SINEMET IR) 25-100 MG tablet Take 1.5 tablets by mouth 3 (three) times daily.  . carboxymethylcellulose (REFRESH PLUS) 0.5 % SOLN Apply 2 drops to eye as needed (dry eyes).   . clopidogrel (PLAVIX) 75 MG tablet Take 75 mg by mouth daily.  . Cranberry 400 MG CAPS Take 1 capsule by mouth 3 (three) times daily.  Marland Kitchen FLAXSEED, LINSEED, PO Take 15 mLs by mouth.  Marland Kitchen guaiFENesin-codeine (ROBITUSSIN AC) 100-10 MG/5ML syrup Take 5 mLs by mouth every 6 (six) hours as needed for cough.  . MELATONIN-CHAMOMILE PO Take by mouth at bedtime as needed.  . Memantine HCl-Donepezil HCl (NAMZARIC) 28-10 MG CP24 Take 1 capsule at bedtime by mouth.   . Menthol, Topical Analgesic, (BIOFREEZE) 4 % GEL Apply 1 application topically 2 (two) times daily as needed (pain).   . ondansetron (ZOFRAN) 4 MG tablet Take 4 mg by mouth every 8 (eight) hours as needed for nausea or vomiting.  . pantoprazole (PROTONIX) 40 MG tablet Take 1 tablet (40 mg total) by mouth daily.  . polyethylene glycol (MIRALAX / GLYCOLAX) packet Take 17 g by mouth every other day.  . senna-docusate (SENOKOT-S) 8.6-50 MG tablet Take 2 tablets 2 (two) times daily by mouth.   . sertraline (ZOLOFT) 50 MG tablet Take 50 mg by mouth at bedtime.  Marland Kitchen telmisartan (MICARDIS) 20 MG tablet Take 20 mg by mouth daily.  Marland Kitchen triamcinolone (NASACORT) 55 MCG/ACT AERO nasal inhaler Place 2 sprays into the nose at bedtime.   No facility-administered encounter medications on file as of 11/24/2019.    Review of Systems  Constitutional: Negative for activity change, appetite change, chills, diaphoresis, fatigue, fever and unexpected weight change.  HENT: Negative for ear pain, postnasal drip and sinus pressure.   Eyes: Positive for pain, discharge, redness and itching. Negative for photophobia and visual disturbance.  Respiratory: Negative for cough, shortness of breath, wheezing and stridor.     Cardiovascular: Negative for chest pain, palpitations and leg swelling.  Gastrointestinal: Negative for abdominal distention, abdominal pain, constipation and diarrhea.  Genitourinary: Negative for difficulty urinating and dysuria.  Musculoskeletal: Positive for gait problem. Negative for arthralgias, back pain, joint swelling and myalgias.  Neurological: Negative for dizziness, seizures, syncope, facial asymmetry, speech difficulty, weakness and headaches.  Hematological: Negative for adenopathy. Does not bruise/bleed easily.  Psychiatric/Behavioral: Positive for confusion. Negative for agitation and behavioral problems.    Immunization History  Administered Date(s) Administered  . Influenza Inj Mdck Quad Pf 08/09/2017  . Influenza, High Dose Seasonal PF 07/31/2016, 07/31/2019  . Influenza,inj,Quad PF,6+ Mos 08/06/2018  . Influenza-Unspecified 08/01/2010, 07/30/2012, 10/16/2012, 10/16/2013  . Moderna SARS-COVID-2 Vaccination 11/18/2019  . Pneumococcal Polysaccharide-23 02/23/2004, 02/21/2013  . Pneumococcal-Unspecified 03/17/2016  . Td 12/02/2008  . Tdap 09/01/2016  .  Zoster 03/28/2006  . Zoster Recombinat (Shingrix) 10/29/2017, 01/24/2018   Pertinent  Health Maintenance Due  Topic Date Due  . HEMOGLOBIN A1C  12/29/2019  . FOOT EXAM  10/30/2020  . INFLUENZA VACCINE  Completed  . PNA vac Low Risk Adult  Completed  . OPHTHALMOLOGY EXAM  Discontinued   Fall Risk  09/06/2019 01/04/2019 07/17/2018 08/21/2017 04/04/2017  Falls in the past year? 1 Exclusion - non ambulatory No No No  Number falls in past yr: 0 - - - -  Injury with Fall? 0 - - - -  Risk for fall due to : History of fall(s) - - - -  Follow up Falls evaluation completed - - - -   Functional Status Survey:    Vitals:   11/24/19 1613  Weight: 154 lb 3.2 oz (69.9 kg)   Body mass index is 23.45 kg/m. Physical Exam Vitals and nursing note reviewed.  Constitutional:      General: He is not in acute distress.     Appearance: He is not diaphoretic.  HENT:     Head: Normocephalic and atraumatic.  Eyes:     General:        Right eye: Discharge (clear) present. No hordeolum.        Left eye: Discharge (clear) present.No hordeolum.     Conjunctiva/sclera:     Right eye: Right conjunctiva is not injected. No chemosis.    Left eye: Left conjunctiva is injected. Chemosis present.     Pupils: Pupils are equal, round, and reactive to light.  Neck:     Thyroid: No thyromegaly.     Vascular: No JVD.     Trachea: No tracheal deviation.  Cardiovascular:     Rate and Rhythm: Normal rate and regular rhythm.     Heart sounds: No murmur.  Pulmonary:     Effort: Pulmonary effort is normal. No respiratory distress.     Breath sounds: Normal breath sounds. No wheezing.  Abdominal:     General: Bowel sounds are normal. There is no distension.     Palpations: Abdomen is soft.     Tenderness: There is no abdominal tenderness.  Musculoskeletal:     Cervical back: Normal range of motion and neck supple.  Lymphadenopathy:     Cervical: No cervical adenopathy.  Skin:    General: Skin is warm and dry.  Neurological:     Mental Status: He is alert and oriented to person, place, and time.     Cranial Nerves: No cranial nerve deficit.     Labs reviewed: Recent Labs    04/07/19 0000 04/08/19 0000 04/11/19 0000  NA 136* 143 141  K 3.3* 3.9 3.3*  BUN 56* 50* 17  CREATININE 1.5* 1.5* 0.8   No results for input(s): AST, ALT, ALKPHOS, BILITOT, PROT, ALBUMIN in the last 8760 hours. Recent Labs    04/04/19 0000 04/08/19 0000  WBC 17.6 13.0  HGB 10.8* 10.8*  HCT 33* 32*  PLT 383 460*   Lab Results  Component Value Date   TSH 3.14 01/18/2018   Lab Results  Component Value Date   HGBA1C 6.6 07/01/2019   Lab Results  Component Value Date   CHOL 127 05/11/2017   HDL 50 05/11/2017   LDLCALC 58 05/11/2017   TRIG 98 05/11/2017    Significant Diagnostic Results in last 30 days:  No results  found.  Assessment/Plan 1. Conjunctivitis due to COVID-19 Initially this appeared to be related to Covid 19  And  later developed into a bacterial process. Now he is continuing to experience some discomfort and itching on the left. Review of recent literature indicates that there may be some concern for associated complications. Will have the nurse refer to ophthalmology. He is off isolation and has no fever at this time. Continue refresh gtts.  2. Controlled type 2 diabetes mellitus without complication, without long-term current use of insulin ([place]) Diet controlled needs A1C  3. Essential hypertension Controlled, continue norvasc 10 mg qd and micardis 20 mg qd   4. Situational depression Unchanged, no s/s of depression. Continue Zoloft 50 mg qd  5. Benign prostatic hyperplasia with urinary retention No current symptoms.   6. Chronic indwelling Foley catheter Cath maintenance per [facility]     Family/ staff Communication: staff/resident  Labs/tests ordered:  CBC BMP A1C

## 2019-11-27 DIAGNOSIS — D649 Anemia, unspecified: Secondary | ICD-10-CM | POA: Diagnosis not present

## 2019-11-27 DIAGNOSIS — Z79899 Other long term (current) drug therapy: Secondary | ICD-10-CM | POA: Diagnosis not present

## 2019-11-27 DIAGNOSIS — E119 Type 2 diabetes mellitus without complications: Secondary | ICD-10-CM | POA: Diagnosis not present

## 2019-11-27 DIAGNOSIS — H10413 Chronic giant papillary conjunctivitis, bilateral: Secondary | ICD-10-CM | POA: Diagnosis not present

## 2019-11-27 LAB — HEMOGLOBIN A1C: Hemoglobin A1C: 7.4

## 2019-12-16 ENCOUNTER — Encounter: Payer: Self-pay | Admitting: Internal Medicine

## 2019-12-16 ENCOUNTER — Non-Acute Institutional Stay (SKILLED_NURSING_FACILITY): Payer: Medicare Other | Admitting: Internal Medicine

## 2019-12-16 DIAGNOSIS — Z978 Presence of other specified devices: Secondary | ICD-10-CM | POA: Diagnosis not present

## 2019-12-16 DIAGNOSIS — G309 Alzheimer's disease, unspecified: Secondary | ICD-10-CM

## 2019-12-16 DIAGNOSIS — F0280 Dementia in other diseases classified elsewhere without behavioral disturbance: Secondary | ICD-10-CM

## 2019-12-16 DIAGNOSIS — N401 Enlarged prostate with lower urinary tract symptoms: Secondary | ICD-10-CM | POA: Diagnosis not present

## 2019-12-16 DIAGNOSIS — E119 Type 2 diabetes mellitus without complications: Secondary | ICD-10-CM | POA: Diagnosis not present

## 2019-12-16 DIAGNOSIS — R338 Other retention of urine: Secondary | ICD-10-CM

## 2019-12-16 DIAGNOSIS — Z87440 Personal history of urinary (tract) infections: Secondary | ICD-10-CM

## 2019-12-16 DIAGNOSIS — F0150 Vascular dementia without behavioral disturbance: Secondary | ICD-10-CM

## 2019-12-16 DIAGNOSIS — G214 Vascular parkinsonism: Secondary | ICD-10-CM | POA: Diagnosis not present

## 2019-12-16 NOTE — Progress Notes (Signed)
Patient ID: Kenneth Stark, male   DOB: 08/11/1925, 86 y.o.   MRN: [id number]  Location:  [facility] Room Number: [place] of Service:  SNF ([phone]) Provider:   [name], DO  Patient Care Team: [name], DO as PCP - General (Geriatric Medicine) Danella Sensing, MD as Consulting Physician (Dermatology) Sharyne Peach, MD as Consulting Physician (Ophthalmology) Irine Seal, MD as Attending Physician (Urology) [name], MD as Consulting Physician (Neurology)  Extended Emergency Contact Information Primary Emergency Contact: Stark,Kenneth Address: [id number]          [place] [address] Kenneth Stark of [place] Phone: [phone] Relation: Spouse Secondary Emergency Contact: Kenneth Stark [place] of [place] Mobile Phone: [phone] Relation: Daughter  Code Status:  DNR Goals of care: Advanced Directive information Advanced Directives 12/16/2019  Does Patient Have a Medical Advance Directive? Yes  Type of Advance Directive Out of facility DNR (pink MOST or yellow form)  Does patient want to make changes to medical advance directive? No - Patient declined  Copy of [place] in Chart? -  Would patient like information on creating a medical advance directive? -  Pre-existing out of facility DNR order (yellow form or pink MOST form) -   Chief Complaint  Patient presents with  . Medical Management of Chronic Issues    Routine Visit     HPI:  Pt is a 86 y.o. male seen today for medical management of chronic diseases.  Kenneth Stark has not had any recent changes.  He was awake when I saw him today, but still did not converse much just before lunchtime.  He was asking for his caregiver to comb his hair for him.  Overall, he's been gradually slowing down with less speech.    Weights:  Recently stable in mid 150s.  Staff are to be pushing fluids at all times to prevent UTIs.  He had fever with  otitis externa, covid in Jan, then conjunctivitis after it.  He has recurrent UTIs and chronic foley catheter.    Past Medical History:  Diagnosis Date  . Abnormality of gait   . Arthritis   . Brain bleed ([place]) 09/01/2016  . Cervical spondylosis   . Controlled type 2 diabetes mellitus without complication, without long-term current use of insulin ([place]) 06/24/2019  . Degenerative joint disease (DJD) of lumbar spine   . Dementia ([place]) 04/28/2019  . Depression   . Diplopia   . Dyslipidemia   . Essential tremor   . Foul smelling urine   . Frequent falls   . GERD (gastroesophageal reflux disease)   . Glycosuria   . Hearing difficulty    hearing aids  . Hyperlipidemia   . Hypertension   . Hyperthyroidism   . Memory loss   . Osteoarthritis   . Radiculopathy, lumbar region   . 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 Small vessel disease ([place])   . TIA (transient ischemic attack)    Past Surgical History:  Procedure Laterality Date  . APPENDECTOMY     done as a child  . GALLBLADDER SURGERY  2008  . KNEE ARTHROSCOPY Left    Dr. Hart Robinsons 2002  . knee injections Right    Dr. Adriana Mccallum  . TONSILLECTOMY     done as a child    No Known Allergies  Outpatient Encounter Medications as of 12/16/2019  Medication Sig  .  amLODipine (NORVASC) 10 MG tablet Take 1 tablet (10 mg total) by mouth daily.  Marland Kitchen ammonium lactate (AMLACTIN) 12 % cream Apply 1 Bottle topically at bedtime.   Marland Kitchen aspirin EC 81 MG tablet Take 81 mg by mouth daily.  . carbidopa-levodopa (SINEMET IR) 25-100 MG tablet Take 1.5 tablets by mouth 3 (three) times daily.  . carboxymethylcellulose (REFRESH PLUS) 0.5 % SOLN Apply 2 drops to eye as needed (dry eyes).   . clopidogrel (PLAVIX) 75 MG tablet Take 75 mg by mouth daily.  . Cranberry 400 MG CAPS Take 1 capsule by mouth 3 (three) times daily.  Marland Kitchen FLAXSEED, LINSEED, PO Take 15 mLs by mouth.  Marland Kitchen ipratropium-albuterol (DUONEB) 0.5-2.5 (3) MG/3ML SOLN  Take 3 mLs by nebulization every 8 (eight) hours as needed.  Marland Kitchen MELATONIN-CHAMOMILE PO Take by mouth at bedtime as needed.  . Memantine HCl-Donepezil HCl (NAMZARIC) 28-10 MG CP24 Take 1 capsule at bedtime by mouth.   . Menthol, Topical Analgesic, (BIOFREEZE) 4 % GEL Apply 1 application topically 2 (two) times daily as needed (pain).   . ondansetron (ZOFRAN) 4 MG tablet Take 4 mg by mouth every 8 (eight) hours as needed for nausea or vomiting.  . pantoprazole (PROTONIX) 40 MG tablet Take 1 tablet (40 mg total) by mouth daily.  . polyethylene glycol (MIRALAX / GLYCOLAX) packet Take 17 g by mouth every other day.  . senna-docusate (SENOKOT-S) 8.6-50 MG tablet Take 2 tablets 2 (two) times daily by mouth.   . sertraline (ZOLOFT) 50 MG tablet Take 50 mg by mouth at bedtime.  Marland Kitchen telmisartan (MICARDIS) 20 MG tablet Take 20 mg by mouth daily.  Marland Kitchen triamcinolone (NASACORT) 55 MCG/ACT AERO nasal inhaler Place 2 sprays into the nose at bedtime.  . [DISCONTINUED] guaiFENesin-codeine (ROBITUSSIN AC) 100-10 MG/5ML syrup Take 5 mLs by mouth every 6 (six) hours as needed for cough.   No facility-administered encounter medications on file as of 12/16/2019.    Review of Systems  Reason unable to perform ROS: most obtained from nursing and CNA.  Constitutional: Positive for fatigue. Negative for activity change, appetite change, chills and fever.  HENT: Positive for trouble swallowing. Negative for congestion and sore throat.   Eyes: Negative for visual disturbance.  Respiratory: Negative for cough and shortness of breath.   Gastrointestinal: Negative for abdominal pain, constipation, diarrhea, nausea and vomiting.  Genitourinary: Negative for dysuria.       Chronic foley  Musculoskeletal: Positive for gait problem. Negative for arthralgias.  Skin: Negative for color change.  Neurological: Positive for weakness. Negative for dizziness.  Psychiatric/Behavioral: Positive for confusion. Negative for sleep  disturbance.    Immunization History  Administered Date(s) Administered  . Influenza Inj Mdck Quad Pf 08/09/2017  . Influenza, High Dose Seasonal PF 07/31/2016, 07/31/2019  . Influenza,inj,Quad PF,6+ Mos 08/06/2018  . Influenza-Unspecified 08/01/2010, 07/30/2012, 10/16/2012, 10/16/2013  . Moderna SARS-COVID-2 Vaccination 11/18/2019  . Pneumococcal Polysaccharide-23 02/23/2004, 02/21/2013  . Pneumococcal-Unspecified 03/17/2016  . Td 12/02/2008  . Tdap 09/01/2016  . Zoster 03/28/2006  . Zoster Recombinat (Shingrix) 10/29/2017, 01/24/2018   Pertinent  Health Maintenance Due  Topic Date Due  . HEMOGLOBIN A1C  12/29/2019  . FOOT EXAM  10/30/2020  . INFLUENZA VACCINE  Completed  . PNA vac Low Risk Adult  Completed  . OPHTHALMOLOGY EXAM  Discontinued   Fall Risk  09/06/2019 01/04/2019 07/17/2018 08/21/2017 04/04/2017  Falls in the past year? 1 Exclusion - non ambulatory No No No  Number falls in past yr: 0 - - - -  Injury with Fall? 0 - - - -  Risk for fall due to : History of fall(s) - - - -  Follow up Falls evaluation completed - - - -   Functional Status Survey:    Vitals:   12/16/19 1002  BP: 123/70  Pulse: 68  Temp: 98.2 F (36.8 C)  SpO2: 97%  Weight: 155 lb 11.2 oz (70.6 kg)  Height: 5\' 8"  (1.727 m)   Body mass index is 23.67 kg/m. Physical Exam Vitals and nursing note reviewed.  Constitutional:      General: He is not in acute distress.    Appearance: He is not toxic-appearing.  HENT:     Head: Normocephalic and atraumatic.  Cardiovascular:     Rate and Rhythm: Normal rate and regular rhythm.     Pulses: Normal pulses.     Heart sounds: Normal heart sounds.  Pulmonary:     Effort: Pulmonary effort is normal.     Breath sounds: Normal breath sounds. No wheezing, rhonchi or rales.  Abdominal:     General: Bowel sounds are normal.  Genitourinary:    Comments: Chronic foley with dark yellow urine today Musculoskeletal:     Right lower leg: No edema.      Left lower leg: No edema.     Comments: Uses high back manual wheelchair  Skin:    General: Skin is warm and dry.     Capillary Refill: Capillary refill takes less than 2 seconds.  Neurological:     Mental Status: He is alert.     Comments: Masked facies  Psychiatric:        Mood and Affect: Mood normal.     Labs reviewed: Recent Labs    04/07/19 0000 04/08/19 0000 04/11/19 0000  NA 136* 143 141  K 3.3* 3.9 3.3*  BUN 56* 50* 17  CREATININE 1.5* 1.5* 0.8   No results for input(s): AST, ALT, ALKPHOS, BILITOT, PROT, ALBUMIN in the last 8760 hours. Recent Labs    04/04/19 0000 04/08/19 0000  WBC 17.6 13.0  HGB 10.8* 10.8*  HCT 33* 32*  PLT 383 460*   Lab Results  Component Value Date   TSH 3.14 01/18/2018   Lab Results  Component Value Date   HGBA1C 6.6 07/01/2019   Lab Results  Component Value Date   CHOL 127 05/11/2017   HDL 50 05/11/2017   LDLCALC 58 05/11/2017   TRIG 98 05/11/2017    Assessment/Plan 1. Mixed Alzheimer's and vascular dementia ([place]) -gradually progressing -he's fully dependent in ADLs in SNF  2. Controlled type 2 diabetes mellitus without complication, without long-term current use of insulin ([facility]) Lab Results  Component Value Date   HGBA1C 6.6 07/01/2019  -not on any meds for this   3. 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 Benign prostatic hyperplasia with urinary retention -continue chronic foley and adequate hydration to prevent infections  5. Chronic indwelling Foley catheter -maintain hydration to keep urine from getting very dark, very high risk for infections  6.  H/o recurrent UTIs -again, hydrate well, avoid overtesting just for dark urine due to colonization  Family/ staff Communication: discussed with snf nurse  Labs/tests ordered:  No new   Kenlee Maler L. Tamlyn Sides, D.O. [facility] [address], [place] [address] Cell Phone (Mon-Fri 8am-5pm):   [phone] On Call:  [phone] & follow prompts after 5pm & weekends Office Phone:  [phone] Office Fax:  [phone]

## 2020-01-11 ENCOUNTER — Encounter: Payer: Self-pay | Admitting: Internal Medicine

## 2020-01-12 DIAGNOSIS — D649 Anemia, unspecified: Secondary | ICD-10-CM | POA: Diagnosis not present

## 2020-01-12 DIAGNOSIS — R319 Hematuria, unspecified: Secondary | ICD-10-CM | POA: Diagnosis not present

## 2020-01-12 DIAGNOSIS — R410 Disorientation, unspecified: Secondary | ICD-10-CM | POA: Diagnosis not present

## 2020-01-12 DIAGNOSIS — Z79899 Other long term (current) drug therapy: Secondary | ICD-10-CM | POA: Diagnosis not present

## 2020-01-12 DIAGNOSIS — N390 Urinary tract infection, site not specified: Secondary | ICD-10-CM | POA: Diagnosis not present

## 2020-01-12 LAB — BASIC METABOLIC PANEL
BUN: 20 (ref 4–21)
CO2: 25 — AB (ref 13–22)
Chloride: 104 (ref 99–108)
Creatinine: 0.9 (ref 0.6–1.3)
Glucose: 124
Potassium: 4 (ref 3.4–5.3)
Sodium: 141 (ref 137–147)

## 2020-01-12 LAB — CBC AND DIFFERENTIAL
HCT: 34 — AB (ref 41–53)
Hemoglobin: 11.2 — AB (ref 13.5–17.5)
Platelets: 331 (ref 150–399)
WBC: 9.7

## 2020-01-12 LAB — CBC: RBC: 3.95 (ref 3.87–5.11)

## 2020-01-12 LAB — COMPREHENSIVE METABOLIC PANEL: Calcium: 9.7 (ref 8.7–10.7)

## 2020-01-14 ENCOUNTER — Other Ambulatory Visit: Payer: Self-pay | Admitting: Internal Medicine

## 2020-01-14 DIAGNOSIS — B9620 Unspecified Escherichia coli [E. coli] as the cause of diseases classified elsewhere: Secondary | ICD-10-CM

## 2020-01-14 DIAGNOSIS — N390 Urinary tract infection, site not specified: Secondary | ICD-10-CM

## 2020-01-14 MED ORDER — CIPROFLOXACIN HCL 500 MG PO TABS: 500.0000 mg | ORAL_TABLET | Freq: Two times a day (BID) | ORAL | 0 refills | Status: AC

## 2020-01-15 ENCOUNTER — Encounter: Payer: Self-pay | Admitting: Internal Medicine

## 2020-01-22 ENCOUNTER — Encounter: Payer: Self-pay | Admitting: Adult Health

## 2020-01-22 ENCOUNTER — Non-Acute Institutional Stay (SKILLED_NURSING_FACILITY): Payer: Medicare Other | Admitting: Adult Health

## 2020-01-22 DIAGNOSIS — H1013 Acute atopic conjunctivitis, bilateral: Secondary | ICD-10-CM

## 2020-01-22 DIAGNOSIS — F0280 Dementia in other diseases classified elsewhere without behavioral disturbance: Secondary | ICD-10-CM

## 2020-01-22 DIAGNOSIS — F4321 Adjustment disorder with depressed mood: Secondary | ICD-10-CM

## 2020-01-22 DIAGNOSIS — G309 Alzheimer's disease, unspecified: Secondary | ICD-10-CM

## 2020-01-22 DIAGNOSIS — R338 Other retention of urine: Secondary | ICD-10-CM

## 2020-01-22 DIAGNOSIS — Z978 Presence of other specified devices: Secondary | ICD-10-CM | POA: Diagnosis not present

## 2020-01-22 DIAGNOSIS — N401 Enlarged prostate with lower urinary tract symptoms: Secondary | ICD-10-CM | POA: Diagnosis not present

## 2020-01-22 DIAGNOSIS — F0150 Vascular dementia without behavioral disturbance: Secondary | ICD-10-CM

## 2020-01-22 DIAGNOSIS — I10 Essential (primary) hypertension: Secondary | ICD-10-CM

## 2020-01-22 NOTE — Progress Notes (Signed)
Location:  [facility] of Service:  SNF (31) Provider:   Cindi Carbon, ANP [facility] [phone]   [name], DO  Patient Care Team: [name], DO as PCP - General (Geriatric Medicine) Danella Sensing, MD as Consulting Physician (Dermatology) Sharyne Peach, MD as Consulting Physician (Ophthalmology) Irine Seal, MD as Attending Physician (Urology) [name], MD as Consulting Physician (Neurology)  Extended Emergency Contact Information Primary Emergency Contact: Stark,Kenneth Address: [id number]          [place] [address] Kenneth Stark of [place] Phone: [phone] Relation: Spouse Secondary Emergency Contact: Kenneth Stark [place] of [place] Mobile Phone: [phone] Relation: Daughter  Code Status:  DNR Goals of care: Advanced Directive information Advanced Directives 12/16/2019  Does Patient Have a Medical Advance Directive? Yes  Type of Advance Directive Out of facility DNR (pink MOST or yellow form)  Does patient want to make changes to medical advance directive? No - Patient declined  Copy of [place] in Chart? -  Would patient like information on creating a medical advance directive? -  Pre-existing out of facility DNR order (yellow form or pink MOST form) -     Chief Complaint  Patient presents with  . Medical Management of Chronic Issues    HPI:  Pt is a 86 y.o. Stark seen today for medical management of chronic diseases.    He has had issue with itchy and red eyes and was seen by ophthalmology and prescribed Maxitrol. He reports that his eyes have improved.    He was treated with Cipro for a UTI completed on 01/22/20.  He denies any issues with bladder pain, fever, and does not have hematuria. He has recurrent UTI due to having a foley catheter and tends to bleed due to the asa and plavix use.   Situation depression: he denies any depressive symptoms, has  a good appetite, and sleeps well. He has a flat affect which in part is due to his dementia diagnosis. The nurse does not note any issues with his mood.   He has had some progression in his dementia over the past year.  He is a lift for transfers and requires assistance for all ADLs.   Past Medical History:  Diagnosis Date  . Abnormality of gait   . Arthritis   . Brain bleed ([place]) 09/01/2016  . Cervical spondylosis   . Controlled type 2 diabetes mellitus without complication, without long-term current use of insulin ([place]) 06/24/2019  . Degenerative joint disease (DJD) of lumbar spine   . Dementia ([place]) 04/28/2019  . Depression   . Diplopia   . Dyslipidemia   . Essential tremor   . Foul smelling urine   . Frequent falls   . GERD (gastroesophageal reflux disease)   . Glycosuria   . Hearing difficulty    hearing aids  . Hyperlipidemia   . Hypertension   . Hyperthyroidism   . Memory loss   . Osteoarthritis   . Radiculopathy, lumbar region   . 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 Small vessel disease ([place])   . TIA (transient ischemic attack)    Past Surgical History:  Procedure Laterality Date  . APPENDECTOMY     done as a child  . GALLBLADDER SURGERY  2008  . KNEE ARTHROSCOPY Left    Dr. Hart Robinsons 2002  . knee injections Right  Dr. Adriana Mccallum  . TONSILLECTOMY     done as a child    No Known Allergies  Outpatient Encounter Medications as of 01/22/2020  Medication Sig  . amLODipine (NORVASC) 10 MG tablet Take 1 tablet (10 mg total) by mouth daily.  Marland Kitchen ammonium lactate (AMLACTIN) 12 % cream Apply 1 Bottle topically at bedtime.   Marland Kitchen aspirin EC 81 MG tablet Take 81 mg by mouth daily.  . carbidopa-levodopa (SINEMET IR) 25-100 MG tablet Take 1.5 tablets by mouth 3 (three) times daily.  . carboxymethylcellulose (REFRESH PLUS) 0.5 % SOLN Apply 2 drops to eye as needed (dry eyes).   . clopidogrel (PLAVIX) Kenneth MG tablet Take Kenneth mg by mouth daily.  .  Cranberry 400 MG CAPS Take 1 capsule by mouth 3 (three) times daily.  Marland Kitchen ipratropium-albuterol (DUONEB) 0.5-2.5 (3) MG/3ML SOLN Take 3 mLs by nebulization every 8 (eight) hours as needed.  Marland Kitchen MELATONIN-CHAMOMILE PO Take by mouth at bedtime as needed.  . Memantine HCl-Donepezil HCl (NAMZARIC) 28-10 MG CP24 Take 1 capsule at bedtime by mouth.   . Menthol, Topical Analgesic, (BIOFREEZE) 4 % GEL Apply 1 application topically 2 (two) times daily as needed (pain).   Marland Kitchen neomycin-polymyxin-dexamethasone (MAXITROL) 0.1 % ophthalmic suspension 1 drop 4 (four) times daily. Qid x 1 week and bid x 2 weeks  . olopatadine (PATADAY) 0.1 % ophthalmic solution Place 1 drop into both eyes 2 (two) times daily.  . ondansetron (ZOFRAN) 4 MG tablet Take 4 mg by mouth every 8 (eight) hours as needed for nausea or vomiting.  . pantoprazole (PROTONIX) 40 MG tablet Take 1 tablet (40 mg total) by mouth daily.  . polyethylene glycol (MIRALAX / GLYCOLAX) packet Take 17 g by mouth every other day.  . senna-docusate (SENOKOT-S) 8.6-50 MG tablet Take 2 tablets 2 (two) times daily by mouth.   . sertraline (ZOLOFT) 50 MG tablet Take 50 mg by mouth at bedtime.  Marland Kitchen telmisartan (MICARDIS) 20 MG tablet Take 20 mg by mouth daily.  Marland Kitchen triamcinolone (NASACORT) 55 MCG/ACT AERO nasal inhaler Place 2 sprays into the nose at bedtime.  Marland Kitchen FLAXSEED, LINSEED, PO Take 15 mLs by mouth.   No facility-administered encounter medications on file as of 01/22/2020.    Review of Systems  Constitutional: Negative for activity change, appetite change, chills, diaphoresis, fatigue, fever and unexpected weight change.  Respiratory: Negative for cough, shortness of breath, wheezing and stridor.   Cardiovascular: Negative for chest pain, palpitations and leg swelling.  Gastrointestinal: Negative for abdominal distention, abdominal pain, constipation and diarrhea.  Genitourinary: Negative for difficulty urinating, dysuria and hematuria.  Musculoskeletal: Positive  for gait problem. Negative for arthralgias, back pain, joint swelling and myalgias.  Neurological: Negative for dizziness, seizures, syncope, facial asymmetry, speech difficulty, weakness and headaches.  Hematological: Negative for adenopathy. Does not bruise/bleed easily.  Psychiatric/Behavioral: Positive for confusion. Negative for agitation, behavioral problems, dysphoric mood and sleep disturbance. The patient is not nervous/anxious.     Immunization History  Administered Date(s) Administered  . Influenza Inj Mdck Quad Pf 08/09/2017  . Influenza, High Dose Seasonal PF 07/31/2016, 07/31/2019  . Influenza,inj,Quad PF,6+ Mos 08/06/2018  . Influenza-Unspecified 08/01/2010, 07/30/2012, 10/16/2012, 10/16/2013  . Moderna SARS-COVID-2 Vaccination 11/18/2019  . Pneumococcal Polysaccharide-23 02/23/2004, 02/21/2013  . Pneumococcal-Unspecified 03/17/2016  . Td 12/02/2008  . Tdap 09/01/2016  . Zoster 03/28/2006  . Zoster Recombinat (Shingrix) 10/29/2017, 01/24/2018   Pertinent  Health Maintenance Due  Topic Date Due  . HEMOGLOBIN A1C  12/29/2019  .  INFLUENZA VACCINE  05/16/2020  . FOOT EXAM  10/30/2020  . PNA vac Low Risk Adult  Completed  . OPHTHALMOLOGY EXAM  Discontinued   Fall Risk  09/06/2019 01/04/2019 07/17/2018 08/21/2017 04/04/2017  Falls in the past year? 1 Exclusion - non ambulatory No No No  Number falls in past yr: 0 - - - -  Injury with Fall? 0 - - - -  Risk for fall due to : History of fall(s) - - - -  Follow up Falls evaluation completed - - - -   Functional Status Survey:    Vitals:   01/22/20 1645  Weight: 154 lb 12.8 oz (70.2 kg)   Body mass index is 23.54 kg/m. Physical Exam Vitals and nursing note reviewed.  Constitutional:      General: He is not in acute distress.    Appearance: He is not diaphoretic.  HENT:     Head: Normocephalic and atraumatic.  Eyes:     General:        Right eye: No discharge.        Left eye: No discharge.      Conjunctiva/sclera: Conjunctivae normal.     Pupils: Pupils are equal, round, and reactive to light.  Neck:     Thyroid: No thyromegaly.     Vascular: No JVD.     Trachea: No tracheal deviation.  Cardiovascular:     Rate and Rhythm: Normal rate and regular rhythm.     Heart sounds: No murmur.  Pulmonary:     Effort: Pulmonary effort is normal. No respiratory distress.     Breath sounds: Normal breath sounds. No wheezing.  Abdominal:     General: Bowel sounds are normal. There is no distension.     Palpations: Abdomen is soft.     Tenderness: There is no abdominal tenderness.  Musculoskeletal:     Cervical back: Normal range of motion and neck supple.  Lymphadenopathy:     Cervical: No cervical adenopathy.  Skin:    General: Skin is warm and dry.  Neurological:     General: No focal deficit present.     Mental Status: He is alert. Mental status is at baseline.  Psychiatric:     Comments: flat     Labs reviewed: Recent Labs    04/08/19 0000 04/11/19 0000 01/12/20 0600  NA 143 141 141  K 3.9 3.3* 4.0  CL  --   --  104  CO2  --   --  25*  BUN 50* 17 20  CREATININE 1.5* 0.8 0.9  CALCIUM  --   --  9.7   No results for input(s): AST, ALT, ALKPHOS, BILITOT, PROT, ALBUMIN in the last 8760 hours. Recent Labs    04/04/19 0000 04/08/19 0000 01/12/20 0600  WBC 17.6 13.0 9.7  HGB 10.8* 10.8* 11.2*  HCT 33* 32* 34*  PLT 383 460* 331   Lab Results  Component Value Date   TSH 3.14 01/18/2018   Lab Results  Component Value Date   HGBA1C 6.6 07/01/2019   Lab Results  Component Value Date   CHOL 127 05/11/2017   HDL 50 05/11/2017   LDLCALC 58 05/11/2017   TRIG 98 05/11/2017    Significant Diagnostic Results in last 30 days:  No results found.  Assessment/Plan 1. Allergic conjunctivitis of both eyes Possibly due to covid hx  Improved Continue maxitrol and Pataday.   2. Situational depression His mood remains flat which could be due to his hx of  dementia  and/or depression. At this point I would not taper the zoloft and continue at 50 mg qhs   3. Benign prostatic hyperplasia with urinary retention Led to foley cath placement   4. Chronic indwelling Foley catheter Hx of recurrent UTI and hematuria. Continue cranberry supplement tid.  [facility] cath [facility] by [facility]   5. Mixed Alzheimer's and vascular dementia ([place]) Progressive decline in cognition and physical function c/w the disease. Continue supportive care in the skilled environment. Followed by neurology and continues on Namzaric   6. Essential hypertension Controlled  Continue Micardis 20 mg qd and Norvasc 10 mg qd     Family/ staff Communication: nurse and resident   Labs/tests ordered:  NA

## 2020-01-27 DIAGNOSIS — B351 Tinea unguium: Secondary | ICD-10-CM | POA: Diagnosis not present

## 2020-01-27 DIAGNOSIS — E1159 Type 2 diabetes mellitus with other circulatory complications: Secondary | ICD-10-CM | POA: Diagnosis not present

## 2020-01-27 DIAGNOSIS — L84 Corns and callosities: Secondary | ICD-10-CM | POA: Diagnosis not present

## 2020-01-27 DIAGNOSIS — Q6689 Other  specified congenital deformities of feet: Secondary | ICD-10-CM | POA: Diagnosis not present

## 2020-02-16 ENCOUNTER — Encounter: Payer: Self-pay | Admitting: Adult Health

## 2020-02-16 ENCOUNTER — Non-Acute Institutional Stay (SKILLED_NURSING_FACILITY): Payer: Medicare Other | Admitting: Adult Health

## 2020-02-16 DIAGNOSIS — F0280 Dementia in other diseases classified elsewhere without behavioral disturbance: Secondary | ICD-10-CM

## 2020-02-16 DIAGNOSIS — I10 Essential (primary) hypertension: Secondary | ICD-10-CM | POA: Diagnosis not present

## 2020-02-16 DIAGNOSIS — Z978 Presence of other specified devices: Secondary | ICD-10-CM

## 2020-02-16 DIAGNOSIS — G214 Vascular parkinsonism: Secondary | ICD-10-CM

## 2020-02-16 DIAGNOSIS — K219 Gastro-esophageal reflux disease without esophagitis: Secondary | ICD-10-CM

## 2020-02-16 DIAGNOSIS — G309 Alzheimer's disease, unspecified: Secondary | ICD-10-CM | POA: Diagnosis not present

## 2020-02-16 DIAGNOSIS — F0150 Vascular dementia without behavioral disturbance: Secondary | ICD-10-CM

## 2020-02-16 DIAGNOSIS — R338 Other retention of urine: Secondary | ICD-10-CM

## 2020-02-16 DIAGNOSIS — I679 Cerebrovascular disease, unspecified: Secondary | ICD-10-CM

## 2020-02-16 DIAGNOSIS — N401 Enlarged prostate with lower urinary tract symptoms: Secondary | ICD-10-CM

## 2020-02-16 NOTE — Progress Notes (Signed)
Location:  Occupational psychologist of Service:  SNF (31) Provider:   Cindi Carbon, ANP [place] [phone]   [name], DO  Patient Care Team: [name], DO as PCP - General (Geriatric Medicine) Danella Sensing, MD as Consulting Physician (Dermatology) Sharyne Peach, MD as Consulting Physician (Ophthalmology) Irine Seal, MD as Attending Physician (Urology) [name], MD as Consulting Physician (Neurology)  Extended Emergency Contact Information Primary Emergency Contact: Gibb,[name] Address: [id number]          [place] [address] [name] of [place] Phone: [phone] Relation: Spouse Secondary Emergency Contact: [name] States of [place] Mobile Phone: [phone] Relation: Daughter  Code Status:  DNR Goals of care: Advanced Directive information Advanced Directives 12/16/2019  Does Patient Have a Medical Advance Directive? Yes  Type of Advance Directive Out of facility DNR (pink MOST or yellow form)  Does patient want to make changes to medical advance directive? No - Patient declined  Copy of [place] in Chart? -  Would patient like information on creating a medical advance directive? -  Pre-existing out of facility DNR order (yellow form or pink MOST form) -     Chief Complaint  Patient presents with  . Medical Management of Chronic Issues    HPI:  Pt is a 86 y.o. male seen today for medical management of chronic diseases.    He resides in skilled care due to functional losses and cognitive decline associated with Alz/vascular dementia and parkinsonism. His dementia has progressed over the past year. He remains dependent on staff for all ADLs. There are no issues with hallucinations or behaviors. He has some mild rigidity but no issues with tremor or bp issues.   GERD: continues on protonix, no acute issues  Has a chronic indwelling foley due to bph, no  current symptoms  BP controlled per review of bp records in matrix.    Functional status: hoyer lift for transfers, has foley catheter, incontinent of stool Past Medical History:  Diagnosis Date  . Abnormality of gait   . Arthritis   . Brain bleed ([place]) 09/01/2016  . Cervical spondylosis   . Controlled type 2 diabetes mellitus without complication, without long-term current use of insulin ([place]) 06/24/2019  . Degenerative joint disease (DJD) of lumbar spine   . Dementia ([place]) 04/28/2019  . Depression   . Diplopia   . Dyslipidemia   . Essential tremor   . Foul smelling urine   . Frequent falls   . GERD (gastroesophageal reflux disease)   . Glycosuria   . Hearing difficulty    hearing aids  . Hyperlipidemia   . Hypertension   . Hyperthyroidism   . Memory loss   . Osteoarthritis   . Radiculopathy, lumbar region   . 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 Small vessel disease ([place])   . TIA (transient ischemic attack)    Past Surgical History:  Procedure Laterality Date  . APPENDECTOMY     done as a child  . GALLBLADDER SURGERY  2008  . KNEE ARTHROSCOPY Left    Dr. Hart Robinsons 2002  . knee injections Right    Dr. Adriana Mccallum  . TONSILLECTOMY     done as a child    No Known Allergies  Outpatient Encounter Medications as of 02/16/2020  Medication Sig  . amLODipine (NORVASC) 10 MG tablet Take 1 tablet (10  mg total) by mouth daily.  Marland Kitchen ammonium lactate (AMLACTIN) 12 % cream Apply 1 Bottle topically at bedtime.   . carboxymethylcellulose (REFRESH PLUS) 0.5 % SOLN Apply 2 drops to eye as needed (dry eyes).   . clopidogrel (PLAVIX) 75 MG tablet Take 75 mg by mouth daily.  . Cranberry 400 MG CAPS Take 1 capsule by mouth 3 (three) times daily.  Marland Kitchen ipratropium-albuterol (DUONEB) 0.5-2.5 (3) MG/3ML SOLN Take 3 mLs by nebulization every 8 (eight) hours as needed.  Marland Kitchen MELATONIN-CHAMOMILE PO Take by mouth at bedtime as needed.  . Memantine HCl-Donepezil HCl  (NAMZARIC) 28-10 MG CP24 Take 1 capsule at bedtime by mouth.   . Menthol, Topical Analgesic, (BIOFREEZE) 4 % GEL Apply 1 application topically 2 (two) times daily as needed (pain).   Marland Kitchen olopatadine (PATADAY) 0.1 % ophthalmic solution Place 1 drop into both eyes 2 (two) times daily.  . ondansetron (ZOFRAN) 4 MG tablet Take 4 mg by mouth every 8 (eight) hours as needed for nausea or vomiting.  . pantoprazole (PROTONIX) 40 MG tablet Take 1 tablet (40 mg total) by mouth daily.  . polyethylene glycol (MIRALAX / GLYCOLAX) packet Take 17 g by mouth every other day.  . senna-docusate (SENOKOT-S) 8.6-50 MG tablet Take 2 tablets 2 (two) times daily by mouth.   . sertraline (ZOLOFT) 50 MG tablet Take 50 mg by mouth at bedtime.  Marland Kitchen telmisartan (MICARDIS) 20 MG tablet Take 20 mg by mouth daily.  Marland Kitchen triamcinolone (NASACORT) 55 MCG/ACT AERO nasal inhaler Place 2 sprays into the nose at bedtime.  Marland Kitchen aspirin EC 81 MG tablet Take 81 mg by mouth daily.  . carbidopa-levodopa (SINEMET IR) 25-100 MG tablet Take 1.5 tablets by mouth 3 (three) times daily.  Marland Kitchen FLAXSEED, LINSEED, PO Take 15 mLs by mouth.  . [DISCONTINUED] neomycin-polymyxin-dexamethasone (MAXITROL) 0.1 % ophthalmic suspension 1 drop 4 (four) times daily. Qid x 1 week and bid x 2 weeks   No facility-administered encounter medications on file as of 02/16/2020.    Review of Systems  Constitutional: Negative for activity change, appetite change, chills, diaphoresis, fatigue, fever and unexpected weight change.  Respiratory: Negative for cough, shortness of breath, wheezing and stridor.   Cardiovascular: Negative for chest pain, palpitations and leg swelling.  Gastrointestinal: Negative for abdominal distention, abdominal pain, constipation and diarrhea.  Genitourinary: Negative for difficulty urinating, dysuria and hematuria.  Musculoskeletal: Positive for gait problem. Negative for arthralgias, back pain, joint swelling and myalgias.  Neurological: Negative  for dizziness, seizures, syncope, facial asymmetry, speech difficulty, weakness and headaches.  Hematological: Negative for adenopathy. Does not bruise/bleed easily.  Psychiatric/Behavioral: Positive for confusion. Negative for agitation, behavioral problems, dysphoric mood and sleep disturbance. The patient is not nervous/anxious.     Immunization History  Administered Date(s) Administered  . Influenza Inj Mdck Quad Pf 08/09/2017  . Influenza, High Dose Seasonal PF 07/31/2016, 07/31/2019  . Influenza,inj,Quad PF,6+ Mos 08/06/2018  . Influenza-Unspecified 08/01/2010, 07/30/2012, 10/16/2012, 10/16/2013  . Moderna SARS-COVID-2 Vaccination 11/18/2019  . Pneumococcal Polysaccharide-23 02/23/2004, 02/21/2013  . Pneumococcal-Unspecified 03/17/2016  . Td 12/02/2008  . Tdap 09/01/2016  . Zoster 03/28/2006  . Zoster Recombinat (Shingrix) 10/29/2017, 01/24/2018   Pertinent  Health Maintenance Due  Topic Date Due  . HEMOGLOBIN A1C  12/29/2019  . INFLUENZA VACCINE  05/16/2020  . FOOT EXAM  10/30/2020  . PNA vac Low Risk Adult  Completed  . OPHTHALMOLOGY EXAM  Discontinued   Fall Risk  09/06/2019 01/04/2019 07/17/2018 08/21/2017 04/04/2017  Falls in the past  year? 1 Exclusion - non ambulatory No No No  Number falls in past yr: 0 - - - -  Injury with Fall? 0 - - - -  Risk for fall due to : History of fall(s) - - - -  Follow up Falls evaluation completed - - - -   Functional Status Survey:    Vitals:   02/16/20 1626  Weight: 155 lb 8 oz (70.5 kg)   Body mass index is 23.64 kg/m.  Wt Readings from Last 3 Encounters:  02/16/20 155 lb 8 oz (70.5 kg)  01/22/20 154 lb 12.8 oz (70.2 kg)  12/16/19 155 lb 11.2 oz (70.6 kg)    Physical Exam Vitals and nursing note reviewed.  Constitutional:      General: He is not in acute distress.    Appearance: He is not diaphoretic.  HENT:     Head: Normocephalic and atraumatic.  Eyes:     General:        Right eye: No discharge.        Left eye:  No discharge.     Conjunctiva/sclera: Conjunctivae normal.     Pupils: Pupils are equal, round, and reactive to light.  Neck:     Thyroid: No thyromegaly.     Vascular: No JVD.     Trachea: No tracheal deviation.  Cardiovascular:     Rate and Rhythm: Normal rate and regular rhythm.     Heart sounds: No murmur.  Pulmonary:     Effort: Pulmonary effort is normal. No respiratory distress.     Breath sounds: Normal breath sounds. No wheezing.  Abdominal:     General: Bowel sounds are normal. There is no distension.     Palpations: Abdomen is soft.     Tenderness: There is no abdominal tenderness.  Musculoskeletal:     Cervical back: Normal range of motion and neck supple.  Lymphadenopathy:     Cervical: No cervical adenopathy.  Skin:    General: Skin is warm and dry.  Neurological:     General: No focal deficit present.     Mental Status: He is alert. Mental status is at baseline.  Psychiatric:     Comments: flat     Labs reviewed: Recent Labs    04/08/19 0000 04/11/19 0000 01/12/20 0600  NA 143 141 141  K 3.9 3.3* 4.0  CL  --   --  104  CO2  --   --  25*  BUN 50* 17 20  CREATININE 1.5* 0.8 0.9  CALCIUM  --   --  9.7   No results for input(s): AST, ALT, ALKPHOS, BILITOT, PROT, ALBUMIN in the last 8760 hours. Recent Labs    04/04/19 0000 04/08/19 0000 01/12/20 0600  WBC 17.6 13.0 9.7  HGB 10.8* 10.8* 11.2*  HCT 33* 32* 34*  PLT 383 460* 331   Lab Results  Component Value Date   TSH 3.14 01/18/2018   Lab Results  Component Value Date   HGBA1C 6.6 07/01/2019   Lab Results  Component Value Date   CHOL 127 05/11/2017   HDL 50 05/11/2017   LDLCALC 58 05/11/2017   TRIG 98 05/11/2017    Significant Diagnostic Results in last 30 days:  No results found.  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   2. Mixed Alzheimer's and vascular dementia ([place]) Progressive decline in cognition and physical function  c/w the disease. Continue supportive care in the skilled environment. Continues  on Namenda.   3. Essential hypertension Controlled. Continue Norvasc 10 mg qd and Micardis 20 mg qd   4. Cerebrovascular disease Continues on Plavix and aspirin per neurology. We are no longer monitoring lipids due to his age/debility   5. Chronic indwelling Foley catheter Doing well with no hematuria at this time. [facility] by [facility].   6. Benign prostatic hyperplasia with urinary retention As above.   7. Gastroesophageal reflux disease without esophagitis No current symptoms, could consider tapering protonix in the next few months   Family/ staff Communication: nurse and resident   Labs/tests ordered:  NA

## 2020-02-24 DIAGNOSIS — H02005 Unspecified entropion of left lower eyelid: Secondary | ICD-10-CM | POA: Diagnosis not present

## 2020-04-09 ENCOUNTER — Encounter: Payer: Self-pay | Admitting: Adult Health

## 2020-04-09 ENCOUNTER — Non-Acute Institutional Stay (SKILLED_NURSING_FACILITY): Payer: Medicare Other | Admitting: Adult Health

## 2020-04-09 DIAGNOSIS — E119 Type 2 diabetes mellitus without complications: Secondary | ICD-10-CM | POA: Diagnosis not present

## 2020-04-09 DIAGNOSIS — G309 Alzheimer's disease, unspecified: Secondary | ICD-10-CM | POA: Diagnosis not present

## 2020-04-09 DIAGNOSIS — G214 Vascular parkinsonism: Secondary | ICD-10-CM | POA: Diagnosis not present

## 2020-04-09 DIAGNOSIS — K219 Gastro-esophageal reflux disease without esophagitis: Secondary | ICD-10-CM

## 2020-04-09 DIAGNOSIS — F0280 Dementia in other diseases classified elsewhere without behavioral disturbance: Secondary | ICD-10-CM

## 2020-04-09 DIAGNOSIS — F4321 Adjustment disorder with depressed mood: Secondary | ICD-10-CM

## 2020-04-09 DIAGNOSIS — F0150 Vascular dementia without behavioral disturbance: Secondary | ICD-10-CM

## 2020-04-09 DIAGNOSIS — I10 Essential (primary) hypertension: Secondary | ICD-10-CM | POA: Diagnosis not present

## 2020-04-09 NOTE — Progress Notes (Addendum)
Location:  Occupational psychologist of Service:  SNF (31) Provider:   Cindi Carbon, ANP [place] [phone]   [name], DO  Patient Care Team: [name], DO as PCP - General (Geriatric Medicine) Danella Sensing, MD as Consulting Physician (Dermatology) Sharyne Peach, MD as Consulting Physician (Ophthalmology) Irine Seal, MD as Attending Physician (Urology) [name], MD as Consulting Physician (Neurology)  Extended Emergency Contact Information Primary Emergency Contact: Chittum,[name] Address: [address]          [place] [address] [name] of [place] Phone: [phone] Relation: Spouse Secondary Emergency Contact: [name] States of [place] Mobile Phone: [phone] Relation: Daughter  Code Status:  DNR Goals of care: Advanced Directive information Advanced Directives 12/16/2019  Does Patient Have a Medical Advance Directive? Yes  Type of Advance Directive Out of facility DNR (pink MOST or yellow form)  Does patient want to make changes to medical advance directive? No - Patient declined  Copy of [place] in Chart? -  Would patient like information on creating a medical advance directive? -  Pre-existing out of facility DNR order (yellow form or pink MOST form) -     Chief Complaint  Patient presents with  . Medical Management of Chronic Issues    HPI:  Pt is a 86 y.o. male seen today for medical management of chronic diseases.    Kenneth Stark has a hx of mixed Vascular/Alz dementia. He requires a lift for transfers and assistance with all ADLs. He remains verbal and able to help feed himself. There are no acute complaints regarding his care. He is followed by neurology for parkinson's. He has some rigidity to his lower extremities which is unchanged. No tremor or BP issues noted. He is on zoloft for depression. He denies any feelings of depression, appetite is  fair, sleep is adequate. BP is controlled.   Wt Readings from Last 3 Encounters:  04/09/20 155 lb 6.4 oz (70.5 kg)  02/16/20 155 lb 8 oz (70.5 kg)  01/22/20 154 lb 12.8 oz (70.2 kg)    Past Medical History:  Diagnosis Date  . Abnormality of gait   . Arthritis   . Brain bleed ([place]) 09/01/2016  . Cervical spondylosis   . Controlled type 2 diabetes mellitus without complication, without long-term current use of insulin ([place]) 06/24/2019  . Degenerative joint disease (DJD) of lumbar spine   . Dementia ([place]) 04/28/2019  . Depression   . Diplopia   . Dyslipidemia   . Essential tremor   . Foul smelling urine   . Frequent falls   . GERD (gastroesophageal reflux disease)   . Glycosuria   . Hearing difficulty    hearing aids  . Hyperlipidemia   . Hypertension   . Hyperthyroidism   . Memory loss   . Osteoarthritis   . Radiculopathy, lumbar region   . 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 Small vessel disease ([place])   . TIA (transient ischemic attack)    Past Surgical History:  Procedure Laterality Date  . APPENDECTOMY     done as a child  . GALLBLADDER SURGERY  2008  . KNEE ARTHROSCOPY Left    Dr. Hart Robinsons 2002  . knee injections Right    Dr. Adriana Mccallum  . TONSILLECTOMY     done as a child    No Known Allergies  Outpatient Encounter Medications as  of 04/09/2020  Medication Sig  . amLODipine (NORVASC) 10 MG tablet Take 1 tablet (10 mg total) by mouth daily.  Marland Kitchen ammonium lactate (AMLACTIN) 12 % cream Apply 1 Bottle topically at bedtime.   Marland Kitchen aspirin EC 81 MG tablet Take 81 mg by mouth daily.  . carbidopa-levodopa (SINEMET IR) 25-100 MG tablet Take 1.5 tablets by mouth 3 (three) times daily.  . carboxymethylcellulose (REFRESH PLUS) 0.5 % SOLN Apply 2 drops to eye as needed (dry eyes).   . clopidogrel (PLAVIX) 75 MG tablet Take 75 mg by mouth daily.  . Cranberry 400 MG CAPS Take 1 capsule by mouth 3 (three) times daily.  Marland Kitchen FLAXSEED, LINSEED, PO  Take 15 mLs by mouth.  Marland Kitchen ipratropium-albuterol (DUONEB) 0.5-2.5 (3) MG/3ML SOLN Take 3 mLs by nebulization every 8 (eight) hours as needed.  Marland Kitchen MELATONIN-CHAMOMILE PO Take by mouth at bedtime as needed.  . Memantine HCl-Donepezil HCl (NAMZARIC) 28-10 MG CP24 Take 1 capsule at bedtime by mouth.   . Menthol, Topical Analgesic, (BIOFREEZE) 4 % GEL Apply 1 application topically 2 (two) times daily as needed (pain).   Marland Kitchen olopatadine (PATADAY) 0.1 % ophthalmic solution Place 1 drop into both eyes 2 (two) times daily.  . ondansetron (ZOFRAN) 4 MG tablet Take 4 mg by mouth every 8 (eight) hours as needed for nausea or vomiting.  . pantoprazole (PROTONIX) 40 MG tablet Take 1 tablet (40 mg total) by mouth daily.  . polyethylene glycol (MIRALAX / GLYCOLAX) packet Take 17 g by mouth every other day.  . senna-docusate (SENOKOT-S) 8.6-50 MG tablet Take 2 tablets 2 (two) times daily by mouth.   . sertraline (ZOLOFT) 50 MG tablet Take 50 mg by mouth at bedtime.  Marland Kitchen telmisartan (MICARDIS) 20 MG tablet Take 20 mg by mouth daily.  Marland Kitchen triamcinolone (NASACORT) 55 MCG/ACT AERO nasal inhaler Place 2 sprays into the nose at bedtime.   No facility-administered encounter medications on file as of 04/09/2020.    Review of Systems  Constitutional: Negative for activity change, appetite change, chills, diaphoresis, fatigue, fever and unexpected weight change.  Respiratory: Negative for cough, shortness of breath, wheezing and stridor.   Cardiovascular: Negative for chest pain, palpitations and leg swelling.  Gastrointestinal: Negative for abdominal distention, abdominal pain, constipation and diarrhea.  Genitourinary: Negative for difficulty urinating and dysuria.       Has a catheter  Musculoskeletal: Positive for gait problem. Negative for arthralgias, back pain, joint swelling and myalgias.  Skin: Negative for wound.  Neurological: Negative for dizziness, seizures, syncope, facial asymmetry, speech difficulty, weakness  and headaches.  Hematological: Negative for adenopathy. Does not bruise/bleed easily.  Psychiatric/Behavioral: Positive for confusion. Negative for agitation and behavioral problems.    Immunization History  Administered Date(s) Administered  . Influenza Inj Mdck Quad Pf 08/09/2017  . Influenza, High Dose Seasonal PF 07/31/2016, 07/31/2019  . Influenza,inj,Quad PF,6+ Mos 08/06/2018  . Influenza-Unspecified 08/01/2010, 07/30/2012, 10/16/2012, 10/16/2013  . Moderna SARS-COVID-2 Vaccination 11/18/2019  . Pneumococcal Polysaccharide-23 02/23/2004, 02/21/2013  . Pneumococcal-Unspecified 03/17/2016  . Td 12/02/2008  . Tdap 09/01/2016  . Zoster 03/28/2006  . Zoster Recombinat (Shingrix) 10/29/2017, 01/24/2018   Pertinent  Health Maintenance Due  Topic Date Due  . INFLUENZA VACCINE  05/16/2020  . HEMOGLOBIN A1C  05/26/2020  . FOOT EXAM  10/30/2020  . PNA vac Low Risk Adult  Completed  . OPHTHALMOLOGY EXAM  Discontinued   Fall Risk  04/09/2020 09/06/2019 01/04/2019 07/17/2018 08/21/2017  Falls in the past year? 0 1 Exclusion -  non ambulatory No No  Number falls in past yr: 0 0 - - -  Injury with Fall? 0 0 - - -  Risk for fall due to : Impaired balance/gait History of fall(s) - - -  Follow up Falls evaluation completed Falls evaluation completed - - -   Functional Status Survey:    Vitals:   04/09/20 0922  Weight: 164 lb 3.2 oz (74.5 kg)   Body mass index is 24.97 kg/m. Physical Exam Vitals and nursing note reviewed.  Constitutional:      General: He is not in acute distress.    Appearance: He is not diaphoretic.  HENT:     Head: Normocephalic and atraumatic.  Neck:     Thyroid: No thyromegaly.     Vascular: No JVD.     Trachea: No tracheal deviation.  Cardiovascular:     Rate and Rhythm: Normal rate and regular rhythm.     Heart sounds: No murmur heard.   Pulmonary:     Effort: Pulmonary effort is normal. No respiratory distress.     Breath sounds: Normal breath sounds.  No wheezing.  Abdominal:     General: Bowel sounds are normal. There is no distension.     Palpations: Abdomen is soft.     Tenderness: There is no abdominal tenderness.  Genitourinary:    Comments: Cath with yellow urine and sediment Musculoskeletal:     Right lower leg: No edema.     Left lower leg: No edema.     Comments: Strength RUE 5/5, LUE 4/5. BLE 3/5  Lymphadenopathy:     Cervical: No cervical adenopathy.  Skin:    General: Skin is warm and dry.  Neurological:     Mental Status: He is alert.     Cranial Nerves: No cranial nerve deficit.     Comments: Oriented to self and place, not time. Rigidity noted to BLE.      Labs reviewed: Recent Labs    04/11/19 0000 01/12/20 0000 01/12/20 0600  NA 141 141  --   K 3.3* 4.0  --   CL  --  104  --   CO2  --  25*  --   BUN 17 20  --   CREATININE 0.8 0.9  --   CALCIUM  --   --  9.7   No results for input(s): AST, ALT, ALKPHOS, BILITOT, PROT, ALBUMIN in the last 8760 hours. Recent Labs    01/12/20 0000  WBC 9.7  HGB 11.2*  HCT 34*  PLT 331   Lab Results  Component Value Date   TSH 3.14 01/18/2018   Lab Results  Component Value Date   HGBA1C 7.4 11/27/2019   Lab Results  Component Value Date   CHOL 127 05/11/2017   HDL 50 05/11/2017   LDLCALC 58 05/11/2017   TRIG 98 05/11/2017    Significant Diagnostic Results in last 30 days:  No results found.  Assessment/Plan  1. Controlled type 2 diabetes mellitus without complication, without long-term current use of insulin ([place]) Diet controlled Lab Results  Component Value Date   HGBA1C 7.4 11/27/2019    2. Mixed Alzheimer's and vascular dementia ([place]) Progressive decline, moderate  Continues on Namzaric. Continue supportive care in the skilled environment   3. Essential hypertension Controled Continue micardis 20 mg qd and Norvasc 10 mg qd   4. Gastroesophageal reflux disease without esophagitis No symptoms, continue Protonix 40 mg qd   5. Vascular  parkinsonism ([place]) Increased rigidity to  his lowe extremities likely due to disuse. Continue current dose of sinemet   6. Situational depression There are no current symptoms of depression but this resident does have the appearance of apathy which may be due to his dementia but it is difficult to differentiate between dementia/depression. Will continue to monitor.    Family/ staff Communication: discussed with the resident and his nurse Misti  Labs/tests ordered:  NA

## 2020-04-14 NOTE — Telephone Encounter (Signed)
This encounter was created in error - please disregard.

## 2020-05-05 ENCOUNTER — Non-Acute Institutional Stay (SKILLED_NURSING_FACILITY): Payer: Medicare Other | Admitting: Internal Medicine

## 2020-05-05 ENCOUNTER — Encounter: Payer: Self-pay | Admitting: Internal Medicine

## 2020-05-05 DIAGNOSIS — I10 Essential (primary) hypertension: Secondary | ICD-10-CM | POA: Diagnosis not present

## 2020-05-05 DIAGNOSIS — R21 Rash and other nonspecific skin eruption: Secondary | ICD-10-CM

## 2020-05-05 NOTE — Progress Notes (Signed)
Location:  [facility] of Service:  SNF (31) Provider:  Damian Buckles L. Mariea Clonts, D.O., C.M.D.  [name], DO  Patient Care Team: [name], DO as PCP - General (Geriatric Medicine) Danella Sensing, MD as Consulting Physician (Dermatology) Sharyne Peach, MD as Consulting Physician (Ophthalmology) Irine Seal, MD as Attending Physician (Urology) [name], MD as Consulting Physician (Neurology)  Extended Emergency Contact Information Primary Emergency Contact: Schnabel,[name] Address: [address]          [place] [address] [name] of [place] Phone: [phone] Relation: Spouse Secondary Emergency Contact: [name] [place] of [place] Mobile Phone: [phone] Relation: Daughter  Code Status:  DNR Goals of care: Advanced Directive information Advanced Directives 12/16/2019  Does Patient Have a Medical Advance Directive? Yes  Type of Advance Directive Out of facility DNR (pink MOST or yellow form)  Does patient want to make changes to medical advance directive? No - Patient declined  Copy of [place] in Chart? -  Would patient like information on creating a medical advance directive? -  Pre-existing out of facility DNR order (yellow form or pink MOST form) -     Chief Complaint  Patient presents with  . Acute Visit    pruritic rash around right eye, cheek, nose; high bp    HPI:  Pt is a 86 y.o. male seen today for an acute visit for pruritic rash beneath right eye on cheek and single lesion on right nare.  He is sitting in his wheelchair itching his face.  He also has headache.  He denies pain in his eye or itching of the eye itself--it is not red and there are no lesions on the lids.    When nurse manager pulled vitals for visit, she noted that this month all but one set have shown hypertension.  He is only on amlodipine 10mg  daily.   I note that he runs a little bradycardic.    Past  Medical History:  Diagnosis Date  . Abnormality of gait   . Arthritis   . Brain bleed ([place]) 09/01/2016  . Cervical spondylosis   . Controlled type 2 diabetes mellitus without complication, without long-term current use of insulin ([place]) 06/24/2019  . Degenerative joint disease (DJD) of lumbar spine   . Dementia ([place]) 04/28/2019  . Depression   . Diplopia   . Dyslipidemia   . Essential tremor   . Foul smelling urine   . Frequent falls   . GERD (gastroesophageal reflux disease)   . Glycosuria   . Hearing difficulty    hearing aids  . Hyperlipidemia   . Hypertension   . Hyperthyroidism   . Memory loss   . Osteoarthritis   . Radiculopathy, lumbar region   . 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 Small vessel disease ([place])   . TIA (transient ischemic attack)    Past Surgical History:  Procedure Laterality Date  . APPENDECTOMY     done as a child  . GALLBLADDER SURGERY  2008  . KNEE ARTHROSCOPY Left    Dr. Hart Robinsons 2002  . knee injections Right    Dr. Adriana Mccallum  . TONSILLECTOMY     done as a child    No Known Allergies  Outpatient Encounter Medications as of 05/05/2020  Medication Sig  . amLODipine (NORVASC) 10 MG tablet Take 1 tablet (10 mg total) by mouth daily.  Marland Kitchen  ammonium lactate (AMLACTIN) 12 % cream Apply 1 Bottle topically at bedtime.   Marland Kitchen aspirin EC 81 MG tablet Take 81 mg by mouth daily.  . carbidopa-levodopa (SINEMET IR) 25-100 MG tablet Take 1.5 tablets by mouth 3 (three) times daily.  . carboxymethylcellulose (REFRESH PLUS) 0.5 % SOLN Apply 2 drops to eye as needed (dry eyes).   . clopidogrel (PLAVIX) 75 MG tablet Take 75 mg by mouth daily.  . Cranberry 400 MG CAPS Take 1 capsule by mouth 3 (three) times daily.  Marland Kitchen FLAXSEED, LINSEED, PO Take 15 mLs by mouth.  Marland Kitchen ipratropium-albuterol (DUONEB) 0.5-2.5 (3) MG/3ML SOLN Take 3 mLs by nebulization every 8 (eight) hours as needed.  Marland Kitchen MELATONIN-CHAMOMILE PO Take by mouth at bedtime as  needed.  . Memantine HCl-Donepezil HCl (NAMZARIC) 28-10 MG CP24 Take 1 capsule at bedtime by mouth.   . Menthol, Topical Analgesic, (BIOFREEZE) 4 % GEL Apply 1 application topically 2 (two) times daily as needed (pain).   Marland Kitchen olopatadine (PATADAY) 0.1 % ophthalmic solution Place 1 drop into both eyes 2 (two) times daily.  . ondansetron (ZOFRAN) 4 MG tablet Take 4 mg by mouth every 8 (eight) hours as needed for nausea or vomiting.  . pantoprazole (PROTONIX) 40 MG tablet Take 1 tablet (40 mg total) by mouth daily.  . polyethylene glycol (MIRALAX / GLYCOLAX) packet Take 17 g by mouth every other day.  . senna-docusate (SENOKOT-S) 8.6-50 MG tablet Take 2 tablets 2 (two) times daily by mouth.   . sertraline (ZOLOFT) 50 MG tablet Take 50 mg by mouth at bedtime.  Marland Kitchen telmisartan (MICARDIS) 20 MG tablet Take 20 mg by mouth daily.  Marland Kitchen triamcinolone (NASACORT) 55 MCG/ACT AERO nasal inhaler Place 2 sprays into the nose at bedtime.   No facility-administered encounter medications on file as of 05/05/2020.    Review of Systems  Constitutional: Negative for chills and fever.  HENT: Positive for hearing loss.   Eyes: Negative for blurred vision, pain, discharge and redness.  Skin: Positive for itching and rash.  Neurological: Positive for headaches. Negative for tingling and sensory change.       No burning  Psychiatric/Behavioral: Positive for memory loss.       Not himself today     Immunization History  Administered Date(s) Administered  . Influenza Inj Mdck Quad Pf 08/09/2017  . Influenza, High Dose Seasonal PF 07/31/2016, 07/31/2019  . Influenza,inj,Quad PF,6+ Mos 08/06/2018  . Influenza-Unspecified 08/01/2010, 07/30/2012, 10/16/2012, 10/16/2013  . Moderna SARS-COVID-2 Vaccination 11/18/2019  . Pneumococcal Polysaccharide-23 02/23/2004, 02/21/2013  . Pneumococcal-Unspecified 03/17/2016  . Td 12/02/2008  . Tdap 09/01/2016  . Zoster 03/28/2006  . Zoster Recombinat (Shingrix) 10/29/2017,  01/24/2018   Pertinent  Health Maintenance Due  Topic Date Due  . INFLUENZA VACCINE  05/16/2020  . HEMOGLOBIN A1C  05/26/2020  . FOOT EXAM  10/30/2020  . PNA vac Low Risk Adult  Completed  . OPHTHALMOLOGY EXAM  Discontinued   Fall Risk  04/09/2020 09/06/2019 01/04/2019 07/17/2018 08/21/2017  Falls in the past year? 0 1 Exclusion - non ambulatory No No  Number falls in past yr: 0 0 - - -  Injury with Fall? 0 0 - - -  Risk for fall due to : Impaired balance/gait History of fall(s) - - -  Follow up Falls evaluation completed Falls evaluation completed - - -   Functional Status Survey:    Vitals:   05/05/20 1731  BP: (!) 170/72  Pulse: 62  Resp: 18  Temp: 97.7  F (36.5 C)  SpO2: 99%   There is no height or weight on file to calculate BMI. Physical Exam Vitals reviewed.  Constitutional:      General: He is not in acute distress.    Appearance: He is not toxic-appearing.  HENT:     Head: Normocephalic and atraumatic.  Eyes:     General: No scleral icterus.       Right eye: No discharge.        Left eye: No discharge.     Extraocular Movements: Extraocular movements intact.     Conjunctiva/sclera: Conjunctivae normal.     Pupils: Pupils are equal, round, and reactive to light.  Skin:    Comments: About a half dozen papules on right cheek beneath eye and on right nare--raised, erythematous, no vesicles, no surrounding erythema, no drainage, none in eye and lids look normal, no conjunctival erythema or discharge  Neurological:     Mental Status: He is alert. Mental status is at baseline.  Psychiatric:        Mood and Affect: Mood normal.     Comments: Was talking to me and thanked me for seeing him, wanted to shake my hand today     Labs reviewed: Recent Labs    01/12/20 0000 01/12/20 0600  NA 141  --   K 4.0  --   CL 104  --   CO2 25*  --   BUN 20  --   CREATININE 0.9  --   CALCIUM  --  9.7   No results for input(s): AST, ALT, ALKPHOS, BILITOT, PROT, ALBUMIN in  the last 8760 hours. Recent Labs    01/12/20 0000  WBC 9.7  HGB 11.2*  HCT 34*  PLT 331   Lab Results  Component Value Date   TSH 3.14 01/18/2018   Lab Results  Component Value Date   HGBA1C 7.4 11/27/2019   Lab Results  Component Value Date   CHOL 127 05/11/2017   HDL 50 05/11/2017   LDLCALC 58 05/11/2017   TRIG 98 05/11/2017    Assessment/Plan 1. Facial rash -does not appear to be shingles--all lesions are papules in same stage and only pruritic -should this evolve and vesicles and pain ensure, will need ophtho visit asap -for now, treat with hydrocortisone 0.5% to the affected area bid until resolved -if does not help, will need reassessment; if still not better, derm  2. Uncontrolled hypertension -over the month -check bp and HR bid for one week -add hydralazine 25mg  po daily prn SBP >160  Family/ staff Communication: discussed with nurse manager  Labs/tests ordered:  none  Davon Folta L. Chancy Smigiel, D.O. [facility] [address] [place], [place] [address] Cell Phone (Mon-Fri 8am-5pm):  [phone] On Call:  [phone] & follow prompts after 5pm & weekends Office Phone:  [phone] Office Fax:  [phone]

## 2020-05-07 ENCOUNTER — Non-Acute Institutional Stay (SKILLED_NURSING_FACILITY): Payer: Medicare Other | Admitting: Adult Health

## 2020-05-07 ENCOUNTER — Encounter: Payer: Self-pay | Admitting: Adult Health

## 2020-05-07 DIAGNOSIS — B0229 Other postherpetic nervous system involvement: Secondary | ICD-10-CM | POA: Diagnosis not present

## 2020-05-07 DIAGNOSIS — B029 Zoster without complications: Secondary | ICD-10-CM

## 2020-05-07 DIAGNOSIS — B0239 Other herpes zoster eye disease: Secondary | ICD-10-CM | POA: Diagnosis not present

## 2020-05-07 NOTE — Progress Notes (Signed)
Location:  Occupational psychologist of Service:  SNF (31) Provider:   Cindi Carbon, ANP [place] [phone]  [name], DO  Patient Care Team: [name], DO as PCP - General (Geriatric Medicine) Danella Sensing, MD as Consulting Physician (Dermatology) Sharyne Peach, MD as Consulting Physician (Ophthalmology) Irine Seal, MD as Attending Physician (Urology) [name], MD as Consulting Physician (Neurology)  Extended Emergency Contact Information Primary Emergency Contact: Fenn,[name] Address: [address]          [place] [address] [name] of [place] Phone: [phone] Relation: Spouse Secondary Emergency Contact: [name] [place] of [place] Mobile Phone: [phone] Relation: Daughter  Code Status:  DNR Goals of care: Advanced Directive information Advanced Directives 12/16/2019  Does Patient Have a Medical Advance Directive? Yes  Type of Advance Directive Out of facility DNR (pink MOST or yellow form)  Does patient want to make changes to medical advance directive? No - Patient declined  Copy of [place] in Chart? -  Would patient like information on creating a medical advance directive? -  Pre-existing out of facility DNR order (yellow form or pink MOST form) -     Chief Complaint  Patient presents with  . Acute Visit    painful rash    HPI:  Pt is a 86 y.o. male seen today for an acute visit for pain to the right ear and rash. Mr. Borquez has had a papular rash for 3 days per the nurse and was seen for this issue on 7/21 and prescribed hydrocortisone. There was no pain at that time and no vesicles. The nurse reports he has been grimacing when his ear is touch. The rash remains the same, present to around the right eye with erythematous papules but no vesicles. He has not had a fever. He is not eating well due to pain per the staff.    Past Medical History:    Diagnosis Date  . Abnormality of gait   . Arthritis   . Brain bleed ([place]) 09/01/2016  . Cervical spondylosis   . Controlled type 2 diabetes mellitus without complication, without long-term current use of insulin ([place]) 06/24/2019  . Degenerative joint disease (DJD) of lumbar spine   . Dementia ([place]) 04/28/2019  . Depression   . Diplopia   . Dyslipidemia   . Essential tremor   . Foul smelling urine   . Frequent falls   . GERD (gastroesophageal reflux disease)   . Glycosuria   . Hearing difficulty    hearing aids  . Hyperlipidemia   . Hypertension   . Hyperthyroidism   . Memory loss   . Osteoarthritis   . Radiculopathy, lumbar region   . 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 Small vessel disease ([place])   . TIA (transient ischemic attack)    Past Surgical History:  Procedure Laterality Date  . APPENDECTOMY     done as a child  . GALLBLADDER SURGERY  2008  . KNEE ARTHROSCOPY Left    Dr. Hart Robinsons 2002  . knee injections Right    Dr. Adriana Mccallum  . TONSILLECTOMY     done as a child    No Known Allergies  Outpatient Encounter Medications as of 05/07/2020  Medication Sig  . gabapentin (NEURONTIN) 100 MG capsule Take 100 mg by mouth 2 (two) times daily.  . hydrALAZINE (APRESOLINE) 25 MG tablet Take  25 mg by mouth daily as needed. SBP>160  . valACYclovir (VALTREX) 1000 MG tablet Take 1,000 mg by mouth 3 (three) times daily.  Marland Kitchen amLODipine (NORVASC) 10 MG tablet Take 1 tablet (10 mg total) by mouth daily.  Marland Kitchen ammonium lactate (AMLACTIN) 12 % cream Apply 1 Bottle topically at bedtime.   Marland Kitchen aspirin EC 81 MG tablet Take 81 mg by mouth daily.  . carbidopa-levodopa (SINEMET IR) 25-100 MG tablet Take 1.5 tablets by mouth 3 (three) times daily.  . carboxymethylcellulose (REFRESH PLUS) 0.5 % SOLN Apply 2 drops to eye as needed (dry eyes).   . clopidogrel (PLAVIX) 75 MG tablet Take 75 mg by mouth daily.  . Cranberry 400 MG CAPS Take 1 capsule by mouth 3  (three) times daily.  Marland Kitchen FLAXSEED, LINSEED, PO Take 15 mLs by mouth.  Marland Kitchen ipratropium-albuterol (DUONEB) 0.5-2.5 (3) MG/3ML SOLN Take 3 mLs by nebulization every 8 (eight) hours as needed.  Marland Kitchen MELATONIN-CHAMOMILE PO Take by mouth at bedtime as needed.  . Memantine HCl-Donepezil HCl (NAMZARIC) 28-10 MG CP24 Take 1 capsule at bedtime by mouth.   . Menthol, Topical Analgesic, (BIOFREEZE) 4 % GEL Apply 1 application topically 2 (two) times daily as needed (pain).   Marland Kitchen olopatadine (PATADAY) 0.1 % ophthalmic solution Place 1 drop into both eyes 2 (two) times daily.  . ondansetron (ZOFRAN) 4 MG tablet Take 4 mg by mouth every 8 (eight) hours as needed for nausea or vomiting.  . pantoprazole (PROTONIX) 40 MG tablet Take 1 tablet (40 mg total) by mouth daily.  . polyethylene glycol (MIRALAX / GLYCOLAX) packet Take 17 g by mouth every other day.  . senna-docusate (SENOKOT-S) 8.6-50 MG tablet Take 2 tablets 2 (two) times daily by mouth.   . sertraline (ZOLOFT) 50 MG tablet Take 50 mg by mouth at bedtime.  Marland Kitchen telmisartan (MICARDIS) 20 MG tablet Take 20 mg by mouth daily.  Marland Kitchen triamcinolone (NASACORT) 55 MCG/ACT AERO nasal inhaler Place 2 sprays into the nose at bedtime.   No facility-administered encounter medications on file as of 05/07/2020.    Review of Systems  Constitutional: Positive for appetite change. Negative for activity change, diaphoresis, fatigue and fever.  HENT: Positive for ear pain. Negative for congestion, ear discharge and postnasal drip.   Eyes: Negative for photophobia, pain, discharge, redness, itching and visual disturbance.  Respiratory: Positive for cough (chronic ). Negative for shortness of breath, wheezing and stridor.   Cardiovascular: Negative for chest pain, palpitations and leg swelling.  Gastrointestinal: Negative for abdominal pain, constipation and diarrhea.  Musculoskeletal: Positive for gait problem.  Skin: Positive for rash (painful rash to right eye area ).    Psychiatric/Behavioral: Positive for confusion.    Immunization History  Administered Date(s) Administered  . Influenza Inj Mdck Quad Pf 08/09/2017  . Influenza, High Dose Seasonal PF 07/31/2016, 07/31/2019  . Influenza,inj,Quad PF,6+ Mos 08/06/2018  . Influenza-Unspecified 08/01/2010, 07/30/2012, 10/16/2012, 10/16/2013  . Moderna SARS-COVID-2 Vaccination 11/18/2019  . Pneumococcal Polysaccharide-23 02/23/2004, 02/21/2013  . Pneumococcal-Unspecified 03/17/2016  . Td 12/02/2008  . Tdap 09/01/2016  . Zoster 03/28/2006  . Zoster Recombinat (Shingrix) 10/29/2017, 01/24/2018   Pertinent  Health Maintenance Due  Topic Date Due  . INFLUENZA VACCINE  05/16/2020  . HEMOGLOBIN A1C  05/26/2020  . FOOT EXAM  10/30/2020  . PNA vac Low Risk Adult  Completed  . OPHTHALMOLOGY EXAM  Discontinued   Fall Risk  04/09/2020 09/06/2019 01/04/2019 07/17/2018 08/21/2017  Falls in the past year? 0 1 Exclusion - non  ambulatory No No  Number falls in past yr: 0 0 - - -  Injury with Fall? 0 0 - - -  Risk for fall due to : Impaired balance/gait History of fall(s) - - -  Follow up Falls evaluation completed Falls evaluation completed - - -   Functional Status Survey:    There were no vitals filed for this visit. There is no height or weight on file to calculate BMI. Physical Exam Vitals and nursing note reviewed.  Constitutional:      Appearance: Normal appearance.  HENT:     Head: Normocephalic and atraumatic.     Right Ear: Tympanic membrane, ear canal and external ear normal.     Left Ear: Tympanic membrane, ear canal and external ear normal.     Ears:     Comments: Pain when the right ear is touched    Nose: Nose normal. No congestion.     Mouth/Throat:     Mouth: Mucous membranes are moist.     Pharynx: Oropharynx is clear.  Eyes:     General:        Right eye: No discharge.        Left eye: No discharge.     Conjunctiva/sclera: Conjunctivae normal.     Pupils: Pupils are equal, round,  and reactive to light.  Cardiovascular:     Rate and Rhythm: Normal rate and regular rhythm.     Heart sounds: No murmur heard.   Pulmonary:     Effort: Pulmonary effort is normal.     Breath sounds: Normal breath sounds.  Abdominal:     General: Bowel sounds are normal.     Palpations: Abdomen is soft.  Musculoskeletal:     Cervical back: No rigidity.  Lymphadenopathy:     Cervical: No cervical adenopathy.  Skin:    General: Skin is warm and dry.     Findings: Rash (papular and tender rash noted around the right periorbital area) present.  Neurological:     General: No focal deficit present.     Mental Status: He is alert. Mental status is at baseline.     Labs reviewed: Recent Labs    01/12/20 0000 01/12/20 0600  NA 141  --   K 4.0  --   CL 104  --   CO2 25*  --   BUN 20  --   CREATININE 0.9  --   CALCIUM  --  9.7   No results for input(s): AST, ALT, ALKPHOS, BILITOT, PROT, ALBUMIN in the last 8760 hours. Recent Labs    01/12/20 0000  WBC 9.7  HGB 11.2*  HCT 34*  PLT 331   Lab Results  Component Value Date   TSH 3.14 01/18/2018   Lab Results  Component Value Date   HGBA1C 7.4 11/27/2019   Lab Results  Component Value Date   CHOL 127 05/11/2017   HDL 50 05/11/2017   LDLCALC 58 05/11/2017   TRIG 98 05/11/2017    Significant Diagnostic Results in last 30 days:  No results found.  Assessment/Plan 1. Herpes zoster without complication Valtrex 4098 mg q 8 hrs x 7 days Ophthalmology referral due to rash extending around the eye ASAP Contact isolation with no pregnant staff until rash improves    2. HZV (herpes zoster virus) post herpetic neuralgia Neurontin 100 mg bid x 14 days    Family/ staff Communication: discussed with his wife Dot  Labs/tests ordered:  NA

## 2020-05-13 DIAGNOSIS — B0239 Other herpes zoster eye disease: Secondary | ICD-10-CM | POA: Diagnosis not present

## 2020-05-21 ENCOUNTER — Non-Acute Institutional Stay (SKILLED_NURSING_FACILITY): Payer: Medicare Other | Admitting: Adult Health

## 2020-05-21 DIAGNOSIS — I10 Essential (primary) hypertension: Secondary | ICD-10-CM

## 2020-05-21 DIAGNOSIS — G309 Alzheimer's disease, unspecified: Secondary | ICD-10-CM | POA: Diagnosis not present

## 2020-05-21 DIAGNOSIS — R338 Other retention of urine: Secondary | ICD-10-CM

## 2020-05-21 DIAGNOSIS — I679 Cerebrovascular disease, unspecified: Secondary | ICD-10-CM | POA: Diagnosis not present

## 2020-05-21 DIAGNOSIS — Z978 Presence of other specified devices: Secondary | ICD-10-CM

## 2020-05-21 DIAGNOSIS — N401 Enlarged prostate with lower urinary tract symptoms: Secondary | ICD-10-CM

## 2020-05-21 DIAGNOSIS — F0150 Vascular dementia without behavioral disturbance: Secondary | ICD-10-CM

## 2020-05-21 DIAGNOSIS — B029 Zoster without complications: Secondary | ICD-10-CM

## 2020-05-21 DIAGNOSIS — F0280 Dementia in other diseases classified elsewhere without behavioral disturbance: Secondary | ICD-10-CM

## 2020-05-21 NOTE — Progress Notes (Signed)
D/C neuro  Location:  Occupational psychologist of Service:  SNF (31) Provider:   Cindi Carbon, ANP [place] [phone]   [name], DO  Patient Care Team: [name], DO as PCP - General (Geriatric Medicine) Danella Sensing, MD as Consulting Physician (Dermatology) Sharyne Peach, MD as Consulting Physician (Ophthalmology) Irine Seal, MD as Attending Physician (Urology) [name], MD as Consulting Physician (Neurology)  Extended Emergency Contact Information Primary Emergency Contact: Limehouse,[name] Address: [address]          [place] [address] [name] of [place] Phone: [phone] Relation: Spouse Secondary Emergency Contact: [name] [place] of [place] Mobile Phone: [phone] Relation: Daughter  Code Status:  DNR Goals of care: Advanced Directive information Advanced Directives 12/16/2019  Does Patient Have a Medical Advance Directive? Yes  Type of Advance Directive Out of facility DNR (pink MOST or yellow form)  Does patient want to make changes to medical advance directive? No - Patient declined  Copy of [place] in Chart? -  Would patient like information on creating a medical advance directive? -  Pre-existing out of facility DNR order (yellow form or pink MOST form) -     Chief Complaint  Patient presents with  . Medical Management of Chronic Issues    HPI:  Pt is a 86 y.o. male seen today for medical management of chronic diseases.    Mixed AD/Vascular dementia: He needs assistance with all ADLs at this point. He is sleeping more per the staff. Intermittently has low intake. Weight is stable. He has a hx of TIA, small vessel disease. Takes asa and plavix with no reports of bleeding or bruising.   He as treated for herpes zoster in July around the right eye area. This has resolved. He continues on neurontin for pain   No prn hydralazine has been used in  the past week for HTN. BP has times in the the 150-160 range. No cp, sob, doe, etc  He has a foley cath due to a hx of BPH. No hematuria, fever, bladder pain ,etc, noted.   Functional status: lift for transfers, incontinent Past Medical History:  Diagnosis Date  . Abnormality of gait   . Arthritis   . Brain bleed ([place]) 09/01/2016  . Cervical spondylosis   . Controlled type 2 diabetes mellitus without complication, without long-term current use of insulin ([place]) 06/24/2019  . Degenerative joint disease (DJD) of lumbar spine   . Dementia ([place]) 04/28/2019  . Depression   . Diplopia   . Dyslipidemia   . Essential tremor   . Foul smelling urine   . Frequent falls   . GERD (gastroesophageal reflux disease)   . Glycosuria   . Hearing difficulty    hearing aids  . Hyperlipidemia   . Hypertension   . Hyperthyroidism   . Memory loss   . Osteoarthritis   . Radiculopathy, lumbar region   . 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 Small vessel disease ([place])   . TIA (transient ischemic attack)    Past Surgical History:  Procedure Laterality Date  . APPENDECTOMY     done as a child  . GALLBLADDER SURGERY  2008  . KNEE ARTHROSCOPY Left    Dr. Hart Robinsons 2002  . knee injections Right    Dr. Adriana Mccallum  . TONSILLECTOMY     done as a child  No Known Allergies  Outpatient Encounter Medications as of 05/21/2020  Medication Sig  . amLODipine (NORVASC) 10 MG tablet Take 1 tablet (10 mg total) by mouth daily.  Marland Kitchen ammonium lactate (AMLACTIN) 12 % cream Apply 1 Bottle topically at bedtime.   Marland Kitchen aspirin EC 81 MG tablet Take 81 mg by mouth daily.  . carbidopa-levodopa (SINEMET IR) 25-100 MG tablet Take 1.5 tablets by mouth 3 (three) times daily.  . clopidogrel (PLAVIX) 75 MG tablet Take 75 mg by mouth daily.  Marland Kitchen FLAXSEED, LINSEED, PO Take 15 mLs by mouth.  . hydrALAZINE (APRESOLINE) 25 MG tablet Take 25 mg by mouth daily as needed. SBP>160  . MELATONIN-CHAMOMILE PO  Take by mouth at bedtime as needed.  . Memantine HCl-Donepezil HCl (NAMZARIC) 28-10 MG CP24 Take 1 capsule at bedtime by mouth.   . Menthol, Topical Analgesic, (BIOFREEZE) 4 % GEL Apply 1 application topically 2 (two) times daily as needed (pain).   . ondansetron (ZOFRAN) 4 MG tablet Take 4 mg by mouth every 8 (eight) hours as needed for nausea or vomiting.  . pantoprazole (PROTONIX) 40 MG tablet Take 1 tablet (40 mg total) by mouth daily.  . polyethylene glycol (MIRALAX / GLYCOLAX) packet Take 17 g by mouth every other day.  . senna-docusate (SENOKOT-S) 8.6-50 MG tablet Take 2 tablets 2 (two) times daily by mouth.   . sertraline (ZOLOFT) 50 MG tablet Take 50 mg by mouth at bedtime.  Marland Kitchen telmisartan (MICARDIS) 20 MG tablet Take 20 mg by mouth daily.  Marland Kitchen triamcinolone (NASACORT) 55 MCG/ACT AERO nasal inhaler Place 2 sprays into the nose at bedtime.  . carboxymethylcellulose (REFRESH PLUS) 0.5 % SOLN Apply 2 drops to eye as needed (dry eyes).   . Cranberry 400 MG CAPS Take 1 capsule by mouth 3 (three) times daily.  Marland Kitchen ipratropium-albuterol (DUONEB) 0.5-2.5 (3) MG/3ML SOLN Take 3 mLs by nebulization every 8 (eight) hours as needed.  . [DISCONTINUED] gabapentin (NEURONTIN) 100 MG capsule Take 100 mg by mouth 2 (two) times daily.  . [DISCONTINUED] olopatadine (PATADAY) 0.1 % ophthalmic solution Place 1 drop into both eyes 2 (two) times daily.   No facility-administered encounter medications on file as of 05/21/2020.    Review of Systems  Constitutional: Negative for activity change, appetite change, chills, diaphoresis, fatigue, fever and unexpected weight change.  Respiratory: Negative for cough, shortness of breath, wheezing and stridor.   Cardiovascular: Negative for chest pain, palpitations and leg swelling.  Gastrointestinal: Negative for abdominal distention, abdominal pain, constipation and diarrhea.  Genitourinary: Negative for difficulty urinating and dysuria.       Has a catheter    Musculoskeletal: Positive for gait problem. Negative for arthralgias, back pain, joint swelling and myalgias.  Skin: Negative for wound.  Neurological: Negative for dizziness, seizures, syncope, facial asymmetry, speech difficulty, weakness and headaches.  Hematological: Negative for adenopathy. Does not bruise/bleed easily.  Psychiatric/Behavioral: Positive for confusion. Negative for agitation and behavioral problems.    Immunization History  Administered Date(s) Administered  . Influenza Inj Mdck Quad Pf 08/09/2017  . Influenza, High Dose Seasonal PF 07/31/2016, 07/31/2019  . Influenza,inj,Quad PF,6+ Mos 08/06/2018  . Influenza-Unspecified 08/01/2010, 07/30/2012, 10/16/2012, 10/16/2013  . Moderna SARS-COVID-2 Vaccination 11/18/2019, 02/23/2020  . Pneumococcal Polysaccharide-23 02/23/2004, 02/21/2013  . Pneumococcal-Unspecified 03/17/2016  . Td 12/02/2008  . Tdap 09/01/2016  . Zoster 03/28/2006  . Zoster Recombinat (Shingrix) 10/29/2017, 01/24/2018   Pertinent  Health Maintenance Due  Topic Date Due  . INFLUENZA VACCINE  05/16/2020  . HEMOGLOBIN  A1C  05/26/2020  . FOOT EXAM  10/30/2020  . PNA vac Low Risk Adult  Completed  . OPHTHALMOLOGY EXAM  Discontinued   Fall Risk  04/09/2020 09/06/2019 01/04/2019 07/17/2018 08/21/2017  Falls in the past year? 0 1 Exclusion - non ambulatory No No  Number falls in past yr: 0 0 - - -  Injury with Fall? 0 0 - - -  Risk for fall due to : Impaired balance/gait History of fall(s) - - -  Follow up Falls evaluation completed Falls evaluation completed - - -   Functional Status Survey:    Vitals:   05/25/20 0654  Weight: 158 lb 3.2 oz (71.8 kg)   Body mass index is 24.05 kg/m. Physical Exam Vitals and nursing note reviewed.  Constitutional:      General: He is not in acute distress.    Appearance: He is not diaphoretic.  HENT:     Head: Normocephalic and atraumatic.  Neck:     Thyroid: No thyromegaly.     Vascular: No JVD.      Trachea: No tracheal deviation.  Cardiovascular:     Rate and Rhythm: Normal rate and regular rhythm.     Heart sounds: No murmur heard.   Pulmonary:     Effort: Pulmonary effort is normal. No respiratory distress.     Breath sounds: Normal breath sounds. No wheezing.  Abdominal:     General: Bowel sounds are normal. There is no distension.     Palpations: Abdomen is soft.     Tenderness: There is no abdominal tenderness.  Genitourinary:    Comments: Cath with yellow urine and sediment Musculoskeletal:     Right lower leg: No edema.     Left lower leg: No edema.     Comments: Strength RUE 5/5, LUE 4/5. BLE 3/5  Lymphadenopathy:     Cervical: No cervical adenopathy.  Skin:    General: Skin is warm and dry.  Neurological:     Mental Status: He is alert.     Cranial Nerves: No cranial nerve deficit.     Comments: Oriented to self and place, not time. Rigidity noted to BLE.      Labs reviewed: Recent Labs    01/12/20 0000 01/12/20 0600  NA 141  --   K 4.0  --   CL 104  --   CO2 25*  --   BUN 20  --   CREATININE 0.9  --   CALCIUM  --  9.7   No results for input(s): AST, ALT, ALKPHOS, BILITOT, PROT, ALBUMIN in the last 8760 hours. Recent Labs    01/12/20 0000  WBC 9.7  HGB 11.2*  HCT 34*  PLT 331   Lab Results  Component Value Date   TSH 3.14 01/18/2018   Lab Results  Component Value Date   HGBA1C 7.4 11/27/2019   Lab Results  Component Value Date   CHOL 127 05/11/2017   HDL 50 05/11/2017   LDLCALC 58 05/11/2017   TRIG 98 05/11/2017    Significant Diagnostic Results in last 30 days:  No results found.  Assessment/Plan  1. Mixed Alzheimer's and vascular dementia ([place]) Moderate to severe Sleeping more, will d/c neurontin to see if this helps Continue Namzaric  2. Benign prostatic hyperplasia with urinary retention Reason for #3  3. Chronic indwelling Foley catheter No issues with UTI symptoms or blockage Continue cranberry supplements and  maintenance care by [facility]   4. Cerebrovascular disease With associated dementia and  prior TIA Continue Plavix and ASA (per Dr Jannifer Franklin)  5. Essential hypertension Slight above goal 150/90 at times but given his age and dementia would not be aggressive with treatment Continue Norvasc, Micardis, and prn hydralazine   6. Herpes zoster without complication Resolved, completed Valtrex   Family/ staff Communication: discussed with the resident and his nurse Misti  Labs/tests ordered:  NA

## 2020-05-25 ENCOUNTER — Encounter: Payer: Self-pay | Admitting: Adult Health

## 2020-05-31 DIAGNOSIS — Z9189 Other specified personal risk factors, not elsewhere classified: Secondary | ICD-10-CM | POA: Diagnosis not present

## 2020-06-07 DIAGNOSIS — Z9189 Other specified personal risk factors, not elsewhere classified: Secondary | ICD-10-CM | POA: Diagnosis not present

## 2020-06-17 DIAGNOSIS — Z8673 Personal history of transient ischemic attack (TIA), and cerebral infarction without residual deficits: Secondary | ICD-10-CM | POA: Diagnosis not present

## 2020-06-17 DIAGNOSIS — G214 Vascular parkinsonism: Secondary | ICD-10-CM | POA: Diagnosis not present

## 2020-06-17 DIAGNOSIS — G20 Parkinson's disease: Secondary | ICD-10-CM | POA: Diagnosis not present

## 2020-06-17 DIAGNOSIS — R1312 Dysphagia, oropharyngeal phase: Secondary | ICD-10-CM | POA: Diagnosis not present

## 2020-06-17 DIAGNOSIS — M24542 Contracture, left hand: Secondary | ICD-10-CM | POA: Diagnosis not present

## 2020-06-17 DIAGNOSIS — M6389 Disorders of muscle in diseases classified elsewhere, multiple sites: Secondary | ICD-10-CM | POA: Diagnosis not present

## 2020-06-17 DIAGNOSIS — G309 Alzheimer's disease, unspecified: Secondary | ICD-10-CM | POA: Diagnosis not present

## 2020-06-17 DIAGNOSIS — R293 Abnormal posture: Secondary | ICD-10-CM | POA: Diagnosis not present

## 2020-06-22 ENCOUNTER — Other Ambulatory Visit: Payer: Self-pay

## 2020-06-22 ENCOUNTER — Ambulatory Visit: Payer: Medicare Other | Admitting: Neurology

## 2020-06-22 DIAGNOSIS — G214 Vascular parkinsonism: Secondary | ICD-10-CM | POA: Diagnosis not present

## 2020-06-22 DIAGNOSIS — G309 Alzheimer's disease, unspecified: Secondary | ICD-10-CM

## 2020-06-22 DIAGNOSIS — F0280 Dementia in other diseases classified elsewhere without behavioral disturbance: Secondary | ICD-10-CM

## 2020-06-22 DIAGNOSIS — R269 Unspecified abnormalities of gait and mobility: Secondary | ICD-10-CM

## 2020-06-22 DIAGNOSIS — F0150 Vascular dementia without behavioral disturbance: Secondary | ICD-10-CM

## 2020-06-22 NOTE — Progress Notes (Addendum)
PATIENT: Kenneth Stark DOB: 03-Aug-1925  REASON FOR VISIT: follow up HISTORY FROM: patient  HISTORY OF PRESENT ILLNESS: Today 06/22/20 Mr. Crean is a 86 year old male with history of Parkinson's disease, is essentially nonambulatory.  He has a progressive dementing illness, he is on Namzaric and Sinemet.   He lives at [facility], has been more drowsy, gabapentin was d/c in early August. Has indwelling foley cath. He is in SNF, his wife visits him.  At times, his speech may be garbled, he may have difficulty finding his words, but some days he is clear, and he converses with his wife, they watch TV.  He is sleeping well.  He may have occasional delusions, talk [place] or his brother who is deceased.  Has noted more stiffness over time in the legs, arms, and hands.  Is not sure if receiving PT/OT.  He is able to feed himself with the right hand, the left hand is in flexion, hurts to open. His wife wants to maximize quality of life.  Presents today accompanied by Mrs. Bucher, they have been married for 54 years.  HISTORY 04/28/2019 Dr. Jannifer Franklin: Kenneth Stark is a 86 year old right-handed white male with a history of Parkinson's disease, the patient essentially is nonambulatory.  He has developed a progressive dementing illness, he is on Namzeric.  He is having occasional hallucinations according to nursing staff, there is no associated agitation with this.  He has not had any falls, he does not try to get up out of bed on his own.  He recently had some problems with dehydration, over the last week they have reported increased problems with lethargy, they are having trouble getting him to eat and drink because of his sleepiness.  The patient is taking his medications well however.  He has had some progressive problems with memory.   REVIEW OF SYSTEMS: Out of a complete 14 system review of symptoms, the patient complains only of the following symptoms, and all  other reviewed systems are negative.  Memory loss  ALLERGIES: No Known Allergies  HOME MEDICATIONS: Outpatient Medications Prior to Visit  Medication Sig Dispense Refill  . amLODipine (NORVASC) 10 MG tablet Take 1 tablet (10 mg total) by mouth daily. 90 tablet 3  . ammonium lactate (AMLACTIN) 12 % cream Apply 1 Bottle topically at bedtime.     Marland Kitchen aspirin EC 81 MG tablet Take 81 mg by mouth daily.    . carbidopa-levodopa (SINEMET IR) 25-100 MG tablet Take 1.5 tablets by mouth 3 (three) times daily. 135 tablet 3  . carboxymethylcellulose (REFRESH PLUS) 0.5 % SOLN Apply 2 drops to eye as needed (dry eyes).     . clopidogrel (PLAVIX) 75 MG tablet Take 75 mg by mouth daily.    . Cranberry 400 MG CAPS Take 2 capsules by mouth 3 (three) times daily.     Marland Kitchen FLAXSEED, LINSEED, PO Take 15 mLs by mouth.    . hydrALAZINE (APRESOLINE) 25 MG tablet Take 25 mg by mouth daily as needed. SBP>160    . ipratropium-albuterol (DUONEB) 0.5-2.5 (3) MG/3ML SOLN Take 3 mLs by nebulization every 8 (eight) hours as needed.    Marland Kitchen MELATONIN-CHAMOMILE PO Take by mouth at bedtime as needed.    . Memantine HCl-Donepezil HCl (NAMZARIC) 28-10 MG CP24 Take 1 capsule at bedtime by mouth.     . Menthol, Topical Analgesic, (BIOFREEZE) 4 % GEL Apply 1 application topically 2 (two) times daily as needed (pain).     Marland Kitchen  ondansetron (ZOFRAN) 4 MG tablet Take 4 mg by mouth every 8 (eight) hours as needed for nausea or vomiting.    . pantoprazole (PROTONIX) 40 MG tablet Take 1 tablet (40 mg total) by mouth daily. 30 tablet 1  . polyethylene glycol (MIRALAX / GLYCOLAX) packet Take 17 g by mouth every other day.    . senna-docusate (SENOKOT-S) 8.6-50 MG tablet Take 2 tablets 2 (two) times daily by mouth.     . sertraline (ZOLOFT) 50 MG tablet Take 50 mg by mouth at bedtime.    Marland Kitchen telmisartan (MICARDIS) 20 MG tablet Take 20 mg by mouth daily.    Marland Kitchen triamcinolone (NASACORT) 55 MCG/ACT AERO nasal inhaler Place 2 sprays into the nose at  bedtime.     No facility-administered medications prior to visit.    PAST MEDICAL HISTORY: Past Medical History:  Diagnosis Date  . Abnormality of gait   . Arthritis   . Brain bleed ([place]) 09/01/2016  . Cervical spondylosis   . Controlled type 2 diabetes mellitus without complication, without long-term current use of insulin ([place]) 06/24/2019  . Degenerative joint disease (DJD) of lumbar spine   . Dementia ([place]) 04/28/2019  . Depression   . Diplopia   . Dyslipidemia   . Essential tremor   . Foul smelling urine   . Frequent falls   . GERD (gastroesophageal reflux disease)   . Glycosuria   . Hearing difficulty    hearing aids  . Hyperlipidemia   . Hypertension   . Hyperthyroidism   . Memory loss   . Osteoarthritis   . Radiculopathy, lumbar region   . 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 Small vessel disease ([place])   . TIA (transient ischemic attack)     PAST SURGICAL HISTORY: Past Surgical History:  Procedure Laterality Date  . APPENDECTOMY     done as a child  . GALLBLADDER SURGERY  2008  . KNEE ARTHROSCOPY Left    Dr. Hart Robinsons 2002  . knee injections Right    Dr. Adriana Mccallum  . TONSILLECTOMY     done as a child    FAMILY HISTORY: Family History  Problem Relation Age of Onset  . Stroke Mother   . Dementia Mother   . Stroke Father   . Heart disease Father   . Diabetes Father   . Dementia Brother   . Renal Disease Brother   . Diabetes Brother   . Renal Disease Daughter     SOCIAL HISTORY: Social History   Socioeconomic History  . Marital status: Married    Spouse name: [name]  . Number of children: 4  . Years of education: Not on file  . Highest education level: Not on file  Occupational History  . Occupation: Retired    Comment: [employer]  Tobacco Use  . Smoking status: Never Smoker  . Smokeless tobacco: Never Used  Substance and Sexual Activity  . Alcohol use: Yes    Comment: Wife occass/rare  .  Drug use: No  . Sexual activity: Not on file  Other Topics Concern  . Not on file  Social History Narrative   Tobacco use, amount per day now: NEVER   Past tobacco use, amount per day: never    How many years did you use tobacco:   Alcohol use (drinks per week): OCCASIONAL GLASS OF WINE   Diet:HIGH FIBER, LOW FAT   Do you drink/eat things with caffeine:COFFEE  IN THE MORNING   Marital status:    MARRIED                             What year were you married? 1951   Do you live in a house, apartment, assisted living, condo, trailer, etc.? Assisted living    Is it one or more stories?   How many persons live in your home?    Do you have pets in your home?( please list) NO   Highest level of education completed: [name] classes    Current or past profession: [employer].   Do you exercise?    Walking    Type and how often? 3 times a week    Do you have a living will? YES   Do you have a DNR form?  YES                                 If not, do you want to discuss one?   Do you have signed POA/HPOA forms?  YES                      If so, please bring to you appointment      Has difficulty bathing or dressing self   Has difficulty preparing food    Has difficulty managing medications   Has difficulty managing finances   Does not have any difficulty affording medications    Social Determinants of Health   Financial Resource Strain:   . Difficulty of Paying Living Expenses: Not on file  Food Insecurity:   . Worried About Charity fundraiser in the Last Year: Not on file  . Ran Out of Food in the Last Year: Not on file  Transportation Needs:   . Lack of Transportation (Medical): Not on file  . Lack of Transportation (Non-Medical): Not on file  Physical Activity:   . Days of Exercise per Week: Not on file  . Minutes of Exercise per Session: Not on file  Stress:   . Feeling of Stress : Not on file  Social Connections:     . Frequency of Communication with Friends and Family: Not on file  . Frequency of Social Gatherings with Friends and Family: Not on file  . Attends Religious Services: Not on file  . Active Member of Clubs or Organizations: Not on file  . Attends Archivist Meetings: Not on file  . Marital Status: Not on file  Intimate Partner Violence:   . Fear of Current or Ex-Partner: Not on file  . Emotionally Abused: Not on file  . Physically Abused: Not on file  . Sexually Abused: Not on file   PHYSICAL EXAM  There were no vitals filed for this visit. There is no height or weight on file to calculate BMI.  Generalized: Well developed, in no acute distress    MMSE - Mini Mental State Exam 12/01/2017 01/17/2017  Orientation to time 1 4  Orientation to Place 5 5  Registration 2 3  Attention/ Calculation 0 0  Recall 0 0  Language- name 2 objects 2 2  Language- repeat 1 1  Language- follow 3 step command 3 3  Language- read & follow direction 1 0  Write a sentence 0 0  Copy design 0 0  Total score 15 18    Neurological examination  Mentation: Alert, unable to provide DOB or identify his wife on cue. Follows commands well, speech is hypophonic, is drowsy with dropped head, but wakes to command Cranial nerve II-XII: Pupils were equal round reactive to light. Extraocular movements were full, visual field were full on confrontational test. Facial sensation and strength were normal.  Head turning and shoulder shrug were normal and symmetric. Motor: In lower extremities, increased tone, minimally able to elevate legs, upper extremities left has increased tone 3/5, right  4/5 strength is greater than left, left hand is in flexion Sensory: Sensory testing is intact to soft touch on all 4 extremities. No evidence of extinction is noted.  Coordination: Attempted to perform finger-nose-finger with the right, does well, difficulty with the left Gait and station: Is nonambulatory Reflexes: Deep  tendon reflexes are symmetric   DIAGNOSTIC DATA (LABS, IMAGING, TESTING) - I reviewed patient records, labs, notes, testing and imaging myself where available.  Lab Results  Component Value Date   WBC 9.7 01/12/2020   HGB 11.2 (A) 01/12/2020   HCT 34 (A) 01/12/2020   MCV 93.0 11/29/2017   PLT 331 01/12/2020      Component Value Date/Time   NA 141 01/12/2020 0000   K 4.0 01/12/2020 0000   CL 104 01/12/2020 0000   CO2 25 (A) 01/12/2020 0000   GLUCOSE 77 11/29/2017 0608   BUN 20 01/12/2020 0000   CREATININE 0.9 01/12/2020 0000   CREATININE 0.85 11/29/2017 0608   CALCIUM 9.7 01/12/2020 0600   PROT 6.3 (L) 11/28/2017 0026   ALBUMIN 3.1 (L) 11/28/2017 0026   AST 25 11/28/2017 0026   ALT 9 (L) 11/28/2017 0026   ALKPHOS 61 11/28/2017 0026   BILITOT 0.7 11/28/2017 0026   GFRNONAA >60 11/29/2017 0608   GFRAA >60 11/29/2017 0608   Lab Results  Component Value Date   CHOL 127 05/11/2017   HDL 50 05/11/2017   LDLCALC 58 05/11/2017   TRIG 98 05/11/2017   Lab Results  Component Value Date   HGBA1C 7.4 11/27/2019   Lab Results  Component Value Date   POEUMPNT61 443 12/08/2016   Lab Results  Component Value Date   TSH 3.14 01/18/2018      ASSESSMENT AND PLAN 86 y.o. year old male  has a past medical history of Abnormality of gait, Arthritis, Brain bleed ([place]) (09/01/2016), Cervical spondylosis, Controlled type 2 diabetes mellitus without complication, without long-term current use of insulin ([place]) (06/24/2019), Degenerative joint disease (DJD) of lumbar spine, Dementia ([place]) (04/28/2019), Depression, Diplopia, Dyslipidemia, Essential tremor, Foul smelling urine, Frequent falls, GERD (gastroesophageal reflux disease), Glycosuria, Hearing difficulty, Hyperlipidemia, Hypertension, Hyperthyroidism, Memory loss, Osteoarthritis, Radiculopathy, lumbar region, Sixth nerve palsy, Small vessel disease ([place]), and TIA (transient ischemic attack). here with:  1.  Progressive dementia 2.   Parkinson's disease 3.  Gait disorder 4.  Lethargy  -The dosing for Namzaric and Sinemet will continue  -He is more drowsy, sleeping more, possibly dementia progressing, consider underlying sleep disorder? but hesitant to put him through work-up at this point  -His mood is good, he is cooperative  -Gabapentin has been discontinued  -Noted to have more stiffness to extremities, increasing Sinemet would likely have more adverse effect, he is nonambulatory  -May consider PT/OT, will reach out to primary NP to see if she feels this may benefit him, I worry it may cause him discomfort (Addendum 06/29/2020: Will send orders to facility for PT/OT to screen and  treat)  -His wife and I talked a lot about maximizing his quality of life, keeping him comfortable  -Follow-up in 6 months, he may decide to cancel further follow-ups at our office   I spent 30 minutes of face-to-face and non-face-to-face time with patient.  This included previsit chart review, lab review, study review, order entry, electronic health record documentation, patient education.  Butler Denmark, AGNP-C, DNP 06/22/2020, 5:04 PM [facility] [address], [place], [place] [address] [phone]

## 2020-06-23 ENCOUNTER — Encounter: Payer: Self-pay | Admitting: Neurology

## 2020-06-24 DIAGNOSIS — G20 Parkinson's disease: Secondary | ICD-10-CM | POA: Diagnosis not present

## 2020-06-24 DIAGNOSIS — R293 Abnormal posture: Secondary | ICD-10-CM | POA: Diagnosis not present

## 2020-06-24 DIAGNOSIS — M24542 Contracture, left hand: Secondary | ICD-10-CM | POA: Diagnosis not present

## 2020-06-24 DIAGNOSIS — R1312 Dysphagia, oropharyngeal phase: Secondary | ICD-10-CM | POA: Diagnosis not present

## 2020-06-24 DIAGNOSIS — G309 Alzheimer's disease, unspecified: Secondary | ICD-10-CM | POA: Diagnosis not present

## 2020-06-24 DIAGNOSIS — G214 Vascular parkinsonism: Secondary | ICD-10-CM | POA: Diagnosis not present

## 2020-06-24 DIAGNOSIS — Z8673 Personal history of transient ischemic attack (TIA), and cerebral infarction without residual deficits: Secondary | ICD-10-CM | POA: Diagnosis not present

## 2020-06-24 DIAGNOSIS — M6389 Disorders of muscle in diseases classified elsewhere, multiple sites: Secondary | ICD-10-CM | POA: Diagnosis not present

## 2020-06-24 NOTE — Progress Notes (Signed)
I have read the note, and I agree with the clinical assessment and plan.  Romy Mcgue K Audianna Landgren   

## 2020-06-25 DIAGNOSIS — G20 Parkinson's disease: Secondary | ICD-10-CM | POA: Diagnosis not present

## 2020-06-25 DIAGNOSIS — M6389 Disorders of muscle in diseases classified elsewhere, multiple sites: Secondary | ICD-10-CM | POA: Diagnosis not present

## 2020-06-25 DIAGNOSIS — G214 Vascular parkinsonism: Secondary | ICD-10-CM | POA: Diagnosis not present

## 2020-06-25 DIAGNOSIS — M24542 Contracture, left hand: Secondary | ICD-10-CM | POA: Diagnosis not present

## 2020-06-25 DIAGNOSIS — G309 Alzheimer's disease, unspecified: Secondary | ICD-10-CM | POA: Diagnosis not present

## 2020-06-25 DIAGNOSIS — R1312 Dysphagia, oropharyngeal phase: Secondary | ICD-10-CM | POA: Diagnosis not present

## 2020-06-25 DIAGNOSIS — R293 Abnormal posture: Secondary | ICD-10-CM | POA: Diagnosis not present

## 2020-06-25 DIAGNOSIS — Z8673 Personal history of transient ischemic attack (TIA), and cerebral infarction without residual deficits: Secondary | ICD-10-CM | POA: Diagnosis not present

## 2020-06-28 DIAGNOSIS — G309 Alzheimer's disease, unspecified: Secondary | ICD-10-CM | POA: Diagnosis not present

## 2020-06-28 DIAGNOSIS — M6389 Disorders of muscle in diseases classified elsewhere, multiple sites: Secondary | ICD-10-CM | POA: Diagnosis not present

## 2020-06-28 DIAGNOSIS — R293 Abnormal posture: Secondary | ICD-10-CM | POA: Diagnosis not present

## 2020-06-28 DIAGNOSIS — R1312 Dysphagia, oropharyngeal phase: Secondary | ICD-10-CM | POA: Diagnosis not present

## 2020-06-28 DIAGNOSIS — G20 Parkinson's disease: Secondary | ICD-10-CM | POA: Diagnosis not present

## 2020-06-28 DIAGNOSIS — Z8673 Personal history of transient ischemic attack (TIA), and cerebral infarction without residual deficits: Secondary | ICD-10-CM | POA: Diagnosis not present

## 2020-06-28 DIAGNOSIS — M24542 Contracture, left hand: Secondary | ICD-10-CM | POA: Diagnosis not present

## 2020-06-28 DIAGNOSIS — G214 Vascular parkinsonism: Secondary | ICD-10-CM | POA: Diagnosis not present

## 2020-06-29 DIAGNOSIS — G309 Alzheimer's disease, unspecified: Secondary | ICD-10-CM | POA: Diagnosis not present

## 2020-06-29 DIAGNOSIS — M6389 Disorders of muscle in diseases classified elsewhere, multiple sites: Secondary | ICD-10-CM | POA: Diagnosis not present

## 2020-06-29 DIAGNOSIS — M24542 Contracture, left hand: Secondary | ICD-10-CM | POA: Diagnosis not present

## 2020-06-29 DIAGNOSIS — G214 Vascular parkinsonism: Secondary | ICD-10-CM | POA: Diagnosis not present

## 2020-06-29 DIAGNOSIS — Z8673 Personal history of transient ischemic attack (TIA), and cerebral infarction without residual deficits: Secondary | ICD-10-CM | POA: Diagnosis not present

## 2020-06-29 DIAGNOSIS — R293 Abnormal posture: Secondary | ICD-10-CM | POA: Diagnosis not present

## 2020-06-29 DIAGNOSIS — G20 Parkinson's disease: Secondary | ICD-10-CM | POA: Diagnosis not present

## 2020-06-29 DIAGNOSIS — R1312 Dysphagia, oropharyngeal phase: Secondary | ICD-10-CM | POA: Diagnosis not present

## 2020-06-30 DIAGNOSIS — G309 Alzheimer's disease, unspecified: Secondary | ICD-10-CM | POA: Diagnosis not present

## 2020-06-30 DIAGNOSIS — Z8673 Personal history of transient ischemic attack (TIA), and cerebral infarction without residual deficits: Secondary | ICD-10-CM | POA: Diagnosis not present

## 2020-06-30 DIAGNOSIS — M6389 Disorders of muscle in diseases classified elsewhere, multiple sites: Secondary | ICD-10-CM | POA: Diagnosis not present

## 2020-06-30 DIAGNOSIS — M24542 Contracture, left hand: Secondary | ICD-10-CM | POA: Diagnosis not present

## 2020-06-30 DIAGNOSIS — G214 Vascular parkinsonism: Secondary | ICD-10-CM | POA: Diagnosis not present

## 2020-06-30 DIAGNOSIS — R293 Abnormal posture: Secondary | ICD-10-CM | POA: Diagnosis not present

## 2020-06-30 DIAGNOSIS — G20 Parkinson's disease: Secondary | ICD-10-CM | POA: Diagnosis not present

## 2020-06-30 DIAGNOSIS — R1312 Dysphagia, oropharyngeal phase: Secondary | ICD-10-CM | POA: Diagnosis not present

## 2020-07-01 DIAGNOSIS — R1312 Dysphagia, oropharyngeal phase: Secondary | ICD-10-CM | POA: Diagnosis not present

## 2020-07-01 DIAGNOSIS — R293 Abnormal posture: Secondary | ICD-10-CM | POA: Diagnosis not present

## 2020-07-01 DIAGNOSIS — M24542 Contracture, left hand: Secondary | ICD-10-CM | POA: Diagnosis not present

## 2020-07-01 DIAGNOSIS — Z8673 Personal history of transient ischemic attack (TIA), and cerebral infarction without residual deficits: Secondary | ICD-10-CM | POA: Diagnosis not present

## 2020-07-01 DIAGNOSIS — M6389 Disorders of muscle in diseases classified elsewhere, multiple sites: Secondary | ICD-10-CM | POA: Diagnosis not present

## 2020-07-01 DIAGNOSIS — G214 Vascular parkinsonism: Secondary | ICD-10-CM | POA: Diagnosis not present

## 2020-07-01 DIAGNOSIS — G20 Parkinson's disease: Secondary | ICD-10-CM | POA: Diagnosis not present

## 2020-07-01 DIAGNOSIS — G309 Alzheimer's disease, unspecified: Secondary | ICD-10-CM | POA: Diagnosis not present

## 2020-07-02 ENCOUNTER — Encounter: Payer: Self-pay | Admitting: Adult Health

## 2020-07-02 ENCOUNTER — Non-Acute Institutional Stay (SKILLED_NURSING_FACILITY): Payer: Medicare Other | Admitting: Adult Health

## 2020-07-02 DIAGNOSIS — R1312 Dysphagia, oropharyngeal phase: Secondary | ICD-10-CM | POA: Diagnosis not present

## 2020-07-02 DIAGNOSIS — M6389 Disorders of muscle in diseases classified elsewhere, multiple sites: Secondary | ICD-10-CM | POA: Diagnosis not present

## 2020-07-02 DIAGNOSIS — R293 Abnormal posture: Secondary | ICD-10-CM | POA: Diagnosis not present

## 2020-07-02 DIAGNOSIS — T17908A Unspecified foreign body in respiratory tract, part unspecified causing other injury, initial encounter: Secondary | ICD-10-CM | POA: Diagnosis not present

## 2020-07-02 DIAGNOSIS — G20 Parkinson's disease: Secondary | ICD-10-CM | POA: Diagnosis not present

## 2020-07-02 DIAGNOSIS — R5383 Other fatigue: Secondary | ICD-10-CM

## 2020-07-02 DIAGNOSIS — G214 Vascular parkinsonism: Secondary | ICD-10-CM | POA: Diagnosis not present

## 2020-07-02 DIAGNOSIS — M24542 Contracture, left hand: Secondary | ICD-10-CM | POA: Diagnosis not present

## 2020-07-02 DIAGNOSIS — Z8673 Personal history of transient ischemic attack (TIA), and cerebral infarction without residual deficits: Secondary | ICD-10-CM | POA: Diagnosis not present

## 2020-07-02 DIAGNOSIS — G309 Alzheimer's disease, unspecified: Secondary | ICD-10-CM | POA: Diagnosis not present

## 2020-07-02 NOTE — Progress Notes (Signed)
Location:  Occupational psychologist of Service:  SNF (31) Provider:   Cindi Carbon, ANP [place] [phone]   [name], DO  Patient Care Team: [name], DO as PCP - General (Geriatric Medicine) Danella Sensing, MD as Consulting Physician (Dermatology) Sharyne Peach, MD as Consulting Physician (Ophthalmology) Irine Seal, MD as Attending Physician (Urology) [name], MD as Consulting Physician (Neurology)  Extended Emergency Contact Information Primary Emergency Contact: Caravello,[name] Address: [address]           [address] [name] of [place] Phone: [phone] Relation: Spouse Secondary Emergency Contact: [name] [place] of [place] Mobile Phone: [phone] Relation: Daughter  Code Status:  DNR Goals of care: Advanced Directive information Advanced Directives 07/02/2020  Does Patient Have a Medical Advance Directive? Yes  Type of Paramedic of [place];Living will;Out of facility DNR (pink MOST or yellow form)  Does patient want to make changes to medical advance directive? -  Copy of [place] in Chart? Yes - validated most recent copy scanned in chart (See row information)  Would patient like information on creating a medical advance directive? -  Pre-existing out of facility DNR order (yellow form or pink MOST form) Yellow form placed in chart (order not valid for inpatient use);Pink MOST form placed in chart (order not valid for inpatient use)     Chief Complaint  Patient presents with  . Acute Visit    cough, lethargy    HPI:  Pt is a 86 y.o. male seen today for an acute visit for cough and lethargy. Kenneth Stark has a hx of vascular dementia. His nurse reports he is coughing and having lethargy for the past two days. There was an event where he vomited at dinner and the nurse thought he may have aspirated. He is also sleeping  more. Sats are WNL. No sputum production or fever. No sob or cp. He is currently on D3 diet. He is vaccinated for Covid with Moderna and had Covid 8 months ago.  His last BM was 9/16.  There are no instances of abd pain, constipation, or further n/v.   Past Medical History:  Diagnosis Date  . Abnormality of gait   . Arthritis   . Brain bleed ([place]) 09/01/2016  . Cervical spondylosis   . Controlled type 2 diabetes mellitus without complication, without long-term current use of insulin ([place]) 06/24/2019  . Degenerative joint disease (DJD) of lumbar spine   . Dementia ([place]) 04/28/2019  . Depression   . Diplopia   . Dyslipidemia   . Essential tremor   . Foul smelling urine   . Frequent falls   . GERD (gastroesophageal reflux disease)   . Glycosuria   . Hearing difficulty    hearing aids  . Hyperlipidemia   . Hypertension   . Hyperthyroidism   . Memory loss   . Osteoarthritis   . Radiculopathy, lumbar region   . 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 Small vessel disease ([place])   . TIA (transient ischemic attack)    Past Surgical History:  Procedure Laterality Date  . APPENDECTOMY     done as a child  . GALLBLADDER SURGERY  2008  . KNEE ARTHROSCOPY Left    Dr. Hart Robinsons 2002  . knee injections Right    Dr. Adriana Mccallum  . TONSILLECTOMY     done as a  child    No Known Allergies  Outpatient Encounter Medications as of 07/02/2020  Medication Sig  . amLODipine (NORVASC) 10 MG tablet Take 1 tablet (10 mg total) by mouth daily.  Marland Kitchen ammonium lactate (AMLACTIN) 12 % cream Apply 1 Bottle topically at bedtime.   Marland Kitchen aspirin EC 81 MG tablet Take 81 mg by mouth daily.  . carbidopa-levodopa (SINEMET IR) 25-100 MG tablet Take 1.5 tablets by mouth 3 (three) times daily.  . carboxymethylcellulose (REFRESH PLUS) 0.5 % SOLN Apply 2 drops to eye as needed (dry eyes).   . clopidogrel (PLAVIX) 75 MG tablet Take 75 mg by mouth daily.  . Cranberry 400 MG CAPS Take 2  capsules by mouth 3 (three) times daily.   Marland Kitchen FLAXSEED, LINSEED, PO Take 15 mLs by mouth.  . hydrALAZINE (APRESOLINE) 25 MG tablet Take 25 mg by mouth daily as needed. SBP>160  . ipratropium-albuterol (DUONEB) 0.5-2.5 (3) MG/3ML SOLN Take 3 mLs by nebulization every 8 (eight) hours as needed.  Marland Kitchen MELATONIN-CHAMOMILE PO Take by mouth at bedtime as needed.  . Memantine HCl-Donepezil HCl (NAMZARIC) 28-10 MG CP24 Take 1 capsule at bedtime by mouth.   . Menthol, Topical Analgesic, (BIOFREEZE) 4 % GEL Apply 1 application topically 2 (two) times daily as needed (pain).   . ondansetron (ZOFRAN) 4 MG tablet Take 4 mg by mouth every 8 (eight) hours as needed for nausea or vomiting.  . pantoprazole (PROTONIX) 40 MG tablet Take 1 tablet (40 mg total) by mouth daily.  . polyethylene glycol (MIRALAX / GLYCOLAX) packet Take 17 g by mouth every other day.  . senna-docusate (SENOKOT-S) 8.6-50 MG tablet Take 2 tablets 2 (two) times daily by mouth.   . sertraline (ZOLOFT) 50 MG tablet Take 50 mg by mouth at bedtime.  Marland Kitchen telmisartan (MICARDIS) 20 MG tablet Take 20 mg by mouth daily.  Marland Kitchen triamcinolone (NASACORT) 55 MCG/ACT AERO nasal inhaler Place 2 sprays into the nose at bedtime.   No facility-administered encounter medications on file as of 07/02/2020.    Review of Systems  Unable to perform ROS: Dementia    Immunization History  Administered Date(s) Administered  . Influenza Inj Mdck Quad Pf 08/09/2017  . Influenza, High Dose Seasonal PF 07/31/2016, 07/31/2019  . Influenza,inj,Quad PF,6+ Mos 08/06/2018  . Influenza-Unspecified 08/01/2010, 07/30/2012, 10/16/2012, 10/16/2013  . Moderna SARS-COVID-2 Vaccination 11/18/2019, 02/23/2020  . Pneumococcal Polysaccharide-23 02/23/2004, 02/21/2013  . Pneumococcal-Unspecified 03/17/2016  . Td 12/02/2008  . Tdap 09/01/2016  . Zoster 03/28/2006  . Zoster Recombinat (Shingrix) 10/29/2017, 01/24/2018   Pertinent  Health Maintenance Due  Topic Date Due  . INFLUENZA  VACCINE  05/16/2020  . HEMOGLOBIN A1C  05/26/2020  . FOOT EXAM  10/30/2020  . PNA vac Low Risk Adult  Completed  . OPHTHALMOLOGY EXAM  Discontinued   Fall Risk  04/09/2020 09/06/2019 01/04/2019 07/17/2018 08/21/2017  Falls in the past year? 0 1 Exclusion - non ambulatory No No  Number falls in past yr: 0 0 - - -  Injury with Fall? 0 0 - - -  Risk for fall due to : Impaired balance/gait History of fall(s) - - -  Follow up Falls evaluation completed Falls evaluation completed - - -   Functional Status Survey:    Vitals:   07/02/20 1358  BP: 139/74  Pulse: 70  Resp: 14  Temp: 98.4 F (36.9 C)  SpO2: 97%   There is no height or weight on file to calculate BMI. Physical Exam Vitals and nursing note reviewed.  Constitutional:      General: He is not in acute distress.    Comments: Lethargic  HENT:     Head: Normocephalic and atraumatic.  Cardiovascular:     Rate and Rhythm: Normal rate and regular rhythm.  Pulmonary:     Breath sounds: Wheezing and rhonchi present.  Abdominal:     General: Bowel sounds are normal. There is no distension.     Palpations: Abdomen is soft.     Tenderness: There is no abdominal tenderness.  Skin:    General: Skin is warm and dry.  Neurological:     Comments: Lethargic, not able to f/c. Responds to verbal stim but falls back asleep     Labs reviewed: Recent Labs    01/12/20 0000 01/12/20 0600  NA 141  --   K 4.0  --   CL 104  --   CO2 25*  --   BUN 20  --   CREATININE 0.9  --   CALCIUM  --  9.7   No results for input(s): AST, ALT, ALKPHOS, BILITOT, PROT, ALBUMIN in the last 8760 hours. Recent Labs    01/12/20 0000  WBC 9.7  HGB 11.2*  HCT 34*  PLT 331   Lab Results  Component Value Date   TSH 3.14 01/18/2018   Lab Results  Component Value Date   HGBA1C 7.4 11/27/2019   Lab Results  Component Value Date   CHOL 127 05/11/2017   HDL 50 05/11/2017   LDLCALC 58 05/11/2017   TRIG 98 05/11/2017    Significant Diagnostic  Results in last 30 days:  No results found.  Assessment/Plan 1. Aspiration into respiratory tract, initial encounter Check CXR to rule out pna Continue Duoneb q 8 prn cough, sob May need speech therapy eval Avoid hospitalizations DNR No feeding tubes  2. Lethargy Will discontinue skelaxin. His lethargy could be due to this or underlying pna.     Family/ staff Communication: nurse  Labs/tests ordered:  CXR

## 2020-07-05 DIAGNOSIS — M6389 Disorders of muscle in diseases classified elsewhere, multiple sites: Secondary | ICD-10-CM | POA: Diagnosis not present

## 2020-07-05 DIAGNOSIS — Z8673 Personal history of transient ischemic attack (TIA), and cerebral infarction without residual deficits: Secondary | ICD-10-CM | POA: Diagnosis not present

## 2020-07-05 DIAGNOSIS — G20 Parkinson's disease: Secondary | ICD-10-CM | POA: Diagnosis not present

## 2020-07-05 DIAGNOSIS — G214 Vascular parkinsonism: Secondary | ICD-10-CM | POA: Diagnosis not present

## 2020-07-05 DIAGNOSIS — R1312 Dysphagia, oropharyngeal phase: Secondary | ICD-10-CM | POA: Diagnosis not present

## 2020-07-05 DIAGNOSIS — G309 Alzheimer's disease, unspecified: Secondary | ICD-10-CM | POA: Diagnosis not present

## 2020-07-05 DIAGNOSIS — R293 Abnormal posture: Secondary | ICD-10-CM | POA: Diagnosis not present

## 2020-07-05 DIAGNOSIS — M24542 Contracture, left hand: Secondary | ICD-10-CM | POA: Diagnosis not present

## 2020-07-06 DIAGNOSIS — G214 Vascular parkinsonism: Secondary | ICD-10-CM | POA: Diagnosis not present

## 2020-07-06 DIAGNOSIS — G309 Alzheimer's disease, unspecified: Secondary | ICD-10-CM | POA: Diagnosis not present

## 2020-07-06 DIAGNOSIS — R293 Abnormal posture: Secondary | ICD-10-CM | POA: Diagnosis not present

## 2020-07-06 DIAGNOSIS — M24542 Contracture, left hand: Secondary | ICD-10-CM | POA: Diagnosis not present

## 2020-07-06 DIAGNOSIS — G20 Parkinson's disease: Secondary | ICD-10-CM | POA: Diagnosis not present

## 2020-07-06 DIAGNOSIS — R1312 Dysphagia, oropharyngeal phase: Secondary | ICD-10-CM | POA: Diagnosis not present

## 2020-07-06 DIAGNOSIS — M6389 Disorders of muscle in diseases classified elsewhere, multiple sites: Secondary | ICD-10-CM | POA: Diagnosis not present

## 2020-07-06 DIAGNOSIS — Z8673 Personal history of transient ischemic attack (TIA), and cerebral infarction without residual deficits: Secondary | ICD-10-CM | POA: Diagnosis not present

## 2020-07-07 DIAGNOSIS — M24542 Contracture, left hand: Secondary | ICD-10-CM | POA: Diagnosis not present

## 2020-07-07 DIAGNOSIS — M6389 Disorders of muscle in diseases classified elsewhere, multiple sites: Secondary | ICD-10-CM | POA: Diagnosis not present

## 2020-07-07 DIAGNOSIS — R1312 Dysphagia, oropharyngeal phase: Secondary | ICD-10-CM | POA: Diagnosis not present

## 2020-07-07 DIAGNOSIS — R293 Abnormal posture: Secondary | ICD-10-CM | POA: Diagnosis not present

## 2020-07-07 DIAGNOSIS — G214 Vascular parkinsonism: Secondary | ICD-10-CM | POA: Diagnosis not present

## 2020-07-07 DIAGNOSIS — G20 Parkinson's disease: Secondary | ICD-10-CM | POA: Diagnosis not present

## 2020-07-07 DIAGNOSIS — G309 Alzheimer's disease, unspecified: Secondary | ICD-10-CM | POA: Diagnosis not present

## 2020-07-07 DIAGNOSIS — Z8673 Personal history of transient ischemic attack (TIA), and cerebral infarction without residual deficits: Secondary | ICD-10-CM | POA: Diagnosis not present

## 2020-07-08 DIAGNOSIS — R293 Abnormal posture: Secondary | ICD-10-CM | POA: Diagnosis not present

## 2020-07-08 DIAGNOSIS — R1312 Dysphagia, oropharyngeal phase: Secondary | ICD-10-CM | POA: Diagnosis not present

## 2020-07-08 DIAGNOSIS — G20 Parkinson's disease: Secondary | ICD-10-CM | POA: Diagnosis not present

## 2020-07-08 DIAGNOSIS — M6389 Disorders of muscle in diseases classified elsewhere, multiple sites: Secondary | ICD-10-CM | POA: Diagnosis not present

## 2020-07-08 DIAGNOSIS — G309 Alzheimer's disease, unspecified: Secondary | ICD-10-CM | POA: Diagnosis not present

## 2020-07-08 DIAGNOSIS — G214 Vascular parkinsonism: Secondary | ICD-10-CM | POA: Diagnosis not present

## 2020-07-08 DIAGNOSIS — M24542 Contracture, left hand: Secondary | ICD-10-CM | POA: Diagnosis not present

## 2020-07-08 DIAGNOSIS — Z8673 Personal history of transient ischemic attack (TIA), and cerebral infarction without residual deficits: Secondary | ICD-10-CM | POA: Diagnosis not present

## 2020-07-09 DIAGNOSIS — R293 Abnormal posture: Secondary | ICD-10-CM | POA: Diagnosis not present

## 2020-07-09 DIAGNOSIS — G309 Alzheimer's disease, unspecified: Secondary | ICD-10-CM | POA: Diagnosis not present

## 2020-07-09 DIAGNOSIS — R1312 Dysphagia, oropharyngeal phase: Secondary | ICD-10-CM | POA: Diagnosis not present

## 2020-07-09 DIAGNOSIS — M6389 Disorders of muscle in diseases classified elsewhere, multiple sites: Secondary | ICD-10-CM | POA: Diagnosis not present

## 2020-07-09 DIAGNOSIS — G214 Vascular parkinsonism: Secondary | ICD-10-CM | POA: Diagnosis not present

## 2020-07-09 DIAGNOSIS — M24542 Contracture, left hand: Secondary | ICD-10-CM | POA: Diagnosis not present

## 2020-07-09 DIAGNOSIS — G20 Parkinson's disease: Secondary | ICD-10-CM | POA: Diagnosis not present

## 2020-07-09 DIAGNOSIS — Z8673 Personal history of transient ischemic attack (TIA), and cerebral infarction without residual deficits: Secondary | ICD-10-CM | POA: Diagnosis not present

## 2020-07-13 DIAGNOSIS — R293 Abnormal posture: Secondary | ICD-10-CM | POA: Diagnosis not present

## 2020-07-13 DIAGNOSIS — G214 Vascular parkinsonism: Secondary | ICD-10-CM | POA: Diagnosis not present

## 2020-07-13 DIAGNOSIS — R1312 Dysphagia, oropharyngeal phase: Secondary | ICD-10-CM | POA: Diagnosis not present

## 2020-07-13 DIAGNOSIS — G20 Parkinson's disease: Secondary | ICD-10-CM | POA: Diagnosis not present

## 2020-07-13 DIAGNOSIS — Z8673 Personal history of transient ischemic attack (TIA), and cerebral infarction without residual deficits: Secondary | ICD-10-CM | POA: Diagnosis not present

## 2020-07-13 DIAGNOSIS — M24542 Contracture, left hand: Secondary | ICD-10-CM | POA: Diagnosis not present

## 2020-07-13 DIAGNOSIS — G309 Alzheimer's disease, unspecified: Secondary | ICD-10-CM | POA: Diagnosis not present

## 2020-07-13 DIAGNOSIS — M6389 Disorders of muscle in diseases classified elsewhere, multiple sites: Secondary | ICD-10-CM | POA: Diagnosis not present

## 2020-07-14 DIAGNOSIS — Z8673 Personal history of transient ischemic attack (TIA), and cerebral infarction without residual deficits: Secondary | ICD-10-CM | POA: Diagnosis not present

## 2020-07-14 DIAGNOSIS — R293 Abnormal posture: Secondary | ICD-10-CM | POA: Diagnosis not present

## 2020-07-14 DIAGNOSIS — G20 Parkinson's disease: Secondary | ICD-10-CM | POA: Diagnosis not present

## 2020-07-14 DIAGNOSIS — G309 Alzheimer's disease, unspecified: Secondary | ICD-10-CM | POA: Diagnosis not present

## 2020-07-14 DIAGNOSIS — M24542 Contracture, left hand: Secondary | ICD-10-CM | POA: Diagnosis not present

## 2020-07-14 DIAGNOSIS — G214 Vascular parkinsonism: Secondary | ICD-10-CM | POA: Diagnosis not present

## 2020-07-14 DIAGNOSIS — M6389 Disorders of muscle in diseases classified elsewhere, multiple sites: Secondary | ICD-10-CM | POA: Diagnosis not present

## 2020-07-14 DIAGNOSIS — R1312 Dysphagia, oropharyngeal phase: Secondary | ICD-10-CM | POA: Diagnosis not present

## 2020-07-15 DIAGNOSIS — Z8673 Personal history of transient ischemic attack (TIA), and cerebral infarction without residual deficits: Secondary | ICD-10-CM | POA: Diagnosis not present

## 2020-07-15 DIAGNOSIS — G214 Vascular parkinsonism: Secondary | ICD-10-CM | POA: Diagnosis not present

## 2020-07-15 DIAGNOSIS — M24542 Contracture, left hand: Secondary | ICD-10-CM | POA: Diagnosis not present

## 2020-07-15 DIAGNOSIS — M6389 Disorders of muscle in diseases classified elsewhere, multiple sites: Secondary | ICD-10-CM | POA: Diagnosis not present

## 2020-07-15 DIAGNOSIS — G309 Alzheimer's disease, unspecified: Secondary | ICD-10-CM | POA: Diagnosis not present

## 2020-07-15 DIAGNOSIS — R1312 Dysphagia, oropharyngeal phase: Secondary | ICD-10-CM | POA: Diagnosis not present

## 2020-07-15 DIAGNOSIS — G20 Parkinson's disease: Secondary | ICD-10-CM | POA: Diagnosis not present

## 2020-07-15 DIAGNOSIS — R293 Abnormal posture: Secondary | ICD-10-CM | POA: Diagnosis not present

## 2020-07-16 DIAGNOSIS — M24542 Contracture, left hand: Secondary | ICD-10-CM | POA: Diagnosis not present

## 2020-07-16 DIAGNOSIS — M6389 Disorders of muscle in diseases classified elsewhere, multiple sites: Secondary | ICD-10-CM | POA: Diagnosis not present

## 2020-07-16 DIAGNOSIS — R1312 Dysphagia, oropharyngeal phase: Secondary | ICD-10-CM | POA: Diagnosis not present

## 2020-07-16 DIAGNOSIS — Z8673 Personal history of transient ischemic attack (TIA), and cerebral infarction without residual deficits: Secondary | ICD-10-CM | POA: Diagnosis not present

## 2020-07-16 DIAGNOSIS — R293 Abnormal posture: Secondary | ICD-10-CM | POA: Diagnosis not present

## 2020-07-16 DIAGNOSIS — G20 Parkinson's disease: Secondary | ICD-10-CM | POA: Diagnosis not present

## 2020-07-16 DIAGNOSIS — G308 Other Alzheimer's disease: Secondary | ICD-10-CM | POA: Diagnosis not present

## 2020-07-16 DIAGNOSIS — G214 Vascular parkinsonism: Secondary | ICD-10-CM | POA: Diagnosis not present

## 2020-07-19 ENCOUNTER — Non-Acute Institutional Stay (SKILLED_NURSING_FACILITY): Payer: Medicare Other | Admitting: Adult Health

## 2020-07-19 DIAGNOSIS — G214 Vascular parkinsonism: Secondary | ICD-10-CM

## 2020-07-19 DIAGNOSIS — G308 Other Alzheimer's disease: Secondary | ICD-10-CM | POA: Diagnosis not present

## 2020-07-19 DIAGNOSIS — M6389 Disorders of muscle in diseases classified elsewhere, multiple sites: Secondary | ICD-10-CM | POA: Diagnosis not present

## 2020-07-19 DIAGNOSIS — G309 Alzheimer's disease, unspecified: Secondary | ICD-10-CM | POA: Diagnosis not present

## 2020-07-19 DIAGNOSIS — L89890 Pressure ulcer of other site, unstageable: Secondary | ICD-10-CM | POA: Diagnosis not present

## 2020-07-19 DIAGNOSIS — R293 Abnormal posture: Secondary | ICD-10-CM | POA: Diagnosis not present

## 2020-07-19 DIAGNOSIS — K5901 Slow transit constipation: Secondary | ICD-10-CM

## 2020-07-19 DIAGNOSIS — M24549 Contracture, unspecified hand: Secondary | ICD-10-CM

## 2020-07-19 DIAGNOSIS — F0150 Vascular dementia without behavioral disturbance: Secondary | ICD-10-CM

## 2020-07-19 DIAGNOSIS — G20 Parkinson's disease: Secondary | ICD-10-CM | POA: Diagnosis not present

## 2020-07-19 DIAGNOSIS — M24542 Contracture, left hand: Secondary | ICD-10-CM | POA: Diagnosis not present

## 2020-07-19 DIAGNOSIS — I10 Essential (primary) hypertension: Secondary | ICD-10-CM

## 2020-07-19 DIAGNOSIS — R1312 Dysphagia, oropharyngeal phase: Secondary | ICD-10-CM

## 2020-07-19 DIAGNOSIS — E119 Type 2 diabetes mellitus without complications: Secondary | ICD-10-CM

## 2020-07-19 DIAGNOSIS — F0280 Dementia in other diseases classified elsewhere without behavioral disturbance: Secondary | ICD-10-CM

## 2020-07-19 DIAGNOSIS — Z8673 Personal history of transient ischemic attack (TIA), and cerebral infarction without residual deficits: Secondary | ICD-10-CM | POA: Diagnosis not present

## 2020-07-20 DIAGNOSIS — G308 Other Alzheimer's disease: Secondary | ICD-10-CM | POA: Diagnosis not present

## 2020-07-20 DIAGNOSIS — G20 Parkinson's disease: Secondary | ICD-10-CM | POA: Diagnosis not present

## 2020-07-20 DIAGNOSIS — G214 Vascular parkinsonism: Secondary | ICD-10-CM | POA: Diagnosis not present

## 2020-07-20 DIAGNOSIS — R1312 Dysphagia, oropharyngeal phase: Secondary | ICD-10-CM | POA: Diagnosis not present

## 2020-07-20 DIAGNOSIS — Z8673 Personal history of transient ischemic attack (TIA), and cerebral infarction without residual deficits: Secondary | ICD-10-CM | POA: Diagnosis not present

## 2020-07-20 DIAGNOSIS — M6389 Disorders of muscle in diseases classified elsewhere, multiple sites: Secondary | ICD-10-CM | POA: Diagnosis not present

## 2020-07-20 DIAGNOSIS — R293 Abnormal posture: Secondary | ICD-10-CM | POA: Diagnosis not present

## 2020-07-20 DIAGNOSIS — M24542 Contracture, left hand: Secondary | ICD-10-CM | POA: Diagnosis not present

## 2020-07-21 ENCOUNTER — Encounter: Payer: Self-pay | Admitting: Adult Health

## 2020-07-21 DIAGNOSIS — G20 Parkinson's disease: Secondary | ICD-10-CM | POA: Diagnosis not present

## 2020-07-21 DIAGNOSIS — R1312 Dysphagia, oropharyngeal phase: Secondary | ICD-10-CM | POA: Diagnosis not present

## 2020-07-21 DIAGNOSIS — R293 Abnormal posture: Secondary | ICD-10-CM | POA: Diagnosis not present

## 2020-07-21 DIAGNOSIS — G308 Other Alzheimer's disease: Secondary | ICD-10-CM | POA: Diagnosis not present

## 2020-07-21 DIAGNOSIS — M24542 Contracture, left hand: Secondary | ICD-10-CM | POA: Diagnosis not present

## 2020-07-21 DIAGNOSIS — M6389 Disorders of muscle in diseases classified elsewhere, multiple sites: Secondary | ICD-10-CM | POA: Diagnosis not present

## 2020-07-21 DIAGNOSIS — G214 Vascular parkinsonism: Secondary | ICD-10-CM | POA: Diagnosis not present

## 2020-07-21 DIAGNOSIS — Z8673 Personal history of transient ischemic attack (TIA), and cerebral infarction without residual deficits: Secondary | ICD-10-CM | POA: Diagnosis not present

## 2020-07-21 NOTE — Progress Notes (Signed)
Location:  Occupational psychologist of Service:  SNF (31) Provider:   Cindi Carbon, ANP [place] [phone]   [name], DO  Patient Care Team: [name], DO as PCP - General (Geriatric Medicine) Danella Sensing, MD as Consulting Physician (Dermatology) Sharyne Peach, MD as Consulting Physician (Ophthalmology) Irine Seal, MD as Attending Physician (Urology) [name], MD as Consulting Physician (Neurology)  Extended Emergency Contact Information Primary Emergency Contact: Reidinger,[name] Address: [address]          [place] [address] [name] of [place] Phone: [phone] Relation: Spouse Secondary Emergency Contact: [name] States of [place] Mobile Phone: [phone] Relation: Daughter  Code Status:  DNR Goals of care: Advanced Directive information Advanced Directives 07/02/2020  Does Patient Have a Medical Advance Directive? Yes  Type of Paramedic of [place];Living will;Out of facility DNR (pink MOST or yellow form)  Does patient want to make changes to medical advance directive? -  Copy of [place] in Chart? Yes - validated most recent copy scanned in chart (See row information)  Would patient like information on creating a medical advance directive? -  Pre-existing out of facility DNR order (yellow form or pink MOST form) Yellow form placed in chart (order not valid for inpatient use);Pink MOST form placed in chart (order not valid for inpatient use)     Chief Complaint  Patient presents with   Medical Management of Chronic Issues    HPI:  Pt is a 86 y.o. male seen today for medical management of chronic diseases.    C/o worsening contractures to both hands per the nurse with mild discomfort. Tried skelaxin but didn't help and made him sleepy  Dysphagia is worsening, coughing and wheezing at times. No fever or sob. CXR neg 07/02/20.  Working with ST  BP 150-160 range  Sleeping more with memory decline over the past year  No issues with bowel movements. Appetite variable  Has pressure injury to right foot possibly due to heel boots per nurse  Past Medical History:  Diagnosis Date   Abnormality of gait    Arthritis    Brain bleed ([place]) 09/01/2016   Cervical spondylosis    Controlled type 2 diabetes mellitus without complication, without long-term current use of insulin ([place]) 06/24/2019   Degenerative joint disease (DJD) of lumbar spine    Dementia ([place]) 04/28/2019   Depression    Diplopia    Dyslipidemia    Essential tremor    Foul smelling urine    Frequent falls    GERD (gastroesophageal reflux disease)    Glycosuria    Hearing difficulty    hearing aids   Hyperlipidemia    Hypertension    Hyperthyroidism    Memory loss    Osteoarthritis    Radiculopathy, lumbar region    Sixth nerve palsy    last  left brain 11/2006  02/1998 08/2002   Small vessel disease ([place])    TIA (transient ischemic attack)    Past Surgical History:  Procedure Laterality Date   APPENDECTOMY     done as a child   GALLBLADDER SURGERY  2008   KNEE ARTHROSCOPY Left    Dr. Hart Robinsons 2002   knee injections Right    Dr. Adriana Mccallum   TONSILLECTOMY     done as a child    No Known Allergies  Outpatient Encounter Medications as of 07/19/2020  Medication Sig  amLODipine (NORVASC) 10 MG tablet Take 1 tablet (10 mg total) by mouth daily.   ammonium lactate (AMLACTIN) 12 % cream Apply 1 Bottle topically at bedtime.    aspirin EC 81 MG tablet Take 81 mg by mouth daily.   carbidopa-levodopa (SINEMET IR) 25-100 MG tablet Take 1.5 tablets by mouth 3 (three) times daily.   carboxymethylcellulose (REFRESH PLUS) 0.5 % SOLN Apply 2 drops to eye as needed (dry eyes).    clopidogrel (PLAVIX) 75 MG tablet Take 75 mg by mouth daily.   Cranberry 400 MG CAPS Take 2 capsules by mouth 3 (three) times  daily.    FLAXSEED, LINSEED, PO Take 15 mLs by mouth.   hydrALAZINE (APRESOLINE) 25 MG tablet Take 25 mg by mouth daily as needed. SBP>160   ipratropium-albuterol (DUONEB) 0.5-2.5 (3) MG/3ML SOLN Take 3 mLs by nebulization every 8 (eight) hours as needed.   MELATONIN-CHAMOMILE PO Take by mouth at bedtime as needed.   Memantine HCl-Donepezil HCl (NAMZARIC) 28-10 MG CP24 Take 1 capsule at bedtime by mouth.    Menthol, Topical Analgesic, (BIOFREEZE) 4 % GEL Apply 1 application topically 2 (two) times daily as needed (pain).    ondansetron (ZOFRAN) 4 MG tablet Take 4 mg by mouth every 8 (eight) hours as needed for nausea or vomiting.   pantoprazole (PROTONIX) 40 MG tablet Take 1 tablet (40 mg total) by mouth daily.   polyethylene glycol (MIRALAX / GLYCOLAX) packet Take 17 g by mouth every other day.   senna-docusate (SENOKOT-S) 8.6-50 MG tablet Take 2 tablets 2 (two) times daily by mouth.    sertraline (ZOLOFT) 50 MG tablet Take 50 mg by mouth at bedtime.   telmisartan (MICARDIS) 20 MG tablet Take 20 mg by mouth daily.   triamcinolone (NASACORT) 55 MCG/ACT AERO nasal inhaler Place 2 sprays into the nose at bedtime.   No facility-administered encounter medications on file as of 07/19/2020.    Review of Systems  Unable to perform ROS: Dementia    Immunization History  Administered Date(s) Administered   Influenza Inj Mdck Quad Pf 08/09/2017   Influenza, High Dose Seasonal PF 07/31/2016, 07/31/2019   Influenza,inj,Quad PF,6+ Mos 08/06/2018   Influenza-Unspecified 08/01/2010, 07/30/2012, 10/16/2012, 10/16/2013   Moderna SARS-COVID-2 Vaccination 11/18/2019, 02/23/2020   Pneumococcal Polysaccharide-23 02/23/2004, 02/21/2013   Pneumococcal-Unspecified 03/17/2016   Td 12/02/2008   Tdap 09/01/2016   Zoster 03/28/2006   Zoster Recombinat (Shingrix) 10/29/2017, 01/24/2018   Pertinent  Health Maintenance Due  Topic Date Due   INFLUENZA VACCINE  05/16/2020   HEMOGLOBIN  A1C  05/26/2020   FOOT EXAM  10/30/2020   PNA vac Low Risk Adult  Completed   OPHTHALMOLOGY EXAM  Discontinued   Fall Risk  04/09/2020 09/06/2019 01/04/2019 07/17/2018 08/21/2017  Falls in the past year? 0 1 Exclusion - non ambulatory No No  Number falls in past yr: 0 0 - - -  Injury with Fall? 0 0 - - -  Risk for fall due to : Impaired balance/gait History of fall(s) - - -  Follow up Falls evaluation completed Falls evaluation completed - - -   Functional Status Survey:    Vitals:   07/21/20 0916  Weight: 154 lb 11.2 oz (70.2 kg)   Body mass index is 23.52 kg/m.  Wt Readings from Last 3 Encounters:  07/21/20 154 lb 11.2 oz (70.2 kg)  05/25/20 158 lb 3.2 oz (71.8 kg)  04/09/20 155 lb 6.4 oz (70.5 kg)    Physical Exam Vitals and nursing note reviewed.  Constitutional:      General: He is not in acute distress.    Appearance: He is not diaphoretic.  HENT:     Head: Normocephalic and atraumatic.     Nose: Nose normal. No congestion.  Neck:     Thyroid: No thyromegaly.     Vascular: No JVD.     Trachea: No tracheal deviation.  Cardiovascular:     Rate and Rhythm: Normal rate and regular rhythm.     Heart sounds: No murmur heard.   Pulmonary:     Effort: Pulmonary effort is normal. No respiratory distress.     Breath sounds: Wheezing and rhonchi present.  Abdominal:     General: Bowel sounds are normal. There is no distension.     Palpations: Abdomen is soft.     Tenderness: There is no abdominal tenderness.  Musculoskeletal:     Cervical back: Normal range of motion and neck supple.     Right lower leg: No edema.     Left lower leg: No edema.  Lymphadenopathy:     Cervical: No cervical adenopathy.  Skin:    General: Skin is warm and dry.  Neurological:     General: No focal deficit present.     Mental Status: He is alert. Mental status is at baseline.     Comments: Decreased ROM to BUE and BLE. Contracture of left and right hand. Pain with ROM noted of left  hand   Psychiatric:        Mood and Affect: Mood normal.     Labs reviewed: Recent Labs    01/12/20 0000 01/12/20 0600  NA 141  --   K 4.0  --   CL 104  --   CO2 25*  --   BUN 20  --   CREATININE 0.9  --   CALCIUM  --  9.7   No results for input(s): AST, ALT, ALKPHOS, BILITOT, PROT, ALBUMIN in the last 8760 hours. Recent Labs    01/12/20 0000  WBC 9.7  HGB 11.2*  HCT 34*  PLT 331   Lab Results  Component Value Date   TSH 3.14 01/18/2018   Lab Results  Component Value Date   HGBA1C 7.4 11/27/2019   Lab Results  Component Value Date   CHOL 127 05/11/2017   HDL 50 05/11/2017   LDLCALC 58 05/11/2017   TRIG 98 05/11/2017    Significant Diagnostic Results in last 30 days:  No results found.  Assessment/Plan 1. Oropharyngeal dysphagia Worsening. Currently working with [place] and on a D3 diet.  Continue current recommendations. Goals of care are comfort based, DNR, avoid hospitalizations.   2. Pressure injury of right foot, unstageable (HCC) Improving, wound care provided by [facility]   3. Mixed Alzheimer's and vascular dementia ([place]) Progressive decline in cognition and physical function c/w the disease. Continue supportive care in the skilled environment.  4. 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 Followed by Neurology   6. Slow transit constipation Controlled Miralax 17 grams qod   7. Essential hypertension Slightly above goal at times but would avoid aggressive BP management due to his age and frailty   8. Contractures of hand Worse on the left. Did not respond to skelaxin. Continue OT. Would avoid further meds if possible due to possible sedation unless severe pain is present   Family/ staff Communication:  discussed with his nurse  Labs/tests ordered: BMP A1C

## 2020-07-23 DIAGNOSIS — G308 Other Alzheimer's disease: Secondary | ICD-10-CM | POA: Diagnosis not present

## 2020-07-23 DIAGNOSIS — M6389 Disorders of muscle in diseases classified elsewhere, multiple sites: Secondary | ICD-10-CM | POA: Diagnosis not present

## 2020-07-23 DIAGNOSIS — G214 Vascular parkinsonism: Secondary | ICD-10-CM | POA: Diagnosis not present

## 2020-07-23 DIAGNOSIS — Z8673 Personal history of transient ischemic attack (TIA), and cerebral infarction without residual deficits: Secondary | ICD-10-CM | POA: Diagnosis not present

## 2020-07-23 DIAGNOSIS — R1312 Dysphagia, oropharyngeal phase: Secondary | ICD-10-CM | POA: Diagnosis not present

## 2020-07-23 DIAGNOSIS — R293 Abnormal posture: Secondary | ICD-10-CM | POA: Diagnosis not present

## 2020-07-23 DIAGNOSIS — G20 Parkinson's disease: Secondary | ICD-10-CM | POA: Diagnosis not present

## 2020-07-23 DIAGNOSIS — M24542 Contracture, left hand: Secondary | ICD-10-CM | POA: Diagnosis not present

## 2020-07-26 DIAGNOSIS — R1312 Dysphagia, oropharyngeal phase: Secondary | ICD-10-CM | POA: Diagnosis not present

## 2020-07-26 DIAGNOSIS — R293 Abnormal posture: Secondary | ICD-10-CM | POA: Diagnosis not present

## 2020-07-26 DIAGNOSIS — M6389 Disorders of muscle in diseases classified elsewhere, multiple sites: Secondary | ICD-10-CM | POA: Diagnosis not present

## 2020-07-26 DIAGNOSIS — G308 Other Alzheimer's disease: Secondary | ICD-10-CM | POA: Diagnosis not present

## 2020-07-26 DIAGNOSIS — G20 Parkinson's disease: Secondary | ICD-10-CM | POA: Diagnosis not present

## 2020-07-26 DIAGNOSIS — G214 Vascular parkinsonism: Secondary | ICD-10-CM | POA: Diagnosis not present

## 2020-07-26 DIAGNOSIS — M24542 Contracture, left hand: Secondary | ICD-10-CM | POA: Diagnosis not present

## 2020-07-26 DIAGNOSIS — Z79899 Other long term (current) drug therapy: Secondary | ICD-10-CM | POA: Diagnosis not present

## 2020-07-26 DIAGNOSIS — Z8673 Personal history of transient ischemic attack (TIA), and cerebral infarction without residual deficits: Secondary | ICD-10-CM | POA: Diagnosis not present

## 2020-07-26 LAB — COMPREHENSIVE METABOLIC PANEL: Calcium: 9.7 (ref 8.7–10.7)

## 2020-07-26 LAB — HEMOGLOBIN A1C: Hemoglobin A1C: 7.4

## 2020-07-26 LAB — BASIC METABOLIC PANEL
BUN: 18 (ref 4–21)
CO2: 23 — AB (ref 13–22)
Chloride: 107 (ref 99–108)
Creatinine: 0.7 (ref 0.6–1.3)
Glucose: 143
Potassium: 4 (ref 3.4–5.3)
Sodium: 144 (ref 137–147)

## 2020-07-27 DIAGNOSIS — R293 Abnormal posture: Secondary | ICD-10-CM | POA: Diagnosis not present

## 2020-07-27 DIAGNOSIS — R1312 Dysphagia, oropharyngeal phase: Secondary | ICD-10-CM | POA: Diagnosis not present

## 2020-07-27 DIAGNOSIS — G308 Other Alzheimer's disease: Secondary | ICD-10-CM | POA: Diagnosis not present

## 2020-07-27 DIAGNOSIS — Z8673 Personal history of transient ischemic attack (TIA), and cerebral infarction without residual deficits: Secondary | ICD-10-CM | POA: Diagnosis not present

## 2020-07-27 DIAGNOSIS — G214 Vascular parkinsonism: Secondary | ICD-10-CM | POA: Diagnosis not present

## 2020-07-27 DIAGNOSIS — M6389 Disorders of muscle in diseases classified elsewhere, multiple sites: Secondary | ICD-10-CM | POA: Diagnosis not present

## 2020-07-27 DIAGNOSIS — M24542 Contracture, left hand: Secondary | ICD-10-CM | POA: Diagnosis not present

## 2020-07-27 DIAGNOSIS — G20 Parkinson's disease: Secondary | ICD-10-CM | POA: Diagnosis not present

## 2020-07-28 DIAGNOSIS — M6389 Disorders of muscle in diseases classified elsewhere, multiple sites: Secondary | ICD-10-CM | POA: Diagnosis not present

## 2020-07-28 DIAGNOSIS — Z8673 Personal history of transient ischemic attack (TIA), and cerebral infarction without residual deficits: Secondary | ICD-10-CM | POA: Diagnosis not present

## 2020-07-28 DIAGNOSIS — G214 Vascular parkinsonism: Secondary | ICD-10-CM | POA: Diagnosis not present

## 2020-07-28 DIAGNOSIS — R1312 Dysphagia, oropharyngeal phase: Secondary | ICD-10-CM | POA: Diagnosis not present

## 2020-07-28 DIAGNOSIS — G308 Other Alzheimer's disease: Secondary | ICD-10-CM | POA: Diagnosis not present

## 2020-07-28 DIAGNOSIS — M24542 Contracture, left hand: Secondary | ICD-10-CM | POA: Diagnosis not present

## 2020-07-28 DIAGNOSIS — G20 Parkinson's disease: Secondary | ICD-10-CM | POA: Diagnosis not present

## 2020-07-28 DIAGNOSIS — R293 Abnormal posture: Secondary | ICD-10-CM | POA: Diagnosis not present

## 2020-07-30 DIAGNOSIS — G214 Vascular parkinsonism: Secondary | ICD-10-CM | POA: Diagnosis not present

## 2020-07-30 DIAGNOSIS — M6389 Disorders of muscle in diseases classified elsewhere, multiple sites: Secondary | ICD-10-CM | POA: Diagnosis not present

## 2020-07-30 DIAGNOSIS — M24542 Contracture, left hand: Secondary | ICD-10-CM | POA: Diagnosis not present

## 2020-07-30 DIAGNOSIS — G20 Parkinson's disease: Secondary | ICD-10-CM | POA: Diagnosis not present

## 2020-07-30 DIAGNOSIS — Z8673 Personal history of transient ischemic attack (TIA), and cerebral infarction without residual deficits: Secondary | ICD-10-CM | POA: Diagnosis not present

## 2020-07-30 DIAGNOSIS — R1312 Dysphagia, oropharyngeal phase: Secondary | ICD-10-CM | POA: Diagnosis not present

## 2020-07-30 DIAGNOSIS — G308 Other Alzheimer's disease: Secondary | ICD-10-CM | POA: Diagnosis not present

## 2020-07-30 DIAGNOSIS — R293 Abnormal posture: Secondary | ICD-10-CM | POA: Diagnosis not present

## 2020-08-02 DIAGNOSIS — G20 Parkinson's disease: Secondary | ICD-10-CM | POA: Diagnosis not present

## 2020-08-02 DIAGNOSIS — R293 Abnormal posture: Secondary | ICD-10-CM | POA: Diagnosis not present

## 2020-08-02 DIAGNOSIS — G214 Vascular parkinsonism: Secondary | ICD-10-CM | POA: Diagnosis not present

## 2020-08-02 DIAGNOSIS — G308 Other Alzheimer's disease: Secondary | ICD-10-CM | POA: Diagnosis not present

## 2020-08-02 DIAGNOSIS — M6389 Disorders of muscle in diseases classified elsewhere, multiple sites: Secondary | ICD-10-CM | POA: Diagnosis not present

## 2020-08-02 DIAGNOSIS — M24542 Contracture, left hand: Secondary | ICD-10-CM | POA: Diagnosis not present

## 2020-08-02 DIAGNOSIS — Z8673 Personal history of transient ischemic attack (TIA), and cerebral infarction without residual deficits: Secondary | ICD-10-CM | POA: Diagnosis not present

## 2020-08-02 DIAGNOSIS — R1312 Dysphagia, oropharyngeal phase: Secondary | ICD-10-CM | POA: Diagnosis not present

## 2020-08-04 DIAGNOSIS — G20 Parkinson's disease: Secondary | ICD-10-CM | POA: Diagnosis not present

## 2020-08-04 DIAGNOSIS — R293 Abnormal posture: Secondary | ICD-10-CM | POA: Diagnosis not present

## 2020-08-04 DIAGNOSIS — R1312 Dysphagia, oropharyngeal phase: Secondary | ICD-10-CM | POA: Diagnosis not present

## 2020-08-04 DIAGNOSIS — M24542 Contracture, left hand: Secondary | ICD-10-CM | POA: Diagnosis not present

## 2020-08-04 DIAGNOSIS — Z8673 Personal history of transient ischemic attack (TIA), and cerebral infarction without residual deficits: Secondary | ICD-10-CM | POA: Diagnosis not present

## 2020-08-04 DIAGNOSIS — M6389 Disorders of muscle in diseases classified elsewhere, multiple sites: Secondary | ICD-10-CM | POA: Diagnosis not present

## 2020-08-04 DIAGNOSIS — G308 Other Alzheimer's disease: Secondary | ICD-10-CM | POA: Diagnosis not present

## 2020-08-04 DIAGNOSIS — G214 Vascular parkinsonism: Secondary | ICD-10-CM | POA: Diagnosis not present

## 2020-08-06 DIAGNOSIS — Z8673 Personal history of transient ischemic attack (TIA), and cerebral infarction without residual deficits: Secondary | ICD-10-CM | POA: Diagnosis not present

## 2020-08-06 DIAGNOSIS — G214 Vascular parkinsonism: Secondary | ICD-10-CM | POA: Diagnosis not present

## 2020-08-06 DIAGNOSIS — G308 Other Alzheimer's disease: Secondary | ICD-10-CM | POA: Diagnosis not present

## 2020-08-06 DIAGNOSIS — G20 Parkinson's disease: Secondary | ICD-10-CM | POA: Diagnosis not present

## 2020-08-06 DIAGNOSIS — R1312 Dysphagia, oropharyngeal phase: Secondary | ICD-10-CM | POA: Diagnosis not present

## 2020-08-06 DIAGNOSIS — M6389 Disorders of muscle in diseases classified elsewhere, multiple sites: Secondary | ICD-10-CM | POA: Diagnosis not present

## 2020-08-06 DIAGNOSIS — M24542 Contracture, left hand: Secondary | ICD-10-CM | POA: Diagnosis not present

## 2020-08-06 DIAGNOSIS — R293 Abnormal posture: Secondary | ICD-10-CM | POA: Diagnosis not present

## 2020-08-07 DIAGNOSIS — R1312 Dysphagia, oropharyngeal phase: Secondary | ICD-10-CM | POA: Diagnosis not present

## 2020-08-07 DIAGNOSIS — M24542 Contracture, left hand: Secondary | ICD-10-CM | POA: Diagnosis not present

## 2020-08-07 DIAGNOSIS — G308 Other Alzheimer's disease: Secondary | ICD-10-CM | POA: Diagnosis not present

## 2020-08-07 DIAGNOSIS — G20 Parkinson's disease: Secondary | ICD-10-CM | POA: Diagnosis not present

## 2020-08-07 DIAGNOSIS — G214 Vascular parkinsonism: Secondary | ICD-10-CM | POA: Diagnosis not present

## 2020-08-07 DIAGNOSIS — R293 Abnormal posture: Secondary | ICD-10-CM | POA: Diagnosis not present

## 2020-08-07 DIAGNOSIS — Z8673 Personal history of transient ischemic attack (TIA), and cerebral infarction without residual deficits: Secondary | ICD-10-CM | POA: Diagnosis not present

## 2020-08-07 DIAGNOSIS — M6389 Disorders of muscle in diseases classified elsewhere, multiple sites: Secondary | ICD-10-CM | POA: Diagnosis not present

## 2020-08-09 DIAGNOSIS — G214 Vascular parkinsonism: Secondary | ICD-10-CM | POA: Diagnosis not present

## 2020-08-09 DIAGNOSIS — R293 Abnormal posture: Secondary | ICD-10-CM | POA: Diagnosis not present

## 2020-08-09 DIAGNOSIS — Z8673 Personal history of transient ischemic attack (TIA), and cerebral infarction without residual deficits: Secondary | ICD-10-CM | POA: Diagnosis not present

## 2020-08-09 DIAGNOSIS — M24542 Contracture, left hand: Secondary | ICD-10-CM | POA: Diagnosis not present

## 2020-08-09 DIAGNOSIS — M6389 Disorders of muscle in diseases classified elsewhere, multiple sites: Secondary | ICD-10-CM | POA: Diagnosis not present

## 2020-08-09 DIAGNOSIS — G20 Parkinson's disease: Secondary | ICD-10-CM | POA: Diagnosis not present

## 2020-08-09 DIAGNOSIS — R1312 Dysphagia, oropharyngeal phase: Secondary | ICD-10-CM | POA: Diagnosis not present

## 2020-08-09 DIAGNOSIS — G308 Other Alzheimer's disease: Secondary | ICD-10-CM | POA: Diagnosis not present

## 2020-08-10 ENCOUNTER — Encounter: Payer: Self-pay | Admitting: Internal Medicine

## 2020-08-12 ENCOUNTER — Encounter (HOSPITAL_COMMUNITY): Payer: Self-pay

## 2020-08-12 ENCOUNTER — Emergency Department (HOSPITAL_COMMUNITY): Payer: Medicare Other

## 2020-08-12 ENCOUNTER — Emergency Department (HOSPITAL_COMMUNITY)
Admission: EM | Admit: 2020-08-12 | Discharge: 2020-08-13 | Disposition: A | Discharge status: Skilled Nursing Facility | Payer: Medicare Other | Attending: Emergency Medicine | Admitting: Emergency Medicine

## 2020-08-12 DIAGNOSIS — T83091A Other mechanical complication of indwelling urethral catheter, initial encounter: Secondary | ICD-10-CM | POA: Diagnosis not present

## 2020-08-12 DIAGNOSIS — F0390 Unspecified dementia without behavioral disturbance: Secondary | ICD-10-CM | POA: Insufficient documentation

## 2020-08-12 DIAGNOSIS — N201 Calculus of ureter: Secondary | ICD-10-CM

## 2020-08-12 DIAGNOSIS — R6889 Other general symptoms and signs: Secondary | ICD-10-CM | POA: Diagnosis not present

## 2020-08-12 DIAGNOSIS — Z743 Need for continuous supervision: Secondary | ICD-10-CM | POA: Diagnosis not present

## 2020-08-12 DIAGNOSIS — E119 Type 2 diabetes mellitus without complications: Secondary | ICD-10-CM | POA: Insufficient documentation

## 2020-08-12 DIAGNOSIS — N130 Hydronephrosis with ureteropelvic junction obstruction: Secondary | ICD-10-CM | POA: Diagnosis not present

## 2020-08-12 DIAGNOSIS — R404 Transient alteration of awareness: Secondary | ICD-10-CM | POA: Diagnosis not present

## 2020-08-12 DIAGNOSIS — Z79899 Other long term (current) drug therapy: Secondary | ICD-10-CM | POA: Diagnosis not present

## 2020-08-12 DIAGNOSIS — N200 Calculus of kidney: Secondary | ICD-10-CM | POA: Diagnosis not present

## 2020-08-12 DIAGNOSIS — N2889 Other specified disorders of kidney and ureter: Secondary | ICD-10-CM | POA: Diagnosis not present

## 2020-08-12 DIAGNOSIS — N138 Other obstructive and reflux uropathy: Secondary | ICD-10-CM | POA: Diagnosis not present

## 2020-08-12 DIAGNOSIS — T83098A Other mechanical complication of other indwelling urethral catheter, initial encounter: Secondary | ICD-10-CM | POA: Diagnosis not present

## 2020-08-12 DIAGNOSIS — R109 Unspecified abdominal pain: Secondary | ICD-10-CM | POA: Insufficient documentation

## 2020-08-12 DIAGNOSIS — Y733 Surgical instruments, materials and gastroenterology and urology devices (including sutures) associated with adverse incidents: Secondary | ICD-10-CM | POA: Diagnosis not present

## 2020-08-12 DIAGNOSIS — R5381 Other malaise: Secondary | ICD-10-CM | POA: Diagnosis not present

## 2020-08-12 DIAGNOSIS — T839XXA Unspecified complication of genitourinary prosthetic device, implant and graft, initial encounter: Secondary | ICD-10-CM

## 2020-08-12 DIAGNOSIS — I10 Essential (primary) hypertension: Secondary | ICD-10-CM | POA: Insufficient documentation

## 2020-08-12 LAB — URINALYSIS, ROUTINE W REFLEX MICROSCOPIC
Bilirubin Urine: NEGATIVE
Glucose, UA: NEGATIVE mg/dL
Ketones, ur: 5 mg/dL — AB
Nitrite: POSITIVE — AB
Protein, ur: 30 mg/dL — AB
RBC / HPF: >50 RBC/hpf — ABNORMAL HIGH (ref 0–5)
Specific Gravity, Urine: 1.016 (ref 1.005–1.030)
WBC, UA: >50 WBC/hpf — ABNORMAL HIGH (ref 0–5)
pH: 6 (ref 5.0–8.0)

## 2020-08-12 LAB — BASIC METABOLIC PANEL
Anion gap: 11 (ref 5–15)
BUN: 21 mg/dL (ref 8–23)
CO2: 23 mmol/L (ref 22–32)
Calcium: 9.3 mg/dL (ref 8.9–10.3)
Chloride: 106 mmol/L (ref 98–111)
Creatinine, Ser: 0.84 mg/dL (ref 0.61–1.24)
GFR, Estimated: >60 mL/min (ref >60)
Glucose, Bld: 144 mg/dL — ABNORMAL HIGH (ref 70–99)
Potassium: 3.5 mmol/L (ref 3.5–5.1)
Sodium: 140 mmol/L (ref 135–145)

## 2020-08-12 LAB — CBC WITH DIFFERENTIAL/PLATELET
Abs Immature Granulocytes: 0.02 10*3/uL (ref 0.00–0.07)
Basophils Absolute: 0 10*3/uL (ref 0.0–0.1)
Basophils Relative: 0 %
Eosinophils Absolute: 0.5 10*3/uL (ref 0.0–0.5)
Eosinophils Relative: 5 %
HCT: 33.9 % — ABNORMAL LOW (ref 39.0–52.0)
Hemoglobin: 11 g/dL — ABNORMAL LOW (ref 13.0–17.0)
Immature Granulocytes: 0 %
Lymphocytes Relative: 24 %
Lymphs Abs: 2.3 10*3/uL (ref 0.7–4.0)
MCH: 27.8 pg (ref 26.0–34.0)
MCHC: 32.4 g/dL (ref 30.0–36.0)
MCV: 85.6 fL (ref 80.0–100.0)
Monocytes Absolute: 0.8 10*3/uL (ref 0.1–1.0)
Monocytes Relative: 9 %
Neutro Abs: 5.8 10*3/uL (ref 1.7–7.7)
Neutrophils Relative %: 62 %
Platelets: 303 10*3/uL (ref 150–400)
RBC: 3.96 MIL/uL — ABNORMAL LOW (ref 4.22–5.81)
RDW: 15.8 % — ABNORMAL HIGH (ref 11.5–15.5)
WBC: 9.3 10*3/uL (ref 4.0–10.5)
nRBC: 0 % (ref 0.0–0.2)

## 2020-08-12 MED ORDER — FENTANYL CITRATE (PF) 100 MCG/2ML IJ SOLN
25.0000 ug | Freq: Once | INTRAMUSCULAR | Status: AC
  Administered 2020-08-12: 25 ug via INTRAVENOUS
  Filled 2020-08-12: qty 2

## 2020-08-12 NOTE — ED Provider Notes (Signed)
[name] DEPT Provider Note   CSN: [id number] Arrival date & time: 08/12/20  1858     History Chief Complaint  Patient presents with  . foley placement    pt foley was removed to have it repleced and nurses at [facility] were unable to replace foley     Kenneth Stark is a 86 y.o. male past medical history of Alzheimer's dementia, CVA, type 2 diabetes, BPH with chronic indwelling Foley catheter, presenting to the ED from [facility] facility for Foley placement.  Patient was having his Foley exchanged, which it appears he has done every 4 weeks, followed by Dr. Jeffie Pollock.  Nursing staff at the facility was unable to replace the Foley after multiple times therefore was sent to the ED for Foley placement.  It is noted per EMS and per nursing staff who called nursing facility to confirm, patient is at his mental baseline today.  They have no other concerns for which she was sent to the ED.  Unable to obtain ROS from patient secondary to dementia.  The history is provided by the nursing home and the EMS personnel.       Past Medical History:  Diagnosis Date  . Abnormality of gait   . Arthritis   . Brain bleed ([place]) 09/01/2016  . Cervical spondylosis   . Controlled type 2 diabetes mellitus without complication, without long-term current use of insulin ([place]) 06/24/2019  . Degenerative joint disease (DJD) of lumbar spine   . Dementia ([place]) 04/28/2019  . Depression   . Diplopia   . Dyslipidemia   . Essential tremor   . Foul smelling urine   . Frequent falls   . GERD (gastroesophageal reflux disease)   . Glycosuria   . Hearing difficulty    hearing aids  . Hyperlipidemia   . Hypertension   . Hyperthyroidism   . Memory loss   . Osteoarthritis   . Radiculopathy, lumbar region   . 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 Small vessel disease ([place])   . TIA (transient ischemic attack)     Patient Active  Problem List   Diagnosis Date Noted  . Controlled type 2 diabetes mellitus without complication, without long-term current use of insulin ([place]) 06/24/2019  . GERD (gastroesophageal reflux disease) 02/05/2018  . Chronic indwelling Foley catheter 11/28/2017  . Constipation 09/03/2017  . Situational depression 07/30/2017  . Parkinsonism ([place]) 04/04/2017  . Sensorineural hearing loss (SNHL), bilateral 02/08/2017  . BPH (benign prostatic hyperplasia) 10/31/2016  . Mixed Alzheimer's and vascular dementia ([place]) 10/31/2016  . Subarachnoid hemorrhage ([place]) 09/07/2016  . Anemia 09/07/2016  . Cerebrovascular disease 09/01/2016  . History of TIA (transient ischemic attack) 09/01/2016  . Essential hypertension   . Sixth nerve palsy   . Cervical spondylosis   . Degenerative joint disease (DJD) of lumbar spine   . Arthritis   . Dyslipidemia   . Gait disorder 11/27/2013    Past Surgical History:  Procedure Laterality Date  . APPENDECTOMY     done as a child  . GALLBLADDER SURGERY  2008  . KNEE ARTHROSCOPY Left    Dr. Hart Robinsons 2002  . knee injections Right    Dr. Adriana Mccallum  . TONSILLECTOMY     done as a child       Family History  Problem Relation Age of Onset  . Stroke Mother   . Dementia Mother   .  Stroke Father   . Heart disease Father   . Diabetes Father   . Dementia Brother   . Renal Disease Brother   . Diabetes Brother   . Renal Disease Daughter     Social History   Tobacco Use  . Smoking status: Never Smoker  . Smokeless tobacco: Never Used  Substance Use Topics  . Alcohol use: Yes    Comment: Wife occass/rare  . Drug use: No    Home Medications Prior to Admission medications   Medication Sig Start Date End Date Taking? Authorizing Provider  amLODipine (NORVASC) 10 MG tablet Take 1 tablet (10 mg total) by mouth daily. 05/29/17   Reed, Tiffany L, DO  ammonium lactate (AMLACTIN) 12 % cream Apply 1 Bottle topically at bedtime.     [provider]    aspirin EC 81 MG tablet Take 81 mg by mouth daily.    [provider]  carbidopa-levodopa (SINEMET IR) 25-100 MG tablet Take 1.5 tablets by mouth 3 (three) times daily. 09/18/17   [name], MD  carboxymethylcellulose (REFRESH PLUS) 0.5 % SOLN Apply 2 drops to eye as needed (dry eyes).     [provider]  cephALEXin (KEFLEX) 500 MG capsule Take 1 capsule (500 mg total) by mouth 3 (three) times daily for 7 days. 08/13/20 08/20/20  Fowler Antos, [place] N, PA-C  clopidogrel (PLAVIX) 75 MG tablet Take 75 mg by mouth daily.    [provider]  Cranberry 400 MG CAPS Take 2 capsules by mouth 3 (three) times daily.     [provider]  FLAXSEED, LINSEED, PO Take 15 mLs by mouth.    [provider]  hydrALAZINE (APRESOLINE) 25 MG tablet Take 25 mg by mouth daily as needed. SBP>160    [provider]  ipratropium-albuterol (DUONEB) 0.5-2.5 (3) MG/3ML SOLN Take 3 mLs by nebulization every 8 (eight) hours as needed. 01/13/19   [provider]  MELATONIN-CHAMOMILE PO Take by mouth at bedtime as needed.    [provider]  Memantine HCl-Donepezil HCl (NAMZARIC) 28-10 MG CP24 Take 1 capsule at bedtime by mouth.     [provider]  Menthol, Topical Analgesic, (BIOFREEZE) 4 % GEL Apply 1 application topically 2 (two) times daily as needed (pain).     [provider]  ondansetron (ZOFRAN) 4 MG tablet Take 4 mg by mouth every 8 (eight) hours as needed for nausea or vomiting.    [provider]  pantoprazole (PROTONIX) 40 MG tablet Take 1 tablet (40 mg total) by mouth daily. 11/30/17   Ghimire, Henreitta Leber, MD  polyethylene glycol (MIRALAX / Floria Raveling) packet Take 17 g by mouth every other day.    [provider]  senna-docusate (SENOKOT-S) 8.6-50 MG tablet Take 2 tablets 2 (two) times daily by mouth.     [provider]  sertraline (ZOLOFT) 50 MG tablet Take 50 mg by mouth at bedtime.    [provider]  telmisartan (MICARDIS) 20 MG tablet Take 20 mg by mouth daily.    [provider]  triamcinolone (NASACORT) 55 MCG/ACT AERO nasal inhaler Place 2 sprays into the nose at bedtime.    [provider]    Allergies    Patient has no known allergies.  Review of Systems   Review of Systems  Unable to perform ROS: Dementia    Physical Exam Updated Vital Signs BP (!) 173/78   Pulse 95   Temp 98.3 F (36.8 C)   Resp  20   SpO2 100%   Physical Exam Vitals and nursing note reviewed.  Constitutional:      General: He is not in acute distress.    Appearance: He is well-developed.  HENT:     Head: Normocephalic and atraumatic.  Eyes:     Conjunctiva/sclera: Conjunctivae normal.  Cardiovascular:     Rate and Rhythm: Normal rate and regular rhythm.  Pulmonary:     Effort: Pulmonary effort is normal.     Breath sounds: Normal breath sounds.  Abdominal:     General: Bowel sounds are normal.     Palpations: Abdomen is soft.     Tenderness: There is no abdominal tenderness.  Skin:    General: Skin is warm.  Neurological:     Mental Status: He is alert. Mental status is at baseline.  Psychiatric:        Behavior: Behavior normal.     ED Results / Procedures / Treatments   Labs (all labs ordered are listed, but only abnormal results are displayed) Labs Reviewed  URINALYSIS, ROUTINE W REFLEX MICROSCOPIC - Abnormal; Notable for the following components:      Result Value   APPearance HAZY (*)    Hgb urine dipstick MODERATE (*)    Ketones, ur 5 (*)    Protein, ur 30 (*)    Nitrite POSITIVE (*)    Leukocytes,Ua LARGE (*)    RBC / HPF >50 (*)    WBC, UA >50 (*)    Bacteria, UA MANY (*)    All other components within normal limits  BASIC METABOLIC PANEL - Abnormal; Notable for the following components:   Glucose, Bld 144 (*)    All other components within normal limits  CBC WITH DIFFERENTIAL/PLATELET - Abnormal; Notable for the following  components:   RBC 3.96 (*)    Hemoglobin 11.0 (*)    HCT 33.9 (*)    RDW 15.8 (*)    All other components within normal limits  URINE CULTURE    EKG None  Radiology CT Renal Stone Study  Result Date: 08/12/2020 CLINICAL DATA:  Flank pain EXAM: CT ABDOMEN AND PELVIS WITHOUT CONTRAST TECHNIQUE: Multidetector CT imaging of the abdomen and pelvis was performed following the standard protocol without IV contrast. COMPARISON:  None. FINDINGS: LOWER CHEST: Normal. HEPATOBILIARY: Normal hepatic contours. No intra- or extrahepatic biliary dilatation. Status post cholecystectomy. PANCREAS: Normal pancreas. No ductal dilatation or peripancreatic fluid collection. SPLEEN: Normal. ADRENALS/URINARY TRACT: The adrenal glands are normal. 6 mm right lower pole renal calculus, nonobstructive. At the left ureteropelvic junction, there is an obstructive calculus measuring 17 mm. There is left renal pelvic dilatation. There are other nonobstructing left renal stones that measure to 12 mm. Mild left perinephric stranding. Decompressed by Foley catheter. STOMACH/BOWEL: There is no hiatal hernia. Normal duodenal course and caliber. No small bowel dilatation or inflammation. Rectosigmoid diverticulosis without acute inflammation. Normal appendix. VASCULAR/LYMPHATIC: There is calcific atherosclerosis of the abdominal aorta. No lymphadenopathy. REPRODUCTIVE: Enlarged prostate measures 5.9 cm in transverse dimension. MUSCULOSKELETAL. No bony spinal canal stenosis or focal osseous abnormality. OTHER: None. IMPRESSION: 1. Left-sided obstructive uropathy with 17 mm calculus at the left ureteropelvic junction and mild left perinephric stranding. 2. Bilateral nonobstructing renal stones measuring up to 12 mm on the left and 6 mm on the right. 3.  Aortic atherosclerosis (ICD10-I70.0). Electronically Signed   By: Ulyses Jarred M.D.   On: 08/12/2020 22:00    Procedures Procedures (including critical care time)  Medications  Ordered  in ED Medications  fentaNYL (SUBLIMAZE) injection 25 mcg (25 mcg Intravenous Given 08/12/20 2300)    ED Course  I have reviewed the triage vital signs and the nursing notes.  Pertinent labs & imaging results that were available during my care of the patient were reviewed by me and considered in my medical decision making (see chart for details).  Clinical Course as of Aug 13 20  Thu Aug 12, 2020  2114 Patient now has draining Foley. UA was ordered per nursing staff. Patient has chronic indwelling Foley catheter, no fevers or changes in mental status. Do not believe he warrants antibiotics.   [JR]  2221 Discussed with Dr.Newsome, urologist.  Recommends patient can follow closely tomorrow in the clinic if pain is managed, pt is not septic and no significant renal impairment. If workup is abnormal, will admit for stenting tomorrow.   [JR]  Fri Aug 13, 2020  0015 Inaccurate reading, patient fidgeting with pulse oximeter.   SpO2(!): 72 % [JR]    Clinical Course User Index [JR] Rondalyn Belford, [place] N, PA-C   MDM Rules/Calculators/A&P                          Patient brought in from assisted living facility for foley catheter placement, after staff at facility was unable to replace new foley during foley exchange. It was confirmed by EMS and again by RN by phone to nursing facility that patient is at mental baseline. Per paperwork accompanying patient from facility, he is followed by Dr. Jeffie Pollock and has foley in place for BPH.  ED able to successfully place Foley, draining yellow urine. However, patient appears to continue to moan intermittently. CT stone study was ordered to evaluate proper placement of foley.   Foley appears properly placed however incidental findings of :  Left-sided obstructive uropathy with 17 mm calculus at the left ureteropelvic junction and mild left perinephric stranding. 2. Bilateral nonobstructing renal stones measuring up to 12 mm on the left and 6 mm on the  right.   Given size of stone noted on CT, consult placed to urologist, Dr. Milford Cage.  Recommends if patient is not septic, normal renal function, and pain is managed well, patient can be seen in the clinic tomorrow for further management.  However if requires admission, urology will evaluate tomorrow for stenting.  He does recommend treat UA findings.  Urine culture is sent.  Pain seems managed well, patient is sleeping on reevaluation.  Renal function is reassuring with a creatinine of 0.8, BUN of 21.  No leukocytosis. Afebrile on arrival. Patients son at bedside is comfortable with plan for discharge and outpatient follow-up.    Final Clinical Impression(s) / ED Diagnoses Final diagnoses:  Difficult Foley catheter placement ([place])  Obstruction of left ureteropelvic junction (UPJ) due to stone    Rx / DC Orders ED Discharge Orders         Ordered    cephALEXin (KEFLEX) 500 MG capsule  3 times daily        08/13/20 0019           Eaden Hettinger, [place] N, PA-C 08/13/20 0026    Daleen Bo, MD 08/14/20 [phone]

## 2020-08-12 NOTE — ED Provider Notes (Signed)
  Face-to-face evaluation   History: Patient is here because his Foley could not be replaced after 1 was removed, for routine maintenance.  Physical exam: Elderly, alert and cooperative. I evaluate the patient after nursing staff replaced his Foley catheter. He appears somewhat uncomfortable. Palpation of the abdominal, reveals a probable distended bladder. Will order CT scan to define anatomy.  Medical screening examination/treatment/procedure(s) were conducted as a shared visit with non-physician practitioner(s) and myself.  I personally evaluated the patient during the encounter    Daleen Bo, MD 08/14/20 [phone]

## 2020-08-12 NOTE — ED Notes (Signed)
[facility] on call at request of PA- [place]

## 2020-08-13 DIAGNOSIS — R279 Unspecified lack of coordination: Secondary | ICD-10-CM | POA: Diagnosis not present

## 2020-08-13 DIAGNOSIS — G214 Vascular parkinsonism: Secondary | ICD-10-CM | POA: Diagnosis not present

## 2020-08-13 DIAGNOSIS — Z8673 Personal history of transient ischemic attack (TIA), and cerebral infarction without residual deficits: Secondary | ICD-10-CM | POA: Diagnosis not present

## 2020-08-13 DIAGNOSIS — G308 Other Alzheimer's disease: Secondary | ICD-10-CM | POA: Diagnosis not present

## 2020-08-13 DIAGNOSIS — M24542 Contracture, left hand: Secondary | ICD-10-CM | POA: Diagnosis not present

## 2020-08-13 DIAGNOSIS — R293 Abnormal posture: Secondary | ICD-10-CM | POA: Diagnosis not present

## 2020-08-13 DIAGNOSIS — G20 Parkinson's disease: Secondary | ICD-10-CM | POA: Diagnosis not present

## 2020-08-13 DIAGNOSIS — M6389 Disorders of muscle in diseases classified elsewhere, multiple sites: Secondary | ICD-10-CM | POA: Diagnosis not present

## 2020-08-13 DIAGNOSIS — Z743 Need for continuous supervision: Secondary | ICD-10-CM | POA: Diagnosis not present

## 2020-08-13 DIAGNOSIS — R1312 Dysphagia, oropharyngeal phase: Secondary | ICD-10-CM | POA: Diagnosis not present

## 2020-08-13 DIAGNOSIS — R531 Weakness: Secondary | ICD-10-CM | POA: Diagnosis not present

## 2020-08-13 MED ORDER — CEPHALEXIN 500 MG PO CAPS: 500.0000 mg | ORAL_CAPSULE | Freq: Three times a day (TID) | ORAL | 0 refills | Status: AC

## 2020-08-13 NOTE — Discharge Instructions (Addendum)
Please give Kenneth Stark tylenol every 6 hours for pain.  Please give him the antibiotic, keflex, three times daily. He will need close follow up with his urologist tomorrow regarding large 50mm stone in his left ureteropelvic junction (just leaving the kidney in the ureter). Return for uncontrollable pain, fever, or other concerning symptoms.

## 2020-08-13 NOTE — ED Notes (Signed)
Report called to [facility] rn

## 2020-08-13 NOTE — ED Notes (Signed)
Assumed care of patient at this time, nad noted, sr up x2, bed locked and low, call bell w/I reach.  Will continue to monitor.  Indwelling catheter is draining well and patent and intact.

## 2020-08-16 DIAGNOSIS — M24542 Contracture, left hand: Secondary | ICD-10-CM | POA: Diagnosis not present

## 2020-08-16 DIAGNOSIS — R338 Other retention of urine: Secondary | ICD-10-CM | POA: Diagnosis not present

## 2020-08-16 DIAGNOSIS — M2550 Pain in unspecified joint: Secondary | ICD-10-CM | POA: Diagnosis not present

## 2020-08-16 DIAGNOSIS — Z743 Need for continuous supervision: Secondary | ICD-10-CM | POA: Diagnosis not present

## 2020-08-16 DIAGNOSIS — R6889 Other general symptoms and signs: Secondary | ICD-10-CM | POA: Diagnosis not present

## 2020-08-16 DIAGNOSIS — R404 Transient alteration of awareness: Secondary | ICD-10-CM | POA: Diagnosis not present

## 2020-08-16 DIAGNOSIS — M6389 Disorders of muscle in diseases classified elsewhere, multiple sites: Secondary | ICD-10-CM | POA: Diagnosis not present

## 2020-08-16 DIAGNOSIS — R293 Abnormal posture: Secondary | ICD-10-CM | POA: Diagnosis not present

## 2020-08-16 DIAGNOSIS — I499 Cardiac arrhythmia, unspecified: Secondary | ICD-10-CM | POA: Diagnosis not present

## 2020-08-16 DIAGNOSIS — N200 Calculus of kidney: Secondary | ICD-10-CM | POA: Diagnosis not present

## 2020-08-16 DIAGNOSIS — G20 Parkinson's disease: Secondary | ICD-10-CM | POA: Diagnosis not present

## 2020-08-16 LAB — URINE CULTURE: Culture: >=100000 — AB

## 2020-08-17 ENCOUNTER — Telehealth: Payer: Self-pay | Admitting: Emergency Medicine

## 2020-08-17 NOTE — Telephone Encounter (Signed)
Post ED Visit - Positive Culture Follow-up: Successful Patient Follow-Up  Culture assessed and recommendations reviewed by:  []  Elenor Quinones, Pharm.D. []  Heide Guile, Pharm.D., BCPS AQ-ID []  Parks Neptune, Pharm.D., BCPS []  Alycia Rossetti, Pharm.D., BCPS []  Platter, Pharm.D., BCPS, AAHIVP []  Legrand Como, Pharm.D., BCPS, AAHIVP []  Salome Arnt, PharmD, BCPS []  Johnnette Gourd, PharmD, BCPS []  Hughes Better, PharmD, BCPS []  Leeroy Cha, PharmD  Positive urine culture  []  Patient discharged without antimicrobial prescription and treatment is now indicated [x]  Organism is resistant to prescribed ED discharge antimicrobial []  Patient with positive blood cultures  Changes discussed with ED provider: Domenic Moras PA Stop cephalexin, culture report faxed to [facility] @ [facility] [phone] also faxed to [facility] @ [phone]      Hazle Nordmann 08/17/2020, 10:16 AM

## 2020-08-17 NOTE — Progress Notes (Signed)
ED Antimicrobial Stewardship Positive Culture Follow Up   Kenneth Stark is an 86 y.o. male who presented to [facility] on 08/12/2020 with a chief complaint of  Chief Complaint  Patient presents with  . foley placement    pt foley was removed to have it repleced and nurses at [facility] were unable to replace foley     Recent Results (from the past 720 hour(s))  Urine culture     Status: Abnormal   Collection Time: 08/12/20  8:34 PM   Specimen: Urine, Catheterized  Result Value Ref Range Status   Specimen Description   Final    URINE, CATHETERIZED Performed at [facility] [address] [place], [place] [address]    Special Requests   Final    NONE Performed at [facility], [place] [address] [place], [place] [address]    Culture (A)  Final    >=100,000 COLONIES/mL ESCHERICHIA COLI Confirmed Extended Spectrum Beta-Lactamase Producer (ESBL).  In bloodstream infections from ESBL organisms, carbapenems are preferred over piperacillin/tazobactam. They are shown to have a lower risk of mortality. 20,000 COLONIES/mL KLEBSIELLA PNEUMONIAE    Report Status 08/16/2020 FINAL  Final   Organism ID, Bacteria ESCHERICHIA COLI (A)  Final   Organism ID, Bacteria KLEBSIELLA PNEUMONIAE (A)  Final      Susceptibility   Escherichia coli - MIC*    AMPICILLIN >=32 RESISTANT Resistant     CEFAZOLIN >=64 RESISTANT Resistant     CEFTRIAXONE >=64 RESISTANT Resistant     CIPROFLOXACIN >=4 RESISTANT Resistant     GENTAMICIN <=1 SENSITIVE Sensitive     IMIPENEM <=0.25 SENSITIVE Sensitive     NITROFURANTOIN 64 INTERMEDIATE Intermediate     TRIMETH/SULFA >=320 RESISTANT Resistant     AMPICILLIN/SULBACTAM >=32 RESISTANT Resistant     PIP/TAZO 16 SENSITIVE Sensitive     * >=100,000 COLONIES/mL ESCHERICHIA COLI   Klebsiella pneumoniae - MIC*    AMPICILLIN RESISTANT Resistant     CEFAZOLIN <=4 SENSITIVE Sensitive     CEFTRIAXONE <=0.25 SENSITIVE Sensitive      CIPROFLOXACIN <=0.25 SENSITIVE Sensitive     GENTAMICIN <=1 SENSITIVE Sensitive     IMIPENEM <=0.25 SENSITIVE Sensitive     NITROFURANTOIN 64 INTERMEDIATE Intermediate     TRIMETH/SULFA <=20 SENSITIVE Sensitive     AMPICILLIN/SULBACTAM 4 SENSITIVE Sensitive     PIP/TAZO <=4 SENSITIVE Sensitive     * 20,000 COLONIES/mL KLEBSIELLA PNEUMONIAE    [x]  Treated with cephalexin, organism resistant to prescribed antimicrobial []  Patient discharged originally without antimicrobial agent and treatment is now indicated   Plan to stop cephalexin and fax culture results to urology for follow-up.  ED Provider: Domenic Moras, PA-C   Esmeralda Links, PharmD Candidate 08/17/2020, 9:50 AM

## 2020-08-18 DIAGNOSIS — M6389 Disorders of muscle in diseases classified elsewhere, multiple sites: Secondary | ICD-10-CM | POA: Diagnosis not present

## 2020-08-18 DIAGNOSIS — G20 Parkinson's disease: Secondary | ICD-10-CM | POA: Diagnosis not present

## 2020-08-18 DIAGNOSIS — M24542 Contracture, left hand: Secondary | ICD-10-CM | POA: Diagnosis not present

## 2020-08-18 DIAGNOSIS — R293 Abnormal posture: Secondary | ICD-10-CM | POA: Diagnosis not present

## 2020-08-20 DIAGNOSIS — M24542 Contracture, left hand: Secondary | ICD-10-CM | POA: Diagnosis not present

## 2020-08-20 DIAGNOSIS — M6389 Disorders of muscle in diseases classified elsewhere, multiple sites: Secondary | ICD-10-CM | POA: Diagnosis not present

## 2020-08-20 DIAGNOSIS — G20 Parkinson's disease: Secondary | ICD-10-CM | POA: Diagnosis not present

## 2020-08-20 DIAGNOSIS — R293 Abnormal posture: Secondary | ICD-10-CM | POA: Diagnosis not present

## 2020-08-23 DIAGNOSIS — M24542 Contracture, left hand: Secondary | ICD-10-CM | POA: Diagnosis not present

## 2020-08-23 DIAGNOSIS — M6389 Disorders of muscle in diseases classified elsewhere, multiple sites: Secondary | ICD-10-CM | POA: Diagnosis not present

## 2020-08-23 DIAGNOSIS — G20 Parkinson's disease: Secondary | ICD-10-CM | POA: Diagnosis not present

## 2020-08-23 DIAGNOSIS — R293 Abnormal posture: Secondary | ICD-10-CM | POA: Diagnosis not present

## 2020-08-25 DIAGNOSIS — M24542 Contracture, left hand: Secondary | ICD-10-CM | POA: Diagnosis not present

## 2020-08-25 DIAGNOSIS — R293 Abnormal posture: Secondary | ICD-10-CM | POA: Diagnosis not present

## 2020-08-25 DIAGNOSIS — G20 Parkinson's disease: Secondary | ICD-10-CM | POA: Diagnosis not present

## 2020-08-25 DIAGNOSIS — M6389 Disorders of muscle in diseases classified elsewhere, multiple sites: Secondary | ICD-10-CM | POA: Diagnosis not present

## 2020-08-27 ENCOUNTER — Non-Acute Institutional Stay (SKILLED_NURSING_FACILITY): Payer: Medicare Other | Admitting: Adult Health

## 2020-08-27 DIAGNOSIS — G20 Parkinson's disease: Secondary | ICD-10-CM | POA: Diagnosis not present

## 2020-08-27 DIAGNOSIS — N201 Calculus of ureter: Secondary | ICD-10-CM

## 2020-08-27 DIAGNOSIS — R1312 Dysphagia, oropharyngeal phase: Secondary | ICD-10-CM

## 2020-08-27 DIAGNOSIS — R634 Abnormal weight loss: Secondary | ICD-10-CM

## 2020-08-27 DIAGNOSIS — F0280 Dementia in other diseases classified elsewhere without behavioral disturbance: Secondary | ICD-10-CM

## 2020-08-27 DIAGNOSIS — I10 Essential (primary) hypertension: Secondary | ICD-10-CM

## 2020-08-27 DIAGNOSIS — R339 Retention of urine, unspecified: Secondary | ICD-10-CM

## 2020-08-27 DIAGNOSIS — R293 Abnormal posture: Secondary | ICD-10-CM | POA: Diagnosis not present

## 2020-08-27 DIAGNOSIS — G309 Alzheimer's disease, unspecified: Secondary | ICD-10-CM | POA: Diagnosis not present

## 2020-08-27 DIAGNOSIS — M24542 Contracture, left hand: Secondary | ICD-10-CM | POA: Diagnosis not present

## 2020-08-27 DIAGNOSIS — M6389 Disorders of muscle in diseases classified elsewhere, multiple sites: Secondary | ICD-10-CM | POA: Diagnosis not present

## 2020-08-27 DIAGNOSIS — F0150 Vascular dementia without behavioral disturbance: Secondary | ICD-10-CM

## 2020-08-30 ENCOUNTER — Encounter: Payer: Self-pay | Admitting: Adult Health

## 2020-08-30 DIAGNOSIS — M24542 Contracture, left hand: Secondary | ICD-10-CM | POA: Diagnosis not present

## 2020-08-30 DIAGNOSIS — N201 Calculus of ureter: Secondary | ICD-10-CM | POA: Insufficient documentation

## 2020-08-30 DIAGNOSIS — R293 Abnormal posture: Secondary | ICD-10-CM | POA: Diagnosis not present

## 2020-08-30 DIAGNOSIS — M6389 Disorders of muscle in diseases classified elsewhere, multiple sites: Secondary | ICD-10-CM | POA: Diagnosis not present

## 2020-08-30 DIAGNOSIS — G20 Parkinson's disease: Secondary | ICD-10-CM | POA: Diagnosis not present

## 2020-08-30 NOTE — Progress Notes (Signed)
Location:  [facility] of Service:  SNF (31) Provider:   Cindi Carbon, ANP [place] [phone]   Kenneth Curry, DO  Patient Care Team: Kenneth Curry, DO as PCP - General (Geriatric Medicine) Danella Sensing, MD as Consulting Physician (Dermatology) Sharyne Peach, MD as Consulting Physician (Ophthalmology) Irine Seal, MD as Attending Physician (Urology) [name], MD as Consulting Physician (Neurology)  Extended Emergency Contact Information Primary Emergency Contact: Kenneth Stark Address: [address]           [address] [name] of [place] Phone: [phone] Relation: Spouse Secondary Emergency Contact: Kenneth Stark Mobile Phone: [phone] Relation: Daughter  Code Status:  DNR Goals of care: Advanced Directive information Advanced Directives 08/12/2020  Does Patient Have a Medical Advance Directive? No  Type of Advance Directive -  Does patient want to make changes to medical advance directive? -  Copy of [place] in Chart? -  Would patient like information on creating a medical advance directive? No - Patient declined  Pre-existing out of facility DNR order (yellow form or pink MOST form) -     Chief Complaint  Patient presents with  . Medical Management of Chronic Issues    HPI:  Pt is a 86 y.o. male seen today for medical management of chronic diseases.     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 He resides in skilled care. He has been eating less and losing weight. Down 11 lbs since August. He has been working with [facility] and is currently on a D3 diet. He has a chronic cough but this is not worse per the staff. No fever or decreased 02 sats. He was in the ER on 08/12/20 due to difficulty placing a foley. He was found to have a renal stone in the left UP junction and referred to urology. They were not inclined to pursue  stent/lithotripsy due to his age and debility and that he was no symptoms with no change in his renal function. At this his cath is draining well and there are no reports of pain or retention. He has a wound to his foot. The nursing staff is using Santyl with the dressing changes.  Wt Readings from Last 3 Encounters:  08/30/20 147 lb 6.4 oz (66.9 kg)  07/21/20 154 lb 11.2 oz (70.2 kg)  05/25/20 158 lb 3.2 oz (71.8 kg)    Past Medical History:  Diagnosis Date  . Abnormality of gait   . Arthritis   . Brain bleed (Kenneth Stark) 09/01/2016  . Cervical spondylosis   . Controlled type 2 diabetes mellitus without complication, without long-term current use of insulin ([place]) 06/24/2019  . Degenerative joint disease (DJD) of lumbar spine   . Dementia ([place]) 04/28/2019  . Depression   . Diplopia   . Dyslipidemia   . Essential tremor   . Foul smelling urine   . Frequent falls   . GERD (gastroesophageal reflux disease)   . Glycosuria   . Hearing difficulty    hearing aids  . Hyperlipidemia   . Hypertension   . Hyperthyroidism   . Memory loss   . Osteoarthritis   . Radiculopathy, lumbar region   . 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 Small vessel disease ([place])   . TIA (transient ischemic attack)    Past Surgical History:  Procedure Laterality Date  . APPENDECTOMY  done as a child  . GALLBLADDER SURGERY  2008  . KNEE ARTHROSCOPY Left    Dr. Hart Stark 2002  . knee injections Right    Dr. Adriana Stark  . TONSILLECTOMY     done as a child    No Known Allergies  Outpatient Encounter Medications as of 08/27/2020  Medication Sig  . amLODipine (NORVASC) 10 MG tablet Take 1 tablet (10 mg total) by mouth daily.  Marland Kitchen ammonium lactate (AMLACTIN) 12 % cream Apply 1 Bottle topically at bedtime.   Marland Kitchen aspirin EC 81 MG tablet Take 81 mg by mouth daily.  . carbidopa-levodopa (SINEMET IR) 25-100 MG tablet Take 1.5 tablets by mouth 3 (three) times daily.  .  carboxymethylcellulose (REFRESH PLUS) 0.5 % SOLN Apply 2 drops to eye as needed (dry eyes).   . clopidogrel (PLAVIX) 75 MG tablet Take 75 mg by mouth daily.  . Cranberry 400 MG CAPS Take 2 capsules by mouth 3 (three) times daily.   Marland Kitchen FLAXSEED, LINSEED, PO Take 15 mLs by mouth.  . hydrALAZINE (APRESOLINE) 25 MG tablet Take 25 mg by mouth daily as needed. SBP>160  . ipratropium-albuterol (DUONEB) 0.5-2.5 (3) MG/3ML SOLN Take 3 mLs by nebulization every 8 (eight) hours as needed.  Marland Kitchen MELATONIN-CHAMOMILE PO Take by mouth at bedtime as needed.  . Memantine HCl-Donepezil HCl (NAMZARIC) 28-10 MG CP24 Take 1 capsule at bedtime by mouth.   . Menthol, Topical Analgesic, (BIOFREEZE) 4 % GEL Apply 1 application topically 2 (two) times daily as needed (pain).   . ondansetron (ZOFRAN) 4 MG tablet Take 4 mg by mouth every 8 (eight) hours as needed for nausea or vomiting.  . pantoprazole (PROTONIX) 40 MG tablet Take 1 tablet (40 mg total) by mouth daily.  . polyethylene glycol (MIRALAX / GLYCOLAX) packet Take 17 g by mouth every other day.  . senna-docusate (SENOKOT-S) 8.6-50 MG tablet Take 2 tablets 2 (two) times daily by mouth.   . sertraline (ZOLOFT) 50 MG tablet Take 50 mg by mouth at bedtime.  Marland Kitchen telmisartan (MICARDIS) 20 MG tablet Take 20 mg by mouth daily.  Marland Kitchen triamcinolone (NASACORT) 55 MCG/ACT AERO nasal inhaler Place 2 sprays into the nose at bedtime.   No facility-administered encounter medications on file as of 08/27/2020.    Review of Systems  Unable to perform ROS: Dementia    Immunization History  Administered Date(s) Administered  . Influenza Inj Mdck Quad Pf 08/09/2017  . Influenza, High Dose Seasonal PF 07/31/2016, 07/31/2019  . Influenza,inj,Quad PF,6+ Mos 08/06/2018  . Influenza-Unspecified 08/01/2010, 07/30/2012, 10/16/2012, 10/16/2013  . Moderna SARS-COVID-2 Vaccination 11/18/2019, 02/23/2020  . Pneumococcal Polysaccharide-23 02/23/2004, 02/21/2013  . Pneumococcal-Unspecified  03/17/2016  . Td 12/02/2008  . Tdap 09/01/2016  . Zoster 03/28/2006  . Zoster Recombinat (Shingrix) 10/29/2017, 01/24/2018   Pertinent  Health Maintenance Due  Topic Date Due  . INFLUENZA VACCINE  05/16/2020  . FOOT EXAM  10/30/2020  . HEMOGLOBIN A1C  01/24/2021  . PNA vac Low Risk Adult  Completed  . OPHTHALMOLOGY EXAM  Discontinued   Fall Risk  04/09/2020 09/06/2019 01/04/2019 07/17/2018 08/21/2017  Falls in the past year? 0 1 Exclusion - non ambulatory No No  Number falls in past yr: 0 0 - - -  Injury with Fall? 0 0 - - -  Risk for fall due to : Impaired balance/gait History of fall(s) - - -  Follow up Falls evaluation completed Falls evaluation completed - - -   Functional Status Survey:  Vitals:   08/30/20 0842  Weight: 147 lb 6.4 oz (66.9 kg)   Body mass index is 22.41 kg/m. Physical Exam Constitutional:      General: He is not in acute distress.    Appearance: He is not diaphoretic.     Comments: sleeping  HENT:     Head: Normocephalic and atraumatic.  Neck:     Thyroid: No thyromegaly.     Vascular: No JVD.     Trachea: No tracheal deviation.  Cardiovascular:     Rate and Rhythm: Normal rate and regular rhythm.     Heart sounds: No murmur heard.   Pulmonary:     Effort: Pulmonary effort is normal. No respiratory distress.     Breath sounds: Normal breath sounds. No wheezing.  Abdominal:     General: Bowel sounds are normal. There is no distension.     Palpations: Abdomen is soft.     Tenderness: There is no abdominal tenderness.  Musculoskeletal:     Right lower leg: No edema.     Left lower leg: No edema.  Lymphadenopathy:     Cervical: No cervical adenopathy.  Skin:    General: Skin is warm and dry.  Neurological:     General: No focal deficit present.     Mental Status: Mental status is at baseline.     Labs reviewed: Recent Labs    01/12/20 0000 01/12/20 0600 07/26/20 0000 08/12/20 2211  NA 141  --  144 140  K 4.0  --  4.0 3.5  CL  104  --  107 106  CO2 25*  --  23* 23  GLUCOSE  --   --   --  144*  BUN 20  --  18 21  CREATININE 0.9  --  0.7 0.84  CALCIUM  --  9.7 9.7 9.3   No results for input(s): AST, ALT, ALKPHOS, BILITOT, PROT, ALBUMIN in the last 8760 hours. Recent Labs    01/12/20 0000 08/12/20 2211  WBC 9.7 9.3  NEUTROABS  --  5.8  HGB 11.2* 11.0*  HCT 34* 33.9*  MCV  --  85.6  PLT 331 303   Lab Results  Component Value Date   TSH 3.14 01/18/2018   Lab Results  Component Value Date   HGBA1C 7.4 07/26/2020   Lab Results  Component Value Date   CHOL 127 05/11/2017   HDL 50 05/11/2017   LDLCALC 58 05/11/2017   TRIG 98 05/11/2017    Significant Diagnostic Results in last 30 days:  CT Renal Stone Study  Result Date: 08/12/2020 CLINICAL DATA:  Flank pain EXAM: CT ABDOMEN AND PELVIS WITHOUT CONTRAST TECHNIQUE: Multidetector CT imaging of the abdomen and pelvis was performed following the standard protocol without IV contrast. COMPARISON:  None. FINDINGS: LOWER CHEST: Normal. HEPATOBILIARY: Normal hepatic contours. No intra- or extrahepatic biliary dilatation. Status post cholecystectomy. PANCREAS: Normal pancreas. No ductal dilatation or peripancreatic fluid collection. SPLEEN: Normal. ADRENALS/URINARY TRACT: The adrenal glands are normal. 6 mm right lower pole renal calculus, nonobstructive. At the left ureteropelvic junction, there is an obstructive calculus measuring 17 mm. There is left renal pelvic dilatation. There are other nonobstructing left renal stones that measure to 12 mm. Mild left perinephric stranding. Decompressed by Foley catheter. STOMACH/BOWEL: There is no hiatal hernia. Normal duodenal course and caliber. No small bowel dilatation or inflammation. Rectosigmoid diverticulosis without acute inflammation. Normal appendix. VASCULAR/LYMPHATIC: There is calcific atherosclerosis of the abdominal aorta. No lymphadenopathy. REPRODUCTIVE: Enlarged prostate measures 5.9  cm in transverse  dimension. MUSCULOSKELETAL. No bony spinal canal stenosis or focal osseous abnormality. OTHER: None. IMPRESSION: 1. Left-sided obstructive uropathy with 17 mm calculus at the left ureteropelvic junction and mild left perinephric stranding. 2. Bilateral nonobstructing renal stones measuring up to 12 mm on the left and 6 mm on the right. 3.  Aortic atherosclerosis (ICD10-I70.0). Electronically Signed   By: Ulyses Jarred M.D.   On: 08/12/2020 22:00    Assessment/Plan 1. Obstruction of left ureteropelvic junction (UPJ) due to stone Monitor for fever or back pain Conservative management per urology   2. Urinary retention Due to BPH, continue cath/care and maintenance by [facility]  3. Oropharyngeal dysphagia Continue D3 diet  Asp prec 1:1 supervision   4. Weight loss Due to progressive dementia and dysphagia   5. Mixed Alzheimer's and vascular dementia ([place]) Progressing, he is sleeping more and requiring more assistance Goals of care are comfort based  Continues on Namenda prescribed by Neurology   6. Essential hypertension Controlled Continue Telmisartan 20 mg qd and Norvasc 10 mg qd       Labs/tests ordered:  NA

## 2020-09-01 DIAGNOSIS — M24542 Contracture, left hand: Secondary | ICD-10-CM | POA: Diagnosis not present

## 2020-09-01 DIAGNOSIS — M6389 Disorders of muscle in diseases classified elsewhere, multiple sites: Secondary | ICD-10-CM | POA: Diagnosis not present

## 2020-09-01 DIAGNOSIS — G20 Parkinson's disease: Secondary | ICD-10-CM | POA: Diagnosis not present

## 2020-09-01 DIAGNOSIS — R293 Abnormal posture: Secondary | ICD-10-CM | POA: Diagnosis not present

## 2020-09-03 DIAGNOSIS — G20 Parkinson's disease: Secondary | ICD-10-CM | POA: Diagnosis not present

## 2020-09-03 DIAGNOSIS — M6389 Disorders of muscle in diseases classified elsewhere, multiple sites: Secondary | ICD-10-CM | POA: Diagnosis not present

## 2020-09-03 DIAGNOSIS — M24542 Contracture, left hand: Secondary | ICD-10-CM | POA: Diagnosis not present

## 2020-09-03 DIAGNOSIS — R293 Abnormal posture: Secondary | ICD-10-CM | POA: Diagnosis not present

## 2020-09-08 DIAGNOSIS — M24542 Contracture, left hand: Secondary | ICD-10-CM | POA: Diagnosis not present

## 2020-09-08 DIAGNOSIS — G20 Parkinson's disease: Secondary | ICD-10-CM | POA: Diagnosis not present

## 2020-09-08 DIAGNOSIS — R293 Abnormal posture: Secondary | ICD-10-CM | POA: Diagnosis not present

## 2020-09-08 DIAGNOSIS — M6389 Disorders of muscle in diseases classified elsewhere, multiple sites: Secondary | ICD-10-CM | POA: Diagnosis not present

## 2020-09-10 DIAGNOSIS — R293 Abnormal posture: Secondary | ICD-10-CM | POA: Diagnosis not present

## 2020-09-10 DIAGNOSIS — M24542 Contracture, left hand: Secondary | ICD-10-CM | POA: Diagnosis not present

## 2020-09-10 DIAGNOSIS — G20 Parkinson's disease: Secondary | ICD-10-CM | POA: Diagnosis not present

## 2020-09-10 DIAGNOSIS — M6389 Disorders of muscle in diseases classified elsewhere, multiple sites: Secondary | ICD-10-CM | POA: Diagnosis not present

## 2020-09-13 DIAGNOSIS — M24542 Contracture, left hand: Secondary | ICD-10-CM | POA: Diagnosis not present

## 2020-09-13 DIAGNOSIS — G20 Parkinson's disease: Secondary | ICD-10-CM | POA: Diagnosis not present

## 2020-09-13 DIAGNOSIS — R293 Abnormal posture: Secondary | ICD-10-CM | POA: Diagnosis not present

## 2020-09-13 DIAGNOSIS — M6389 Disorders of muscle in diseases classified elsewhere, multiple sites: Secondary | ICD-10-CM | POA: Diagnosis not present

## 2020-09-15 DIAGNOSIS — R404 Transient alteration of awareness: Secondary | ICD-10-CM | POA: Diagnosis not present

## 2020-09-15 DIAGNOSIS — Z7401 Bed confinement status: Secondary | ICD-10-CM | POA: Diagnosis not present

## 2020-09-15 DIAGNOSIS — M2550 Pain in unspecified joint: Secondary | ICD-10-CM | POA: Diagnosis not present

## 2020-09-15 DIAGNOSIS — R3914 Feeling of incomplete bladder emptying: Secondary | ICD-10-CM | POA: Diagnosis not present

## 2020-09-15 DIAGNOSIS — Z743 Need for continuous supervision: Secondary | ICD-10-CM | POA: Diagnosis not present

## 2020-09-17 DIAGNOSIS — M24542 Contracture, left hand: Secondary | ICD-10-CM | POA: Diagnosis not present

## 2020-09-17 DIAGNOSIS — M6389 Disorders of muscle in diseases classified elsewhere, multiple sites: Secondary | ICD-10-CM | POA: Diagnosis not present

## 2020-09-17 DIAGNOSIS — R293 Abnormal posture: Secondary | ICD-10-CM | POA: Diagnosis not present

## 2020-09-17 DIAGNOSIS — G20 Parkinson's disease: Secondary | ICD-10-CM | POA: Diagnosis not present

## 2020-09-20 DIAGNOSIS — M6389 Disorders of muscle in diseases classified elsewhere, multiple sites: Secondary | ICD-10-CM | POA: Diagnosis not present

## 2020-09-20 DIAGNOSIS — R293 Abnormal posture: Secondary | ICD-10-CM | POA: Diagnosis not present

## 2020-09-20 DIAGNOSIS — G20 Parkinson's disease: Secondary | ICD-10-CM | POA: Diagnosis not present

## 2020-09-20 DIAGNOSIS — M24542 Contracture, left hand: Secondary | ICD-10-CM | POA: Diagnosis not present

## 2020-09-22 DIAGNOSIS — R293 Abnormal posture: Secondary | ICD-10-CM | POA: Diagnosis not present

## 2020-09-22 DIAGNOSIS — M6389 Disorders of muscle in diseases classified elsewhere, multiple sites: Secondary | ICD-10-CM | POA: Diagnosis not present

## 2020-09-22 DIAGNOSIS — G20 Parkinson's disease: Secondary | ICD-10-CM | POA: Diagnosis not present

## 2020-09-22 DIAGNOSIS — M24542 Contracture, left hand: Secondary | ICD-10-CM | POA: Diagnosis not present

## 2020-09-24 DIAGNOSIS — G20 Parkinson's disease: Secondary | ICD-10-CM | POA: Diagnosis not present

## 2020-09-24 DIAGNOSIS — M6389 Disorders of muscle in diseases classified elsewhere, multiple sites: Secondary | ICD-10-CM | POA: Diagnosis not present

## 2020-09-24 DIAGNOSIS — R293 Abnormal posture: Secondary | ICD-10-CM | POA: Diagnosis not present

## 2020-09-24 DIAGNOSIS — M24542 Contracture, left hand: Secondary | ICD-10-CM | POA: Diagnosis not present

## 2020-09-27 DIAGNOSIS — G20 Parkinson's disease: Secondary | ICD-10-CM | POA: Diagnosis not present

## 2020-09-27 DIAGNOSIS — M6389 Disorders of muscle in diseases classified elsewhere, multiple sites: Secondary | ICD-10-CM | POA: Diagnosis not present

## 2020-09-27 DIAGNOSIS — M24542 Contracture, left hand: Secondary | ICD-10-CM | POA: Diagnosis not present

## 2020-09-27 DIAGNOSIS — R293 Abnormal posture: Secondary | ICD-10-CM | POA: Diagnosis not present

## 2020-09-29 DIAGNOSIS — R293 Abnormal posture: Secondary | ICD-10-CM | POA: Diagnosis not present

## 2020-09-29 DIAGNOSIS — M24542 Contracture, left hand: Secondary | ICD-10-CM | POA: Diagnosis not present

## 2020-09-29 DIAGNOSIS — M6389 Disorders of muscle in diseases classified elsewhere, multiple sites: Secondary | ICD-10-CM | POA: Diagnosis not present

## 2020-09-29 DIAGNOSIS — G20 Parkinson's disease: Secondary | ICD-10-CM | POA: Diagnosis not present

## 2020-10-01 DIAGNOSIS — M24542 Contracture, left hand: Secondary | ICD-10-CM | POA: Diagnosis not present

## 2020-10-01 DIAGNOSIS — R293 Abnormal posture: Secondary | ICD-10-CM | POA: Diagnosis not present

## 2020-10-01 DIAGNOSIS — G20 Parkinson's disease: Secondary | ICD-10-CM | POA: Diagnosis not present

## 2020-10-01 DIAGNOSIS — M6389 Disorders of muscle in diseases classified elsewhere, multiple sites: Secondary | ICD-10-CM | POA: Diagnosis not present

## 2020-10-12 DIAGNOSIS — M2550 Pain in unspecified joint: Secondary | ICD-10-CM | POA: Diagnosis not present

## 2020-10-12 DIAGNOSIS — Z7401 Bed confinement status: Secondary | ICD-10-CM | POA: Diagnosis not present

## 2020-10-12 DIAGNOSIS — R5381 Other malaise: Secondary | ICD-10-CM | POA: Diagnosis not present

## 2020-10-12 DIAGNOSIS — R404 Transient alteration of awareness: Secondary | ICD-10-CM | POA: Diagnosis not present

## 2020-10-12 DIAGNOSIS — Z743 Need for continuous supervision: Secondary | ICD-10-CM | POA: Diagnosis not present

## 2020-10-22 ENCOUNTER — Non-Acute Institutional Stay (SKILLED_NURSING_FACILITY): Payer: Medicare Other | Admitting: Adult Health

## 2020-10-22 ENCOUNTER — Encounter: Payer: Self-pay | Admitting: Adult Health

## 2020-10-22 DIAGNOSIS — N201 Calculus of ureter: Secondary | ICD-10-CM | POA: Diagnosis not present

## 2020-10-22 DIAGNOSIS — R634 Abnormal weight loss: Secondary | ICD-10-CM

## 2020-10-22 DIAGNOSIS — M24542 Contracture, left hand: Secondary | ICD-10-CM

## 2020-10-22 DIAGNOSIS — F0280 Dementia in other diseases classified elsewhere without behavioral disturbance: Secondary | ICD-10-CM

## 2020-10-22 DIAGNOSIS — F0150 Vascular dementia without behavioral disturbance: Secondary | ICD-10-CM

## 2020-10-22 DIAGNOSIS — R339 Retention of urine, unspecified: Secondary | ICD-10-CM

## 2020-10-22 DIAGNOSIS — G309 Alzheimer's disease, unspecified: Secondary | ICD-10-CM

## 2020-10-22 DIAGNOSIS — K5901 Slow transit constipation: Secondary | ICD-10-CM | POA: Diagnosis not present

## 2020-10-22 NOTE — Progress Notes (Signed)
Location:  Occupational psychologist of Service:  SNF (31) Provider:   Cindi Carbon, ANP [place] [phone]   [name], DO  Patient Care Team: [name], DO as PCP - General (Geriatric Medicine) Danella Sensing, MD as Consulting Physician (Dermatology) Sharyne Peach, MD as Consulting Physician (Ophthalmology) Irine Seal, MD as Attending Physician (Urology) [name], MD as Consulting Physician (Neurology)  Extended Emergency Contact Information Primary Emergency Contact: Laning,[name] Address: [address]          [place] [address] [name] of [place] Phone: [phone] Relation: Spouse Secondary Emergency Contact: [place] Mobile Phone: [phone] Relation: Daughter  Code Status:  DNR Goals of care: Advanced Directive information Advanced Directives 08/12/2020  Does Patient Have a Medical Advance Directive? No  Type of Advance Directive -  Does patient want to make changes to medical advance directive? -  Copy of [place] in Chart? -  Would patient like information on creating a medical advance directive? No - Patient declined  Pre-existing out of facility DNR order (yellow form or pink MOST form) -     Chief Complaint  Patient presents with  . Medical Management of Chronic Issues    HPI:  Pt is a 87 y.o. male seen today for medical management of chronic diseases.     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 He has continues to progressively lose weight over the past several months. Down 4 lbs from last month. Tends to sleep more. No issues with behaviors.   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 The CNA places a washcloth to his hand at times to help prevent his nails from pushing into his hands.   He was in the ER on 08/12/20 due to difficulty placing a foley. He was found to have a renal stone in the left UP junction  and referred to urology. They opted not to pursue treatment due to his age and dementia. He has not had any further issues with back pain or fever.   Wt Readings from Last 3 Encounters:  10/22/20 143 lb 11.2 oz (65.2 kg)  08/30/20 147 lb 6.4 oz (66.9 kg)  07/21/20 154 lb 11.2 oz (70.2 kg)    Past Medical History:  Diagnosis Date  . Abnormality of gait   . Arthritis   . Brain bleed ([place]) 09/01/2016  . Cervical spondylosis   . Controlled type 2 diabetes mellitus without complication, without long-term current use of insulin ([place]) 06/24/2019  . Degenerative joint disease (DJD) of lumbar spine   . Dementia ([place]) 04/28/2019  . Depression   . Diplopia   . Dyslipidemia   . Essential tremor   . Foul smelling urine   . Frequent falls   . GERD (gastroesophageal reflux disease)   . Glycosuria   . Hearing difficulty    hearing aids  . Hyperlipidemia   . Hypertension   . Hyperthyroidism   . Memory loss   . Osteoarthritis   . Radiculopathy, lumbar region   . 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 Small vessel disease ([place])   . TIA (transient ischemic attack)    Past Surgical History:  Procedure Laterality Date  . APPENDECTOMY     done as a child  . GALLBLADDER SURGERY  2008  . KNEE ARTHROSCOPY Left    Dr. Hart Robinsons 2002  .  knee injections Right    Dr. Adriana Mccallum  . TONSILLECTOMY     done as a child    No Known Allergies  Outpatient Encounter Medications as of 10/22/2020  Medication Sig  . Ophthalmic Irrigation Solution (OCUSOFT EYE [place] OP) Apply 1 application to eye daily.  Marland Kitchen amLODipine (NORVASC) 10 MG tablet Take 1 tablet (10 mg total) by mouth daily.  Marland Kitchen ammonium lactate (AMLACTIN) 12 % cream Apply 1 Bottle topically at bedtime.   Marland Kitchen aspirin EC 81 MG tablet Take 81 mg by mouth daily.  . carbidopa-levodopa (SINEMET IR) 25-100 MG tablet Take 1.5 tablets by mouth 3 (three) times daily.  . carboxymethylcellulose (REFRESH PLUS) 0.5 % SOLN Apply 2  drops to eye as needed (dry eyes).   . clopidogrel (PLAVIX) 75 MG tablet Take 75 mg by mouth daily.  . Cranberry 400 MG CAPS Take 2 capsules by mouth 3 (three) times daily.   Marland Kitchen FLAXSEED, LINSEED, PO Take 15 mLs by mouth.  . hydrALAZINE (APRESOLINE) 25 MG tablet Take 25 mg by mouth daily as needed. SBP>160  . ipratropium-albuterol (DUONEB) 0.5-2.5 (3) MG/3ML SOLN Take 3 mLs by nebulization every 8 (eight) hours as needed.  Marland Kitchen MELATONIN-CHAMOMILE PO Take by mouth at bedtime as needed.  . Memantine HCl-Donepezil HCl (NAMZARIC) 28-10 MG CP24 Take 1 capsule at bedtime by mouth.   . Menthol, Topical Analgesic, (BIOFREEZE) 4 % GEL Apply 1 application topically 2 (two) times daily as needed (pain).   . ondansetron (ZOFRAN) 4 MG tablet Take 4 mg by mouth every 8 (eight) hours as needed for nausea or vomiting.  . pantoprazole (PROTONIX) 40 MG tablet Take 1 tablet (40 mg total) by mouth daily.  . polyethylene glycol (MIRALAX / GLYCOLAX) packet Take 17 g by mouth every other day.  . senna-docusate (SENOKOT-S) 8.6-50 MG tablet Take 2 tablets 2 (two) times daily by mouth.   . sertraline (ZOLOFT) 50 MG tablet Take 50 mg by mouth at bedtime.  Marland Kitchen telmisartan (MICARDIS) 20 MG tablet Take 20 mg by mouth daily.  Marland Kitchen triamcinolone (NASACORT) 55 MCG/ACT AERO nasal inhaler Place 2 sprays into the nose at bedtime.   No facility-administered encounter medications on file as of 10/22/2020.    Review of Systems  Unable to perform ROS: Dementia    Immunization History  Administered Date(s) Administered  . Influenza Inj Mdck Quad Pf 08/09/2017  . Influenza, High Dose Seasonal PF 07/31/2016, 07/31/2019  . Influenza,inj,Quad PF,6+ Mos 08/06/2018  . Influenza-Unspecified 08/01/2010, 07/30/2012, 10/16/2012, 10/16/2013, 08/06/2020  . Moderna Sars-Covid-2 Vaccination 11/18/2019, 02/23/2020  . Pneumococcal Polysaccharide-23 02/23/2004, 02/21/2013  . Pneumococcal-Unspecified 03/17/2016  . Td 12/02/2008  . Tdap 09/01/2016  .  Zoster 03/28/2006  . Zoster Recombinat (Shingrix) 10/29/2017, 01/24/2018   Pertinent  Health Maintenance Due  Topic Date Due  . FOOT EXAM  10/30/2020  . HEMOGLOBIN A1C  01/24/2021  . INFLUENZA VACCINE  Completed  . PNA vac Low Risk Adult  Completed  . OPHTHALMOLOGY EXAM  Discontinued   Fall Risk  04/09/2020 09/06/2019 01/04/2019 07/17/2018 08/21/2017  Falls in the past year? 0 1 Exclusion - non ambulatory No No  Number falls in past yr: 0 0 - - -  Injury with Fall? 0 0 - - -  Risk for fall due to : Impaired balance/gait History of fall(s) - - -  Follow up Falls evaluation completed Falls evaluation completed - - -   Functional Status Survey:    Vitals:   10/22/20 1608  Weight: 143  lb 11.2 oz (65.2 kg)   Body mass index is 21.85 kg/m.  Wt Readings from Last 3 Encounters:  10/22/20 143 lb 11.2 oz (65.2 kg)  08/30/20 147 lb 6.4 oz (66.9 kg)  07/21/20 154 lb 11.2 oz (70.2 kg)    Physical Exam Constitutional:      General: He is not in acute distress.    Appearance: He is not diaphoretic.     Comments: eyes closed but arouses  HENT:     Head: Normocephalic and atraumatic.  Neck:     Thyroid: No thyromegaly.     Vascular: No JVD.     Trachea: No tracheal deviation.  Cardiovascular:     Rate and Rhythm: Normal rate and regular rhythm.     Heart sounds: No murmur heard.   Pulmonary:     Effort: Pulmonary effort is normal. No respiratory distress.     Breath sounds: Normal breath sounds. No wheezing.  Abdominal:     General: Bowel sounds are normal. There is no distension.     Palpations: Abdomen is soft.     Tenderness: There is no abdominal tenderness.  Musculoskeletal:     Right lower leg: No edema.     Left lower leg: No edema.  Lymphadenopathy:     Cervical: No cervical adenopathy.  Skin:    General: Skin is warm and dry.  Neurological:     General: No focal deficit present.     Mental Status: Mental status is at baseline.     Labs reviewed: Recent Labs     01/12/20 0000 01/12/20 0600 07/26/20 0000 08/12/20 2211  NA 141  --  144 140  K 4.0  --  4.0 3.5  CL 104  --  107 106  CO2 25*  --  23* 23  GLUCOSE  --   --   --  144*  BUN 20  --  18 21  CREATININE 0.9  --  0.7 0.84  CALCIUM  --  9.7 9.7 9.3   No results for input(s): AST, ALT, ALKPHOS, BILITOT, PROT, ALBUMIN in the last 8760 hours. Recent Labs    01/12/20 0000 08/12/20 2211  WBC 9.7 9.3  NEUTROABS  --  5.8  HGB 11.2* 11.0*  HCT 34* 33.9*  MCV  --  85.6  PLT 331 303   Lab Results  Component Value Date   TSH 3.14 01/18/2018   Lab Results  Component Value Date   HGBA1C 7.4 07/26/2020   Lab Results  Component Value Date   CHOL 127 05/11/2017   HDL 50 05/11/2017   LDLCALC 58 05/11/2017   TRIG 98 05/11/2017    Significant Diagnostic Results in last 30 days:  No results found.  Assessment/Plan 1. Obstruction of left ureteropelvic junction (UPJ) due to stone Monitor for fever or back pain Conservative management per urology   2. Urinary retention Due to BPH, continue cath/care and maintenance by [facility]  3. Mixed dementia MMSE 8/30  Progressing over the past year.  Continues on Namzaric Continues to benefit from the skilled environment.   4. Weight loss Due to cognitive decline and dysphagia Goals of care are comfort based with DNR in place  5. Left hand contracture  He can wear a brace or use a washcloth for comfort Recommended to the CNA to trim his nails  6. Constipation Controlled, continue miralax and senokot s  Labs/tests ordered:  NA

## 2020-11-17 DIAGNOSIS — R338 Other retention of urine: Secondary | ICD-10-CM | POA: Diagnosis not present

## 2020-11-17 DIAGNOSIS — R410 Disorientation, unspecified: Secondary | ICD-10-CM | POA: Diagnosis not present

## 2020-11-17 DIAGNOSIS — N200 Calculus of kidney: Secondary | ICD-10-CM | POA: Diagnosis not present

## 2020-11-17 DIAGNOSIS — Z743 Need for continuous supervision: Secondary | ICD-10-CM | POA: Diagnosis not present

## 2020-11-17 DIAGNOSIS — R531 Weakness: Secondary | ICD-10-CM | POA: Diagnosis not present

## 2020-11-17 DIAGNOSIS — Z7401 Bed confinement status: Secondary | ICD-10-CM | POA: Diagnosis not present

## 2020-11-17 DIAGNOSIS — M2550 Pain in unspecified joint: Secondary | ICD-10-CM | POA: Diagnosis not present

## 2020-11-22 ENCOUNTER — Non-Acute Institutional Stay (SKILLED_NURSING_FACILITY): Payer: Medicare Other | Admitting: Adult Health

## 2020-11-22 ENCOUNTER — Encounter: Payer: Self-pay | Admitting: Adult Health

## 2020-11-22 DIAGNOSIS — F0150 Vascular dementia without behavioral disturbance: Secondary | ICD-10-CM

## 2020-11-22 DIAGNOSIS — Z66 Do not resuscitate: Secondary | ICD-10-CM | POA: Diagnosis not present

## 2020-11-22 DIAGNOSIS — L89890 Pressure ulcer of other site, unstageable: Secondary | ICD-10-CM | POA: Diagnosis not present

## 2020-11-22 DIAGNOSIS — G214 Vascular parkinsonism: Secondary | ICD-10-CM | POA: Diagnosis not present

## 2020-11-22 DIAGNOSIS — G309 Alzheimer's disease, unspecified: Secondary | ICD-10-CM

## 2020-11-22 DIAGNOSIS — E119 Type 2 diabetes mellitus without complications: Secondary | ICD-10-CM | POA: Diagnosis not present

## 2020-11-22 DIAGNOSIS — R634 Abnormal weight loss: Secondary | ICD-10-CM | POA: Diagnosis not present

## 2020-11-22 DIAGNOSIS — R1312 Dysphagia, oropharyngeal phase: Secondary | ICD-10-CM

## 2020-11-22 DIAGNOSIS — F0280 Dementia in other diseases classified elsewhere without behavioral disturbance: Secondary | ICD-10-CM

## 2020-11-22 NOTE — Progress Notes (Signed)
Location:   [facility] Room Number: 112-A Place of Service:  SNF [phone]) Provider:  Royal Hawthorn, NP    Patient Care Team: [name], DO as PCP - General (Geriatric Medicine) Danella Sensing, MD as Consulting Physician (Dermatology) Sharyne Peach, MD as Consulting Physician (Ophthalmology) Irine Seal, MD as Attending Physician (Urology) [name], MD as Consulting Physician (Neurology)  Extended Emergency Contact Information Primary Emergency Contact: Flemmer,[name] Address: [address]          [place] [address] [name] of [place] Phone: [phone] Relation: Spouse Secondary Emergency Contact: Rogers of [place] Mobile Phone: [phone] Relation: Daughter  Code Status:  DNR Goals of care: Advanced Directive information Advanced Directives 11/22/2020  Does Patient Have a Medical Advance Directive? Yes  Type of Paramedic of [place];Living will;Out of facility DNR (pink MOST or yellow form)  Does patient want to make changes to medical advance directive? No - Patient declined  Copy of Kenneth Stark in Chart? Yes - validated most recent copy scanned in chart (See row information)  Would patient like information on creating a medical advance directive? -  Pre-existing out of facility DNR order (yellow form or pink MOST form) -     Chief Complaint  Patient presents with  . Acute Visit    Weight Loss.    HPI:  Pt is a 87 y.o. male seen today for an acute visit for weight loss. Mr. Kenneth Stark has a hx of mixed Alz/vascular dementia and resides in skilled care. He has been eating less, sleeping more, and closing his mouth for food and meds. He has lost 16 lbs in the past three months. He has a hx of dysphagia and is on a D3 diet. He has occasional periods of cough but is overall tolerating the consistency of the diet well. He is not having any dental  issues or dental pain. No diarrhea, constipation, or abd pain. He is increasingly more confused with delusional thoughts per his wife. He also has a pressure injury to his foot that is healing. In addition, he is more rigidity and has issues with contractures to both hands, worse on the right.   Wt Readings from Last 3 Encounters:  11/22/20 131 lb 1.6 oz (59.5 kg)  10/22/20 143 lb 11.2 oz (65.2 kg)  08/30/20 147 lb 6.4 oz (66.9 kg)    Past Medical History:  Diagnosis Date  . Abnormality of gait   . Arthritis   . Brain bleed ([place]) 09/01/2016  . Cervical spondylosis   . Controlled type 2 diabetes mellitus without complication, without long-term current use of insulin (Kenneth Stark) 06/24/2019  . Degenerative joint disease (DJD) of lumbar spine   . Dementia (Kenneth Stark) 04/28/2019  . Depression   . Diplopia   . Dyslipidemia   . Essential tremor   . Foul smelling urine   . Frequent falls   . GERD (gastroesophageal reflux disease)   . Glycosuria   . Hearing difficulty    hearing aids  . Hyperlipidemia   . Hypertension   . Hyperthyroidism   . Memory loss   . Osteoarthritis   . Radiculopathy, lumbar region   . 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 Small vessel disease ([place])   . TIA (transient ischemic attack)    Past Surgical History:  Procedure Laterality Date  . APPENDECTOMY     done as a child  .  GALLBLADDER SURGERY  2008  . KNEE ARTHROSCOPY Left    Dr. Hart Robinsons 2002  . knee injections Right    Dr. Adriana Mccallum  . TONSILLECTOMY     done as a child    No Known Allergies  Allergies as of 11/22/2020   No Known Allergies     Medication List       Accurate as of November 22, 2020 11:22 AM. If you have any questions, ask your nurse or doctor.        STOP taking these medications   FLAXSEED (LINSEED) PO Stopped by: Royal Hawthorn, NP     TAKE these medications   amLODipine 10 MG tablet Commonly known as: NORVASC Take 1 tablet (10 mg total) by mouth  daily.   ammonium lactate 12 % cream Commonly known as: AMLACTIN Apply 1 Bottle topically at bedtime.   aspirin EC 81 MG tablet Take 81 mg by mouth daily.   carbidopa-levodopa 25-100 MG tablet Commonly known as: SINEMET IR Take 1.5 tablets by mouth 3 (three) times daily.   carboxymethylcellulose 0.5 % Soln Commonly known as: REFRESH PLUS Apply 2 drops to eye as needed (dry eyes).   clopidogrel 75 MG tablet Commonly known as: PLAVIX Take 75 mg by mouth daily.   Cranberry 400 MG Caps Take 2 capsules by mouth 3 (three) times daily.   hydrALAZINE 25 MG tablet Commonly known as: APRESOLINE Take 25 mg by mouth daily as needed. SBP>160   ipratropium-albuterol 0.5-2.5 (3) MG/3ML Soln Commonly known as: DUONEB Take 3 mLs by nebulization every 8 (eight) hours as needed.   MELATONIN-CHAMOMILE PO Take by mouth at bedtime as needed.   Memantine HCl-Donepezil HCl 28-10 MG Cp24 Take 1 capsule at bedtime by mouth.   Menthol (Topical Analgesic) 4 % Gel Apply 1 application topically 2 (two) times daily as needed (pain).   OCUSOFT EYE [place] OP Apply 1 application to eye daily.   ondansetron 4 MG tablet Commonly known as: ZOFRAN Take 4 mg by mouth every 8 (eight) hours as needed for nausea or vomiting.   pantoprazole 40 MG tablet Commonly known as: Protonix Take 1 tablet (40 mg total) by mouth daily.   polyethylene glycol 17 g packet Commonly known as: MIRALAX / GLYCOLAX Take 17 g by mouth every other day.   senna-docusate 8.6-50 MG tablet Commonly known as: Senokot-S Take 2 tablets 2 (two) times daily by mouth.   sertraline 50 MG tablet Commonly known as: ZOLOFT Take 50 mg by mouth at bedtime.   telmisartan 20 MG tablet Commonly known as: MICARDIS Take 20 mg by mouth daily.   triamcinolone 55 MCG/ACT Aero nasal inhaler Commonly known as: NASACORT Place 2 sprays into the nose at bedtime.       Review of Systems  Unable to perform ROS: Dementia    Immunization  History  Administered Date(s) Administered  . Influenza Inj Mdck Quad Pf 08/09/2017  . Influenza, High Dose Seasonal PF 07/31/2016, 07/31/2019  . Influenza,inj,Quad PF,6+ Mos 08/06/2018  . Influenza-Unspecified 08/01/2010, 07/30/2012, 10/16/2012, 10/16/2013, 08/06/2020  . Moderna Sars-Covid-2 Vaccination 11/18/2019, 02/23/2020, 08/26/2020  . Pneumococcal Polysaccharide-23 02/23/2004, 02/21/2013  . Pneumococcal-Unspecified 03/17/2016  . Td 12/02/2008  . Tdap 09/01/2016  . Zoster 03/28/2006  . Zoster Recombinat (Shingrix) 10/29/2017, 01/24/2018   Pertinent  Health Maintenance Due  Topic Date Due  . FOOT EXAM  10/30/2020  . HEMOGLOBIN A1C  01/24/2021  . INFLUENZA VACCINE  Completed  . PNA vac Low Risk Adult  Completed  .  OPHTHALMOLOGY EXAM  Discontinued   Fall Risk  04/09/2020 09/06/2019 01/04/2019 07/17/2018 08/21/2017  Falls in the past year? 0 1 Exclusion - non ambulatory No No  Number falls in past yr: 0 0 - - -  Injury with Fall? 0 0 - - -  Risk for fall due to : Impaired balance/gait History of fall(s) - - -  Follow up Falls evaluation completed Falls evaluation completed - - -   Functional Status Survey:    Vitals:   11/22/20 1114  BP: 129/62  Pulse: 65  Resp: 15  Temp: (!) 97.5 F (36.4 C)  SpO2: 96%  Weight: 131 lb 1.6 oz (59.5 kg)  Height: 5\' 8"  (1.727 m)   Body mass index is 19.93 kg/m. Physical Exam Vitals and nursing note reviewed.  Constitutional:      General: He is not in acute distress.    Appearance: He is not diaphoretic.     Comments: Eyes closed but easily arousable  HENT:     Head: Normocephalic and atraumatic.     Mouth/Throat:     Mouth: Mucous membranes are moist.     Pharynx: Oropharynx is clear. No oropharyngeal exudate.  Eyes:     Conjunctiva/sclera: Conjunctivae normal.     Pupils: Pupils are equal, round, and reactive to light.  Neck:     Thyroid: No thyromegaly.     Vascular: No JVD.     Trachea: No tracheal deviation.   Cardiovascular:     Rate and Rhythm: Normal rate and regular rhythm.     Heart sounds: No murmur heard.   Pulmonary:     Effort: Pulmonary effort is normal. No respiratory distress.     Breath sounds: Normal breath sounds. No wheezing.  Abdominal:     General: Bowel sounds are normal. There is no distension.     Palpations: Abdomen is soft.     Tenderness: There is no abdominal tenderness.  Genitourinary:    Comments: Foley with clear yellow urine Musculoskeletal:     Right lower leg: No edema.     Left lower leg: No edema.     Comments: Decreased ROM to BUE including bilateral elbows and hands.   Lymphadenopathy:     Cervical: No cervical adenopathy.  Skin:    General: Skin is warm and dry.     Comments: Right foot dorsum with healing area of pressure. No redness, swelling, or drainage.   Neurological:     Comments: Oriented to self only. No focal deficit. Difficulty following commands.   Psychiatric:        Mood and Affect: Mood and affect normal.     Comments: Flat, furrowed brow     Labs reviewed: Recent Labs    01/12/20 0000 01/12/20 0600 07/26/20 0000 08/12/20 2211  NA 141  --  144 140  K 4.0  --  4.0 3.5  CL 104  --  107 106  CO2 25*  --  23* 23  GLUCOSE  --   --   --  144*  BUN 20  --  18 21  CREATININE 0.9  --  0.7 0.84  CALCIUM  --  9.7 9.7 9.3   No results for input(s): AST, ALT, ALKPHOS, BILITOT, PROT, ALBUMIN in the last 8760 hours. Recent Labs    01/12/20 0000 08/12/20 2211  WBC 9.7 9.3  NEUTROABS  --  5.8  HGB 11.2* 11.0*  HCT 34* 33.9*  MCV  --  85.6  PLT 331 303  Lab Results  Component Value Date   TSH 3.14 01/18/2018   Lab Results  Component Value Date   HGBA1C 7.4 07/26/2020   Lab Results  Component Value Date   CHOL 127 05/11/2017   HDL 50 05/11/2017   LDLCALC 58 05/11/2017   TRIG 98 05/11/2017    Significant Diagnostic Results in last 30 days:  No results found.  Assessment/Plan  1. Weight loss Due to progressive  dementia Continue glucerna and calorie dense options   2. Mixed Alzheimer's and vascular dementia ([place]) Severe stage at this point and not likely benefiting from Namzaric  Discussed with his wife and we will taper this med due to lack of benefit and weight loss.   3. Oropharyngeal dysphagia Continue D3 diet Asp prec 1:1 supervision  4. Pressure injury of right foot, unstageable ([place]) Improved Dressing changes per [facility] staff  5. 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 DNR order updated in epic I called and discussed Mr. Reish weight loss and decline with his wife [name]. She is aware of the weight loss and that this is a common finding toward the end of life with dementia. Her goals is to keep him comfortable here at [facility] and avoid heroic measures and hospitalizations. He is a DNR. We thought we had a most form filled out for him indicating these wishes but if we do not she will fill one out. Discussed this issue with his nurse Misti.   Family/ staff Communication: discussed with his wife Tilmon Bosarge.   Labs/tests ordered:  NA

## 2020-11-23 ENCOUNTER — Encounter: Payer: Self-pay | Admitting: Adult Health

## 2020-12-06 ENCOUNTER — Encounter: Payer: Self-pay | Admitting: Internal Medicine

## 2020-12-15 DIAGNOSIS — M2550 Pain in unspecified joint: Secondary | ICD-10-CM | POA: Diagnosis not present

## 2020-12-15 DIAGNOSIS — Z743 Need for continuous supervision: Secondary | ICD-10-CM | POA: Diagnosis not present

## 2020-12-15 DIAGNOSIS — R3914 Feeling of incomplete bladder emptying: Secondary | ICD-10-CM | POA: Diagnosis not present

## 2020-12-15 DIAGNOSIS — Z7401 Bed confinement status: Secondary | ICD-10-CM | POA: Diagnosis not present

## 2020-12-15 DIAGNOSIS — R6889 Other general symptoms and signs: Secondary | ICD-10-CM | POA: Diagnosis not present

## 2020-12-15 DIAGNOSIS — R5381 Other malaise: Secondary | ICD-10-CM | POA: Diagnosis not present

## 2020-12-17 ENCOUNTER — Encounter: Payer: Self-pay | Admitting: Adult Health

## 2020-12-17 DIAGNOSIS — R338 Other retention of urine: Secondary | ICD-10-CM | POA: Diagnosis not present

## 2020-12-17 NOTE — Progress Notes (Signed)
Location:  [facility] Room Number: 112-A Place of Service:  SNF [phone]) Provider:  Royal Hawthorn, NP    Patient Care Team: [name], DO as PCP - General (Geriatric Medicine) Danella Sensing, MD as Consulting Physician (Dermatology) Sharyne Peach, MD as Consulting Physician (Ophthalmology) Irine Seal, MD as Attending Physician (Urology) [name], MD as Consulting Physician (Neurology)  Extended Emergency Contact Information Primary Emergency Contact: Siguenza,[name] Address: [address]          [place] [address] [name] of [place] Phone: [phone] Relation: Spouse Secondary Emergency Contact: Lilly of [place] Mobile Phone: [phone] Relation: Daughter  Code Status:  DNR  Goals of care: Advanced Directive information Advanced Directives 12/17/2020  Does Patient Have a Medical Advance Directive? Yes  Type of Paramedic of [place];Living will;Out of facility DNR (pink MOST or yellow form)  Does patient want to make changes to medical advance directive? No - Patient declined  Copy of [place] in Chart? Yes - validated most recent copy scanned in chart (See row information)  Would patient like information on creating a medical advance directive? -  Pre-existing out of facility DNR order (yellow form or pink MOST form) Yellow form placed in chart (order not valid for inpatient use)     Chief Complaint  Patient presents with  . Medical Management of Chronic Issues    Routine visit     HPI:  Pt is a 87 y.o. Stark seen today for medical management of chronic diseases.     Past Medical History:  Diagnosis Date  . Abnormality of gait   . Arthritis   . Brain bleed ([place]) 09/01/2016  . Cervical spondylosis   . Controlled type 2 diabetes mellitus without complication, without long-term current use of insulin ([place]) 06/24/2019  . Degenerative  joint disease (DJD) of lumbar spine   . Dementia ([place]) 04/28/2019  . Depression   . Diplopia   . Dyslipidemia   . Essential tremor   . Foul smelling urine   . Frequent falls   . GERD (gastroesophageal reflux disease)   . Glycosuria   . Hearing difficulty    hearing aids  . Hyperlipidemia   . Hypertension   . Hyperthyroidism   . Memory loss   . Osteoarthritis   . Radiculopathy, lumbar region   . 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 Small vessel disease ([place])   . TIA (transient ischemic attack)    Past Surgical History:  Procedure Laterality Date  . APPENDECTOMY     done as a child  . GALLBLADDER SURGERY  2008  . KNEE ARTHROSCOPY Left    Dr. Hart Robinsons 2002  . knee injections Right    Dr. Adriana Mccallum  . TONSILLECTOMY     done as a child    No Known Allergies  Outpatient Encounter Medications as of 12/17/2020  Medication Sig  . amLODipine (NORVASC) 10 MG tablet Take 1 tablet (10 mg total) by mouth daily.  Marland Kitchen ammonium lactate (AMLACTIN) 12 % cream Apply 1 Bottle topically at bedtime.  Marland Kitchen aspirin EC 81 MG tablet Take 81 mg by mouth daily.  . carbidopa-levodopa (SINEMET IR) 25-100 MG tablet Take 1.5 tablets by mouth 3 (three) times daily.  . carboxymethylcellulose (REFRESH PLUS) 0.5 % SOLN Apply 2 drops to eye every 4 (four) hours as needed (dry eyes).  . clopidogrel (PLAVIX) 75  MG tablet Take 75 mg by mouth daily.  . Cranberry 400 MG CAPS Take 2 capsules by mouth 3 (three) times daily.   . Glucerna (GLUCERNA) LIQD Take 237 mLs by mouth.  . hydrALAZINE (APRESOLINE) 25 MG tablet Take 25 mg by mouth daily as needed. SBP>160  . ipratropium-albuterol (DUONEB) 0.5-2.5 (3) MG/3ML SOLN Take 3 mLs by nebulization every 8 (eight) hours as needed.  Marland Kitchen MELATONIN-CHAMOMILE PO Take by mouth at bedtime as needed.  . Menthol, Topical Analgesic, 4 % GEL Apply 1 application topically 2 (two) times daily as needed (pain).   . ondansetron (ZOFRAN) 4 MG tablet Take 4  mg by mouth every 8 (eight) hours as needed for nausea or vomiting.  Marland Kitchen Ophthalmic Irrigation Solution (OCUSOFT EYE [place] OP) Apply 1 application to eye daily.  . pantoprazole (PROTONIX) 40 MG tablet Take 1 tablet (40 mg total) by mouth daily.  . polyethylene glycol (MIRALAX / GLYCOLAX) packet Take 17 g by mouth every other day.  . senna-docusate (SENOKOT-S) 8.6-50 MG tablet Take 2 tablets 2 (two) times daily by mouth.   . sertraline (ZOLOFT) 50 MG tablet Take 50 mg by mouth at bedtime.  Marland Kitchen telmisartan (MICARDIS) 20 MG tablet Take 20 mg by mouth daily.  Marland Kitchen triamcinolone (NASACORT) 55 MCG/ACT AERO nasal inhaler Place 2 sprays into the nose at bedtime.   No facility-administered encounter medications on file as of 12/17/2020.    Review of Systems  Immunization History  Administered Date(s) Administered  . Influenza Inj Mdck Quad Pf 08/09/2017  . Influenza, High Dose Seasonal PF 07/31/2016, 07/31/2019  . Influenza,inj,Quad PF,6+ Mos 08/06/2018  . Influenza-Unspecified 08/01/2010, 07/30/2012, 10/16/2012, 10/16/2013, 08/06/2020  . Moderna Sars-Covid-2 Vaccination 11/18/2019, 02/23/2020, 08/26/2020  . Pneumococcal Polysaccharide-23 02/23/2004, 02/21/2013  . Pneumococcal-Unspecified 03/17/2016  . Td 12/02/2008  . Tdap 09/01/2016  . Zoster 03/28/2006  . Zoster Recombinat (Shingrix) 10/29/2017, 01/24/2018   Pertinent  Health Maintenance Due  Topic Date Due  . HEMOGLOBIN A1C  01/24/2021  . FOOT EXAM  11/22/2021  . INFLUENZA VACCINE  Completed  . PNA vac Low Risk Adult  Completed  . OPHTHALMOLOGY EXAM  Discontinued   Fall Risk  04/09/2020 09/06/2019 01/04/2019 07/17/2018 08/21/2017  Falls in the past year? 0 1 Exclusion - non ambulatory No No  Number falls in past yr: 0 0 - - -  Injury with Fall? 0 0 - - -  Risk for fall due to : Impaired balance/gait History of fall(s) - - -  Follow up Falls evaluation completed Falls evaluation completed - - -   Functional Status Survey:    Vitals:    12/17/20 0852  BP: 134/73  Pulse: 64  Resp: 15  Temp: (!) 97.2 F (36.2 C)  SpO2: 95%  Weight: 135 lb 9.6 oz (61.5 kg)  Height: 5' 8.5" (1.74 m)   Body mass index is 20.32 kg/m. Physical Exam  Labs reviewed: Recent Labs    01/12/20 0000 01/12/20 0600 07/26/20 0000 08/12/20 2211  NA 141  --  144 140  K 4.0  --  4.0 3.5  CL 104  --  107 106  CO2 25*  --  23* 23  GLUCOSE  --   --   --  144*  BUN 20  --  18 21  CREATININE 0.9  --  0.7 0.84  CALCIUM  --  9.7 9.7 9.3   No results for input(s): AST, ALT, ALKPHOS, BILITOT, PROT, ALBUMIN in the last 8760 hours. Recent Labs  01/12/20 0000 08/12/20 2211  WBC 9.7 9.3  NEUTROABS  --  5.8  HGB 11.2* 11.0*  HCT 34* 33.9*  MCV  --  85.6  PLT 331 303   Lab Results  Component Value Date   TSH 3.14 01/18/2018   Lab Results  Component Value Date   HGBA1C 7.4 07/26/2020   Lab Results  Component Value Date   CHOL 127 05/11/2017   HDL 50 05/11/2017   LDLCALC 58 05/11/2017   TRIG 98 05/11/2017    Significant Diagnostic Results in last 30 days:  No results found.  Assessment/Plan There are no diagnoses linked to this encounter.   Family/ staff Communication:   Labs/tests ordered:

## 2020-12-20 ENCOUNTER — Telehealth: Payer: Medicare Other | Admitting: Neurology

## 2020-12-20 NOTE — Progress Notes (Signed)
This encounter was created in error - please disregard.

## 2020-12-23 ENCOUNTER — Non-Acute Institutional Stay (SKILLED_NURSING_FACILITY): Payer: Medicare Other | Admitting: Adult Health

## 2020-12-23 DIAGNOSIS — Z978 Presence of other specified devices: Secondary | ICD-10-CM | POA: Diagnosis not present

## 2020-12-23 DIAGNOSIS — N401 Enlarged prostate with lower urinary tract symptoms: Secondary | ICD-10-CM

## 2020-12-23 DIAGNOSIS — R338 Other retention of urine: Secondary | ICD-10-CM | POA: Diagnosis not present

## 2020-12-23 DIAGNOSIS — R1312 Dysphagia, oropharyngeal phase: Secondary | ICD-10-CM

## 2020-12-23 DIAGNOSIS — F0280 Dementia in other diseases classified elsewhere without behavioral disturbance: Secondary | ICD-10-CM

## 2020-12-23 DIAGNOSIS — R634 Abnormal weight loss: Secondary | ICD-10-CM | POA: Diagnosis not present

## 2020-12-23 DIAGNOSIS — G214 Vascular parkinsonism: Secondary | ICD-10-CM | POA: Diagnosis not present

## 2020-12-23 DIAGNOSIS — G309 Alzheimer's disease, unspecified: Secondary | ICD-10-CM

## 2020-12-23 DIAGNOSIS — E119 Type 2 diabetes mellitus without complications: Secondary | ICD-10-CM | POA: Diagnosis not present

## 2020-12-23 DIAGNOSIS — F0150 Vascular dementia without behavioral disturbance: Secondary | ICD-10-CM

## 2020-12-24 ENCOUNTER — Encounter: Payer: Self-pay | Admitting: Adult Health

## 2020-12-24 NOTE — Progress Notes (Signed)
Location:  Occupational psychologist of Service:  SNF (31) Provider:   Cindi Carbon, ANP [place] [phone]   [name], DO  Patient Care Team: [name], DO as PCP - General (Geriatric Medicine) Danella Sensing, MD as Consulting Physician (Dermatology) Sharyne Peach, MD as Consulting Physician (Ophthalmology) Irine Seal, MD as Attending Physician (Urology) [name], MD as Consulting Physician (Neurology)  Extended Emergency Contact Information Primary Emergency Contact: Pocock,[name] Address: [address]          [place] [address] [name] of [place] Phone: [phone] Relation: Spouse Secondary Emergency Contact: [name] States of [place] Mobile Phone: [phone] Relation: Daughter  Code Status:  DNR Goals of care: Advanced Directive information Advanced Directives 12/17/2020  Does Patient Have a Medical Advance Directive? Yes  Type of Paramedic of [place];Living will;Out of facility DNR (pink MOST or yellow form)  Does patient want to make changes to medical advance directive? No - Patient declined  Copy of [place] in Chart? Yes - validated most recent copy scanned in chart (See row information)  Would patient like information on creating a medical advance directive? -  Pre-existing out of facility DNR order (yellow form or pink MOST form) Yellow form placed in chart (order not valid for inpatient use)     Chief Complaint  Patient presents with  . Medical Management of Chronic Issues    HPI:  Pt is a 87 y.o. male seen today for medical management of chronic diseases.    Appetite has shown slight improvement after discontinuing Namzaric. After a period of weight loss, his weight has trended upward. Also taking glucerna.  Wt Readings from Last 3 Encounters:  12/24/20 136 lb 9.6 oz (62 kg)  12/17/20 135 lb 9.6 oz (61.5 kg)  11/22/20  131 lb 1.6 oz (59.5 kg)   Nurse reports periods of agitation and delusions but no hitting violent behavior. Thinks he is going to work or has somewhere to be.   Continues on a D3 diet with thin liquid. No issues with choking or coughing related to meals. Does have a chronic cough which is unchanged  Past Medical History:  Diagnosis Date  . Abnormality of gait   . Arthritis   . Brain bleed ([place]) 09/01/2016  . Cervical spondylosis   . Controlled type 2 diabetes mellitus without complication, without long-term current use of insulin ([place]) 06/24/2019  . Degenerative joint disease (DJD) of lumbar spine   . Dementia ([place]) 04/28/2019  . Depression   . Diplopia   . Dyslipidemia   . Essential tremor   . Foul smelling urine   . Frequent falls   . GERD (gastroesophageal reflux disease)   . Glycosuria   . Hearing difficulty    hearing aids  . Hyperlipidemia   . Hypertension   . Hyperthyroidism   . Memory loss   . Osteoarthritis   . Radiculopathy, lumbar region   . 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 Small vessel disease ([place])   . TIA (transient ischemic attack)    Past Surgical History:  Procedure Laterality Date  . APPENDECTOMY     done as a child  . GALLBLADDER SURGERY  2008  . KNEE ARTHROSCOPY Left    Dr. Hart Robinsons 2002  . knee injections Right    Dr. Adriana Mccallum  . TONSILLECTOMY     done  as a child    No Known Allergies  Outpatient Encounter Medications as of 12/23/2020  Medication Sig  . amLODipine (NORVASC) 10 MG tablet Take 1 tablet (10 mg total) by mouth daily.  Marland Kitchen ammonium lactate (AMLACTIN) 12 % cream Apply 1 Bottle topically at bedtime.  Marland Kitchen aspirin EC 81 MG tablet Take 81 mg by mouth daily.  . carbidopa-levodopa (SINEMET IR) 25-100 MG tablet Take 1.5 tablets by mouth 3 (three) times daily.  . carboxymethylcellulose (REFRESH PLUS) 0.5 % SOLN Apply 2 drops to eye every 4 (four) hours as needed (dry eyes).  . clopidogrel (PLAVIX) 75 MG  tablet Take 75 mg by mouth daily.  . Cranberry 400 MG CAPS Take 2 capsules by mouth 3 (three) times daily.   . Glucerna (GLUCERNA) LIQD Take 237 mLs by mouth.  . hydrALAZINE (APRESOLINE) 25 MG tablet Take 25 mg by mouth daily as needed. SBP>160  . ipratropium-albuterol (DUONEB) 0.5-2.5 (3) MG/3ML SOLN Take 3 mLs by nebulization every 8 (eight) hours as needed.  Marland Kitchen MELATONIN-CHAMOMILE PO Take by mouth at bedtime as needed.  . Menthol, Topical Analgesic, 4 % GEL Apply 1 application topically 2 (two) times daily as needed (pain).   . ondansetron (ZOFRAN) 4 MG tablet Take 4 mg by mouth every 8 (eight) hours as needed for nausea or vomiting.  Marland Kitchen Ophthalmic Irrigation Solution (OCUSOFT EYE [place] OP) Apply 1 application to eye daily.  . pantoprazole (PROTONIX) 40 MG tablet Take 1 tablet (40 mg total) by mouth daily.  . polyethylene glycol (MIRALAX / GLYCOLAX) packet Take 17 g by mouth every other day.  . senna-docusate (SENOKOT-S) 8.6-50 MG tablet Take 2 tablets 2 (two) times daily by mouth.   . sertraline (ZOLOFT) 50 MG tablet Take 50 mg by mouth at bedtime.  Marland Kitchen telmisartan (MICARDIS) 20 MG tablet Take 20 mg by mouth daily.  Marland Kitchen triamcinolone (NASACORT) 55 MCG/ACT AERO nasal inhaler Place 2 sprays into the nose at bedtime.   No facility-administered encounter medications on file as of 12/23/2020.    Review of Systems  Unable to perform ROS: Dementia    Immunization History  Administered Date(s) Administered  . Influenza Inj Mdck Quad Pf 08/09/2017  . Influenza, High Dose Seasonal PF 07/31/2016, 07/31/2019  . Influenza,inj,Quad PF,6+ Mos 08/06/2018  . Influenza-Unspecified 08/01/2010, 07/30/2012, 10/16/2012, 10/16/2013, 08/06/2020  . Moderna Sars-Covid-2 Vaccination 11/18/2019, 02/23/2020, 08/26/2020  . Pneumococcal Polysaccharide-23 02/23/2004, 02/21/2013  . Pneumococcal-Unspecified 03/17/2016  . Td 12/02/2008  . Tdap 09/01/2016  . Zoster 03/28/2006  . Zoster Recombinat (Shingrix) 10/29/2017,  01/24/2018   Pertinent  Health Maintenance Due  Topic Date Due  . HEMOGLOBIN A1C  01/24/2021  . FOOT EXAM  11/22/2021  . INFLUENZA VACCINE  Completed  . PNA vac Low Risk Adult  Completed  . OPHTHALMOLOGY EXAM  Discontinued   Fall Risk  04/09/2020 09/06/2019 01/04/2019 07/17/2018 08/21/2017  Falls in the past year? 0 1 Exclusion - non ambulatory No No  Number falls in past yr: 0 0 - - -  Injury with Fall? 0 0 - - -  Risk for fall due to : Impaired balance/gait History of fall(s) - - -  Follow up Falls evaluation completed Falls evaluation completed - - -   Functional Status Survey:    Vitals:   12/24/20 1330  Weight: 136 lb 9.6 oz (62 kg)   Body mass index is 20.47 kg/m.  Wt Readings from Last 3 Encounters:  12/24/20 136 lb 9.6 oz (62 kg)  12/17/20 135 lb  9.6 oz (61.5 kg)  11/22/20 131 lb 1.6 oz (59.5 kg)    Physical Exam Vitals and nursing note reviewed.  Constitutional:      General: He is not in acute distress.    Appearance: He is not diaphoretic.     Comments: Keeps eyes closed but responds  HENT:     Head: Normocephalic and atraumatic.     Mouth/Throat:     Mouth: Mucous membranes are moist.     Pharynx: Oropharynx is clear.  Neck:     Thyroid: No thyromegaly.     Vascular: No JVD.     Trachea: No tracheal deviation.  Cardiovascular:     Rate and Rhythm: Normal rate and regular rhythm.     Heart sounds: No murmur heard.   Pulmonary:     Effort: Pulmonary effort is normal. No respiratory distress.     Breath sounds: Normal breath sounds. No wheezing.  Abdominal:     General: Bowel sounds are normal. There is no distension.     Palpations: Abdomen is soft.     Tenderness: There is no abdominal tenderness.  Musculoskeletal:     Cervical back: No rigidity or tenderness.     Right lower leg: No edema.     Left lower leg: No edema.     Comments: Rigidity noted to BUE and BLE  Lymphadenopathy:     Cervical: No cervical adenopathy.  Skin:    General: Skin  is warm and dry.  Neurological:     General: No focal deficit present.     Mental Status: Mental status is at baseline.     Labs reviewed: Recent Labs    01/12/20 0000 01/12/20 0600 07/26/20 0000 08/12/20 2211  NA 141  --  144 140  K 4.0  --  4.0 3.5  CL 104  --  107 106  CO2 25*  --  23* 23  GLUCOSE  --   --   --  144*  BUN 20  --  18 21  CREATININE 0.9  --  0.7 0.84  CALCIUM  --  9.7 9.7 9.3   No results for input(s): AST, ALT, ALKPHOS, BILITOT, PROT, ALBUMIN in the last 8760 hours. Recent Labs    01/12/20 0000 08/12/20 2211  WBC 9.7 9.3  NEUTROABS  --  5.8  HGB 11.2* 11.0*  HCT 34* 33.9*  MCV  --  85.6  PLT 331 303   Lab Results  Component Value Date   TSH 3.14 01/18/2018   Lab Results  Component Value Date   HGBA1C 7.4 07/26/2020   Lab Results  Component Value Date   CHOL 127 05/11/2017   HDL 50 05/11/2017   LDLCALC 58 05/11/2017   TRIG 98 05/11/2017    Significant Diagnostic Results in last 30 days:  No results found.  Assessment/Plan  1. 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 DNR in place.  Off Namzaric.   2. 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 Controlled type 2 diabetes mellitus without complication, without long-term current use of insulin ([facility]) Lab Results  Component Value Date   HGBA1C 7.4 07/26/2020  Diet controlled Goal <8%  4. Benign prostatic hyperplasia with urinary retention Has a catheter with recurrent UTI   5. Chronic indwelling Foley catheter Per [facility]  No current issues.   6. Oropharyngeal  dysphagia Continue D3 diet with thin liquids.   7. Weight loss Improved off Namzaric Continue Glucerna   Family/ staff Communication:nurse  Labs/tests ordered: NA

## 2021-01-10 DIAGNOSIS — I10 Essential (primary) hypertension: Secondary | ICD-10-CM | POA: Diagnosis not present

## 2021-01-10 LAB — CBC: RBC: 4.24 (ref 3.87–5.11)

## 2021-01-10 LAB — CBC AND DIFFERENTIAL
HCT: 36 — AB (ref 41–53)
Hemoglobin: 11.7 — AB (ref 13.5–17.5)
Platelets: 398 (ref 150–399)
WBC: 7.4

## 2021-01-10 LAB — COMPREHENSIVE METABOLIC PANEL: Calcium: 9.6 (ref 8.7–10.7)

## 2021-01-10 LAB — BASIC METABOLIC PANEL
BUN: 22 — AB (ref 4–21)
CO2: 28 — AB (ref 13–22)
Chloride: 104 (ref 99–108)
Creatinine: 1 (ref 0.6–1.3)
Glucose: 148
Potassium: 4.8 (ref 3.4–5.3)
Sodium: 140 (ref 137–147)

## 2021-01-11 ENCOUNTER — Non-Acute Institutional Stay (SKILLED_NURSING_FACILITY): Payer: Medicare Other | Admitting: Adult Health

## 2021-01-11 ENCOUNTER — Encounter: Payer: Self-pay | Admitting: Internal Medicine

## 2021-01-11 ENCOUNTER — Encounter: Payer: Self-pay | Admitting: Adult Health

## 2021-01-11 DIAGNOSIS — F0150 Vascular dementia without behavioral disturbance: Secondary | ICD-10-CM | POA: Diagnosis not present

## 2021-01-11 DIAGNOSIS — G309 Alzheimer's disease, unspecified: Secondary | ICD-10-CM

## 2021-01-11 DIAGNOSIS — R1111 Vomiting without nausea: Secondary | ICD-10-CM | POA: Diagnosis not present

## 2021-01-11 DIAGNOSIS — I10 Essential (primary) hypertension: Secondary | ICD-10-CM | POA: Diagnosis not present

## 2021-01-11 DIAGNOSIS — N390 Urinary tract infection, site not specified: Secondary | ICD-10-CM | POA: Diagnosis not present

## 2021-01-11 DIAGNOSIS — R112 Nausea with vomiting, unspecified: Secondary | ICD-10-CM

## 2021-01-11 DIAGNOSIS — F0280 Dementia in other diseases classified elsewhere without behavioral disturbance: Secondary | ICD-10-CM

## 2021-01-11 NOTE — Progress Notes (Signed)
Location:  Occupational psychologist of Service:  SNF (31) Provider:   Cindi Carbon, ANP [place] [phone]   [name], DO  Patient Care Team: [name], DO as PCP - General (Geriatric Medicine) Danella Sensing, MD as Consulting Physician (Dermatology) Sharyne Peach, MD as Consulting Physician (Ophthalmology) Irine Seal, MD as Attending Physician (Urology) [name], MD as Consulting Physician (Neurology)  Extended Emergency Contact Information Primary Emergency Contact: Hoel,[name] Address: [address]          [place] [address] [name] of [place] Phone: [phone] Relation: Spouse Secondary Emergency Contact: [name] States of [place] Mobile Phone: [phone] Relation: Daughter  Code Status:  DNR Goals of care: Advanced Directive information Advanced Directives 12/17/2020  Does Patient Have a Medical Advance Directive? Yes  Type of Paramedic of [place];Living will;Out of facility DNR (pink MOST or yellow form)  Does patient want to make changes to medical advance directive? No - Patient declined  Copy of [place] in Chart? Yes - validated most recent copy scanned in chart (See row information)  Would patient like information on creating a medical advance directive? -  Pre-existing out of facility DNR order (yellow form or pink MOST form) Yellow form placed in chart (order not valid for inpatient use)     Chief Complaint  Patient presents with  . Acute Visit    vomiting    HPI:  Pt is a 87 y.o. male seen today for an acute visit for nausea and vomiting. Mr. Moncus has a hx of severe mixed dementia (AD and vascular) and resides in skilled care. The nurse reported n/v with 5 episodes of vomiting on 3/28. He had some moaning and possible abd pain. No fever, sob, dysuria etc. He has a foley cath with clear urine draining but tends to  get UTIs. Urine out put 225 on night shift. Bladder scan 112-118 on 3/28 He had a phenergan supp on 3/28 and has not vomited since. Bowels are moving. Taking in small amts of food and liquid. Pulse was 125 and BP 95/60 on 3/28 but now pulse is 66 and BP 101/59.  Nurse held bp meds this am.   He is awake and able to f/c now. No issues with agitation at this time but periodic does become delusional and agitated at baseline.  CBC and BMP were ordered with no acute changes  KUB and UA ordered and are pending  Past Medical History:  Diagnosis Date  . Abnormality of gait   . Arthritis   . Brain bleed ([facility]) 09/01/2016  . Cervical spondylosis   . Controlled type 2 diabetes mellitus without complication, without long-term current use of insulin ([place]) 06/24/2019  . Degenerative joint disease (DJD) of lumbar spine   . Dementia ([place]) 04/28/2019  . Depression   . Diplopia   . Dyslipidemia   . Essential tremor   . Foul smelling urine   . Frequent falls   . GERD (gastroesophageal reflux disease)   . Glycosuria   . Hearing difficulty    hearing aids  . Hyperlipidemia   . Hypertension   . Hyperthyroidism   . Memory loss   . Osteoarthritis   . Radiculopathy, lumbar region   . 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 Small vessel disease ([facility])   . TIA (transient ischemic attack)    Past Surgical History:  Procedure Laterality Date  . APPENDECTOMY     done as a child  . GALLBLADDER SURGERY  2008  . KNEE ARTHROSCOPY Left    Dr. Hart Robinsons 2002  . knee injections Right    Dr. Adriana Mccallum  . TONSILLECTOMY     done as a child    No Known Allergies  Outpatient Encounter Medications as of 01/11/2021  Medication Sig  . amLODipine (NORVASC) 10 MG tablet Take 1 tablet (10 mg total) by mouth daily.  Marland Kitchen ammonium lactate (AMLACTIN) 12 % cream Apply 1 Bottle topically at bedtime.  Marland Kitchen aspirin EC 81 MG tablet Take 81 mg by mouth daily.  . carbidopa-levodopa (SINEMET IR) 25-100  MG tablet Take 1.5 tablets by mouth 3 (three) times daily.  . carboxymethylcellulose (REFRESH PLUS) 0.5 % SOLN Apply 2 drops to eye every 4 (four) hours as needed (dry eyes).  . clopidogrel (PLAVIX) 75 MG tablet Take 75 mg by mouth daily.  . Cranberry 400 MG CAPS Take 2 capsules by mouth 3 (three) times daily.   . Glucerna (GLUCERNA) LIQD Take 237 mLs by mouth.  . hydrALAZINE (APRESOLINE) 25 MG tablet Take 25 mg by mouth daily as needed. SBP>160  . ipratropium-albuterol (DUONEB) 0.5-2.5 (3) MG/3ML SOLN Take 3 mLs by nebulization every 8 (eight) hours as needed.  Marland Kitchen MELATONIN-CHAMOMILE PO Take by mouth at bedtime as needed.  . Menthol, Topical Analgesic, 4 % GEL Apply 1 application topically 2 (two) times daily as needed (pain).   . ondansetron (ZOFRAN) 4 MG tablet Take 4 mg by mouth every 8 (eight) hours as needed for nausea or vomiting.  Marland Kitchen Ophthalmic Irrigation Solution (OCUSOFT EYE [place] OP) Apply 1 application to eye daily.  . pantoprazole (PROTONIX) 40 MG tablet Take 1 tablet (40 mg total) by mouth daily.  . polyethylene glycol (MIRALAX / GLYCOLAX) packet Take 17 g by mouth every other day.  . senna-docusate (SENOKOT-S) 8.6-50 MG tablet Take 2 tablets 2 (two) times daily by mouth.   . sertraline (ZOLOFT) 50 MG tablet Take 50 mg by mouth at bedtime.  Marland Kitchen telmisartan (MICARDIS) 20 MG tablet Take 20 mg by mouth daily.  Marland Kitchen triamcinolone (NASACORT) 55 MCG/ACT AERO nasal inhaler Place 2 sprays into the nose at bedtime.   No facility-administered encounter medications on file as of 01/11/2021.    Review of Systems  Unable to perform ROS: Dementia    Immunization History  Administered Date(s) Administered  . Influenza Inj Mdck Quad Pf 08/09/2017  . Influenza, High Dose Seasonal PF 07/31/2016, 07/31/2019  . Influenza,inj,Quad PF,6+ Mos 08/06/2018  . Influenza-Unspecified 08/01/2010, 07/30/2012, 10/16/2012, 10/16/2013, 08/06/2020  . Moderna Sars-Covid-2 Vaccination 11/18/2019, 02/23/2020,  08/26/2020  . Pneumococcal Polysaccharide-23 02/23/2004, 02/21/2013  . Pneumococcal-Unspecified 03/17/2016  . Td 12/02/2008  . Tdap 09/01/2016  . Zoster 03/28/2006  . Zoster Recombinat (Shingrix) 10/29/2017, 01/24/2018   Pertinent  Health Maintenance Due  Topic Date Due  . HEMOGLOBIN A1C  01/24/2021  . FOOT EXAM  11/22/2021  . INFLUENZA VACCINE  Completed  . PNA vac Low Risk Adult  Completed  . OPHTHALMOLOGY EXAM  Discontinued   Fall Risk  04/09/2020 09/06/2019 01/04/2019 07/17/2018 08/21/2017  Falls in the past year? 0 1 Exclusion - non ambulatory No No  Number falls in past yr: 0 0 - - -  Injury with Fall? 0 0 - - -  Risk for fall due to : Impaired balance/gait History of fall(s) - - -  Follow up Falls evaluation completed Falls evaluation completed - - -  Functional Status Survey:    Vitals:   01/11/21 1303  BP: (!) 101/59  Pulse: 66  Resp: 15  Temp: (!) 97 F (36.1 C)  SpO2: 92%   There is no height or weight on file to calculate BMI. Physical Exam Vitals and nursing note reviewed.  Constitutional:      General: He is not in acute distress.    Appearance: He is not diaphoretic.  HENT:     Head: Normocephalic and atraumatic.  Neck:     Thyroid: No thyromegaly.     Vascular: No JVD.     Trachea: No tracheal deviation.  Cardiovascular:     Rate and Rhythm: Normal rate and regular rhythm.     Heart sounds: No murmur heard.   Pulmonary:     Effort: Pulmonary effort is normal. No respiratory distress.     Breath sounds: Rhonchi (to LUL but cleared ) present. No wheezing.  Abdominal:     General: Bowel sounds are normal. There is no distension.     Palpations: Abdomen is soft.     Tenderness: There is no abdominal tenderness.  Musculoskeletal:     Right lower leg: No edema.     Left lower leg: No edema.     Comments: Decreased ROM with rigidity of BLE and BUE  Lymphadenopathy:     Cervical: No cervical adenopathy.  Skin:    General: Skin is warm and dry.   Neurological:     Mental Status: He is alert and oriented to person, place, and time.     Cranial Nerves: No cranial nerve deficit.     Labs reviewed: Recent Labs    07/26/20 0000 08/12/20 2211  NA 144 140  K 4.0 3.5  CL 107 106  CO2 23* 23  GLUCOSE  --  144*  BUN 18 21  CREATININE 0.7 0.84  CALCIUM 9.7 9.3   No results for input(s): AST, ALT, ALKPHOS, BILITOT, PROT, ALBUMIN in the last 8760 hours. Recent Labs    08/12/20 2211  WBC 9.3  NEUTROABS 5.8  HGB 11.0*  HCT 33.9*  MCV 85.6  PLT 303   Lab Results  Component Value Date   TSH 3.14 01/18/2018   Lab Results  Component Value Date   HGBA1C 7.4 07/26/2020   Lab Results  Component Value Date   CHOL 127 05/11/2017   HDL 50 05/11/2017   LDLCALC 58 05/11/2017   TRIG 98 05/11/2017    Significant Diagnostic Results in last 30 days:  No results found.  Assessment/Plan 1. Non-intractable vomiting with nausea, unspecified vomiting type Improved after antiemetic KUB pending but abd exam appear benign Possible gastroenteritis vs UTI (had abd pain) Encourage oral fluid as able  2. Essential hypertension BP soft today and norvasc and telmisartan held Staff to continue to monitor vitals and hold if necessary   3. 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     Family/ staff Communication: nurse discussed with POA [name]  Labs/tests ordered:  CBC and BMP done, KUB and UA pending

## 2021-01-17 ENCOUNTER — Encounter: Payer: Self-pay | Admitting: *Deleted

## 2021-01-18 ENCOUNTER — Non-Acute Institutional Stay (SKILLED_NURSING_FACILITY): Payer: Medicare Other | Admitting: Orthopedic Surgery

## 2021-01-18 ENCOUNTER — Encounter: Payer: Self-pay | Admitting: Orthopedic Surgery

## 2021-01-18 DIAGNOSIS — Z978 Presence of other specified devices: Secondary | ICD-10-CM | POA: Diagnosis not present

## 2021-01-18 DIAGNOSIS — G309 Alzheimer's disease, unspecified: Secondary | ICD-10-CM

## 2021-01-18 DIAGNOSIS — N401 Enlarged prostate with lower urinary tract symptoms: Secondary | ICD-10-CM

## 2021-01-18 DIAGNOSIS — R1312 Dysphagia, oropharyngeal phase: Secondary | ICD-10-CM

## 2021-01-18 DIAGNOSIS — G214 Vascular parkinsonism: Secondary | ICD-10-CM | POA: Diagnosis not present

## 2021-01-18 DIAGNOSIS — F0280 Dementia in other diseases classified elsewhere without behavioral disturbance: Secondary | ICD-10-CM

## 2021-01-18 DIAGNOSIS — R338 Other retention of urine: Secondary | ICD-10-CM

## 2021-01-18 DIAGNOSIS — R634 Abnormal weight loss: Secondary | ICD-10-CM

## 2021-01-18 DIAGNOSIS — F0150 Vascular dementia without behavioral disturbance: Secondary | ICD-10-CM

## 2021-01-18 NOTE — Progress Notes (Signed)
Location:   [facility] Room Number: 400-Q Place of Service:  SNF (31) Provider:  Windell Moulding, NP    Patient Care Team: Virgie Dad, MD as PCP - General (Internal Medicine) Danella Sensing, MD as Consulting Physician (Dermatology) Sharyne Peach, MD as Consulting Physician (Ophthalmology) Irine Seal, MD as Attending Physician (Urology) [name], MD as Consulting Physician (Neurology)  Extended Emergency Contact Information Primary Emergency Contact: Tinkle,[name] Address: [address]          [place] [address] [name] of [place] Phone: [phone] Relation: Spouse Secondary Emergency Contact: [place] of [place] Mobile Phone: [phone] Relation: Daughter  Code Status:  DNR Goals of care: Advanced Directive information Advanced Directives 01/18/2021  Does Patient Have a Medical Advance Directive? Yes  Type of Advance Directive Living will;Out of facility DNR (pink MOST or yellow form)  Does patient want to make changes to medical advance directive? No - Patient declined  Copy of [place] in Chart? -  Would patient like information on creating a medical advance directive? -  Pre-existing out of facility DNR order (yellow form or pink MOST form) -     Chief Complaint  Patient presents with  . Medical Management of Chronic Issues    Routine Visit.    HPI:  Pt is a 87 y.o. male seen today for medical management of chronic diseases.    Appetite continues to vary. He has also been noncompliant with medications and will spit them out. He continues to lose weight due to poor appetite. Remains on heart healthy/chopped diet with thin liquids. No recent aspirations.   Recent weight trends are as follows:  04/05- 132.2 lbs  03/11- 136 lbs  02/07- 131 lbs  No recent behavioral outbursts reported.   Foley changed by [facility] this morning. He tolerated procedure  well. Scheduled repeat foley change 05/04. Given Cefpodoxine 100 mg po bid x 7 days for UTI. Since start of antibiotic he is moaning less. Urine remains amber.     Past Medical History:  Diagnosis Date  . Abnormality of gait   . Arthritis   . Brain bleed ([place]) 09/01/2016  . Cervical spondylosis   . Controlled type 2 diabetes mellitus without complication, without long-term current use of insulin ([place]) 06/24/2019  . Degenerative joint disease (DJD) of lumbar spine   . Dementia ([place]) 04/28/2019  . Depression   . Diplopia   . Dyslipidemia   . Essential tremor   . Foul smelling urine   . Frequent falls   . GERD (gastroesophageal reflux disease)   . Glycosuria   . Hearing difficulty    hearing aids  . Hyperlipidemia   . Hypertension   . Hyperthyroidism   . Memory loss   . Osteoarthritis   . Radiculopathy, lumbar region   . 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 Small vessel disease ([place])   . TIA (transient ischemic attack)    Past Surgical History:  Procedure Laterality Date  . APPENDECTOMY     done as a child  . GALLBLADDER SURGERY  2008  . KNEE ARTHROSCOPY Left    Dr. Hart Robinsons 2002  . knee injections Right    Dr. Adriana Mccallum  . TONSILLECTOMY     done as a child    No Known Allergies  Allergies as of 01/18/2021   No Known Allergies     Medication List  Accurate as of January 18, 2021  3:38 PM. If you have any questions, ask your nurse or doctor.        STOP taking these medications   Glucerna Liqd Stopped by: Yvonna Alanis, NP   MELATONIN-CHAMOMILE PO Stopped by: Yvonna Alanis, NP   pantoprazole 40 MG tablet Commonly known as: Protonix Stopped by: Yvonna Alanis, NP     TAKE these medications   amLODipine 10 MG tablet Commonly known as: NORVASC Take 1 tablet (10 mg total) by mouth daily.   ammonium lactate 12 % cream Commonly known as: AMLACTIN Apply 1 Bottle topically at bedtime.   aspirin EC 81 MG tablet Take 81 mg by  mouth daily.   carbidopa-levodopa 25-100 MG tablet Commonly known as: SINEMET IR Take 1.5 tablets by mouth 3 (three) times daily.   carboxymethylcellulose 0.5 % Soln Commonly known as: REFRESH PLUS Apply 2 drops to eye every 4 (four) hours as needed (dry eyes).   cefpodoxime 100 MG tablet Commonly known as: VANTIN Take 100 mg by mouth 2 (two) times daily.   clopidogrel 75 MG tablet Commonly known as: PLAVIX Take 75 mg by mouth daily.   Cranberry 400 MG Caps Take 2 capsules by mouth 3 (three) times daily.   hydrALAZINE 25 MG tablet Commonly known as: APRESOLINE Take 25 mg by mouth daily as needed. SBP>160   ipratropium-albuterol 0.5-2.5 (3) MG/3ML Soln Commonly known as: DUONEB Take 3 mLs by nebulization every 8 (eight) hours as needed.   Menthol (Topical Analgesic) 4 % Gel Apply 1 application topically 2 (two) times daily as needed (pain).   OCUSOFT EYE [place] OP Apply 1 application to eye daily.   ondansetron 4 MG tablet Commonly known as: ZOFRAN Take 4 mg by mouth every 8 (eight) hours as needed for nausea or vomiting.   polyethylene glycol 17 g packet Commonly known as: MIRALAX / GLYCOLAX Take 17 g by mouth every other day.   senna-docusate 8.6-50 MG tablet Commonly known as: Senokot-S Take 2 tablets 2 (two) times daily by mouth.   sertraline 50 MG tablet Commonly known as: ZOLOFT Take 50 mg by mouth at bedtime.   telmisartan 20 MG tablet Commonly known as: MICARDIS Take 20 mg by mouth daily.   triamcinolone 55 MCG/ACT Aero nasal inhaler Commonly known as: NASACORT Place 2 sprays into the nose at bedtime.       Review of Systems  Unable to perform ROS: Dementia    Immunization History  Administered Date(s) Administered  . Influenza Inj Mdck Quad Pf 08/09/2017  . Influenza, High Dose Seasonal PF 07/31/2016, 07/31/2019  . Influenza,inj,Quad PF,6+ Mos 08/06/2018  . Influenza-Unspecified 08/01/2010, 07/30/2012, 10/16/2012, 10/16/2013, 08/06/2020  .  Moderna Sars-Covid-2 Vaccination 11/18/2019, 02/23/2020, 08/26/2020  . Pneumococcal Polysaccharide-23 02/23/2004, 02/21/2013  . Pneumococcal-Unspecified 03/17/2016  . Td 12/02/2008  . Tdap 09/01/2016  . Zoster 03/28/2006  . Zoster Recombinat (Shingrix) 10/29/2017, 01/24/2018   Pertinent  Health Maintenance Due  Topic Date Due  . HEMOGLOBIN A1C  01/24/2021  . INFLUENZA VACCINE  05/16/2021  . FOOT EXAM  11/22/2021  . PNA vac Low Risk Adult  Completed  . OPHTHALMOLOGY EXAM  Discontinued   Fall Risk  04/09/2020 09/06/2019 01/04/2019 07/17/2018 08/21/2017  Falls in the past year? 0 1 Exclusion - non ambulatory No No  Number falls in past yr: 0 0 - - -  Injury with Fall? 0 0 - - -  Risk for fall due to : Impaired balance/gait History of fall(s) - - -  Follow up Falls evaluation completed Falls evaluation completed - - -   Functional Status Survey:    Vitals:   01/18/21 1529  BP: 136/65  Pulse: 68  Resp: 15  Temp: 97.8 F (36.6 C)  SpO2: 94%  Weight: 132 lb 3.2 oz (60 kg)  Height: 5' 8.5" (1.74 m)   Body mass index is 19.81 kg/m. Physical Exam Vitals reviewed.  Constitutional:      General: He is not in acute distress. HENT:     Head: Normocephalic.     Right Ear: There is no impacted cerumen.     Left Ear: There is no impacted cerumen.     Ears:     Comments: Bilateral hearing aid    Nose: Nose normal.     Mouth/Throat:     Mouth: Mucous membranes are moist.     Pharynx: No posterior oropharyngeal erythema.  Eyes:     General:        Right eye: No discharge.        Left eye: No discharge.  Cardiovascular:     Rate and Rhythm: Normal rate. Rhythm irregular.     Pulses: Normal pulses.     Heart sounds: Normal heart sounds. No murmur heard.   Pulmonary:     Effort: Pulmonary effort is normal. No respiratory distress.     Breath sounds: Normal breath sounds. No wheezing.  Abdominal:     General: Bowel sounds are normal. There is no distension.     Palpations:  Abdomen is soft.     Tenderness: There is no abdominal tenderness.  Genitourinary:    Comments: Foley, amber urine Musculoskeletal:     Cervical back: Normal range of motion.     Right lower leg: No edema.     Left lower leg: No edema.     Comments: Soft boots to heels. Left hand contracted  Lymphadenopathy:     Cervical: No cervical adenopathy.  Skin:    General: Skin is warm and dry.     Capillary Refill: Capillary refill takes less than 2 seconds.  Neurological:     General: No focal deficit present.     Mental Status: He is alert. Mental status is at baseline.     Motor: Weakness present.     Gait: Gait abnormal.     Comments: Hoyer lift  Psychiatric:        Mood and Affect: Mood normal. Affect is flat.        Behavior: Behavior normal.        Cognition and Memory: Memory is impaired.     Labs reviewed: Recent Labs    07/26/20 0000 08/12/20 2211 01/10/21 0000  NA 144 140 140  K 4.0 3.5 4.8  CL 107 106 104  CO2 23* 23 28*  GLUCOSE  --  144*  --   BUN 18 21 22*  CREATININE 0.7 0.84 1.0  CALCIUM 9.7 9.3 9.6   No results for input(s): AST, ALT, ALKPHOS, BILITOT, PROT, ALBUMIN in the last 8760 hours. Recent Labs    08/12/20 2211 01/10/21 0000  WBC 9.3 7.4  NEUTROABS 5.8  --   HGB 11.0* 11.7*  HCT 33.9* 36*  MCV 85.6  --   PLT 303 398   Lab Results  Component Value Date   TSH 3.14 01/18/2018   Lab Results  Component Value Date   HGBA1C 7.4 07/26/2020   Lab Results  Component Value Date   CHOL 127 05/11/2017  HDL 50 05/11/2017   LDLCALC 58 05/11/2017   TRIG 98 05/11/2017    Significant Diagnostic Results in last 30 days:  No results found.  Assessment/Plan 1. Chronic indwelling Foley catheter - followed by [facility] - foley changed today, f/u in 1 month - remains at risk for recurrent infection - remains afebrile, recently given cefpodoxine for UTI - cont cranberry supplement for UTI prevention  2. Mixed Alzheimer's and vascular  dementia ([place]) - remains in advanced stages - no recent behavioral outbursts  - goal of care: no hospitalizations  3. 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 Benign prostatic hyperplasia with urinary retention - continues to have foley - remains at risk for UTI  5. Oropharyngeal dysphagia - no recent aspirations - cont chopped diet and this liquids  6. Weight loss - due to advanced stages of dementia - cont to observe feedings    Family/ staff Communication: Plan discussed with nurse  Labs/tests ordered:  none

## 2021-01-24 IMAGING — CT CT RENAL STONE PROTOCOL
2 of 4 series · 16 of 46 positions shown, 18 images · non-contrast
Comparison: None.

CLINICAL DATA: Flank pain

EXAM:
CT ABDOMEN AND PELVIS WITHOUT CONTRAST
TECHNIQUE: Multidetector CT imaging of the abdomen and pelvis was performed
following the standard protocol without IV contrast.

[Series 3: axial st · axial · 0.79mm/px · z∈[+1010,+1465]mm · 13 of 103 slices shown, 15 images]
[im 6/103  soft-tissue]
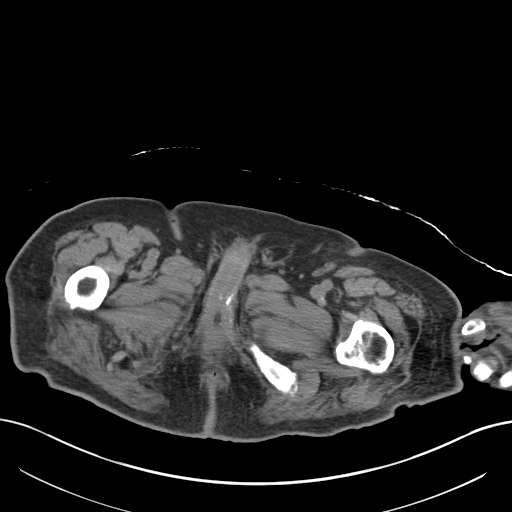
[im 6/103  bone]
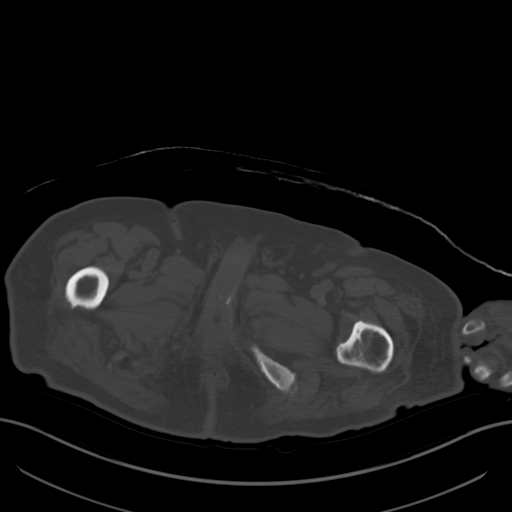
[im 12/103  soft-tissue]
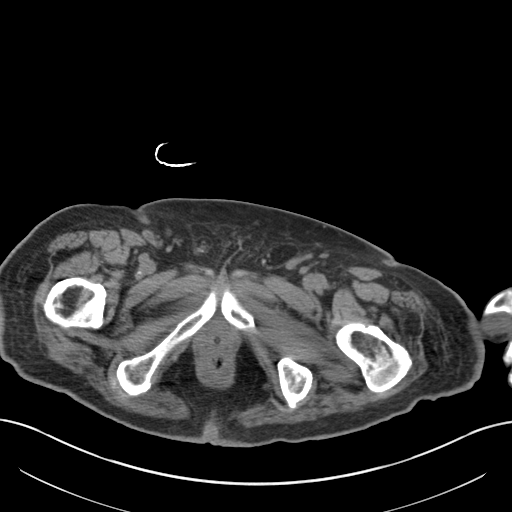
[im 23/103  soft-tissue]
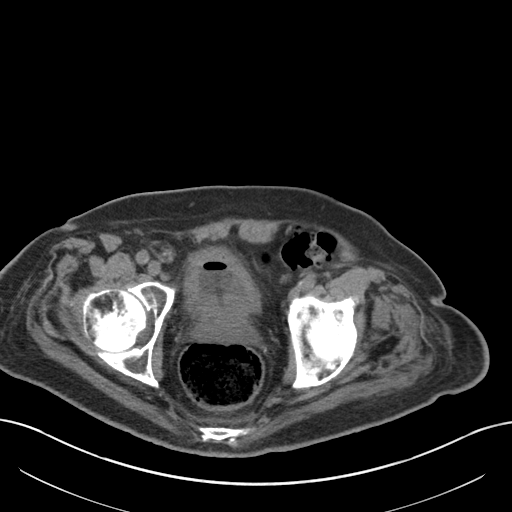
[im 29/103  soft-tissue]
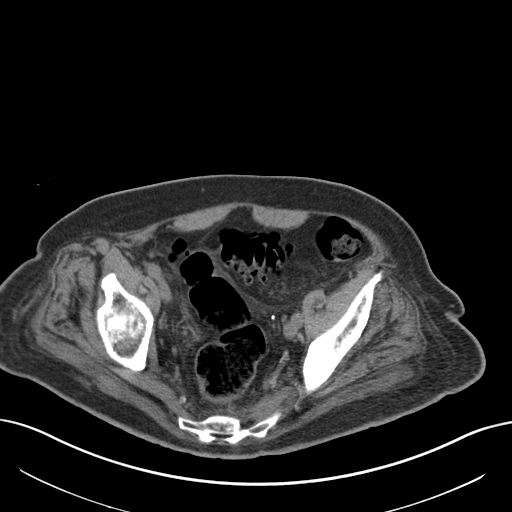
[im 35/103  soft-tissue]
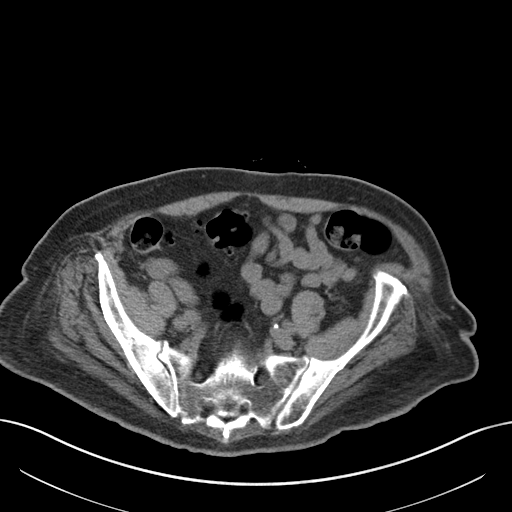
[im 46/103  soft-tissue]
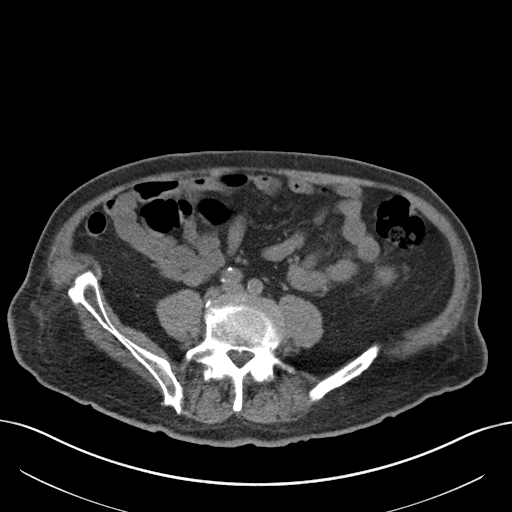
[im 52/103  soft-tissue]
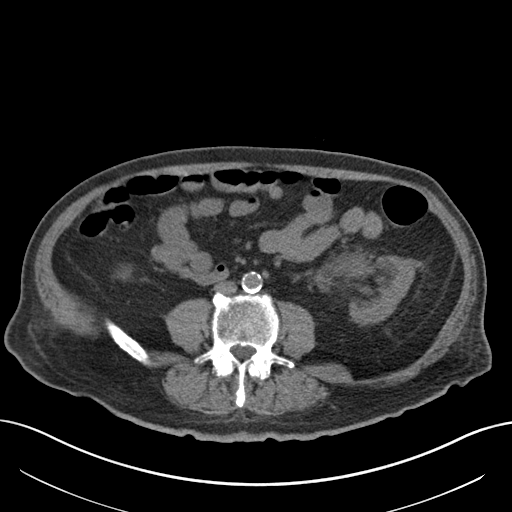
[im 57/103  soft-tissue]
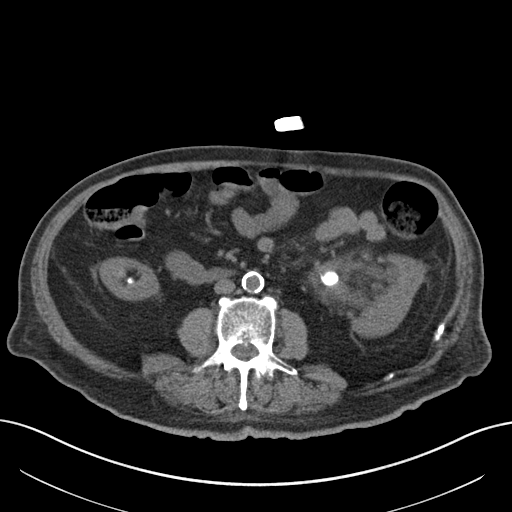
[im 69/103  soft-tissue]
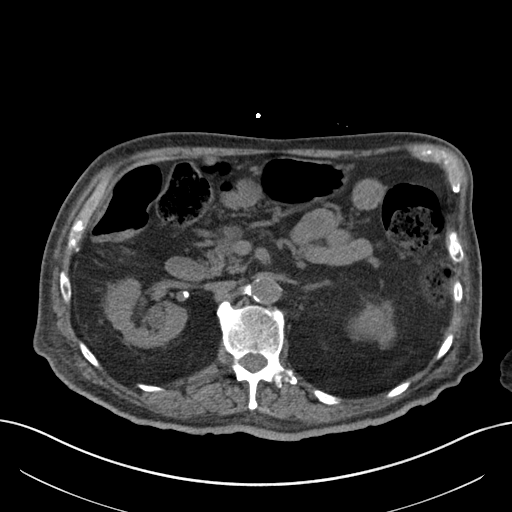
[im 69/103  bone]
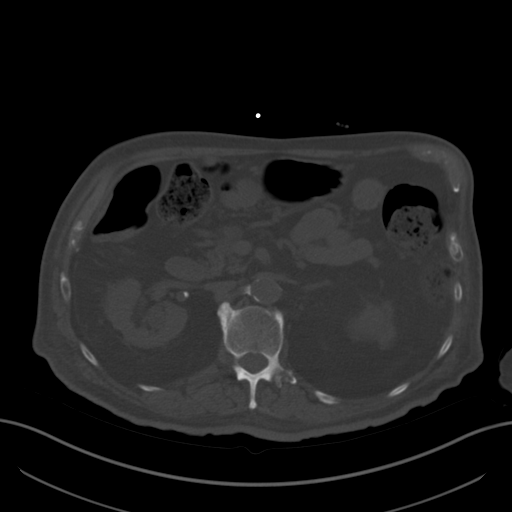
[im 74/103  soft-tissue]
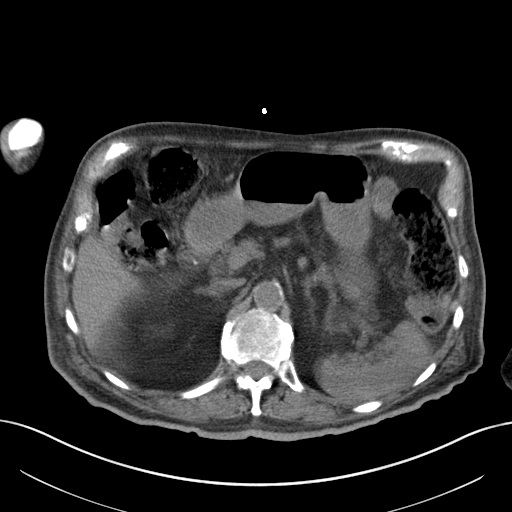
[im 80/103  soft-tissue]
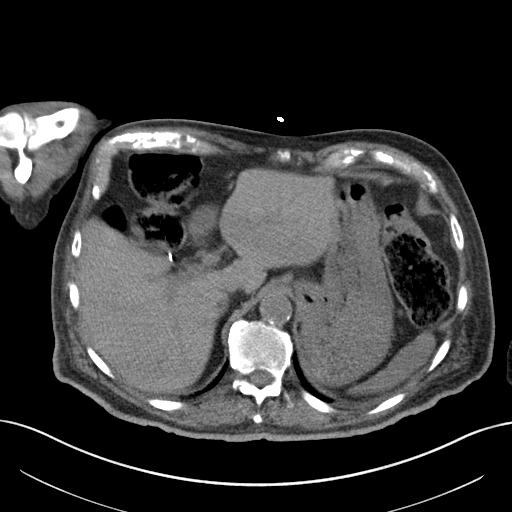
[im 91/103  soft-tissue]
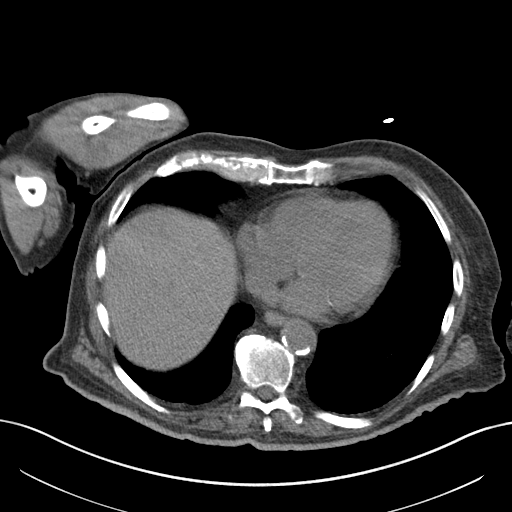
[im 97/103  soft-tissue]
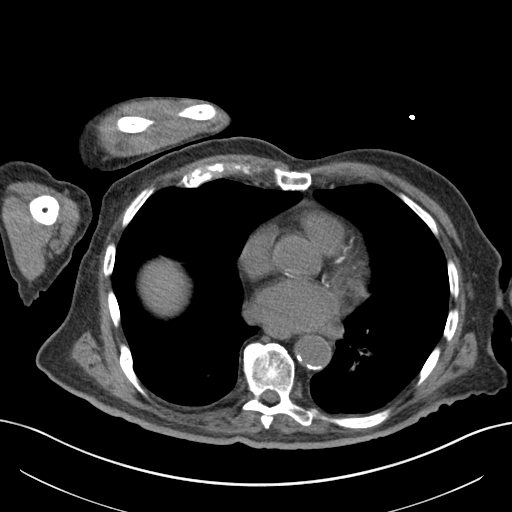

[Series 6: coronal · coronal · 0.78mm/px · 3 of 134 slices shown]
[im 45/134  soft-tissue]
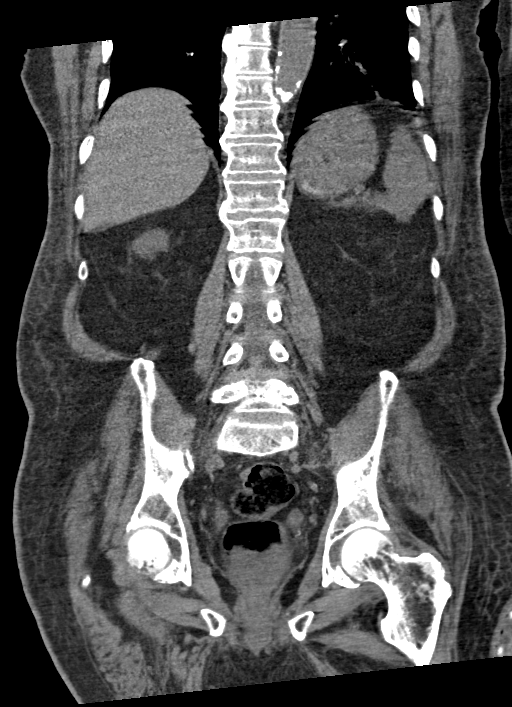
[im 60/134  soft-tissue]
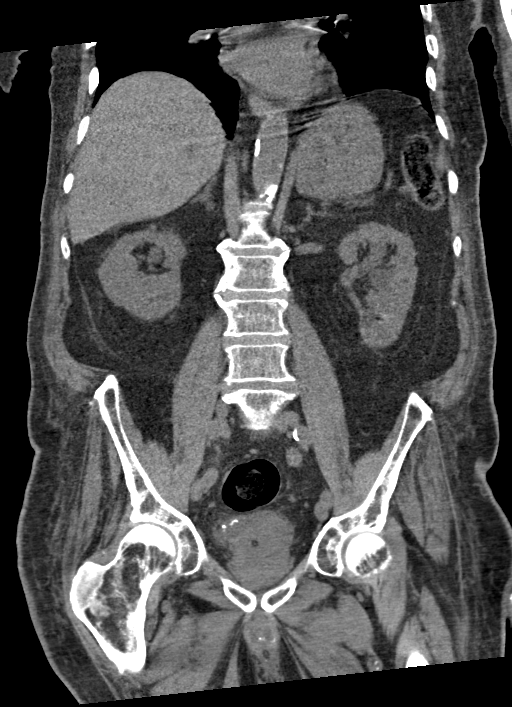
[im 74/134  soft-tissue]
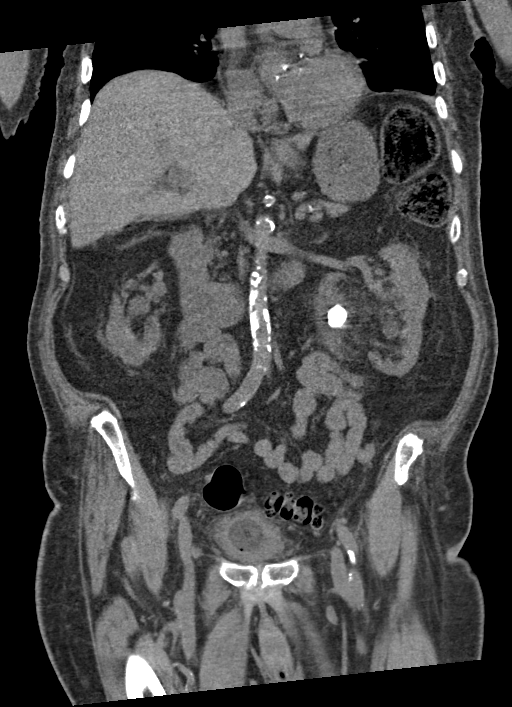

[16 of 46 positions shown; findings below may reference images not displayed]

FINDINGS: LOWER CHEST: Normal.

HEPATOBILIARY: Normal hepatic contours. No intra- or extrahepatic
biliary dilatation. Status post cholecystectomy.

PANCREAS: Normal pancreas. No ductal dilatation or peripancreatic
fluid collection.

SPLEEN: Normal.

ADRENALS/URINARY TRACT: The adrenal glands are normal. 6 mm right
lower pole renal calculus, nonobstructive. At the left ureteropelvic
junction, there is an obstructive calculus measuring 17 mm. There is
left renal pelvic dilatation. There are other nonobstructing left
renal stones that measure to 12 mm. Mild left perinephric stranding.
Decompressed by Foley catheter.

STOMACH/BOWEL: There is no hiatal hernia. Normal duodenal course and
caliber. No small bowel dilatation or inflammation. Rectosigmoid
diverticulosis without acute inflammation. Normal appendix.

VASCULAR/LYMPHATIC: There is calcific atherosclerosis of the
abdominal aorta. No lymphadenopathy.

REPRODUCTIVE: Enlarged prostate measures 5.9 cm in transverse
dimension.

MUSCULOSKELETAL. No bony spinal canal stenosis or focal osseous
abnormality.

OTHER: None.
IMPRESSION: 1. Left-sided obstructive uropathy with 17 mm calculus at the left
ureteropelvic junction and mild left perinephric stranding.
2. Bilateral nonobstructing renal stones measuring up to 12 mm on
the left and 6 mm on the right.
3.  Aortic atherosclerosis ([id number]-T88.8).

## 2021-01-25 ENCOUNTER — Encounter: Payer: Self-pay | Admitting: Orthopedic Surgery

## 2021-01-25 ENCOUNTER — Non-Acute Institutional Stay (SKILLED_NURSING_FACILITY): Payer: Medicare Other | Admitting: Orthopedic Surgery

## 2021-01-25 DIAGNOSIS — R634 Abnormal weight loss: Secondary | ICD-10-CM

## 2021-01-25 DIAGNOSIS — G309 Alzheimer's disease, unspecified: Secondary | ICD-10-CM

## 2021-01-25 DIAGNOSIS — F0150 Vascular dementia without behavioral disturbance: Secondary | ICD-10-CM

## 2021-01-25 DIAGNOSIS — F0280 Dementia in other diseases classified elsewhere without behavioral disturbance: Secondary | ICD-10-CM | POA: Diagnosis not present

## 2021-01-25 NOTE — Progress Notes (Signed)
Location:  [facility] Room Number: 112/A Place of Service:  SNF [phone]) Provider:  Windell Moulding, AGNP-C  Virgie Dad, MD  Patient Care Team: Virgie Dad, MD as PCP - General (Internal Medicine) Danella Sensing, MD as Consulting Physician (Dermatology) Sharyne Peach, MD as Consulting Physician (Ophthalmology) Irine Seal, MD as Attending Physician (Urology) [name], MD as Consulting Physician (Neurology)  Extended Emergency Contact Information Primary Emergency Contact: Connelley,[name] Address: [address]          [place] [address] [name] of [place] Phone: [phone] Relation: Spouse Secondary Emergency Contact: [place] of [place] Mobile Phone: [phone] Relation: Daughter  Code Status: DNR Goals of care: Advanced Directive information Advanced Directives 01/18/2021  Does Patient Have a Medical Advance Directive? Yes  Type of Advance Directive Living will;Out of facility DNR (pink MOST or yellow form)  Does patient want to make changes to medical advance directive? No - Patient declined  Copy of [place] in Chart? -  Would patient like information on creating a medical advance directive? -  Pre-existing out of facility DNR order (yellow form or pink MOST form) -     Chief Complaint  Patient presents with  . Acute Visit    Weight loss    HPI:  Pt is a 87 y.o. male seen today for acute visit for weight loss.   Nursing staff reports decreased po intake with meals and also medications. He has been observed spitting out food and medications. No recent behavioral outbursts. In addition, he is sleeping more throughout the day with some periods of alertness.   Recent weights are as follows:  01/2021- 132.2 lbs  12/2020- 136.6 lbs  11/2020- 130.2 lbs  10/2020- 143.7 lbs  09/2020- 144.0 lbs  Nurse does not report any other concerns, vitals stable.   Past  Medical History:  Diagnosis Date  . Abnormality of gait   . Arthritis   . Brain bleed ([place]) 09/01/2016  . Cervical spondylosis   . Controlled type 2 diabetes mellitus without complication, without long-term current use of insulin ([place]) 06/24/2019  . Degenerative joint disease (DJD) of lumbar spine   . Dementia ([place]) 04/28/2019  . Depression   . Diplopia   . Dyslipidemia   . Essential tremor   . Foul smelling urine   . Frequent falls   . GERD (gastroesophageal reflux disease)   . Glycosuria   . Hearing difficulty    hearing aids  . Hyperlipidemia   . Hypertension   . Hyperthyroidism   . Memory loss   . Osteoarthritis   . Radiculopathy, lumbar region   . 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 Small vessel disease ([place])   . TIA (transient ischemic attack)    Past Surgical History:  Procedure Laterality Date  . APPENDECTOMY     done as a child  . GALLBLADDER SURGERY  2008  . KNEE ARTHROSCOPY Left    Dr. Hart Robinsons 2002  . knee injections Right    Dr. Adriana Mccallum  . TONSILLECTOMY     done as a child    No Known Allergies  Outpatient Encounter Medications as of 01/25/2021  Medication Sig  . amLODipine (NORVASC) 10 MG tablet Take 1 tablet (10 mg total) by mouth daily.  Marland Kitchen ammonium lactate (AMLACTIN) 12 % cream Apply 1 Bottle topically at bedtime.  Marland Kitchen aspirin EC 81  MG tablet Take 81 mg by mouth daily.  . carbidopa-levodopa (SINEMET IR) 25-100 MG tablet Take 1.5 tablets by mouth 3 (three) times daily.  . carboxymethylcellulose (REFRESH PLUS) 0.5 % SOLN Apply 2 drops to eye every 4 (four) hours as needed (dry eyes).  . cefpodoxime (VANTIN) 100 MG tablet Take 100 mg by mouth 2 (two) times daily.  . clopidogrel (PLAVIX) 75 MG tablet Take 75 mg by mouth daily.  . Cranberry 400 MG CAPS Take 2 capsules by mouth 3 (three) times daily.   . hydrALAZINE (APRESOLINE) 25 MG tablet Take 25 mg by mouth daily as needed. SBP>160  . ipratropium-albuterol (DUONEB)  0.5-2.5 (3) MG/3ML SOLN Take 3 mLs by nebulization every 8 (eight) hours as needed.  . Menthol, Topical Analgesic, 4 % GEL Apply 1 application topically 2 (two) times daily as needed (pain).   . ondansetron (ZOFRAN) 4 MG tablet Take 4 mg by mouth every 8 (eight) hours as needed for nausea or vomiting.  Marland Kitchen Ophthalmic Irrigation Solution (OCUSOFT EYE [place] OP) Apply 1 application to eye daily.  . polyethylene glycol (MIRALAX / GLYCOLAX) packet Take 17 g by mouth every other day.  . senna-docusate (SENOKOT-S) 8.6-50 MG tablet Take 2 tablets 2 (two) times daily by mouth.   . sertraline (ZOLOFT) 50 MG tablet Take 50 mg by mouth at bedtime.  Marland Kitchen telmisartan (MICARDIS) 20 MG tablet Take 20 mg by mouth daily.  Marland Kitchen triamcinolone (NASACORT) 55 MCG/ACT AERO nasal inhaler Place 2 sprays into the nose at bedtime.   No facility-administered encounter medications on file as of 01/25/2021.    Review of Systems  Unable to perform ROS: Dementia    Immunization History  Administered Date(s) Administered  . Influenza Inj Mdck Quad Pf 08/09/2017  . Influenza, High Dose Seasonal PF 07/31/2016, 07/31/2019  . Influenza,inj,Quad PF,6+ Mos 08/06/2018  . Influenza-Unspecified 08/01/2010, 07/30/2012, 10/16/2012, 10/16/2013, 08/06/2020  . Moderna Sars-Covid-2 Vaccination 11/18/2019, 02/23/2020, 08/26/2020  . Pneumococcal Polysaccharide-23 02/23/2004, 02/21/2013  . Pneumococcal-Unspecified 03/17/2016  . Td 12/02/2008  . Tdap 09/01/2016  . Zoster 03/28/2006  . Zoster Recombinat (Shingrix) 10/29/2017, 01/24/2018   Pertinent  Health Maintenance Due  Topic Date Due  . HEMOGLOBIN A1C  01/24/2021  . INFLUENZA VACCINE  05/16/2021  . FOOT EXAM  11/22/2021  . PNA vac Low Risk Adult  Completed  . OPHTHALMOLOGY EXAM  Discontinued   Fall Risk  04/09/2020 09/06/2019 01/04/2019 07/17/2018 08/21/2017  Falls in the past year? 0 1 Exclusion - non ambulatory No No  Number falls in past yr: 0 0 - - -  Injury with Fall? 0 0 - - -   Risk for fall due to : Impaired balance/gait History of fall(s) - - -  Follow up Falls evaluation completed Falls evaluation completed - - -   Functional Status Survey:    Vitals:   01/25/21 1410  BP: 126/80  Pulse: 69  Resp: 14  Temp: (!) 97.4 F (36.3 C)  SpO2: 94%  Weight: 132 lb 3.2 oz (60 kg)   Body mass index is 19.81 kg/m. Physical Exam Vitals reviewed.  Constitutional:      General: He is not in acute distress. HENT:     Head: Normocephalic.  Cardiovascular:     Rate and Rhythm: Normal rate and regular rhythm.     Pulses: Normal pulses.     Heart sounds: Normal heart sounds. No murmur heard.   Pulmonary:     Effort: Pulmonary effort is normal. No respiratory distress.  Breath sounds: Normal breath sounds. No wheezing.  Abdominal:     General: Bowel sounds are normal. There is no distension.     Palpations: Abdomen is soft.     Tenderness: There is no abdominal tenderness.  Skin:    General: Skin is warm and dry.     Capillary Refill: Capillary refill takes less than 2 seconds.  Neurological:     General: No focal deficit present.     Mental Status: He is easily aroused. Mental status is at baseline.     Motor: Weakness present.     Gait: Gait abnormal.  Psychiatric:        Attention and Perception: He is inattentive.        Mood and Affect: Mood normal. Affect is flat.        Speech: He is noncommunicative.        Behavior: Behavior normal.        Cognition and Memory: Memory is impaired.     Comments: Moans at times     Labs reviewed: Recent Labs    07/26/20 0000 08/12/20 2211 01/10/21 0000  NA 144 140 140  K 4.0 3.5 4.8  CL 107 106 104  CO2 23* 23 28*  GLUCOSE  --  144*  --   BUN 18 21 22*  CREATININE 0.7 0.84 1.0  CALCIUM 9.7 9.3 9.6   No results for input(s): AST, ALT, ALKPHOS, BILITOT, PROT, ALBUMIN in the last 8760 hours. Recent Labs    08/12/20 2211 01/10/21 0000  WBC 9.3 7.4  NEUTROABS 5.8  --   HGB 11.0* 11.7*  HCT  33.9* 36*  MCV 85.6  --   PLT 303 398   Lab Results  Component Value Date   TSH 3.14 01/18/2018   Lab Results  Component Value Date   HGBA1C 7.4 07/26/2020   Lab Results  Component Value Date   CHOL 127 05/11/2017   HDL 50 05/11/2017   LDLCALC 58 05/11/2017   TRIG 98 05/11/2017    Significant Diagnostic Results in last 30 days:  No results found.  Assessment/Plan 1. Mixed Alzheimer's and vascular dementia ([place]) - no behavioral outbursts, spitting out food and medications, increased somnolence - appears to be in advanced stages of disease  - referral to hospice  2. Weight loss - weight has trended down in past 4 months - he is now spitting out food and meds - referral to hospice    Family/ staff Communication: plan discussed with nurse  Labs/tests ordered:  none

## 2021-01-31 ENCOUNTER — Non-Acute Institutional Stay (SKILLED_NURSING_FACILITY): Payer: Medicare Other | Admitting: Adult Health

## 2021-01-31 ENCOUNTER — Encounter: Payer: Self-pay | Admitting: Adult Health

## 2021-01-31 DIAGNOSIS — M24542 Contracture, left hand: Secondary | ICD-10-CM | POA: Diagnosis not present

## 2021-01-31 DIAGNOSIS — R58 Hemorrhage, not elsewhere classified: Secondary | ICD-10-CM

## 2021-01-31 NOTE — Progress Notes (Signed)
Location:    [place] Room Number: 112 Place of Service:  SNF [phone]) Provider:  Royal Hawthorn, NP  Virgie Dad, MD  Patient Care Team: Virgie Dad, MD as PCP - General (Internal Medicine) Danella Sensing, MD as Consulting Physician (Dermatology) Sharyne Peach, MD as Consulting Physician (Ophthalmology) Irine Seal, MD as Attending Physician (Urology) [name], MD as Consulting Physician (Neurology)  Extended Emergency Contact Information Primary Emergency Contact: Castonguay,[name] Address: [address]          [place] [address] [name] of [place] Phone: [phone] Relation: Spouse Secondary Emergency Contact: [place] of [place] Mobile Phone: [phone] Relation: Daughter  Code Status:  DNR Goals of care: Advanced Directive information Advanced Directives 01/31/2021  Does Patient Have a Medical Advance Directive? Yes  Type of Paramedic of [place];Living will;Out of facility DNR (pink MOST or yellow form)  Does patient want to make changes to medical advance directive? -  Copy of [place] in Chart? Yes - validated most recent copy scanned in chart (See row information)  Would patient like information on creating a medical advance directive? -  Pre-existing out of facility DNR order (yellow form or pink MOST form) Yellow form placed in chart (order not valid for inpatient use)     Chief Complaint  Patient presents with  . Acute Visit    Large bruise to left swollen wrist     HPI:  Pt is a 87 y.o. male seen today for an acute visit for large bruise and swelling to the left wrist.   This was reported on via SBAR from the nurse. The bruising is noted to the left wrist and spreading to the mid forearm. There are no reports of injury or pain. Due to dementia the patient can not elaborate. He is verbal and denies pain. There is also mild  swelling. He has contractures of this hand more so on the left    Past Medical History:  Diagnosis Date  . Abnormality of gait   . Arthritis   . Brain bleed ([place]) 09/01/2016  . Cervical spondylosis   . Controlled type 2 diabetes mellitus without complication, without long-term current use of insulin ([place]) 06/24/2019  . Degenerative joint disease (DJD) of lumbar spine   . Dementia ([place]) 04/28/2019  . Depression   . Diplopia   . Dyslipidemia   . Essential tremor   . Foul smelling urine   . Frequent falls   . GERD (gastroesophageal reflux disease)   . Glycosuria   . Hearing difficulty    hearing aids  . Hyperlipidemia   . Hypertension   . Hyperthyroidism   . Memory loss   . Osteoarthritis   . Radiculopathy, lumbar region   . 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 Small vessel disease ([place])   . TIA (transient ischemic attack)    Past Surgical History:  Procedure Laterality Date  . APPENDECTOMY     done as a child  . GALLBLADDER SURGERY  2008  . KNEE ARTHROSCOPY Left    Dr. Hart Robinsons 2002  . knee injections Right    Dr. Adriana Mccallum  . TONSILLECTOMY     done as a child    No Known Allergies  Allergies as of 01/31/2021   No Known Allergies     Medication List       Accurate as of  January 31, 2021 10:15 AM. If you have any questions, ask your nurse or doctor.        STOP taking these medications   cefpodoxime 100 MG tablet Commonly known as: VANTIN Stopped by: Royal Hawthorn, NP     TAKE these medications   amLODipine 10 MG tablet Commonly known as: NORVASC Take 1 tablet (10 mg total) by mouth daily.   ammonium lactate 12 % cream Commonly known as: AMLACTIN Apply 1 Bottle topically at bedtime.   aspirin EC 81 MG tablet Take 81 mg by mouth daily.   carbidopa-levodopa 25-100 MG tablet Commonly known as: SINEMET IR Take 1.5 tablets by mouth 3 (three) times daily.   carboxymethylcellulose 0.5 % Soln Commonly known as: REFRESH  PLUS Apply 2 drops to eye every 4 (four) hours as needed (dry eyes).   clopidogrel 75 MG tablet Commonly known as: PLAVIX Take 75 mg by mouth daily.   Cranberry 400 MG Caps Take 2 capsules by mouth 3 (three) times daily.   hydrALAZINE 25 MG tablet Commonly known as: APRESOLINE Take 25 mg by mouth daily as needed. SBP>160   ipratropium-albuterol 0.5-2.5 (3) MG/3ML Soln Commonly known as: DUONEB Take 3 mLs by nebulization every 8 (eight) hours as needed.   Menthol (Topical Analgesic) 4 % Gel Apply 1 application topically 2 (two) times daily as needed (pain).   OCUSOFT EYE [place] OP Apply 1 application to eye daily.   ondansetron 4 MG tablet Commonly known as: ZOFRAN Take 4 mg by mouth every 8 (eight) hours as needed for nausea or vomiting.   polyethylene glycol 17 g packet Commonly known as: MIRALAX / GLYCOLAX Take 17 g by mouth every other day.   senna-docusate 8.6-50 MG tablet Commonly known as: Senokot-S Take 2 tablets 2 (two) times daily by mouth.   sertraline 50 MG tablet Commonly known as: ZOLOFT Take 50 mg by mouth at bedtime.   telmisartan 20 MG tablet Commonly known as: MICARDIS Take 20 mg by mouth daily.   triamcinolone 55 MCG/ACT Aero nasal inhaler Commonly known as: NASACORT Place 2 sprays into the nose at bedtime.       Review of Systems  Unable to perform ROS: Dementia    Immunization History  Administered Date(s) Administered  . Influenza Inj Mdck Quad Pf 08/09/2017  . Influenza, High Dose Seasonal PF 07/31/2016, 07/31/2019  . Influenza,inj,Quad PF,6+ Mos 08/06/2018  . Influenza-Unspecified 08/01/2010, 07/30/2012, 10/16/2012, 10/16/2013, 08/06/2020  . Moderna Sars-Covid-2 Vaccination 11/18/2019, 02/23/2020, 08/26/2020  . Pneumococcal Polysaccharide-23 02/23/2004, 02/21/2013  . Pneumococcal-Unspecified 03/17/2016  . Td 12/02/2008  . Tdap 09/01/2016  . Zoster 03/28/2006  . Zoster Recombinat (Shingrix) 10/29/2017, 01/24/2018   Pertinent   Health Maintenance Due  Topic Date Due  . HEMOGLOBIN A1C  01/24/2021  . INFLUENZA VACCINE  05/16/2021  . FOOT EXAM  11/22/2021  . PNA vac Low Risk Adult  Completed  . OPHTHALMOLOGY EXAM  Discontinued   Fall Risk  04/09/2020 09/06/2019 01/04/2019 07/17/2018 08/21/2017  Falls in the past year? 0 1 Exclusion - non ambulatory No No  Number falls in past yr: 0 0 - - -  Injury with Fall? 0 0 - - -  Risk for fall due to : Impaired balance/gait History of fall(s) - - -  Follow up Falls evaluation completed Falls evaluation completed - - -   Functional Status Survey:    Vitals:   01/31/21 1010  BP: 135/67  Pulse: 87  Resp: 16  Temp: (!) 97.5 F (36.4 C)  SpO2: 100%  Weight: 132 lb 3.2 oz (60 kg)  Height: 5' 8.5" (1.74 m)   Body mass index is 19.81 kg/m. Physical Exam Constitutional:      Appearance: Normal appearance.  Musculoskeletal:        General: Swelling (left hand ) present. No tenderness, deformity or signs of injury.     Left forearm: No swelling, edema, deformity, lacerations, tenderness or bony tenderness.     Left wrist: Swelling present. No deformity, effusion, lacerations, tenderness, bony tenderness, snuff box tenderness or crepitus. Decreased range of motion. Normal pulse.     Left hand: Swelling present. No deformity, lacerations, tenderness or bony tenderness. Decreased range of motion. Normal capillary refill. Normal pulse.     Comments: Not able to test strength due to dementia   Skin:    Findings: Bruising (left hand and wrist area spreading to the mid forearm) present.  Neurological:     Mental Status: He is alert.     Labs reviewed: Recent Labs    07/26/20 0000 08/12/20 2211 01/10/21 0000  NA 144 140 140  K 4.0 3.5 4.8  CL 107 106 104  CO2 23* 23 28*  GLUCOSE  --  144*  --   BUN 18 21 22*  CREATININE 0.7 0.84 1.0  CALCIUM 9.7 9.3 9.6   No results for input(s): AST, ALT, ALKPHOS, BILITOT, PROT, ALBUMIN in the last 8760 hours. Recent Labs     08/12/20 2211 01/10/21 0000  WBC 9.3 7.4  NEUTROABS 5.8  --   HGB 11.0* 11.7*  HCT 33.9* 36*  MCV 85.6  --   PLT 303 398   Lab Results  Component Value Date   TSH 3.14 01/18/2018   Lab Results  Component Value Date   HGBA1C 7.4 07/26/2020   Lab Results  Component Value Date   CHOL 127 05/11/2017   HDL 50 05/11/2017   LDLCALC 58 05/11/2017   TRIG 98 05/11/2017    Significant Diagnostic Results in last 30 days:  No results found.  Assessment/Plan  1. Ecchymosis Unclear etiology Will order xray   2. 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

## 2021-02-28 ENCOUNTER — Non-Acute Institutional Stay (SKILLED_NURSING_FACILITY): Admitting: Internal Medicine

## 2021-02-28 ENCOUNTER — Encounter: Payer: Self-pay | Admitting: Internal Medicine

## 2021-02-28 DIAGNOSIS — G214 Vascular parkinsonism: Secondary | ICD-10-CM | POA: Diagnosis not present

## 2021-02-28 DIAGNOSIS — Z978 Presence of other specified devices: Secondary | ICD-10-CM

## 2021-02-28 DIAGNOSIS — R338 Other retention of urine: Secondary | ICD-10-CM

## 2021-02-28 DIAGNOSIS — F0150 Vascular dementia without behavioral disturbance: Secondary | ICD-10-CM

## 2021-02-28 DIAGNOSIS — E119 Type 2 diabetes mellitus without complications: Secondary | ICD-10-CM

## 2021-02-28 DIAGNOSIS — I10 Essential (primary) hypertension: Secondary | ICD-10-CM | POA: Diagnosis not present

## 2021-02-28 DIAGNOSIS — G309 Alzheimer's disease, unspecified: Secondary | ICD-10-CM | POA: Diagnosis not present

## 2021-02-28 DIAGNOSIS — F0280 Dementia in other diseases classified elsewhere without behavioral disturbance: Secondary | ICD-10-CM

## 2021-02-28 DIAGNOSIS — N401 Enlarged prostate with lower urinary tract symptoms: Secondary | ICD-10-CM

## 2021-02-28 NOTE — Progress Notes (Signed)
Location:  Occupational psychologist of Service:  SNF (31)  Provider:   Code Status: DNR/ Hospice Goals of Care:  Advanced Directives 01/31/2021  Does Patient Have a Medical Advance Directive? Yes  Type of Paramedic of [place];Living will;Out of facility DNR (pink MOST or yellow form)  Does patient want to make changes to medical advance directive? -  Copy of [place] in Chart? Yes - validated most recent copy scanned in chart (See row information)  Would patient like information on creating a medical advance directive? -  Pre-existing out of facility DNR order (yellow form or pink MOST form) Yellow form placed in chart (order not valid for inpatient use)     Chief Complaint  Patient presents with  . Medical Management of Chronic Issues    HPI: Patient is a 87 y.o. male seen today for medical management of chronic diseases.    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 Was sleeping initially but then was responding Does not follow Commands but does responds to his name Has lost some weight  Enrolled in Hospice   Past Medical History:  Diagnosis Date  . Abnormality of gait   . Arthritis   . Brain bleed ([place]) 09/01/2016  . Cervical spondylosis   . Controlled type 2 diabetes mellitus without complication, without long-term current use of insulin ([place]) 06/24/2019  . Degenerative joint disease (DJD) of lumbar spine   . Dementia ([place]) 04/28/2019  . Depression   . Diplopia   . Dyslipidemia   . Essential tremor   . Foul smelling urine   . Frequent falls   . GERD (gastroesophageal reflux disease)   . Glycosuria   . Hearing difficulty    hearing aids  . Hyperlipidemia   . Hypertension    . Hyperthyroidism   . Memory loss   . Osteoarthritis   . Radiculopathy, lumbar region   . 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 Small vessel disease ([place])   . TIA (transient ischemic attack)     Past Surgical History:  Procedure Laterality Date  . APPENDECTOMY     done as a child  . GALLBLADDER SURGERY  2008  . KNEE ARTHROSCOPY Left    Dr. Hart Robinsons 2002  . knee injections Right    Dr. Adriana Mccallum  . TONSILLECTOMY     done as a child    No Known Allergies  Outpatient Encounter Medications as of 02/28/2021  Medication Sig  . amLODipine (NORVASC) 10 MG tablet Take 1 tablet (10 mg total) by mouth daily.  Marland Kitchen ammonium lactate (AMLACTIN) 12 % cream Apply 1 Bottle topically at bedtime.  Marland Kitchen aspirin EC 81 MG tablet Take 81 mg by mouth daily.  . carbidopa-levodopa (SINEMET IR) 25-100 MG tablet Take 1.5 tablets by mouth 3 (three) times daily.  . carboxymethylcellulose (REFRESH PLUS) 0.5 % SOLN Apply 2 drops to eye every 4 (four) hours as needed (dry eyes).  . clopidogrel (PLAVIX) 75 MG tablet Take 75 mg by mouth daily.  . Cranberry 400 MG CAPS Take 2 capsules by mouth 3 (three) times daily.   . hydrALAZINE (APRESOLINE) 25 MG tablet Take 25 mg by mouth  daily as needed. SBP>160  . ipratropium-albuterol (DUONEB) 0.5-2.5 (3) MG/3ML SOLN Take 3 mLs by nebulization every 8 (eight) hours as needed.  . Menthol, Topical Analgesic, 4 % GEL Apply 1 application topically 2 (two) times daily as needed (pain).   . ondansetron (ZOFRAN) 4 MG tablet Take 4 mg by mouth every 8 (eight) hours as needed for nausea or vomiting.  Marland Kitchen Ophthalmic Irrigation Solution (OCUSOFT EYE [place] OP) Apply 1 application to eye daily.  . polyethylene glycol (MIRALAX / GLYCOLAX) packet Take 17 g by mouth every other day.  . senna-docusate (SENOKOT-S) 8.6-50 MG tablet Take 2 tablets 2 (two) times daily by mouth.   . sertraline (ZOLOFT) 50 MG tablet Take 50 mg by mouth at bedtime.  Marland Kitchen telmisartan  (MICARDIS) 20 MG tablet Take 20 mg by mouth daily.  Marland Kitchen triamcinolone (NASACORT) 55 MCG/ACT AERO nasal inhaler Place 2 sprays into the nose at bedtime.   No facility-administered encounter medications on file as of 02/28/2021.    Review of Systems:  Review of Systems  Unable to perform ROS: Dementia  No Acute issues per Nurses  Health Maintenance  Topic Date Due  . HEMOGLOBIN A1C  01/24/2021  . INFLUENZA VACCINE  05/16/2021  . FOOT EXAM  11/22/2021  . TETANUS/TDAP  09/01/2026  . COVID-19 Vaccine  Completed  . PNA vac Low Risk Adult  Completed  . HPV VACCINES  Aged Out  . OPHTHALMOLOGY EXAM  Discontinued    Physical Exam: Vitals:   02/28/21 1154  BP: 138/83  Pulse: 68  Resp: 15  Temp: (!) 97.2 F (36.2 C)  Weight: 132 lb (59.9 kg)   Body mass index is 19.78 kg/m. Physical Exam Vitals reviewed.  Constitutional:      Appearance: Normal appearance.  HENT:     Head: Normocephalic.     Nose: Nose normal.     Mouth/Throat:     Mouth: Mucous membranes are moist.     Pharynx: Oropharynx is clear.  Eyes:     Pupils: Pupils are equal, round, and reactive to light.  Cardiovascular:     Rate and Rhythm: Normal rate and regular rhythm.     Pulses: Normal pulses.     Heart sounds: Normal heart sounds.  Pulmonary:     Effort: Pulmonary effort is normal.     Breath sounds: Normal breath sounds.  Abdominal:     General: Abdomen is flat. Bowel sounds are normal.     Palpations: Abdomen is soft.  Musculoskeletal:        General: No swelling.     Cervical back: Neck supple.  Skin:    General: Skin is warm.  Neurological:     Mental Status: He is alert.     Comments: Has some contractures in his Hand with stiffness Does respond to his name but does not follow any commands     Labs reviewed: Basic Metabolic Panel: Recent Labs    07/26/20 0000 08/12/20 2211 01/10/21 0000  NA 144 140 140  K 4.0 3.5 4.8  CL 107 106 104  CO2 23* 23 28*  GLUCOSE  --  144*  --   BUN  18 21 22*  CREATININE 0.7 0.84 1.0  CALCIUM 9.7 9.3 9.6   Liver Function Tests: No results for input(s): AST, ALT, ALKPHOS, BILITOT, PROT, ALBUMIN in the last 8760 hours. No results for input(s): LIPASE, AMYLASE in the last 8760 hours. No results for input(s): AMMONIA in the last 8760 hours. CBC: Recent Labs  08/12/20 2211 01/10/21 0000  WBC 9.3 7.4  NEUTROABS 5.8  --   HGB 11.0* 11.7*  HCT 33.9* 36*  MCV 85.6  --   PLT 303 398   Lipid Panel: No results for input(s): CHOL, HDL, LDLCALC, TRIG, CHOLHDL, LDLDIRECT in the last 8760 hours. Lab Results  Component Value Date   HGBA1C 7.4 07/26/2020    Procedures since last visit: No results found.  Assessment/Plan 1. Mixed Alzheimer's and vascular dementia ([place]) Supportive care Off [place] due to weight loss  2. 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 Benign prostatic hyperplasia with urinary retention Has Chronic Foley cathter  4. Essential hypertension On Norvasc and Cozaar  5. Chronic indwelling Foley catheter Supportive care 6. Controlled type 2 diabetes mellitus without complication, without long-term current use of insulin ([facility]) No Labs  No Meds   7. ? TIA On DPAT per Dr Jannifer Franklin Will discontinue Aspirin if [facility] with POA as he is enrolled in Hospice now and has issues with ecchymosis 8 Pressure Wounds Right Foot has abrasion Sacral are has superficial stage 2 Using Foam Dressing  Labs/tests ordered:  * No order type specified * Next appt:  Visit date not found

## 2021-03-22 DIAGNOSIS — R3914 Feeling of incomplete bladder emptying: Secondary | ICD-10-CM | POA: Diagnosis not present

## 2021-03-22 DIAGNOSIS — Z743 Need for continuous supervision: Secondary | ICD-10-CM | POA: Diagnosis not present

## 2021-03-22 DIAGNOSIS — R404 Transient alteration of awareness: Secondary | ICD-10-CM | POA: Diagnosis not present

## 2021-03-22 DIAGNOSIS — R6889 Other general symptoms and signs: Secondary | ICD-10-CM | POA: Diagnosis not present

## 2021-03-22 DIAGNOSIS — T83098D Other mechanical complication of other indwelling urethral catheter, subsequent encounter: Secondary | ICD-10-CM | POA: Diagnosis not present

## 2021-03-22 DIAGNOSIS — R5381 Other malaise: Secondary | ICD-10-CM | POA: Diagnosis not present

## 2021-03-24 ENCOUNTER — Encounter: Payer: Self-pay | Admitting: Adult Health

## 2021-03-24 ENCOUNTER — Non-Acute Institutional Stay (SKILLED_NURSING_FACILITY): Payer: Medicare Other | Admitting: Adult Health

## 2021-03-24 DIAGNOSIS — N401 Enlarged prostate with lower urinary tract symptoms: Secondary | ICD-10-CM

## 2021-03-24 DIAGNOSIS — G309 Alzheimer's disease, unspecified: Secondary | ICD-10-CM

## 2021-03-24 DIAGNOSIS — Z978 Presence of other specified devices: Secondary | ICD-10-CM | POA: Diagnosis not present

## 2021-03-24 DIAGNOSIS — R338 Other retention of urine: Secondary | ICD-10-CM

## 2021-03-24 DIAGNOSIS — F0280 Dementia in other diseases classified elsewhere without behavioral disturbance: Secondary | ICD-10-CM

## 2021-03-24 DIAGNOSIS — I10 Essential (primary) hypertension: Secondary | ICD-10-CM

## 2021-03-24 DIAGNOSIS — R634 Abnormal weight loss: Secondary | ICD-10-CM | POA: Diagnosis not present

## 2021-03-24 DIAGNOSIS — F0150 Vascular dementia without behavioral disturbance: Secondary | ICD-10-CM

## 2021-03-24 NOTE — Progress Notes (Signed)
Location:   [facility] Room Number: 112 Place of Service:  SNF [phone]) Provider:  Royal Hawthorn, NP  Virgie Dad, MD  Patient Care Team: Virgie Dad, MD as PCP - General (Internal Medicine) Danella Sensing, MD as Consulting Physician (Dermatology) Sharyne Peach, MD as Consulting Physician (Ophthalmology) Irine Seal, MD as Attending Physician (Urology) [name], MD as Consulting Physician (Neurology)  Extended Emergency Contact Information Primary Emergency Contact: Gladson,[name] Address: [address]          [place] [address] [name] of [place] Phone: [phone] Relation: Spouse Secondary Emergency Contact: [place] of [place] Mobile Phone: [phone] Relation: Daughter  Code Status:  DNR Hospice Goals of care: Advanced Directive information Advanced Directives 03/24/2021  Does Patient Have a Medical Advance Directive? Yes  Type of Paramedic of [place];Living will;Out of facility DNR (pink MOST or yellow form)  Does patient want to make changes to medical advance directive? No - Patient declined  Copy of [place] in Chart? Yes - validated most recent copy scanned in chart (See row information)  Would patient like information on creating a medical advance directive? -  Pre-existing out of facility DNR order (yellow form or pink MOST form) Yellow form placed in chart (order not valid for inpatient use)     Chief Complaint  Patient presents with   Medical Management of Chronic Issues    #4 Covid, Hemoglobin A1C, PCV    HPI:  Pt is a 87 y.o. male seen today for medical management of chronic diseases.   PMH significant for mixed dementia, TIA, BPH with indwelling foley, htn, brain bleed, DM II, depression, parkinsonism, weight loss, dysphagia, dyslipidemia, cerebrovascular disease and recurrent UTI.   I discussed his care with the nurse  and there are no acute concerns.  Foley is draining without urinary symptoms He is followed by hospice and continues to decline both cognitively and with oral intake. Continues on a D3 chopped diet and tolerating well. Able to swallow pills crushed. Weight down 3 lbs from last month.  He had a wound to his sacrum which has healed.  Wt Readings from Last 3 Encounters:  03/24/21 129 lb 6.4 oz (58.7 kg)  02/28/21 132 lb (59.9 kg)  01/31/21 132 lb 3.2 oz (60 kg)     Past Medical History:  Diagnosis Date   Abnormality of gait    Arthritis    Brain bleed ([place]) 09/01/2016   Cervical spondylosis    Controlled type 2 diabetes mellitus without complication, without long-term current use of insulin ([place]) 06/24/2019   Degenerative joint disease (DJD) of lumbar spine    Dementia ([place]) 04/28/2019   Depression    Diplopia    Dyslipidemia    Essential tremor    Foul smelling urine    Frequent falls    GERD (gastroesophageal reflux disease)    Glycosuria    Hearing difficulty    hearing aids   Hyperlipidemia    Hypertension    Hyperthyroidism    Memory loss    Osteoarthritis    Radiculopathy, lumbar region    Sixth nerve palsy    last  left brain 11/2006  02/1998 08/2002   Small vessel disease ([place])    TIA (transient ischemic attack)    Past Surgical History:  Procedure Laterality Date   APPENDECTOMY     done as a child   GALLBLADDER SURGERY  2008   KNEE ARTHROSCOPY  Left    Dr. Hart Robinsons 2002   knee injections Right    Dr. Adriana Mccallum   TONSILLECTOMY     done as a child    No Known Allergies  Allergies as of 03/24/2021   No Known Allergies      Medication List        Accurate as of March 24, 2021 10:09 AM. If you have any questions, ask your nurse or doctor.          STOP taking these medications    aspirin EC 81 MG tablet Stopped by: Royal Hawthorn, NP       TAKE these medications    amLODipine 10 MG tablet Commonly known as: NORVASC Take 1 tablet (10 mg  total) by mouth daily.   ammonium lactate 12 % cream Commonly known as: AMLACTIN Apply 1 Bottle topically at bedtime.   carbidopa-levodopa 25-100 MG tablet Commonly known as: SINEMET IR Take 1.5 tablets by mouth 3 (three) times daily.   carboxymethylcellulose 0.5 % Soln Commonly known as: REFRESH PLUS Apply 2 drops to eye every 4 (four) hours as needed (dry eyes).   clopidogrel 75 MG tablet Commonly known as: PLAVIX Take 75 mg by mouth daily.   Cranberry 400 MG Caps Take 2 capsules by mouth 3 (three) times daily.   hydrALAZINE 25 MG tablet Commonly known as: APRESOLINE Take 25 mg by mouth daily as needed. SBP>160   ipratropium-albuterol 0.5-2.5 (3) MG/3ML Soln Commonly known as: DUONEB Take 3 mLs by nebulization every 8 (eight) hours as needed.   lidocaine 2 % jelly Commonly known as: XYLOCAINE 1 application as needed. To tip of penis before cath care.   Menthol (Topical Analgesic) 4 % Gel Apply 1 application topically 2 (two) times daily as needed (pain).   OCUSOFT EYE [place] OP Apply 1 application to eye daily.   ondansetron 4 MG tablet Commonly known as: ZOFRAN Take 4 mg by mouth every 8 (eight) hours as needed for nausea or vomiting.   polyethylene glycol 17 g packet Commonly known as: MIRALAX / GLYCOLAX Take 17 g by mouth every other day.   senna-docusate 8.6-50 MG tablet Commonly known as: Senokot-S Take 2 tablets 2 (two) times daily by mouth.   sertraline 50 MG tablet Commonly known as: ZOLOFT Take 50 mg by mouth at bedtime.   telmisartan 20 MG tablet Commonly known as: MICARDIS Take 20 mg by mouth daily.   triamcinolone 55 MCG/ACT Aero nasal inhaler Commonly known as: NASACORT Place 2 sprays into the nose at bedtime.        Review of Systems  Unable to perform ROS: Dementia   Immunization History  Administered Date(s) Administered   Influenza Inj Mdck Quad Pf 08/09/2017   Influenza, High Dose Seasonal PF 07/31/2016, 07/31/2019    Influenza,inj,Quad PF,6+ Mos 08/06/2018   Influenza-Unspecified 08/01/2010, 07/30/2012, 10/16/2012, 10/16/2013, 08/06/2020   Moderna Sars-Covid-2 Vaccination 11/18/2019, 02/23/2020, 08/26/2020   Pneumococcal Polysaccharide-23 02/23/2004, 02/21/2013   Pneumococcal-Unspecified 03/17/2016   Td 12/02/2008   Tdap 09/01/2016   Zoster Recombinat (Shingrix) 10/29/2017, 01/24/2018   Zoster, Live 03/28/2006   Pertinent  Health Maintenance Due  Topic Date Due   HEMOGLOBIN A1C  01/24/2021   INFLUENZA VACCINE  05/16/2021   FOOT EXAM  11/22/2021   PNA vac Low Risk Adult  Completed   OPHTHALMOLOGY EXAM  Discontinued   Fall Risk  04/09/2020 09/06/2019 01/04/2019 07/17/2018 08/21/2017  Falls in the past year? 0 1 Exclusion - non ambulatory No No  Number falls in past yr: 0 0 - - -  Injury with Fall? 0 0 - - -  Risk for fall due to : Impaired balance/gait History of fall(s) - - -  Follow up Falls evaluation completed Falls evaluation completed - - -   Functional Status Survey:    Vitals:   03/24/21 0952  BP: (!) 159/71  Pulse: 62  Resp: 15  Temp: 98 F (36.7 C)  SpO2: 97%  Weight: 129 lb 6.4 oz (58.7 kg)  Height: 5' 8.5" (1.74 m)   Body mass index is 19.39 kg/m. Physical Exam Vitals and nursing note reviewed.  Constitutional:      General: He is not in acute distress.    Appearance: He is not diaphoretic.     Comments: Frail thin  HENT:     Head: Normocephalic and atraumatic.  Neck:     Thyroid: No thyromegaly.     Vascular: No JVD.     Trachea: No tracheal deviation.  Cardiovascular:     Rate and Rhythm: Normal rate and regular rhythm.     Heart sounds: No murmur heard. Pulmonary:     Effort: Pulmonary effort is normal. No respiratory distress.     Breath sounds: Normal breath sounds. No wheezing.  Abdominal:     General: Bowel sounds are normal. There is no distension.     Palpations: Abdomen is soft.     Tenderness: There is no abdominal tenderness.  Musculoskeletal:      Cervical back: Normal range of motion and neck supple.     Right lower leg: No edema.     Left lower leg: No edema.  Lymphadenopathy:     Cervical: No cervical adenopathy.  Skin:    General: Skin is warm and dry.  Neurological:     Mental Status: He is alert. Mental status is at baseline.     Comments: Left arm with contracture.   Psychiatric:        Mood and Affect: Mood normal.    Labs reviewed: Recent Labs    07/26/20 0000 08/12/20 2211 01/10/21 0000  NA 144 140 140  K 4.0 3.5 4.8  CL 107 106 104  CO2 23* 23 28*  GLUCOSE  --  144*  --   BUN 18 21 22*  CREATININE 0.7 0.84 1.0  CALCIUM 9.7 9.3 9.6   No results for input(s): AST, ALT, ALKPHOS, BILITOT, PROT, ALBUMIN in the last 8760 hours. Recent Labs    08/12/20 2211 01/10/21 0000  WBC 9.3 7.4  NEUTROABS 5.8  --   HGB 11.0* 11.7*  HCT 33.9* 36*  MCV 85.6  --   PLT 303 398   Lab Results  Component Value Date   TSH 3.14 01/18/2018   Lab Results  Component Value Date   HGBA1C 7.4 07/26/2020   Lab Results  Component Value Date   CHOL 127 05/11/2017   HDL 50 05/11/2017   LDLCALC 58 05/11/2017   TRIG 98 05/11/2017    Significant Diagnostic Results in last 30 days:  No results found.  Assessment/Plan  1. Weight loss Due to progressive dementia Appreciate hospice support  2. Mixed Alzheimer's and vascular dementia ([place]) Severe stage No longer on memory meds Continue supportive care in the skilled environment   3. Essential hypertension Controlled Continue Norvasc and micardis  4. Benign prostatic hyperplasia with urinary retention Led to a chronic foley which has increased his risk for recurrent UTI. His wife has still wanted [place] to  treat easily reversible infections in the past  5. Chronic indwelling Foley catheter Draining well Necessary due to inability to empty the bladder  Labs/tests ordered:  hold off on A1C due to hospice status. He is not due for any vaccines today. [facility]id [facility]  [facility]

## 2021-03-25 ENCOUNTER — Encounter: Payer: Self-pay | Admitting: Adult Health

## 2021-04-20 DIAGNOSIS — Z743 Need for continuous supervision: Secondary | ICD-10-CM | POA: Diagnosis not present

## 2021-04-20 DIAGNOSIS — Z7401 Bed confinement status: Secondary | ICD-10-CM | POA: Diagnosis not present

## 2021-04-20 DIAGNOSIS — R404 Transient alteration of awareness: Secondary | ICD-10-CM | POA: Diagnosis not present

## 2021-04-20 DIAGNOSIS — R531 Weakness: Secondary | ICD-10-CM | POA: Diagnosis not present

## 2021-04-21 ENCOUNTER — Encounter: Payer: Self-pay | Admitting: Adult Health

## 2021-04-21 ENCOUNTER — Non-Acute Institutional Stay (SKILLED_NURSING_FACILITY): Payer: Medicare Other | Admitting: Adult Health

## 2021-04-21 DIAGNOSIS — R1312 Dysphagia, oropharyngeal phase: Secondary | ICD-10-CM | POA: Diagnosis not present

## 2021-04-21 DIAGNOSIS — Z978 Presence of other specified devices: Secondary | ICD-10-CM

## 2021-04-21 DIAGNOSIS — R338 Other retention of urine: Secondary | ICD-10-CM | POA: Diagnosis not present

## 2021-04-21 DIAGNOSIS — F0150 Vascular dementia without behavioral disturbance: Secondary | ICD-10-CM

## 2021-04-21 DIAGNOSIS — F0280 Dementia in other diseases classified elsewhere without behavioral disturbance: Secondary | ICD-10-CM

## 2021-04-21 DIAGNOSIS — I10 Essential (primary) hypertension: Secondary | ICD-10-CM | POA: Diagnosis not present

## 2021-04-21 DIAGNOSIS — G309 Alzheimer's disease, unspecified: Secondary | ICD-10-CM | POA: Diagnosis not present

## 2021-04-21 DIAGNOSIS — L89152 Pressure ulcer of sacral region, stage 2: Secondary | ICD-10-CM

## 2021-04-21 DIAGNOSIS — N401 Enlarged prostate with lower urinary tract symptoms: Secondary | ICD-10-CM

## 2021-04-21 DIAGNOSIS — R634 Abnormal weight loss: Secondary | ICD-10-CM

## 2021-04-21 NOTE — Progress Notes (Signed)
Location:   [facility] Room Number: 112 Place of Service:  SNF [phone]) Provider:  Royal Hawthorn, NP  Virgie Dad, MD  Patient Care Team: Virgie Dad, MD as PCP - General (Internal Medicine) Danella Sensing, MD as Consulting Physician (Dermatology) Sharyne Peach, MD as Consulting Physician (Ophthalmology) Irine Seal, MD as Attending Physician (Urology) [name], MD as Consulting Physician (Neurology)  Extended Emergency Contact Information Primary Emergency Contact: Padula,[name] Address: [address]          [place] [address] [name] of [place] Phone: [phone] Relation: Spouse Secondary Emergency Contact: [place] of [place] Mobile Phone: [phone] Relation: Daughter  Code Status:  DNR Hospice Goals of care: Advanced Directive information Advanced Directives 04/21/2021  Does Patient Have a Medical Advance Directive? Yes  Type of Paramedic of [place];Living will;Out of facility DNR (pink MOST or yellow form)  Does patient want to make changes to medical advance directive? No - Patient declined  Copy of [place] in Chart? Yes - validated most recent copy scanned in chart (See row information)  Would patient like information on creating a medical advance directive? -  Pre-existing out of facility DNR order (yellow form or pink MOST form) Yellow form placed in chart (order not valid for inpatient use)     Chief Complaint  Patient presents with   Medical Management of Chronic Issues    HPI:  Pt is a 87 y.o. male seen today for medical management of chronic diseases.   PMH significant for mixed dementia, TIA, BPH with indwelling foley, htn, brain bleed, DM II, depression, parkinsonism, weight loss, dysphagia, dyslipidemia, cerebrovascular disease and recurrent UTI.   His weight continues to decreased and his intake is less. Has lost 15  lbs since March. Glucerna is ordered. No coughing at meals or choking. Sleeping more.   Nurse reports pressure injury to coccyx area is healing. Wears boots to both feet to prevent pressure injury  Bp systolic [phone]  Saw urology on 7/6 with no new orders. Has a hx of foley, hypospadias, renal stones, and recurrent UTI. Does not currently have symptoms.   Has some issues per nurse with rigidity and has a left hand contracture.   Past Medical History:  Diagnosis Date   Abnormality of gait    Arthritis    Brain bleed ([place]) 09/01/2016   Cervical spondylosis    Controlled type 2 diabetes mellitus without complication, without long-term current use of insulin ([place]) 06/24/2019   Degenerative joint disease (DJD) of lumbar spine    Dementia ([place]) 04/28/2019   Depression    Diplopia    Dyslipidemia    Essential tremor    Foul smelling urine    Frequent falls    GERD (gastroesophageal reflux disease)    Glycosuria    Hearing difficulty    hearing aids   Hyperlipidemia    Hypertension    Hyperthyroidism    Memory loss    Osteoarthritis    Radiculopathy, lumbar region    Sixth nerve palsy    last  left brain 11/2006  02/1998 08/2002   Small vessel disease ([place])    TIA (transient ischemic attack)    Past Surgical History:  Procedure Laterality Date   APPENDECTOMY     done as a child   GALLBLADDER SURGERY  2008   KNEE ARTHROSCOPY Left    Dr. Hart Robinsons 2002   knee injections Right  Dr. Adriana Mccallum   TONSILLECTOMY     done as a child    No Known Allergies  Allergies as of 04/21/2021   No Known Allergies      Medication List        Accurate as of April 21, 2021  3:52 PM. If you have any questions, ask your nurse or doctor.          amLODipine 10 MG tablet Commonly known as: NORVASC Take 1 tablet (10 mg total) by mouth daily.   ammonium lactate 12 % cream Commonly known as: AMLACTIN Apply 1 Bottle topically at bedtime.   carbidopa-levodopa 25-100 MG  tablet Commonly known as: SINEMET IR Take 1.5 tablets by mouth 3 (three) times daily.   carboxymethylcellulose 0.5 % Soln Commonly known as: REFRESH PLUS Apply 2 drops to eye every 4 (four) hours as needed (dry eyes).   clopidogrel 75 MG tablet Commonly known as: PLAVIX Take 75 mg by mouth daily.   Cranberry 400 MG Caps Take 2 capsules by mouth 3 (three) times daily.   hydrALAZINE 25 MG tablet Commonly known as: APRESOLINE Take 25 mg by mouth daily as needed. SBP>160   ipratropium-albuterol 0.5-2.5 (3) MG/3ML Soln Commonly known as: DUONEB Take 3 mLs by nebulization every 8 (eight) hours as needed.   lidocaine 2 % jelly Commonly known as: XYLOCAINE 1 application as needed. To tip of penis before cath care.   Menthol (Topical Analgesic) 4 % Gel Apply 1 application topically 2 (two) times daily as needed (pain).   OCUSOFT EYE [place] OP Apply 1 application to eye daily.   ondansetron 4 MG tablet Commonly known as: ZOFRAN Take 4 mg by mouth every 8 (eight) hours as needed for nausea or vomiting.   polyethylene glycol 17 g packet Commonly known as: MIRALAX / GLYCOLAX Take 17 g by mouth every other day.   PreviDent 1.1 % Gel dental gel Generic drug: sodium fluoride Place 1 application onto teeth at bedtime.   senna-docusate 8.6-50 MG tablet Commonly known as: Senokot-S Take 2 tablets 2 (two) times daily by mouth.   sertraline 50 MG tablet Commonly known as: ZOLOFT Take 50 mg by mouth at bedtime.   telmisartan 20 MG tablet Commonly known as: MICARDIS Take 20 mg by mouth daily.   triamcinolone 55 MCG/ACT Aero nasal inhaler Commonly known as: NASACORT Place 2 sprays into the nose at bedtime.        Review of Systems  Unable to perform ROS: Dementia   Immunization History  Administered Date(s) Administered   Influenza Inj Mdck Quad Pf 08/09/2017   Influenza, High Dose Seasonal PF 07/31/2016, 07/31/2019   Influenza,inj,Quad PF,6+ Mos 08/06/2018    Influenza-Unspecified 08/01/2010, 07/30/2012, 10/16/2012, 10/16/2013, 08/06/2020   Moderna Sars-Covid-2 Vaccination 11/18/2019, 02/23/2020, 08/26/2020   Pneumococcal Polysaccharide-23 02/23/2004, 02/21/2013   Pneumococcal-Unspecified 03/17/2016   Td 12/02/2008   Tdap 09/01/2016   Zoster Recombinat (Shingrix) 10/29/2017, 01/24/2018   Zoster, Live 03/28/2006   Pertinent  Health Maintenance Due  Topic Date Due   HEMOGLOBIN A1C  04/24/2021 (Originally 01/24/2021)   INFLUENZA VACCINE  05/16/2021   FOOT EXAM  11/22/2021   PNA vac Low Risk Adult  Completed   OPHTHALMOLOGY EXAM  Discontinued   Fall Risk  04/09/2020 09/06/2019 01/04/2019 07/17/2018 08/21/2017  Falls in the past year? 0 1 Exclusion - non ambulatory No No  Number falls in past yr: 0 0 - - -  Injury with Fall? 0 0 - - -  Risk for fall  due to : Impaired balance/gait History of fall(s) - - -  Follow up Falls evaluation completed Falls evaluation completed - - -   Functional Status Survey:    Vitals:   04/21/21 1453  BP: (!) 147/68  Pulse: 65  Resp: 16  Temp: (!) 97 F (36.1 C)  SpO2: 94%  Weight: 121 lb 12.8 oz (55.2 kg)  Height: 5' 8.5" (1.74 m)   Body mass index is 18.25 kg/m. Physical Exam Vitals and nursing note reviewed.  Constitutional:      General: He is not in acute distress.    Appearance: He is not diaphoretic.     Comments: Frail thin  HENT:     Head: Normocephalic and atraumatic.     Mouth/Throat:     Mouth: Mucous membranes are moist.     Pharynx: Oropharynx is clear.  Neck:     Thyroid: No thyromegaly.     Vascular: No JVD.     Trachea: No tracheal deviation.  Cardiovascular:     Rate and Rhythm: Normal rate and regular rhythm.     Heart sounds: No murmur heard. Pulmonary:     Effort: Pulmonary effort is normal. No respiratory distress.     Breath sounds: Normal breath sounds. No wheezing.  Abdominal:     General: Bowel sounds are normal. There is no distension.     Palpations: Abdomen is  soft.     Tenderness: There is no abdominal tenderness.  Musculoskeletal:        General: No swelling, tenderness, deformity or signs of injury.     Right lower leg: No edema.     Left lower leg: No edema.     Comments: Contracture to the left hand   Lymphadenopathy:     Cervical: No cervical adenopathy.  Skin:    General: Skin is warm and dry.  Neurological:     General: No focal deficit present.     Mental Status: He is alert. Mental status is at baseline.  Psychiatric:        Mood and Affect: Mood normal.    Labs reviewed: Recent Labs    07/26/20 0000 08/12/20 2211 01/10/21 0000  NA 144 140 140  K 4.0 3.5 4.8  CL 107 106 104  CO2 23* 23 28*  GLUCOSE  --  144*  --   BUN 18 21 22*  CREATININE 0.7 0.84 1.0  CALCIUM 9.7 9.3 9.6   No results for input(s): AST, ALT, ALKPHOS, BILITOT, PROT, ALBUMIN in the last 8760 hours. Recent Labs    08/12/20 2211 01/10/21 0000  WBC 9.3 7.4  NEUTROABS 5.8  --   HGB 11.0* 11.7*  HCT 33.9* 36*  MCV 85.6  --   PLT 303 398   Lab Results  Component Value Date   TSH 3.14 01/18/2018   Lab Results  Component Value Date   HGBA1C 7.4 07/26/2020   Lab Results  Component Value Date   CHOL 127 05/11/2017   HDL 50 05/11/2017   LDLCALC 58 05/11/2017   TRIG 98 05/11/2017    Significant Diagnostic Results in last 30 days:  No results found.  Assessment/Plan  1. Weight loss Due to progressive dementia and dysphagia Goals of care are comfort based with no feeding tubes  Has glucerna ordered but does consume the whole container  2. Oropharyngeal dysphagia Continue D3 diet  Asp prec 1:1 sypervision   3. 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 Essential hypertension Controlled Continue norvasc and telmisartan  5. Pressure injury of coccygeal region, stage 2 (HCC) Improving Continue pressure relief  and dressing changes   6. Benign prostatic hyperplasia with urinary retention Has foley  No current symptoms   7. Chronic indwelling Foley catheter Followed by urology Continue cranberry supplements per family request

## 2021-05-20 DIAGNOSIS — R6889 Other general symptoms and signs: Secondary | ICD-10-CM | POA: Diagnosis not present

## 2021-05-20 DIAGNOSIS — Z743 Need for continuous supervision: Secondary | ICD-10-CM | POA: Diagnosis not present

## 2021-05-20 DIAGNOSIS — R404 Transient alteration of awareness: Secondary | ICD-10-CM | POA: Diagnosis not present

## 2021-05-23 ENCOUNTER — Non-Acute Institutional Stay (SKILLED_NURSING_FACILITY): Admitting: Internal Medicine

## 2021-05-23 ENCOUNTER — Encounter: Payer: Self-pay | Admitting: Internal Medicine

## 2021-05-23 DIAGNOSIS — E119 Type 2 diabetes mellitus without complications: Secondary | ICD-10-CM

## 2021-05-23 DIAGNOSIS — Z978 Presence of other specified devices: Secondary | ICD-10-CM

## 2021-05-23 DIAGNOSIS — I10 Essential (primary) hypertension: Secondary | ICD-10-CM | POA: Diagnosis not present

## 2021-05-23 DIAGNOSIS — R1312 Dysphagia, oropharyngeal phase: Secondary | ICD-10-CM | POA: Diagnosis not present

## 2021-05-23 DIAGNOSIS — G309 Alzheimer's disease, unspecified: Secondary | ICD-10-CM

## 2021-05-23 DIAGNOSIS — F0150 Vascular dementia without behavioral disturbance: Secondary | ICD-10-CM

## 2021-05-23 DIAGNOSIS — R634 Abnormal weight loss: Secondary | ICD-10-CM | POA: Diagnosis not present

## 2021-05-23 DIAGNOSIS — F0280 Dementia in other diseases classified elsewhere without behavioral disturbance: Secondary | ICD-10-CM

## 2021-05-23 NOTE — Progress Notes (Signed)
Location:   [facility] Room Number: [place] of Service:  SNF [phone]) Provider:  Veleta Miners MD  Virgie Dad, MD  Patient Care Team: Virgie Dad, MD as PCP - General (Internal Medicine) Danella Sensing, MD as Consulting Physician (Dermatology) Sharyne Peach, MD as Consulting Physician (Ophthalmology) Irine Seal, MD as Attending Physician (Urology) [name], MD as Consulting Physician (Neurology)  Extended Emergency Contact Information Primary Emergency Contact: Seel,[name] Address: [id number]          [place] [address] [name] of [place] Phone: [phone] Relation: Spouse Secondary Emergency Contact: [name] [place] of [place] Mobile Phone: [phone] Relation: Daughter  Code Status:  DNRHospice Goals of care: Advanced Directive information Advanced Directives 05/23/2021  Does Patient Have a Medical Advance Directive? Yes  Type of Paramedic of [place];Living will;Out of facility DNR (pink MOST or yellow form)  Does patient want to make changes to medical advance directive? No - Patient declined  Copy of [place] in Chart? Yes - validated most recent copy scanned in chart (See row information)  Would patient like information on creating a medical advance directive? -  Pre-existing out of facility DNR order (yellow form or pink MOST form) Yellow form placed in chart (order not valid for inpatient use)     Chief Complaint  Patient presents with   Medical Management of Chronic Issues   Health Maintenance    Hemoglobin A1C, flu shot, #4 covid    HPI:  Pt is a 87 y.o. male seen today for medical management of chronic diseases.    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, Progressive dementia, Hypertension Chronic Foley Catheter  due to BPH Depression H/o Type 2 Diabetes Not on any meds  Patient has lost Significant amount  of weight recently Almost 15 pounds since my last visit Patient is unable to give much history He has aphasia and Dementia Per Nurses they have to use Haldol sometimes to help his Moaning No Other issues Mostly Wheelchair bound   Past Medical History:  Diagnosis Date   Abnormality of gait    Arthritis    Brain bleed ([place]) 09/01/2016   Cervical spondylosis    Controlled type 2 diabetes mellitus without complication, without long-term current use of insulin ([place]) 06/24/2019   Degenerative joint disease (DJD) of lumbar spine    Dementia ([place]) 04/28/2019   Depression    Diplopia    Dyslipidemia    Essential tremor    Foul smelling urine    Frequent falls    GERD (gastroesophageal reflux disease)    Glycosuria    Hearing difficulty    hearing aids   Hyperlipidemia    Hypertension    Hyperthyroidism    Memory loss    Osteoarthritis    Radiculopathy, lumbar region    Sixth nerve palsy    last  left brain 11/2006  02/1998 08/2002   Small vessel disease ([place])    TIA (transient ischemic attack)    Past Surgical History:  Procedure Laterality Date   APPENDECTOMY     done as a child   GALLBLADDER SURGERY  2008   KNEE ARTHROSCOPY Left    Dr. Hart Robinsons 2002   knee injections Right    Dr. Adriana Mccallum   TONSILLECTOMY     done as a child    No Known Allergies  Allergies as of 05/23/2021   No Known Allergies  Medication List        Accurate as of May 23, 2021  1:29 PM. If you have any questions, ask your nurse or doctor.          acetaminophen 500 MG tablet Commonly known as: TYLENOL Take 500 mg by mouth 2 (two) times daily.   amLODipine 10 MG tablet Commonly known as: NORVASC Take 1 tablet (10 mg total) by mouth daily.   ammonium lactate 12 % cream Commonly known as: AMLACTIN Apply 1 Bottle topically at bedtime.   carbidopa-levodopa 25-100 MG tablet Commonly known as: SINEMET IR Take 1.5 tablets by mouth 3 (three) times daily.    carboxymethylcellulose 0.5 % Soln Commonly known as: REFRESH PLUS Apply 2 drops to eye every 4 (four) hours as needed (dry eyes).   clopidogrel 75 MG tablet Commonly known as: PLAVIX Take 75 mg by mouth daily.   Cranberry 400 MG Caps Take 2 capsules by mouth 3 (three) times daily.   haloperidol 0.5 MG tablet Commonly known as: HALDOL Take 0.5 mg by mouth every 4 (four) hours as needed for agitation.   hydrALAZINE 25 MG tablet Commonly known as: APRESOLINE Take 25 mg by mouth daily as needed. SBP>160   ipratropium-albuterol 0.5-2.5 (3) MG/3ML Soln Commonly known as: DUONEB Take 3 mLs by nebulization every 8 (eight) hours as needed.   lidocaine 2 % jelly Commonly known as: XYLOCAINE 1 application as needed. To tip of penis before cath care.   Menthol (Topical Analgesic) 4 % Gel Apply 1 application topically 2 (two) times daily as needed (pain).   methocarbamol 500 MG tablet Commonly known as: ROBAXIN Take 500 mg by mouth 2 (two) times daily.   OCUSOFT EYE [place] OP Apply 1 application to eye daily.   ondansetron 4 MG tablet Commonly known as: ZOFRAN Take 4 mg by mouth every 8 (eight) hours as needed for nausea or vomiting.   polyethylene glycol 17 g packet Commonly known as: MIRALAX / GLYCOLAX Take 17 g by mouth every other day.   senna-docusate 8.6-50 MG tablet Commonly known as: Senokot-S Take 2 tablets 2 (two) times daily by mouth.   sertraline 50 MG tablet Commonly known as: ZOLOFT Take 50 mg by mouth at bedtime.   sodium fluoride 1.1 % Gel dental gel Commonly known as: FLUORISHIELD Place 1 application onto teeth at bedtime.   telmisartan 20 MG tablet Commonly known as: MICARDIS Take 20 mg by mouth daily.   triamcinolone 55 MCG/ACT Aero nasal inhaler Commonly known as: NASACORT Place 2 sprays into the nose at bedtime.        Review of Systems  Unable to perform ROS: Dementia   Immunization History  Administered Date(s) Administered    Influenza Inj Mdck Quad Pf 08/09/2017   Influenza, High Dose Seasonal PF 07/31/2016, 07/31/2019   Influenza,inj,Quad PF,6+ Mos 08/06/2018   Influenza-Unspecified 08/01/2010, 07/30/2012, 10/16/2012, 10/16/2013, 08/06/2020   Moderna Sars-Covid-2 Vaccination 11/18/2019, 02/23/2020, 08/26/2020   Pneumococcal Polysaccharide-23 02/23/2004, 02/21/2013   Pneumococcal-Unspecified 03/17/2016   Td 12/02/2008   Tdap 09/01/2016   Zoster Recombinat (Shingrix) 10/29/2017, 01/24/2018   Zoster, Live 03/28/2006   Pertinent  Health Maintenance Due  Topic Date Due   HEMOGLOBIN A1C  01/24/2021   INFLUENZA VACCINE  05/16/2021   FOOT EXAM  11/22/2021   PNA vac Low Risk Adult  Completed   OPHTHALMOLOGY EXAM  Discontinued   Fall Risk  04/09/2020 09/06/2019 01/04/2019 07/17/2018 08/21/2017  Falls in the past year? 0 1 Exclusion - non ambulatory No  No  Number falls in past yr: 0 0 - - -  Injury with Fall? 0 0 - - -  Risk for fall due to : Impaired balance/gait History of fall(s) - - -  Follow up Falls evaluation completed Falls evaluation completed - - -   Functional Status Survey:    Vitals:   05/23/21 1319  BP: 133/77  Pulse: (!) 59  Resp: 18  Temp: (!) 97.3 F (36.3 C)  SpO2: 99%  Weight: 116 lb 12.8 oz (53 kg)  Height: 5' 8.5" (1.74 m)   Body mass index is 17.5 kg/m. Physical Exam Vitals reviewed.  Constitutional:      Comments: More responsive today but has aphasia  HENT:     Head: Normocephalic.     Nose: Nose normal.     Mouth/Throat:     Mouth: Mucous membranes are moist.     Pharynx: Oropharynx is clear.  Eyes:     Pupils: Pupils are equal, round, and reactive to light.  Cardiovascular:     Rate and Rhythm: Normal rate and regular rhythm.     Pulses: Normal pulses.     Heart sounds: Normal heart sounds.  Pulmonary:     Effort: Pulmonary effort is normal.     Breath sounds: Normal breath sounds.  Abdominal:     General: Abdomen is flat. Bowel sounds are normal.      Palpations: Abdomen is soft.  Musculoskeletal:        General: No swelling.     Cervical back: Neck supple.  Skin:    General: Skin is warm.  Neurological:     Mental Status: He is alert.     Comments: Does not follow much Commands  Psychiatric:        Mood and Affect: Mood normal.        Thought Content: Thought content normal.    Labs reviewed: Recent Labs    07/26/20 0000 08/12/20 2211 01/10/21 0000  NA 144 140 140  K 4.0 3.5 4.8  CL 107 106 104  CO2 23* 23 28*  GLUCOSE  --  144*  --   BUN 18 21 22*  CREATININE 0.7 0.84 1.0  CALCIUM 9.7 9.3 9.6   No results for input(s): AST, ALT, ALKPHOS, BILITOT, PROT, ALBUMIN in the last 8760 hours. Recent Labs    08/12/20 2211 01/10/21 0000  WBC 9.3 7.4  NEUTROABS 5.8  --   HGB 11.0* 11.7*  HCT 33.9* 36*  MCV 85.6  --   PLT 303 398   Lab Results  Component Value Date   TSH 3.14 01/18/2018   Lab Results  Component Value Date   HGBA1C 7.4 07/26/2020   Lab Results  Component Value Date   CHOL 127 05/11/2017   HDL 50 05/11/2017   LDLCALC 58 05/11/2017   TRIG 98 05/11/2017    Significant Diagnostic Results in last 30 days:  No results found.  Assessment/Plan 1. Weight loss Combination of his Dysphagia and Dementia Per Nurses he sometimes refuses to eat or open his mouth Enrolled in Hospice  2. Oropharyngeal dysphagia On D3 Diet   3. Mixed Alzheimer's and vascular dementia ([place]) End Stage Enrolled with hospice  4. Essential hypertension On Norvasc and Telmisartan Will change his Hydralazine to 10 mg PRN as 25 mg is dropping his BP  5. Chronic indwelling Foley catheter Follows with Urology   6. 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

## 2021-06-17 DIAGNOSIS — R29898 Other symptoms and signs involving the musculoskeletal system: Secondary | ICD-10-CM | POA: Diagnosis not present

## 2021-06-17 DIAGNOSIS — Z743 Need for continuous supervision: Secondary | ICD-10-CM | POA: Diagnosis not present

## 2021-06-17 DIAGNOSIS — R410 Disorientation, unspecified: Secondary | ICD-10-CM | POA: Diagnosis not present

## 2021-06-30 ENCOUNTER — Encounter: Payer: Self-pay | Admitting: Adult Health

## 2021-06-30 ENCOUNTER — Non-Acute Institutional Stay (SKILLED_NURSING_FACILITY): Payer: Medicare Other | Admitting: Adult Health

## 2021-06-30 DIAGNOSIS — G214 Vascular parkinsonism: Secondary | ICD-10-CM

## 2021-06-30 DIAGNOSIS — E119 Type 2 diabetes mellitus without complications: Secondary | ICD-10-CM | POA: Diagnosis not present

## 2021-06-30 DIAGNOSIS — F0150 Vascular dementia without behavioral disturbance: Secondary | ICD-10-CM

## 2021-06-30 DIAGNOSIS — G309 Alzheimer's disease, unspecified: Secondary | ICD-10-CM | POA: Diagnosis not present

## 2021-06-30 DIAGNOSIS — F4321 Adjustment disorder with depressed mood: Secondary | ICD-10-CM

## 2021-06-30 DIAGNOSIS — Z978 Presence of other specified devices: Secondary | ICD-10-CM

## 2021-06-30 DIAGNOSIS — R634 Abnormal weight loss: Secondary | ICD-10-CM

## 2021-06-30 DIAGNOSIS — F0280 Dementia in other diseases classified elsewhere without behavioral disturbance: Secondary | ICD-10-CM

## 2021-06-30 DIAGNOSIS — I10 Essential (primary) hypertension: Secondary | ICD-10-CM

## 2021-06-30 DIAGNOSIS — R1312 Dysphagia, oropharyngeal phase: Secondary | ICD-10-CM | POA: Diagnosis not present

## 2021-06-30 NOTE — Progress Notes (Signed)
Location:      Place of Service:    Provider:  Sharion Settler DNP student   Virgie Dad, MD  Patient Care Team: Virgie Dad, MD as PCP - General (Internal Medicine) Danella Sensing, MD as Consulting Physician (Dermatology) Sharyne Peach, MD as Consulting Physician (Ophthalmology) Irine Seal, MD as Attending Physician (Urology) [name], MD as Consulting Physician (Neurology)  Extended Emergency Contact Information Primary Emergency Contact: Maday,[name] Address: [id number]          [place] [address] [name] of [place] Phone: [phone] Relation: Spouse Secondary Emergency Contact: [place] of [place] Mobile Phone: [phone] Relation: Daughter  Code Status:  DNR Goals of care: Advanced Directive information Advanced Directives 05/23/2021  Does Patient Have a Medical Advance Directive? Yes  Type of Paramedic of [place];Living will;Out of facility DNR (pink MOST or yellow form)  Does patient want to make changes to medical advance directive? No - Patient declined  Copy of [place] in Chart? Yes - validated most recent copy scanned in chart (See row information)  Would patient like information on creating a medical advance directive? -  Pre-existing out of facility DNR order (yellow form or pink MOST form) Yellow form placed in chart (order not valid for inpatient use)     No chief complaint on file.   HPI:  Pt is a 87 y.o. male seen today for medical management of chronic diseases.    PMH includes mixed dementia, TIA, BPH with foley, HTN, brain bleed, DM II, depression, parkinsonism, weight loss, dysphagia, dyslipidemia, cerebrovascular diease and recurrent UTI.  Patient weight continues to decrease. Current weight 113.6 which is down from last weight of 116 lbs 05/2021.  Patient unable to give history due to having aphasia and dementia.   BP systolic ranging  [id number] weekly   Boots to bilateral feet in place for prevention of pressure injury.   Remains under hospice care      Past Medical History:  Diagnosis Date   Abnormality of gait    Arthritis    Brain bleed ([place]) 09/01/2016   Cervical spondylosis    Controlled type 2 diabetes mellitus without complication, without long-term current use of insulin ([place]) 06/24/2019   Degenerative joint disease (DJD) of lumbar spine    Dementia ([place]) 04/28/2019   Depression    Diplopia    Dyslipidemia    Essential tremor    Foul smelling urine    Frequent falls    GERD (gastroesophageal reflux disease)    Glycosuria    Hearing difficulty    hearing aids   Hyperlipidemia    Hypertension    Hyperthyroidism    Memory loss    Osteoarthritis    Radiculopathy, lumbar region    Sixth nerve palsy    last  left brain 11/2006  02/1998 08/2002   Small vessel disease ([place])    TIA (transient ischemic attack)    Past Surgical History:  Procedure Laterality Date   APPENDECTOMY     done as a child   GALLBLADDER SURGERY  2008   KNEE ARTHROSCOPY Left    Dr. Hart Robinsons 2002   knee injections Right    Dr. Adriana Mccallum   TONSILLECTOMY     done as a child    No Known Allergies  Outpatient Encounter Medications as of 06/30/2021  Medication Sig   acetaminophen (TYLENOL) 500 MG tablet Take 500 mg by mouth 2 (two) times daily.  amLODipine (NORVASC) 10 MG tablet Take 1 tablet (10 mg total) by mouth daily.   ammonium lactate (AMLACTIN) 12 % cream Apply 1 Bottle topically at bedtime.   carbidopa-levodopa (SINEMET IR) 25-100 MG tablet Take 1.5 tablets by mouth 3 (three) times daily.   carboxymethylcellulose (REFRESH PLUS) 0.5 % SOLN Apply 2 drops to eye every 4 (four) hours as needed (dry eyes).   clopidogrel (PLAVIX) 75 MG tablet Take 75 mg by mouth daily.   Cranberry 400 MG CAPS Take 2 capsules by mouth 3 (three) times daily.    haloperidol (HALDOL) 0.5 MG tablet Take 0.5 mg by mouth every 4 (four) hours  as needed for agitation.   hydrALAZINE (APRESOLINE) 25 MG tablet Take 10 mg by mouth daily as needed (for SBP >160). SBP>160   ipratropium-albuterol (DUONEB) 0.5-2.5 (3) MG/3ML SOLN Take 3 mLs by nebulization every 8 (eight) hours as needed.   lidocaine (XYLOCAINE) 2 % jelly 1 application as needed. To tip of penis before cath care.   Menthol, Topical Analgesic, 4 % GEL Apply 1 application topically 2 (two) times daily as needed (pain).    methocarbamol (ROBAXIN) 500 MG tablet Take 500 mg by mouth 2 (two) times daily.   ondansetron (ZOFRAN) 4 MG tablet Take 4 mg by mouth every 8 (eight) hours as needed for nausea or vomiting.   Ophthalmic Irrigation Solution (OCUSOFT EYE [place] OP) Apply 1 application to eye daily.   polyethylene glycol (MIRALAX / GLYCOLAX) packet Take 17 g by mouth every other day.   senna-docusate (SENOKOT-S) 8.6-50 MG tablet Take 2 tablets 2 (two) times daily by mouth.    sertraline (ZOLOFT) 50 MG tablet Take 50 mg by mouth at bedtime.   sodium fluoride (FLUORISHIELD) 1.1 % GEL dental gel Place 1 application onto teeth at bedtime.   telmisartan (MICARDIS) 20 MG tablet Take 20 mg by mouth daily.   triamcinolone (NASACORT) 55 MCG/ACT AERO nasal inhaler Place 2 sprays into the nose at bedtime.   No facility-administered encounter medications on file as of 06/30/2021.    Review of Systems  Reason unable to perform ROS: Dementia.   Immunization History  Administered Date(s) Administered   Influenza Inj Mdck Quad Pf 08/09/2017   Influenza, High Dose Seasonal PF 07/31/2016, 07/31/2019   Influenza,inj,Quad PF,6+ Mos 08/06/2018   Influenza-Unspecified 08/01/2010, 07/30/2012, 10/16/2012, 10/16/2013, 08/06/2020   Moderna Sars-Covid-2 Vaccination 11/18/2019, 02/23/2020, 08/26/2020   Pneumococcal Polysaccharide-23 02/23/2004, 02/21/2013   Pneumococcal-Unspecified 03/17/2016   Td 12/02/2008   Tdap 09/01/2016   Zoster Recombinat (Shingrix) 10/29/2017, 01/24/2018   Zoster, Live  03/28/2006   Pertinent  Health Maintenance Due  Topic Date Due   HEMOGLOBIN A1C  01/24/2021   INFLUENZA VACCINE  05/16/2021   FOOT EXAM  11/22/2021   PNA vac Low Risk Adult  Completed   OPHTHALMOLOGY EXAM  Discontinued   Fall Risk  04/09/2020 09/06/2019 01/04/2019 07/17/2018 08/21/2017  Falls in the past year? 0 1 Exclusion - non ambulatory No No  Number falls in past yr: 0 0 - - -  Injury with Fall? 0 0 - - -  Risk for fall due to : Impaired balance/gait History of fall(s) - - -  Follow up Falls evaluation completed Falls evaluation completed - - -   Functional Status Survey:    There were no vitals filed for this visit. There is no height or weight on file to calculate BMI. Physical Exam Constitutional:      General: He is not in acute distress. HENT:  Head: Normocephalic.     Mouth/Throat:     Mouth: Mucous membranes are moist.  Eyes:     Pupils: Pupils are equal, round, and reactive to light.  Cardiovascular:     Rate and Rhythm: Normal rate and regular rhythm.  Pulmonary:     Effort: Pulmonary effort is normal.     Breath sounds: Normal breath sounds.  Abdominal:     General: Abdomen is flat. Bowel sounds are normal.  Musculoskeletal:     Cervical back: Normal range of motion and neck supple.  Skin:    General: Skin is warm.  Neurological:     Comments: Does not follow commands   Psychiatric:        Mood and Affect: Mood normal.        Behavior: Behavior normal.    Labs reviewed: Recent Labs    07/26/20 0000 08/12/20 2211 01/10/21 0000  NA 144 140 140  K 4.0 3.5 4.8  CL 107 106 104  CO2 23* 23 28*  GLUCOSE  --  144*  --   BUN 18 21 22*  CREATININE 0.7 0.84 1.0  CALCIUM 9.7 9.3 9.6   No results for input(s): AST, ALT, ALKPHOS, BILITOT, PROT, ALBUMIN in the last 8760 hours. Recent Labs    08/12/20 2211 01/10/21 0000  WBC 9.3 7.4  NEUTROABS 5.8  --   HGB 11.0* 11.7*  HCT 33.9* 36*  MCV 85.6  --   PLT 303 398   Lab Results  Component Value  Date   TSH 3.14 01/18/2018   Lab Results  Component Value Date   HGBA1C 7.4 07/26/2020   Lab Results  Component Value Date   CHOL 127 05/11/2017   HDL 50 05/11/2017   LDLCALC 58 05/11/2017   TRIG 98 05/11/2017    Significant Diagnostic Results in last 30 days:  No results found.  Assessment/Plan 1. Weight loss Related to dysphagia and dementia Enrolled in hospice   2. Oropharyngeal dysphagia On D3 diet with thin liquids   3. Mixed Alzheimer's and vascular dementia ([place]) End stage enrolled in hospice   4. Essential hypertension On Norvasc, Telmisartan and PRN Hydralizine 10 mg    5. Chronic indwelling Foley catheter Urology following   6. Controlled type 2 diabetes mellitus without complication, without long-term current use of insulin (HCC) No on any medications  7. 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 Depression On low dose Zoloft    Family/ staff Communication:   Labs/tests ordered:  none

## 2021-08-01 ENCOUNTER — Encounter: Payer: Self-pay | Admitting: Adult Health

## 2021-08-01 ENCOUNTER — Non-Acute Institutional Stay (SKILLED_NURSING_FACILITY): Payer: Medicare Other | Admitting: Adult Health

## 2021-08-01 DIAGNOSIS — R451 Restlessness and agitation: Secondary | ICD-10-CM

## 2021-08-01 DIAGNOSIS — F0280 Dementia in other diseases classified elsewhere without behavioral disturbance: Secondary | ICD-10-CM

## 2021-08-01 DIAGNOSIS — R1312 Dysphagia, oropharyngeal phase: Secondary | ICD-10-CM | POA: Diagnosis not present

## 2021-08-01 DIAGNOSIS — L89152 Pressure ulcer of sacral region, stage 2: Secondary | ICD-10-CM

## 2021-08-01 DIAGNOSIS — R634 Abnormal weight loss: Secondary | ICD-10-CM

## 2021-08-01 DIAGNOSIS — G214 Vascular parkinsonism: Secondary | ICD-10-CM | POA: Diagnosis not present

## 2021-08-01 DIAGNOSIS — G309 Alzheimer's disease, unspecified: Secondary | ICD-10-CM

## 2021-08-01 DIAGNOSIS — F0150 Vascular dementia without behavioral disturbance: Secondary | ICD-10-CM

## 2021-08-01 NOTE — Progress Notes (Signed)
Location:  Occupational psychologist of Service:  SNF (31) Provider:   Cindi Carbon, [place] [phone]   Virgie Dad, MD  Patient Care Team: Virgie Dad, MD as PCP - General (Internal Medicine) Danella Sensing, MD as Consulting Physician (Dermatology) Sharyne Peach, MD as Consulting Physician (Ophthalmology) Irine Seal, MD as Attending Physician (Urology) [name], MD as Consulting Physician (Neurology)  Extended Emergency Contact Information Primary Emergency Contact: Wittwer,[name] Address: [address]          [place] [address] [name] Phone: [phone] Relation: Spouse Secondary Emergency Contact: [name] [place] of [place] Mobile Phone: [phone] Relation: Daughter  Code Status:  DNR Goals of care: Advanced Directive information Advanced Directives 05/23/2021  Does Patient Have a Medical Advance Directive? Yes  Type of Paramed[facility] of [place];Living will;Out of facility DNR (pink MOST or yellow form)  Does patient want to make changes to medical advance directive? No - Patient declined  Copy of [place] in Chart? Yes - validated most recent copy scanned in chart (See row information)  Would patient like information on creating a medical advance directive? -  Pre-existing out of facility DNR order (yellow form or pink MOST form) Yellow form placed in chart (order not valid for inpatient use)     Chief Complaint  Patient presents with   Medical Management of Chronic Issues    HPI:  Pt is a 87 y.o. male seen today for medical management of chronic diseases.    PMH includes mixed dementia, TIA, BPH with foley, HTN, brain bleed, DM II, depression, parkinsonism, weight loss, dysphagia, dyslipidemia, cerebrovascular diease and recurrent UTI.  Nurse reports that he has had very little intake. Spending most of the day in bed. Has  foley in place 100-200 cc output per day.   Followed by hospice.   Currently using morphine for groaning, discomfort and agitation as needed. Pain and agitation are controlled.   Continues to lose weight  Downgraded to D1 diet. Pills are crushed.   Has stage 2 pressure injury to his bottom.   Past Medical History:  Diagnosis Date   Abnormality of gait    Arthritis    Brain bleed ([place]) 09/01/2016   Cervical spondylosis    Controlled type 2 diabetes mellitus without complication, without long-term current use of insulin ([place]) 06/24/2019   Degenerative joint disease (DJD) of lumbar spine    Dementia ([place]) 04/28/2019   Depression    Diplopia    Dyslipidemia    Essential tremor    Foul smelling urine    Frequent falls    GERD (gastroesophageal reflux disease)    Glycosuria    Hearing difficulty    hearing aids   Hyperlipidemia    Hypertension    Hyperthyroidism    Memory loss    Osteoarthritis    Radiculopathy, lumbar region    Sixth nerve palsy    last  left brain 11/2006  02/1998 08/2002   Small vessel disease ([place])    TIA (transient ischemic attack)    Past Surgical History:  Procedure Laterality Date   APPENDECTOMY     done as a child   GALLBLADDER SURGERY  2008   KNEE ARTHROSCOPY Left    Dr. Hart Robinsons 2002   knee injections Right    Dr. Adriana Mccallum   TONSILLECTOMY     done as a child    No Known  Allergies  Outpatient Encounter Medications as of 08/01/2021  Medication Sig   haloperidol (HALDOL) 0.5 MG tablet Take 0.5 mg by mouth 2 (two) times daily.   morphine (ROXANOL) 20 MG/ML concentrated solution Take by mouth every 6 (six) hours as needed for severe pain.   carbidopa-levodopa (SINEMET IR) 25-100 MG tablet Take 1.5 tablets by mouth 3 (three) times daily.   carboxymethylcellulose (REFRESH PLUS) 0.5 % SOLN Apply 2 drops to eye every 4 (four) hours as needed (dry eyes).   ipratropium-albuterol (DUONEB) 0.5-2.5 (3) MG/3ML SOLN Take 3 mLs by nebulization  every 8 (eight) hours as needed.   lidocaine (XYLOCAINE) 2 % jelly 1 application as needed. To tip of penis before cath care.   ondansetron (ZOFRAN) 4 MG tablet Take 4 mg by mouth every 8 (eight) hours as needed for nausea or vomiting.   sodium fluoride (FLUORISHIELD) 1.1 % GEL dental gel Place 1 application onto teeth at bedtime.   [DISCONTINUED] acetaminophen (TYLENOL) 500 MG tablet Take 500 mg by mouth 2 (two) times daily.   [DISCONTINUED] amLODipine (NORVASC) 10 MG tablet Take 1 tablet (10 mg total) by mouth daily.   [DISCONTINUED] ammonium lactate (AMLACTIN) 12 % cream Apply 1 Bottle topically at bedtime.   [DISCONTINUED] clopidogrel (PLAVIX) 75 MG tablet Take 75 mg by mouth daily.   [DISCONTINUED] Cranberry 400 MG CAPS Take 2 capsules by mouth 3 (three) times daily.    [DISCONTINUED] haloperidol (HALDOL) 0.5 MG tablet Take 0.5 mg by mouth every 4 (four) hours as needed for agitation.   [DISCONTINUED] hydrALAZINE (APRESOLINE) 25 MG tablet Take 10 mg by mouth daily as needed (for SBP >160). SBP>160   [DISCONTINUED] Menthol, Topical Analgesic, 4 % GEL Apply 1 application topically 2 (two) times daily as needed (pain).    [DISCONTINUED] methocarbamol (ROBAXIN) 500 MG tablet Take 500 mg by mouth 2 (two) times daily.   [DISCONTINUED] Ophthalmic Irrigation Solution (OCUSOFT EYE [place] OP) Apply 1 application to eye daily.   [DISCONTINUED] polyethylene glycol (MIRALAX / GLYCOLAX) packet Take 17 g by mouth every other day.   [DISCONTINUED] senna-docusate (SENOKOT-S) 8.6-50 MG tablet Take 2 tablets 2 (two) times daily by mouth.    [DISCONTINUED] sertraline (ZOLOFT) 50 MG tablet Take 50 mg by mouth at bedtime.   [DISCONTINUED] telmisartan (MICARDIS) 20 MG tablet Take 20 mg by mouth daily.   [DISCONTINUED] triamcinolone (NASACORT) 55 MCG/ACT AERO nasal inhaler Place 2 sprays into the nose at bedtime.   No facility-administered encounter medications on file as of 08/01/2021.    Review of Systems   Unable to perform ROS: Dementia   Immunization History  Administered Date(s) Administered   Influenza Inj Mdck Quad Pf 08/09/2017   Influenza, High Dose Seasonal PF 07/31/2016, 07/31/2019   Influenza,inj,Quad PF,6+ Mos 08/06/2018   Influenza-Unspecified 08/01/2010, 07/30/2012, 10/16/2012, 10/16/2013, 08/06/2020   Moderna Sars-Covid-2 Vaccination 11/18/2019, 02/23/2020, 08/26/2020   Pneumococcal Polysaccharide-23 02/23/2004, 02/21/2013   Pneumococcal-Unspecified 03/17/2016   Td 12/02/2008   Tdap 09/01/2016   Zoster Recombinat (Shingrix) 10/29/2017, 01/24/2018   Zoster, Live 03/28/2006   Pertinent  Health Maintenance Due  Topic Date Due   HEMOGLOBIN A1C  01/24/2021   INFLUENZA VACCINE  05/16/2021   FOOT EXAM  11/22/2021   OPHTHALMOLOGY EXAM  Discontinued   Fall Risk  04/09/2020 09/06/2019 01/04/2019 07/17/2018 08/21/2017  Falls in the past year? 0 1 Exclusion - non ambulatory No No  Number falls in past yr: 0 0 - - -  Injury with Fall? 0 0 - - -  Risk for fall due to : Impaired balance/gait History of fall(s) - - -  Follow up Falls evaluation completed Falls evaluation completed - - -   Functional Status Survey:    Vitals:   08/01/21 1125  Weight: 113 lb 9.6 oz (51.5 kg)   Body mass index is 17.02 kg/m. Physical Exam Vitals and nursing note reviewed.  Constitutional:      General: He is not in acute distress.    Appearance: He is not diaphoretic.     Comments: Asleep, frail thin   HENT:     Head: Normocephalic and atraumatic.  Neck:     Thyroid: No thyromegaly.     Vascular: No JVD.     Trachea: No tracheal deviation.  Cardiovascular:     Rate and Rhythm: Normal rate and regular rhythm.     Heart sounds: No murmur heard. Pulmonary:     Effort: Pulmonary effort is normal. No respiratory distress.     Breath sounds: Rhonchi present. No wheezing.  Abdominal:     General: Bowel sounds are normal. There is no distension.     Palpations: Abdomen is soft.      Tenderness: There is no abdominal tenderness.  Musculoskeletal:     Right lower leg: No edema.     Left lower leg: No edema.  Lymphadenopathy:     Cervical: No cervical adenopathy.  Skin:    General: Skin is warm and dry.     Coloration: Skin is pale.  Neurological:     Comments: Asleep arouses to physical stim. Not able to verbalize or f/c    Labs reviewed: Recent Labs    08/12/20 2211 01/10/21 0000  NA 140 140  K 3.5 4.8  CL 106 104  CO2 23 28*  GLUCOSE 144*  --   BUN 21 22*  CREATININE 0.84 1.0  CALCIUM 9.3 9.6   No results for input(s): AST, ALT, ALKPHOS, BILITOT, PROT, ALBUMIN in the last 8760 hours. Recent Labs    08/12/20 2211 01/10/21 0000  WBC 9.3 7.4  NEUTROABS 5.8  --   HGB 11.0* 11.7*  HCT 33.9* 36*  MCV 85.6  --   PLT 303 398   Lab Results  Component Value Date   TSH 3.14 01/18/2018   Lab Results  Component Value Date   HGBA1C 7.4 07/26/2020   Lab Results  Component Value Date   CHOL 127 05/11/2017   HDL 50 05/11/2017   LDLCALC 58 05/11/2017   TRIG 98 05/11/2017    Significant Diagnostic Results in last 30 days:  No results found.  Assessment/Plan  1. Weight loss Due to progressive dementia/dysphagia Nearing end of life Followed by hospice.   2. Pressure injury of coccygeal region, stage 2 ([place]) Aquacel dressing changes M W F per WS  3. Mixed Alzheimer's and vascular dementia ([place]) Severe, nearing end stage  4. Oropharyngeal dysphagia Puree diet with thin liquids.  Very little intake today  5. Agitation Controlled with scheduled haldol. Also using morphine for agitation/perceived pain.   6. Parkinsonism Continues on Sinemet until he is no longer able to swallow it to help with personal care/rigidity    Family/ staff Communication: nurse  Labs/tests ordered:  NA

## 2021-08-16 DEATH — deceased
